# Patient Record
Sex: Female | Born: 1950 | Race: Black or African American | Hispanic: No | Smoking: Never smoker
Health system: Southern US, Community
[De-identification: ages and names within clinical notes are randomized; demographics above are authoritative.]

## PROBLEM LIST (undated history)

## (undated) DIAGNOSIS — I447 Left bundle-branch block, unspecified: Secondary | ICD-10-CM

## (undated) DIAGNOSIS — Z90710 Acquired absence of both cervix and uterus: Secondary | ICD-10-CM

## (undated) DIAGNOSIS — I509 Heart failure, unspecified: Secondary | ICD-10-CM

## (undated) DIAGNOSIS — I10 Essential (primary) hypertension: Secondary | ICD-10-CM

## (undated) HISTORY — DX: Acquired absence of both cervix and uterus: Z90.710

## (undated) HISTORY — DX: Left bundle-branch block, unspecified: I44.7

## (undated) HISTORY — PX: ABDOMINAL HYSTERECTOMY: SHX81

## (undated) HISTORY — PX: TUBAL LIGATION: SHX77

---

## 2001-04-05 ENCOUNTER — Ambulatory Visit (HOSPITAL_COMMUNITY): Admission: RE | Admit: 2001-04-05 | Discharge: 2001-04-05 | Payer: Self-pay | Admitting: *Deleted

## 2001-04-05 ENCOUNTER — Encounter: Payer: Self-pay | Admitting: *Deleted

## 2001-04-08 ENCOUNTER — Encounter: Payer: Self-pay | Admitting: *Deleted

## 2001-04-08 ENCOUNTER — Ambulatory Visit (HOSPITAL_COMMUNITY): Admission: RE | Admit: 2001-04-08 | Discharge: 2001-04-08 | Payer: Self-pay | Admitting: *Deleted

## 2002-01-18 ENCOUNTER — Emergency Department (HOSPITAL_COMMUNITY): Admission: EM | Admit: 2002-01-18 | Discharge: 2002-01-18 | Payer: Self-pay | Admitting: Emergency Medicine

## 2002-08-04 ENCOUNTER — Encounter: Payer: Self-pay | Admitting: *Deleted

## 2002-08-04 ENCOUNTER — Ambulatory Visit (HOSPITAL_COMMUNITY): Admission: RE | Admit: 2002-08-04 | Discharge: 2002-08-04 | Payer: Self-pay | Admitting: *Deleted

## 2003-01-01 ENCOUNTER — Emergency Department (HOSPITAL_COMMUNITY): Admission: EM | Admit: 2003-01-01 | Discharge: 2003-01-01 | Payer: Self-pay | Admitting: Emergency Medicine

## 2003-05-28 ENCOUNTER — Emergency Department (HOSPITAL_COMMUNITY): Admission: EM | Admit: 2003-05-28 | Discharge: 2003-05-28 | Payer: Self-pay

## 2003-10-21 HISTORY — PX: LAPAROSCOPIC CHOLECYSTECTOMY: SUR755

## 2004-02-14 ENCOUNTER — Emergency Department (HOSPITAL_COMMUNITY): Admission: EM | Admit: 2004-02-14 | Discharge: 2004-02-15 | Payer: Self-pay | Admitting: *Deleted

## 2004-06-12 ENCOUNTER — Emergency Department (HOSPITAL_COMMUNITY): Admission: EM | Admit: 2004-06-12 | Discharge: 2004-06-12 | Payer: Self-pay | Admitting: Emergency Medicine

## 2004-06-28 ENCOUNTER — Ambulatory Visit (HOSPITAL_COMMUNITY): Admission: RE | Admit: 2004-06-28 | Discharge: 2004-06-28 | Payer: Self-pay | Admitting: General Surgery

## 2005-01-24 ENCOUNTER — Ambulatory Visit (HOSPITAL_COMMUNITY): Admission: RE | Admit: 2005-01-24 | Discharge: 2005-01-24 | Payer: Self-pay | Admitting: General Surgery

## 2005-04-01 ENCOUNTER — Ambulatory Visit (HOSPITAL_COMMUNITY): Admission: RE | Admit: 2005-04-01 | Discharge: 2005-04-01 | Payer: Self-pay | Admitting: General Surgery

## 2006-06-19 ENCOUNTER — Emergency Department (HOSPITAL_COMMUNITY): Admission: EM | Admit: 2006-06-19 | Discharge: 2006-06-19 | Payer: Self-pay | Admitting: Emergency Medicine

## 2006-12-29 ENCOUNTER — Emergency Department (HOSPITAL_COMMUNITY): Admission: EM | Admit: 2006-12-29 | Discharge: 2006-12-30 | Payer: Self-pay | Admitting: Emergency Medicine

## 2007-10-21 HISTORY — PX: ROTATOR CUFF REPAIR: SHX139

## 2008-02-01 ENCOUNTER — Emergency Department (HOSPITAL_COMMUNITY): Admission: EM | Admit: 2008-02-01 | Discharge: 2008-02-01 | Payer: Self-pay | Admitting: Emergency Medicine

## 2008-02-01 ENCOUNTER — Ambulatory Visit (HOSPITAL_COMMUNITY): Admission: RE | Admit: 2008-02-01 | Discharge: 2008-02-01 | Payer: Self-pay | Admitting: Obstetrics & Gynecology

## 2008-02-14 ENCOUNTER — Ambulatory Visit: Payer: Self-pay | Admitting: Orthopedic Surgery

## 2008-02-23 ENCOUNTER — Encounter: Payer: Self-pay | Admitting: Orthopedic Surgery

## 2008-05-15 ENCOUNTER — Encounter: Payer: Self-pay | Admitting: Orthopedic Surgery

## 2008-05-17 ENCOUNTER — Ambulatory Visit: Payer: Self-pay | Admitting: Orthopedic Surgery

## 2008-05-18 ENCOUNTER — Encounter: Payer: Self-pay | Admitting: Orthopedic Surgery

## 2008-05-19 ENCOUNTER — Encounter: Payer: Self-pay | Admitting: Orthopedic Surgery

## 2008-05-22 ENCOUNTER — Encounter: Payer: Self-pay | Admitting: Orthopedic Surgery

## 2008-06-02 ENCOUNTER — Encounter: Payer: Self-pay | Admitting: Orthopedic Surgery

## 2008-06-14 ENCOUNTER — Ambulatory Visit: Payer: Self-pay | Admitting: Orthopedic Surgery

## 2008-06-20 ENCOUNTER — Telehealth: Payer: Self-pay | Admitting: Orthopedic Surgery

## 2008-06-22 ENCOUNTER — Ambulatory Visit (HOSPITAL_COMMUNITY): Admission: RE | Admit: 2008-06-22 | Discharge: 2008-06-22 | Payer: Self-pay | Admitting: Orthopedic Surgery

## 2008-06-22 ENCOUNTER — Telehealth: Payer: Self-pay | Admitting: Orthopedic Surgery

## 2008-07-05 ENCOUNTER — Ambulatory Visit: Payer: Self-pay | Admitting: Orthopedic Surgery

## 2008-07-11 ENCOUNTER — Ambulatory Visit: Payer: Self-pay | Admitting: Orthopedic Surgery

## 2008-07-11 ENCOUNTER — Ambulatory Visit (HOSPITAL_COMMUNITY): Admission: RE | Admit: 2008-07-11 | Discharge: 2008-07-11 | Payer: Self-pay | Admitting: Orthopedic Surgery

## 2008-07-13 ENCOUNTER — Ambulatory Visit: Payer: Self-pay | Admitting: Orthopedic Surgery

## 2008-07-14 ENCOUNTER — Encounter (HOSPITAL_COMMUNITY): Admission: RE | Admit: 2008-07-14 | Discharge: 2008-07-19 | Payer: Self-pay | Admitting: Orthopedic Surgery

## 2008-07-14 ENCOUNTER — Encounter: Payer: Self-pay | Admitting: Orthopedic Surgery

## 2008-07-21 ENCOUNTER — Encounter (HOSPITAL_COMMUNITY): Admission: RE | Admit: 2008-07-21 | Discharge: 2008-08-20 | Payer: Self-pay | Admitting: Orthopedic Surgery

## 2008-07-24 ENCOUNTER — Encounter: Payer: Self-pay | Admitting: Orthopedic Surgery

## 2008-08-07 ENCOUNTER — Ambulatory Visit: Payer: Self-pay | Admitting: Orthopedic Surgery

## 2008-08-09 ENCOUNTER — Encounter: Payer: Self-pay | Admitting: Orthopedic Surgery

## 2008-09-11 ENCOUNTER — Ambulatory Visit: Payer: Self-pay | Admitting: Orthopedic Surgery

## 2008-10-11 ENCOUNTER — Ambulatory Visit: Payer: Self-pay | Admitting: Orthopedic Surgery

## 2008-10-26 ENCOUNTER — Encounter: Payer: Self-pay | Admitting: Orthopedic Surgery

## 2008-11-01 ENCOUNTER — Encounter: Payer: Self-pay | Admitting: Orthopedic Surgery

## 2009-08-03 ENCOUNTER — Ambulatory Visit (HOSPITAL_COMMUNITY): Admission: RE | Admit: 2009-08-03 | Discharge: 2009-08-03 | Payer: Self-pay | Admitting: Family Medicine

## 2010-05-29 ENCOUNTER — Encounter (HOSPITAL_COMMUNITY): Admission: RE | Admit: 2010-05-29 | Discharge: 2010-05-30 | Payer: Self-pay | Admitting: Family Medicine

## 2010-11-14 ENCOUNTER — Emergency Department (HOSPITAL_COMMUNITY)
Admission: EM | Admit: 2010-11-14 | Discharge: 2010-11-14 | Payer: Self-pay | Source: Home / Self Care | Admitting: Emergency Medicine

## 2010-11-14 ENCOUNTER — Encounter (INDEPENDENT_AMBULATORY_CARE_PROVIDER_SITE_OTHER): Payer: Self-pay | Admitting: *Deleted

## 2010-11-14 LAB — URINALYSIS, ROUTINE W REFLEX MICROSCOPIC
Bilirubin Urine: NEGATIVE
Ketones, ur: NEGATIVE mg/dL
Urobilinogen, UA: 0.2 mg/dL (ref 0.0–1.0)

## 2010-11-14 LAB — CONVERTED CEMR LAB
CK-MB: 1.8 ng/mL
Chloride: 103 meq/L
Creatinine, Ser: 0.88 mg/dL
Eosinophils Absolute: 1 10*3/uL
Glomerular Filtration Rate, Af Am: >60 mL/min/{1.73_m2}
Glucose, Bld: 103 mg/dL
Hemoglobin: 13.3 g/dL
Lymphocytes Relative: 26 %
Lymphs Abs: 3.3 10*3/uL
MCV: 82.1 fL
Monocytes Absolute: 0.7 10*3/uL
Neutro Abs: 8.6 10*3/uL
Sodium: 139 meq/L
Troponin I: <0.05 ng/mL

## 2010-11-14 LAB — CBC
Platelets: 255 10*3/uL (ref 150–400)
RDW: 13 % (ref 11.5–15.5)

## 2010-11-14 LAB — POCT CARDIAC MARKERS
Myoglobin, poc: 83.3 ng/mL (ref 12–200)
Troponin i, poc: <0.05 ng/mL (ref 0.00–0.09)
Troponin i, poc: <0.05 ng/mL (ref 0.00–0.09)

## 2010-11-14 LAB — DIFFERENTIAL
Basophils Absolute: 0 10*3/uL (ref 0.0–0.1)
Basophils Relative: 0 % (ref 0–1)
Eosinophils Relative: 1 % (ref 0–5)
Lymphocytes Relative: 26 % (ref 12–46)
Lymphs Abs: 3.3 10*3/uL (ref 0.7–4.0)
Monocytes Absolute: 0.7 10*3/uL (ref 0.1–1.0)
Monocytes Relative: 5 % (ref 3–12)
Neutro Abs: 8.6 10*3/uL — ABNORMAL HIGH (ref 1.7–7.7)

## 2010-11-14 LAB — BASIC METABOLIC PANEL
CO2: 29 mEq/L (ref 19–32)
Calcium: 9.9 mg/dL (ref 8.4–10.5)
Chloride: 103 mEq/L (ref 96–112)
GFR calc Af Amer: >60 mL/min (ref >60)
Sodium: 139 mEq/L (ref 135–145)

## 2010-12-02 ENCOUNTER — Encounter (INDEPENDENT_AMBULATORY_CARE_PROVIDER_SITE_OTHER): Payer: Self-pay | Admitting: *Deleted

## 2010-12-03 ENCOUNTER — Encounter (INDEPENDENT_AMBULATORY_CARE_PROVIDER_SITE_OTHER): Payer: No Typology Code available for payment source | Admitting: Cardiology

## 2010-12-03 ENCOUNTER — Encounter: Payer: Self-pay | Admitting: Cardiology

## 2010-12-03 DIAGNOSIS — R079 Chest pain, unspecified: Secondary | ICD-10-CM

## 2010-12-03 DIAGNOSIS — G43909 Migraine, unspecified, not intractable, without status migrainosus: Secondary | ICD-10-CM | POA: Insufficient documentation

## 2010-12-04 ENCOUNTER — Encounter: Payer: Self-pay | Admitting: Cardiology

## 2010-12-11 NOTE — Assessment & Plan Note (Addendum)
Summary: [facility]   Visit Type:  Initial Consult Referring Provider:  Dr. Ephriam Knuckles Primary Provider:  [facility]   History of Present Illness: [name] is evaluated in the office today following a recent emergency department visit for chest discomfort.  She first was evaluated by her primary care physician for this problem approximately 4 months ago.  Rib films reportedly demonstrated the presence of fractures in 3 ribs, and conservative therapy was advised.  Pain continued to recur prompting a return visit to the office in January at which time she was referred to the emergency department.  Her discomfort was moderate in intensity, located over the left costal margin, unrelated to movement or exertion and associated with mild nausea.  Chest x-ray there showed no abnormalities.  Cardiac markers were negative.  Basic laboratory studies were unremarkable.  EKG showed left bundle branch block with no prior tracings for comparison.  Patient's psychological state was reviewed with her.  She reports some anhedonia.  She has no friends, attends religious services but does not mingle with others at her church, but denies decreased appetite, change in weight or a sleep disturbance.  She lives with an adult son, but does not see much of him.    Current Medications (verified): 1)  Lisinopril 40 Mg Tabs (Lisinopril) .... Take 1 Tab Daily 2)  Complete Womens  Tabs (Multiple Vitamins-Minerals) .... Take As Directed 3)  Fish Oil 300 Mg Caps (Omega-3 Fatty Acids) .... Take As Directed 4)  Folic Acid 1 Mg Tabs (Folic Acid) .... Take As Directed  Allergies (verified): No Known Drug Allergies  Comments:  Nurse/Medical Assistant: patient brought her lisinopril 40mg  daily all others are vit supplements that she doesnt take everyday [facility]  Past History:  Family History: Last updated: 12-17-2010 Father died at age 80 due to myocardial infarction Mother  died at age 81 due to CVA Siblings: Of 3 brothers, 2 are alive and one died as the result of an MI at age 53; 59 sisters, one died in childhood due to leukemia  Social History: Last updated: 17-Dec-2010 Patient is divorced. 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  Alcohol Use - no Regular Exercise - no Drug Use - no  Past Medical History: Chest pain: Left bundle branch block Hypertension Migraine headache  Past Surgical History: Laparoscopic cholecystectomy-2005 Hysterectomy Arthroscopic rotator cuff repair-2009  CT Scan  Procedure date:  01/2004  Findings:       CT OF THE ABDOMEN AND PELVIS - WITH CONTRAST - 02/15/04   IMPRESSION   1.   Cholelithiasis.  No CT evidence for acute cholecystitis.   2.   Scarring in the upper pole of the right kidney.  Small bilateral   renal cysts.   3.   Normal appearing appendix.   CT PELVIS:   The uterus is surgically absent.  The colon and the small bowel are   unremarkable.  The distal colon is decompressed.  There is no   significant lymphadenopathy.  There is no free fluid.   IMPRESSION   Normal CT of the pelvis, status post hysterectomy.   EKG  Procedure date:  12-17-10  Findings:      Normal sinus rhythm Left bundle branch block No change when compared to a previous tracing on 11/14/10   Family History: Father died at age 10 due to myocardial infarction Mother died at age 73 due to CVA Siblings: Of 3 brothers, 2 are alive and  one died as the result of an MI at age 58; 7 sisters, one died in childhood due to leukemia  Social History: Patient is divorced. 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  Alcohol Use - no Regular Exercise - no Drug Use - no  Review of Systems       migraine headache; corrective lenses required; occasional palpitations; arthritic discomfort in the hands and shoulders; all other systems reviewed and are negative.  Vital Signs:  Patient  profile:   60 year old female Height:      64 inches Weight:      147 pounds BMI:     25.32 O2 Sat:      97 % on Room air Pulse rate:   77 / minute BP sitting:   132 / 71  (left arm)  Vitals Entered By: Dreama Saa, CNA (December 03, 2010 1:58 PM)  O2 Flow:  Room air  Physical Exam  General:  Proportionate weight and height; well-developed; no acute distress: HEENT-[place]/AT; PERRL; EOM intact; conjunctiva and lids nl:  Neck-No JVD; no carotid bruits; no masses; no adenopathy Endocrine-No thyromegaly: Lungs-No tachypnea, clear without rales, rhonchi or wheezes: CV-normal PMI; normal S1 and S2; modest systolic ejection murmur Abdomen-BS normal; soft and non-tender without masses or organomegaly: MS-No deformities, cyanosis or clubbing: Neurologic-Nl cranial nerves; symmetric strength and tone: Skin- Warm, no sig. lesions: Extremities-Nl distal pulses; no edema    Impression & Recommendations:  Problem # 1:  CHEST PAIN (ICD-786.50) Chest pain is noncardiac, and the likelihood of coronary disease appears low.  The initial diagnosis of spontaneous fractures of multiple ribs also appears to be unlikely.  Films and results of prior testing performed at [facility] have been requested.  I will reassess this nice woman in one month in light of that information to determine if further testing is warranted.  Problem # 2:  Possible Depression History is suggestive, but not diagnostic.  Ms. Rubel appears to have inadequate social contact, and additional attention to her psychologic status may be productive.  Patient Instructions: 1)  Your physician recommends that you schedule a follow-up appointment in: 1 month  Appended Document: [facility] Records    Clinical Lists Changes  Problems: Added new problem of LEFT BUNDLE BRANCH BLOCK (ICD-426.3) Observations: Added new observation of PAST MED HX: Chest pain: Left bundle branch block-diagnosed  10/2010 Hypertension Migraine headache (12/16/2010 18:25) Added new observation of SOCIAL HX: Patient is divorced. 3 children Employment-clerk for the post [employer] Tobacco Use - Never  Alcohol Use - no Regular Exercise - no Drug Use - no  (12/16/2010 18:25) Added new observation of FAMILY HX: Father died at age 75 due to myocardial infarction Mother died at age 81 due to CVA; h/o hypertension Siblings: Of 3 brothers, 2 are alive and one died as the result of an MI at age 23; 53 sisters, one died in childhood due to leukemia  (12/16/2010 18:25) Added new observation of PAST SURG HX: Laparoscopic cholecystectomy-2005 Bilateral tubal ligation Hysterectomy Arthroscopic rotator cuff repair-2009 (12/16/2010 18:25) Added new observation of REFERRING MD: Dr. Ephriam Knuckles (12/16/2010 18:25) Added new observation of PRIMARY MD: [facility] (12/16/2010 18:25) Added new observation of SGPT (ALT): 29 units/L (05/21/2010 18:31) Added new observation of BG FASTING: 119 mg/dL (30/86/5784 69:62) Added new observation of TSH: 0.35 microintl units/mL (05/21/2010 18:31) Added new observation of HGBA1C: 6.2 % (05/21/2010 18:31) Added new observation of TRIGLYCERIDE: 96 mg/dL (95/28/4132  18:31) Added new observation of HDL: 50 mg/dL (04/54/0981 19:14) Added new observation of LDL (CALCUL): 172 mg/dL (78/29/5621 30:86) Added new observation of CHOLESTEROL: 241 mg/dL (57/84/6962 95:28) Added new observation of GFR: 72 mL/min (05/21/2010 18:27) Added new observation of ALBUMIN: 4.6 g/dL (41/32/4401 02:72) Added new observation of PROTEIN, TOT: 7.5 g/dL (53/66/4403 47:42) Added new observation of CALCIUM: 10.1 mg/dL (59/56/3875 64:33) Added new observation of ALK PHOS: 86 units/L (05/21/2010 18:27) Added new observation of BILI TOTAL: 0.5 mg/dL (29/51/8841 66:06) Added new observation of SGOT (AST): 22 units/L (05/21/2010 18:27) Added new observation of CO2 PLSM/SER: 24 meq/L (05/21/2010  18:27) Added new observation of CL SERUM: 106 meq/L (05/21/2010 18:27) Added new observation of K SERUM: 4.6 meq/L (05/21/2010 18:27) Added new observation of NA: 144 meq/L (05/21/2010 18:27) Added new observation of CREATININE: 0.88 mg/dL (30/16/0109 32:35) Added new observation of BUN:  13 mg/dL (57/32/2025 42:70)        -  Date:  05/21/2010    SGOT (AST): 22    T. Bilirubin: 0.5    Alk Phos: 86    Calcium: 10.1    Total Protein: 7.5    Albumin: 4.6    Cholesterol: 241    LDL-calculated: 172    HDL: 50    Triglycerides: 96    HgbA1c: 6.2    TSH: 0.35    FBS 119    SGPT (ALT): 29   Past History:  Past Medical History: Chest pain: Left bundle branch block-diagnosed 10/2010 Hypertension Migraine headache  Past Surgical History: Laparoscopic cholecystectomy-2005 Bilateral tubal ligation Hysterectomy Arthroscopic rotator cuff repair-2009    Family History: Father died at age 23 due to myocardial infarction Mother died at age 50 due to CVA; h/o hypertension Siblings: Of 3 brothers, 2 are alive and one died as the result of an MI at age 67; 7 sisters, one died in childhood due to leukemia  Social History: Patient is divorced. 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

## 2010-12-11 NOTE — Miscellaneous (Signed)
Summary: labs cbcd,bmp,11/14/2010  Clinical Lists Changes  Observations: Added new observation of TROPONIN I: <0.05 (11/14/2010 9:47) Added new observation of CK-MB ISOENZ: 1.8 ng/mL (11/14/2010 9:47) Added new observation of GFR AA: >60 mL/min/1.84m2 (11/14/2010 9:47) Added new observation of GFR: >60 mL/min (11/14/2010 9:47) Added new observation of CREATININE: 0.88 mg/dL (16/07/9603 5:40) Added new observation of BUN: 10 mg/dL (98/08/9146 8:29) Added new observation of BG RANDOM: 103 mg/dL (56/21/3086 5:78) Added new observation of CO2 PLSM/SER: 29 meq/L (11/14/2010 9:47) Added new observation of CL SERUM: 103 meq/L (11/14/2010 9:47) Added new observation of K SERUM: 3.9 meq/L (11/14/2010 9:47) Added new observation of NA: 139 meq/L (11/14/2010 9:47) Added new observation of ABSOLUTE BAS: 0.0 K/uL (11/14/2010 9:47) Added new observation of BASOPHIL %: 0 % (11/14/2010 9:47) Added new observation of EOS ABSLT: 1 K/uL (11/14/2010 9:47) Added new observation of ABSOLUTE MON: 0.7 K/uL (11/14/2010 9:47) Added new observation of MONOCYTE %: 5 % (11/14/2010 9:47) Added new observation of ABS LYMPHOCY: 3.3 K/uL (11/14/2010 9:47) Added new observation of LYMPHS %: 26 % (11/14/2010 9:47) Added new observation of ABS NEUTROPH: 8.6 K/uL (11/14/2010 9:47) Added new observation of PLATELETK/UL: 256 K/uL (11/14/2010 9:47) Added new observation of RDW: 13.0 % (11/14/2010 9:47) Added new observation of MCHC RBC: 34.9 g/dL (46/96/2952 8:41) Added new observation of MCV: 82.1 fL (11/14/2010 9:47) Added new observation of HCT: 38.1 % (11/14/2010 9:47) Added new observation of HGB: 13.3 g/dL (32/44/0102 7:25) Added new observation of RBC M/UL: 4.64 M/uL (11/14/2010 9:47) Added new observation of WBC COUNT: 12.7 10*3/microliter (11/14/2010 9:47)

## 2010-12-16 DIAGNOSIS — I447 Left bundle-branch block, unspecified: Secondary | ICD-10-CM | POA: Insufficient documentation

## 2010-12-18 ENCOUNTER — Encounter: Payer: Self-pay | Admitting: Cardiology

## 2011-01-02 ENCOUNTER — Encounter: Payer: Self-pay | Admitting: *Deleted

## 2011-01-02 ENCOUNTER — Other Ambulatory Visit: Payer: Self-pay | Admitting: Cardiology

## 2011-01-02 ENCOUNTER — Ambulatory Visit (INDEPENDENT_AMBULATORY_CARE_PROVIDER_SITE_OTHER): Payer: No Typology Code available for payment source | Admitting: Cardiology

## 2011-01-02 ENCOUNTER — Encounter: Payer: Self-pay | Admitting: Cardiology

## 2011-01-02 DIAGNOSIS — R079 Chest pain, unspecified: Secondary | ICD-10-CM

## 2011-01-02 DIAGNOSIS — R0602 Shortness of breath: Secondary | ICD-10-CM

## 2011-01-06 ENCOUNTER — Ambulatory Visit (HOSPITAL_COMMUNITY)
Admission: RE | Admit: 2011-01-06 | Discharge: 2011-01-06 | Disposition: A | Discharge status: Home / Self Care | Payer: No Typology Code available for payment source | Source: Ambulatory Visit | Attending: Cardiology | Admitting: Cardiology

## 2011-01-06 ENCOUNTER — Encounter (HOSPITAL_COMMUNITY): Payer: Self-pay

## 2011-01-06 ENCOUNTER — Encounter (HOSPITAL_COMMUNITY): Payer: No Typology Code available for payment source

## 2011-01-06 ENCOUNTER — Ambulatory Visit (HOSPITAL_COMMUNITY): Payer: No Typology Code available for payment source

## 2011-01-06 DIAGNOSIS — R0602 Shortness of breath: Secondary | ICD-10-CM | POA: Insufficient documentation

## 2011-01-06 DIAGNOSIS — E079 Disorder of thyroid, unspecified: Secondary | ICD-10-CM | POA: Insufficient documentation

## 2011-01-06 HISTORY — DX: Essential (primary) hypertension: I10

## 2011-01-06 MED ORDER — IOHEXOL 350 MG/ML SOLN
100.0000 mL | Freq: Once | INTRAVENOUS | Status: AC | PRN
  Administered 2011-01-06: 100 mL via INTRAVENOUS

## 2011-01-07 NOTE — Letter (Signed)
Summary: Work Writer at [employer]  [address] [place] [address]   Phone: [phone]  Fax: [phone]     January 02, 2011    Summer Christensen   The above named patient had a medical visit today at: 1:30     am   Please take this into consideration when reviewing the time away from work. Pt is scheduled for extensive testing on Monday 01/06/2011, she needs must have 12/19/2010 off to have testing done on 01/06/2011.    Sincerely yours,  Architectural technologist

## 2011-01-07 NOTE — Letter (Signed)
Summary: Stress Echocardiogram Information Sheet  [facility] at [facility]  [address] [place] [address]   Phone: [phone]  Fax: [phone]      January 02, 2011 MRN: [id number] light prior to the test.   Deidre Loiselle  Doctor: Appointment Date: Appointment Time: Appointment Location: [facility]  Stress Echocardiogram Information Sheet    Instructions:   1. DO NOT  take your AM medicine MORNING days before the test.  2. NOTHING TO EAT OR DRINK AFTER MIDNIGHT  3. Dress prepared to exercise.  4. DO NOT use ANY caffine or tobacco products 3 hours before appointment.  5. Report to the Short Stay Center on the1st floor.  6. Please bring all current prescription medications.  7. If you have any questions, please call [phone]

## 2011-01-08 ENCOUNTER — Ambulatory Visit (HOSPITAL_COMMUNITY)
Admission: RE | Admit: 2011-01-08 | Discharge: 2011-01-08 | Disposition: A | Discharge status: Home / Self Care | Payer: No Typology Code available for payment source | Source: Ambulatory Visit | Attending: Cardiology | Admitting: Cardiology

## 2011-01-08 ENCOUNTER — Ambulatory Visit (HOSPITAL_COMMUNITY): Payer: No Typology Code available for payment source

## 2011-01-08 ENCOUNTER — Other Ambulatory Visit: Payer: Self-pay | Admitting: Cardiology

## 2011-01-08 DIAGNOSIS — R079 Chest pain, unspecified: Secondary | ICD-10-CM | POA: Insufficient documentation

## 2011-01-08 DIAGNOSIS — R072 Precordial pain: Secondary | ICD-10-CM

## 2011-01-08 DIAGNOSIS — I10 Essential (primary) hypertension: Secondary | ICD-10-CM | POA: Insufficient documentation

## 2011-01-16 NOTE — Assessment & Plan Note (Signed)
Summary: 1 mth f/u per checkout on 12/03/10/tg   Visit Type:  Follow-up Referring Provider:  Dr. Ephriam Knuckles Primary Provider:  [facility]   History of Present Illness: [name] returns to the office as scheduled for continued assessment and treatment of chest discomfort.  Repeat rib films show no evidence of fracture; however, she has continued to have discomfort.  This is of mild to moderate severity with occasional sharp components affecting both costal margins and occurring on most days.  There is no relationship to exercise.  The discomfort is not pleuritic.  [name] also reports the recent development of exertional dyspnea.  She is unable to do her housework without having to rest due to air hunger.  She has not been a cigarette smoker and has no history of lung disease.  Current Medications (verified): 1)  Lisinopril 40 Mg Tabs (Lisinopril) .... Take 1 Tab Daily 2)  Complete Womens  Tabs (Multiple Vitamins-Minerals) .... Take As Directed 3)  Fish Oil 300 Mg Caps (Omega-3 Fatty Acids) .... Take As Directed 4)  Folic Acid 1 Mg Tabs (Folic Acid) .... Take As Directed 5)  Naprosyn 375 Mg Tabs (Naproxen) .... Take 1 Tablet By Mouth Two Times A Day  Allergies (verified): No Known Drug Allergies  Comments:  Nurse/Medical Assistant: no meds no list we reviewed from prevous ov pharmacy [facility]   Past History:  PMH, FH, and Social History reviewed and updated.  Review of Systems       See history of present illness.  Vital Signs:  Patient profile:   60 year old female Weight:      147 pounds BMI:     25.32 O2 Sat:      97 % on Room air Pulse rate:   79 / minute BP sitting:   119 / 74  (left arm)  Vitals Entered By: Dreama Saa, CNA (January 02, 2011 10:57 AM)  O2 Flow:  Room air  Physical Exam  General:  Proportionate weight and height; well-developed; no acute distress: HEENT-[place]/AT; PERRL; EOM intact; conjunctiva and lids nl:  Neck-No  JVD; no carotid bruits; no masses; no adenopathy Endocrine-No thyromegaly: Lungs-clear to auscultation and resonant to percussion Chest: No tenderness to palpation over the costal margins. CV-normal PMI; normal S1 and S2; modest systolic ejection murmur Abdomen-BS normal; soft and non-tender without masses or organomegaly: MS-No deformities, cyanosis or clubbing: Neurologic-Nl cranial nerves; symmetric strength and tone: Skin- Warm, no sig. lesions: Extremities-Nl distal pulses; no edema    Impression & Recommendations:  Problem # 1:  CHEST PAIN (ICD-786.50) Patient's discomfort continues and continues to be noncardiac in quality, location and pattern.  A musculoskeletal etiology is most likely.  Accordingly, a trial of naproxen 375 mg b.i.d. will be instituted.  Problem # 2:  DYSPNEA (ICD-786.05) Dyspnea is more prominent by the patient's history at this visit.  Neither her examination or chest x-ray suggests the presence of lung disease.  Cardiac disease remains unlikely, but has not been excluded.  A rate related left bundle-branch block sometimes causes exertional symptoms, but I doubt that that is the issue in [name] case.  Further assessment will be with a stress echocardiogram and CT of the chest with contrast.  TSH, BNP level and d-dimer will also be obtained.  I will reassess this nice woman in 4 weeks.  Other Orders: T-Basic Metabolic Panel [phone]) T-TSH [phone]) T-BNP  (B Natriuretic Peptide) [phone]) T-D-Dimer Fibrin Derivatives Quantitive [phone]) Stress Echo (Stress Echo)  CT with Contrast (CT w/ contrast)  Patient Instructions: 1)  Your physician recommends that you schedule a follow-up appointment in: 1 MONTH 2)  Your physician recommends that you return for lab work in: TODAY 3)  Your physician has recommended you make the following change in your medication:  NAPROXAN 375MG  TWICE DAILY 4)  Non-Cardiac CT scanning, (CAT scanning), is a  noninvasive, special x-ray that produces cross-sectional images of the body using x-rays and a computer. CT scans help physicians diagnose and treat medical conditions. For some CT exams, a contrast material is used to enhance visibility in the area of the body being studied. CT scans provide greater clarity and reveal more details than regular x-ray exams. 5)  Your physician has requested that you have a stress echocardiogram. For further information please visit [email or web address]  Please follow instruction sheet as given. Prescriptions: NAPROSYN 375 MG TABS (NAPROXEN) Take 1 tablet by mouth two times a day  #60 x 3   Entered by:   Teressa Lower RN   Authorized by:   Kathlen Brunswick, MD, [facility]   Signed by:   Teressa Lower RN on 01/02/2011   Method used:   Electronically to        [facility] (retail)       [address]       [place], [place]  [address]       Ph: [phone]       Fax: [phone]   RxID:   [id number]

## 2011-02-06 ENCOUNTER — Encounter: Payer: Self-pay | Admitting: Cardiology

## 2011-02-06 ENCOUNTER — Ambulatory Visit (INDEPENDENT_AMBULATORY_CARE_PROVIDER_SITE_OTHER): Payer: No Typology Code available for payment source | Admitting: Cardiology

## 2011-02-06 VITALS — BP 126/78 | HR 81 | Ht 66.0 in | Wt 145.0 lb

## 2011-02-06 DIAGNOSIS — R079 Chest pain, unspecified: Secondary | ICD-10-CM

## 2011-02-06 DIAGNOSIS — I447 Left bundle-branch block, unspecified: Secondary | ICD-10-CM

## 2011-02-06 DIAGNOSIS — I10 Essential (primary) hypertension: Secondary | ICD-10-CM | POA: Insufficient documentation

## 2011-02-06 NOTE — Assessment & Plan Note (Addendum)
No apparent serious etiology for her chest discomfort.  It may be related to chronic depression.  Since patient will not accept a referral to [facility], I suggested that she return to her primary care practitioner for consideration of initiation of antidepressant medications.   Basic laboratory studies were not helpful with  normal fasting glucose, normal renal function, normal electrolytes.  BNP level was requested, but not reported.  I will be happy to reassess his lites will be checked she develop any additional problems with which I can assist.

## 2011-02-06 NOTE — Assessment & Plan Note (Signed)
I believe that this is an incidental finding and is likely unrelated to her current symptoms.

## 2011-02-06 NOTE — Assessment & Plan Note (Signed)
Blood pressure control is excellent; current medications will be continued.  O. Patient believes that she is overweight, there appears to be no significant role for weight loss and control of her hypertension.

## 2011-02-06 NOTE — Patient Instructions (Signed)
Your physician recommends that you schedule a follow-up appointment in: AS NEEDED  Your physician has recommended you make the following change in your medication: CAN CONTINUE TO USE NAPROSYN AND TYLENOL FOR PAIN REFERRAL TO DR. Tedd Sias [phone]

## 2011-02-06 NOTE — Progress Notes (Signed)
HPI : Summer Christensen returns to the office as scheduled for continued assessment and treatment of chest discomfort, exertional dyspnea and lightheadedness.  All of these symptoms have persisted, although the dyspnea may be somewhat less prominent.  A stress echocardiogram showed normal cardiac anatomy and function with a normal response to exercise.  Exercise tolerance was poor, and stress EKG was negative.  She is continued to have intermittent sharp chest discomfort at the left costal margin that is not debilitating, has no associated symptoms and occurs unpredictably.  Naprosyn has not been of benefit.  She denies orthopnea, PND or syncope.  She has no history of chronic lung problems.  CT of the chest was normal except for cystic changes of the thyroid and thyromegaly.  Summer Christensen has previously undergone evaluation by an endocrinologist at [facility], but missed an appointment for a recommend thyroid biopsy.  Her emotional status was discussed.  She feels depressed and has considered suicide in the past.  She is concerned about taking antidepressants because of possible adverse effects.  She gave some consideration to seeing a behavioral health practitioner, but ultimately deferred a decision as to whether she wishes to proceed with that.  Current Outpatient Prescriptions on File Prior to Visit  Medication Sig Dispense Refill  . folic acid (FOLVITE) 1 MG tablet Take 1 mg by mouth daily.        Marland Kitchen lisinopril (PRINIVIL,ZESTRIL) 40 MG tablet Take 40 mg by mouth daily.        . Multiple Vitamins-Minerals (WOMENS MULTI VITAMIN & MINERAL) TABS Take 1 tablet by mouth daily.        . naproxen (NAPROSYN) 375 MG tablet Take 375 mg by mouth 2 (two) times daily with a meal.        . Omega-3 Fatty Acids (FISH OIL) 300 MG CAPS Take 1 capsule by mouth daily.           No Known Allergies    Past medical history, social history, and family history reviewed and updated.  ROS: See history of present  illness.  BP 126/78  Pulse 81  Ht 5\' 6"  (1.676 m)  Wt 145 lb (65.772 kg)  BMI 23.40 kg/m2  SpO2 95% ; no significant orthostatic change in blood pressure.   PHYSICAL EXAM: General-Well developed; no acute distress; depressed affect Body habitus-proportionate weight and height Neck-No JVD; no carotid bruits Lungs-clear lung fields; resonant to percussion Cardiovascular-normal PMI; normal S1 and S2; regular rhythm; modest systolic ejection murmur. Abdomen-normal bowel sounds; soft and non-tender without masses or organomegaly Musculoskeletal-No deformities, no cyanosis or clubbing Neurologic-Normal cranial nerves; symmetric strength and tone Skin-Warm, no significant lesions Extremities-distal pulses intact; no edema  ASSESSMENT AND PLAN:

## 2011-03-04 NOTE — Op Note (Signed)
NAME:  Summer Christensen, Summer Christensen NO.:  [id number]   MEDICAL RECORD NO.:  [id number]          PATIENT TYPE:  AMB   LOCATION:  DAY                           FACILITY:  [facility]   PHYSICIAN:  Vickki Hearing, M.D.DATE OF BIRTH:  [birth year]   DATE OF PROCEDURE:  07/11/2008  DATE OF DISCHARGE:                               OPERATIVE REPORT   HISTORY:  Summer Christensen is a 60 year old right-hand-dominant female post OR  who presented in April 2009 with history of pain in her left shoulder  for 4 weeks.  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  She was treated with physical therapy, subacromial injection,  ibuprofen, Naprosyn, Relafen, and work site modifications did not  improve, and she was eventually sent for an MRI.  Her MRI showed  subdeltoid bursitis, tendinopathy in the distal portions of the  supraspinatus and infraspinatus.  There was a rim-rent tear of the  infraspinatus with no extension to the bursal or articular surfaces.   Other treatment included pain medication using hydrocodone 5 mg, her  pain did not improve.  She was having difficulty at work and having pain  at night as well.  It was suggested at that point that she have surgical  treatment after 5 months of nonoperative treatment and no improvement in  symptoms.   PREOPERATIVE DIAGNOSIS:  Torn rotator cuff, left shoulder.   POSTOPERATIVE DIAGNOSIS:  Bursitis, left shoulder.   PROCEDURE:  Arthroscopy, left shoulder with bursectomy, left shoulder.   SURGEON:  Vickki Hearing, MD assisted by [facility].   ANESTHESIA:  General.   FINDINGS:  There was mild bursitis in the subacromial space.  There was  abrasive changes on the infraspinatus.  There was no evidence of cuff  tear.  Intra-articular structures were normal including the labrum,  biceps, anchor biceps, tendon, humeral head, and undersurface of the  cuff.   PROCEDURE:  Summer Christensen was identified in the preop holding area.   Proper  site marking was performed by the patient and surgeon.  The patient was  given Ancef 1 g IV, taken to Surgery for general anesthesia, placed in  the beach-chair position with a Schlein positioner.  Left shoulder was  prepped and draped using sterile technique in DuraPrep.   Time-out procedure was completed and brief exam under anesthesia  produced no evidence of instability.   Posterior portal was established.  Diagnostic arthroscopy was performed.  Specific attention was paid to the biceps, anchor, the labrum, and the  undersurface of the rotator cuff.  There was complete continuity of the  cuff on the articular side.   We then placed the scope in the subacromial space.  There was abrasive  changes in the infraspinatus tendon.  The acromion was flat.  There was  mild bursitis, which was resected.   The shoulder was taken through a range of motion.  There was no evidence  of tear.  The shoulder was brought into abduction and there was no  evidence of impingement of the acromion against the infraspinatus.   The shoulder was then  irrigated and closed with 3-0 Prolene suture,  subacromial space injected with 30 mL of Marcaine.   Cryo-Cuff was applied after the dressings.  The patient was taken to  recovery room after extubation in stable condition.   The patient will undergo physical therapy again.  She will be discharged  with medication using ibuprofen 800 mg, Lorcet Plus 7.5 mg, and Flexeril  5 mg.  Follow up will be in 2 days and therapy can start on Friday.      Vickki Hearing, M.D.  Electronically Signed     SEH/MEDQ  D:  07/11/2008  T:  07/12/2008  Job:  [id number]

## 2011-03-07 NOTE — H&P (Signed)
NAME:  Summer Christensen, Summer Christensen                 ACCOUNT NO.:  [id number]   MEDICAL RECORD NO.:  [id number]         PATIENT TYPE:  AMB   LOCATION:                                FACILITY:  [facility]   PHYSICIAN:  Dalia Heading, M.D.  DATE OF BIRTH:  1951/10/11   DATE OF ADMISSION:  DATE OF DISCHARGE:  LH                                HISTORY & PHYSICAL   CHIEF COMPLAINT:  Abdominal pain of unknown etiology.   HISTORY OF PRESENT ILLNESS:  The patient is a 60 year old black female who  presents with both left upper quadrant and right lower quadrant abdominal  pain.  They are crampy in nature.  They have been occurring for many months.  She denies any hematochezia, melena, diarrhea, or constipation.  She does  complain of excess flatus.  She has never had a colonoscopy.  There is no  family history of colon carcinoma.   PAST MEDICAL HISTORY:  Migraine headaches.   PAST SURGICAL HISTORY:  1.  Hysterectomy.  2.  Laparoscopic cholecystectomy.   CURRENT MEDICATIONS:  Vicodin for migraine headaches.   ALLERGIES:  No known drug allergies.   REVIEW OF SYSTEMS:  Noncontributory.   PHYSICAL EXAMINATION:  GENERAL:  The patient is a well-developed and well-  nourished black female, in no acute distress.  VITAL SIGNS:  The patient is afebrile and vital signs are stable.  LUNGS:  Clear to auscultation with equal breath sounds bilaterally.  HEART:  A regular rate and rhythm without S3, S4, or murmurs.  ABDOMEN:  Soft, nontender, nondistended.  No hepatosplenomegaly or masses  are noted.  RECTAL:  Deferred until the procedure.   IMPRESSION:  Abdomina pain of unknown etiology.   PLAN:  The patient is scheduled for a colonoscopy on January 24, 2005.  The  risks and benefits of the procedure, including bleeding and perforation were  fully explained to the patient, who gave an informed consent.      MAJ/MEDQ  D:  01/21/2005  T:  01/21/2005  Job:  [id number]

## 2011-03-07 NOTE — Procedures (Signed)
NAME:  Summer Christensen, Summer Christensen NO.:  [id number]   MEDICAL RECORD NO.:  [id number]          PATIENT TYPE:  EMS   LOCATION:  ED                            FACILITY:  [facility]   PHYSICIAN:  Edward L. Juanetta Gosling, M.D.DATE OF BIRTH:  1950/11/17   DATE OF PROCEDURE:  06/19/2006  DATE OF DISCHARGE:  06/19/2006                                EKG INTERPRETATION   DESCRIPTION OF PROCEDURE:  June 19, 2006, 07:43.   INTERPRETATION:  The rhythm is sinus rhythm with a rate in the 70's.  There  is fairly marked diffuse ST-T wave abnormality which could indicate ischemia  or could be related to something like digitalis effect or electrolyte  imbalance, etc.   IMPRESSION:  Abnormal electrocardiogram.      Edward L. Juanetta Gosling, M.D.  Electronically Signed     ELH/MEDQ  D:  06/21/2006  T:  06/22/2006  Job:  [id number]

## 2011-03-07 NOTE — H&P (Signed)
NAME:  Summer Christensen, Summer Christensen                           ACCOUNT NO.:  [id number]   MEDICAL RECORD NO.:  [id number]                   PATIENT TYPE:   LOCATION:                                       FACILITY:   PHYSICIAN:  Dalia Heading, M.D.               DATE OF BIRTH:  [birth year]   DATE OF ADMISSION:  DATE OF DISCHARGE:                                HISTORY & PHYSICAL   CHIEF COMPLAINT:  Biliary colic, cholelithiasis.   HISTORY OF PRESENT ILLNESS:  The patient is a 60 year old black female who  was referred for evaluation and treatment of biliary colic secondary to  cholelithiasis.  She has been having intermittent episodes of right upper  quadrant abdominal pain with radiation to the right flank, nausea, bloating  for many months.  She does have fatty food intolerance.  No fever, chills,  or jaundice have been noted.   PAST MEDICAL HISTORY:  Migraine headaches.   PAST SURGICAL HISTORY:  Hysterectomy.   CURRENT MEDICATIONS:  Vicodin p.r.n. pain.   ALLERGIES:  No known drug allergies.   REVIEW OF SYSTEMS:  Noncontributory.   PHYSICAL EXAMINATION:  GENERAL:  The patient is a well-developed, well-  nourished, black female, in no acute distress.  VITAL SIGNS:  She is afebrile and vital signs are stable.  HEENT:  Reveals no scleral icterus.  LUNGS:  Clear to auscultation with equal breath sounds bilaterally.  HEART:  Reveals a regular rate and rhythm without S3, S4, or murmurs.  ABDOMEN:  Soft with slight tenderness noted in the right upper quadrant to  palpation.  No hepatosplenomegaly, masses, or hernias identified.   CT scan of the abdomen reveals cholelithiasis.   IMPRESSION:  1.  Cholecystitis.  2.  Cholelithiasis.   PLAN:  The patient is scheduled for laparoscopic cholecystectomy on  June 27, 2004.  The risks and benefits of the procedure including  bleeding, infection, hepatobiliary injury, the possibility of an open  procedure were fully explained to the patient,  gave informed consent.     ___________________________________________                                         Dalia Heading, M.D.   MAJ/MEDQ  D:  06/18/2004  T:  06/18/2004  Job:  [id number]   cc:   chart

## 2011-03-07 NOTE — Op Note (Signed)
NAME:  Summer Christensen, Summer Christensen                           ACCOUNT NO.:  [id number]   MEDICAL RECORD NO.:  [id number]                   PATIENT TYPE:  AMB   LOCATION:  DAY                                  FACILITY:  [facility]   PHYSICIAN:  Dalia Heading, M.D.               DATE OF BIRTH:  01/02/1951   DATE OF PROCEDURE:  06/28/2004  DATE OF DISCHARGE:                                 OPERATIVE REPORT   PREOPERATIVE DIAGNOSIS:  Cholecystitis, cholelithiasis.   POSTOPERATIVE DIAGNOSIS:  Cholecystitis, cholelithiasis.   PROCEDURE:  Laparoscopic cholecystectomy.   SURGEON:  Dr. Franky Macho.   ANESTHESIA:  General endotracheal.   INDICATIONS:  The patient is a 60 year old black female who was referred for  evaluation and treatment of  cholecystitis secondary to cholelithiasis. The  risks and benefits of the procedure including bleeding, infection,  hepatobiliary injury, and the possibility of an open procedure were fully  explained to the patient who gave informed consent.   PROCEDURE NOTE:  The patient was placed in the supine position. After  induction of general endotracheal anesthesia, the abdomen was prepped and  draped using the usual sterile technique with Betadine. Surgical site  confirmation was performed.   Supraumbilical incision was made down to the fascia. Verres needle was  introduced into the abdominal cavity, and confirmation of placement was done  using the saline drop test. The abdomen was then insufflated to 16 mmHg  pressure. An 11-mm trocar was introduced into the abdominal cavity under  direct visualization without difficulty. The patient was placed in reversed  Trendelenburg position. An additional 11-mm trocar was placed in the  epigastric region. A 5-mm trocar was placed in the right upper quadrant,  right flank regions. Liver was inspected and noted to be within normal  limits. The gallbladder was retracted superiorly and laterally. The  dissection was begun around the  infundibulum of the gallbladder. The cystic  duct was first identified. Its juncture to the infundibulum identified.  Endoclips were placed proximally and distally on the cystic duct, and the  cystic duct was divided. The cystic artery was likewise ligated and divided.  The gallbladder was freed away from the gallbladder fossa using Bovie  electrocautery. The gallbladder was delivered through the epigastric trocar  site using EndoCatch bag. The gallbladder fossa was inspected, and no  abnormal bleeding or bile leakage was noted. Surgicel was placed in the  gallbladder fossa. All fluid and air was then evacuated from the abdominal  cavity prior to removal of the trocars.   All wound were irrigated with normal saline. All wounds were injected with  0.5% Sensorcaine. The supraumbilical fascia was reapproximated using an 0  Vicryl interrupted suture. All skin incisions were closed using staples.  Betadine ointment and dry sterile dressings were applied.   All tape and needle counts correct at the end of the procedure. The patient  was extubated in the  operating room and went back to recovery room awake in  stable condition.   COMPLICATIONS:  None.   SPECIMENS:  Gallbladder with stones.   BLOOD LOSS:  Minimal.      ___________________________________________                                            Dalia Heading, M.D.   MAJ/MEDQ  D:  06/28/2004  T:  06/28/2004  Job:  [id number]

## 2011-07-21 LAB — CBC
Hemoglobin: 12.9
MCHC: 34.1
RDW: 13.2
WBC: 10.2

## 2011-07-21 LAB — BASIC METABOLIC PANEL
CO2: 27
Creatinine, Ser: 0.89
GFR calc non Af Amer: >60
Glucose, Bld: 82

## 2011-12-21 ENCOUNTER — Encounter (HOSPITAL_COMMUNITY): Payer: Self-pay | Admitting: *Deleted

## 2011-12-21 ENCOUNTER — Emergency Department (HOSPITAL_COMMUNITY)
Admission: EM | Admit: 2011-12-21 | Discharge: 2011-12-21 | Disposition: A | Discharge status: Home / Self Care | Payer: No Typology Code available for payment source | Attending: Emergency Medicine | Admitting: Emergency Medicine

## 2011-12-21 ENCOUNTER — Other Ambulatory Visit: Payer: Self-pay

## 2011-12-21 ENCOUNTER — Emergency Department (HOSPITAL_COMMUNITY): Payer: No Typology Code available for payment source

## 2011-12-21 DIAGNOSIS — J3489 Other specified disorders of nose and nasal sinuses: Secondary | ICD-10-CM | POA: Insufficient documentation

## 2011-12-21 DIAGNOSIS — R059 Cough, unspecified: Secondary | ICD-10-CM | POA: Insufficient documentation

## 2011-12-21 DIAGNOSIS — R05 Cough: Secondary | ICD-10-CM | POA: Insufficient documentation

## 2011-12-21 DIAGNOSIS — Z79899 Other long term (current) drug therapy: Secondary | ICD-10-CM | POA: Insufficient documentation

## 2011-12-21 DIAGNOSIS — R079 Chest pain, unspecified: Secondary | ICD-10-CM | POA: Insufficient documentation

## 2011-12-21 DIAGNOSIS — J189 Pneumonia, unspecified organism: Secondary | ICD-10-CM

## 2011-12-21 DIAGNOSIS — I10 Essential (primary) hypertension: Secondary | ICD-10-CM | POA: Insufficient documentation

## 2011-12-21 LAB — BASIC METABOLIC PANEL
CO2: 27 mEq/L (ref 19–32)
Calcium: 9.7 mg/dL (ref 8.4–10.5)
Creatinine, Ser: 0.85 mg/dL (ref 0.50–1.10)
GFR calc Af Amer: 85 mL/min — ABNORMAL LOW (ref >90)

## 2011-12-21 LAB — CBC
MCV: 83.7 fL (ref 78.0–100.0)
Platelets: 199 10*3/uL (ref 150–400)
RDW: 13.2 % (ref 11.5–15.5)
WBC: 11.4 10*3/uL — ABNORMAL HIGH (ref 4.0–10.5)

## 2011-12-21 MED ORDER — DEXTROSE 5 % IV SOLN
1.0000 g | Freq: Once | INTRAVENOUS | Status: AC
  Administered 2011-12-21: 1 g via INTRAVENOUS
  Filled 2011-12-21: qty 10

## 2011-12-21 MED ORDER — AZITHROMYCIN 250 MG PO TABS: 250.0000 mg | ORAL_TABLET | Freq: Every day | ORAL | Status: AC

## 2011-12-21 NOTE — ED Notes (Signed)
Pt back from x-ray.

## 2011-12-21 NOTE — ED Notes (Signed)
Pt sleeping. 

## 2011-12-21 NOTE — ED Notes (Signed)
Patient transported to X-ray 

## 2011-12-21 NOTE — ED Notes (Signed)
Pt reports tonight she has been coughing, took cough meds and tried to go to sleep, pt reports now she is having pain underneath her breasts

## 2011-12-21 NOTE — ED Provider Notes (Signed)
History     CSN: [id number]  Arrival date & time 12/21/11  0205   First MD Initiated Contact with Patient 12/21/11 [phone]      Chief Complaint  Patient presents with  . Chest Pain    (Consider location/radiation/quality/duration/timing/severity/associated sxs/prior treatment) Patient is a 61 y.o. female presenting with chest pain. The history is provided by the patient.  Chest Pain The chest pain began 1 - 2 hours ago. Duration of episode(s) is 30 minutes. Chest pain occurs intermittently. The chest pain is resolved. The pain is associated with coughing. At its most intense, the pain is at 8/10. The pain is currently at 0/10. The severity of the pain is moderate. The quality of the pain is described as sharp. Primary symptoms include cough. Pertinent negatives for primary symptoms include no fever, no shortness of breath, no palpitations, no abdominal pain, no nausea and no vomiting. She tried nothing for the symptoms.  Her past medical history is significant for hypertension.  Pertinent negatives for past medical history include no MI.   she will upper respiratory infection for the past few days with congestion and cough tonight shortly before arrival about 1:30 in the morning was coughing up bilateral chest pain down below no epigastric, pain no nausea no vomiting. The first time it lasted for 20 minutes and went away then came back again for about 30 minutes now has resolved. No past history of chest pain.  Patient has a history of hypertension and a past history of left bundle branch block, she is followed by cardiology for this.  Past Medical History  Diagnosis Date  . Hypertension   . Chest pain   . Migraine   . Hx of hysterectomy   . 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    Past Surgical History  Procedure Date  . Laparoscopic cholecystectomy 2005  . Tubal ligation     bilateral  . Rotator cuff repair 2009    arthroscopic  . Abdominal hysterectomy     No family history  on file.  History  Substance Use Topics  . Smoking status: Never Smoker   . Smokeless tobacco: Never Used  . Alcohol Use: No    OB History    Grav Para Term Preterm Abortions TAB SAB Ect Mult Living                  Review of Systems  Constitutional: Negative for fever and chills.  HENT: Positive for congestion. Negative for neck pain and neck stiffness.   Eyes: Negative for redness.  Respiratory: Positive for cough. Negative for shortness of breath.   Cardiovascular: Positive for chest pain. Negative for palpitations.  Gastrointestinal: Negative for nausea, vomiting and abdominal pain.  Genitourinary: Negative for dysuria.  Musculoskeletal: Negative for back pain.  Skin: Negative for rash.  Neurological: Negative for headaches.  Hematological: Does not bruise/bleed easily.    Allergies  Review of patient's allergies indicates no known allergies.  Home Medications   Current Outpatient Rx  Name Route Sig Dispense Refill  . AZITHROMYCIN 250 MG PO TABS Oral Take 1 tablet (250 mg total) by mouth daily. Take first 2 tablets together, then 1 every day until finished. 6 tablet 0  . FOLIC ACID 1 MG PO TABS Oral Take 1 mg by mouth daily.      Marland Kitchen LISINOPRIL 40 MG PO TABS Oral Take 40 mg by mouth daily.      . WOMENS MULTI VITAMIN & MINERAL PO TABS Oral Take  1 tablet by mouth daily.      Marland Kitchen NAPROXEN 375 MG PO TABS Oral Take 375 mg by mouth 2 (two) times daily with a meal.      . FISH OIL 300 MG PO CAPS Oral Take 1 capsule by mouth daily.        BP 125/61  Pulse 84  Resp 17  SpO2 97%  Physical Exam  Nursing note and vitals reviewed. Constitutional: She is oriented to person, place, and time. She appears well-developed and well-nourished. No distress.  HENT:  Head: Normocephalic and atraumatic.  Mouth/Throat: Oropharynx is clear and moist.  Eyes: Conjunctivae and EOM are normal. Pupils are equal, round, and reactive to light.  Neck: Normal range of motion. Neck supple.    Cardiovascular: Normal rate, regular rhythm, normal heart sounds and intact distal pulses.   No murmur heard. Pulmonary/Chest: Effort normal. No respiratory distress. She has no wheezes. She has no rales. She exhibits no tenderness.  Abdominal: Soft. Bowel sounds are normal. There is no tenderness.  Musculoskeletal: Normal range of motion. She exhibits no edema and no tenderness.  Neurological: She is alert and oriented to person, place, and time. No cranial nerve deficit. She exhibits normal muscle tone. Coordination normal.  Skin: Skin is warm. No rash noted.    ED Course  Procedures (including critical care time)  Labs Reviewed  BASIC METABOLIC PANEL - Abnormal; Notable for the following:    Potassium 3.4 (*)    Glucose, Bld 148 (*)    GFR calc non Af Amer 73 (*)    GFR calc Af Amer 85 (*)    All other components within normal limits  CBC - Abnormal; Notable for the following:    WBC 11.4 (*)    All other components within normal limits  TROPONIN I   Dg Chest 2 View  12/21/2011  *RADIOLOGY REPORT*  Clinical Data: Chest pain and cough.  CHEST - 2 VIEW  Comparison: Chest radiograph performed 11/14/2010, and CTA of the chest performed 01/06/2011  Findings: The lungs are well-aerated.  Mild right basilar airspace opacity raises question for mild pneumonia.  There is no evidence of pleural effusion or pneumothorax.  The heart is normal in size; the mediastinal contour is within normal limits.  No acute osseous abnormalities are seen.  Clips are noted within the right upper quadrant, reflecting prior cholecystectomy.  IMPRESSION: Mild right basilar airspace opacity raises concern for mild pneumonia.  Original Report Authenticated By: Tonia Ghent, M.D.   Results for orders placed during the hospital encounter of 12/21/11  BASIC METABOLIC PANEL      Component Value Range   Sodium 138  135 - 145 (mEq/L)   Potassium 3.4 (*) 3.5 - 5.1 (mEq/L)   Chloride 103  96 - 112 (mEq/L)   CO2 27  19  - 32 (mEq/L)   Glucose, Bld 148 (*) 70 - 99 (mg/dL)   BUN 10  6 - 23 (mg/dL)   Creatinine, Ser 6.21  0.50 - 1.10 (mg/dL)   Calcium 9.7  8.4 - 30.8 (mg/dL)   GFR calc non Af Amer 73 (*) >90 (mL/min)   GFR calc Af Amer 85 (*) >90 (mL/min)  CBC      Component Value Range   WBC 11.4 (*) 4.0 - 10.5 (K/uL)   RBC 4.48  3.87 - 5.11 (MIL/uL)   Hemoglobin 12.7  12.0 - 15.0 (g/dL)   HCT 65.7  84.6 - 96.2 (%)   MCV 83.7  78.0 -  100.0 (fL)   MCH 28.3  26.0 - 34.0 (pg)   MCHC 33.9  30.0 - 36.0 (g/dL)   RDW 56.2  13.0 - 86.5 (%)   Platelets 199  150 - 400 (K/uL)  TROPONIN I      Component Value Range   Troponin I <0.30  <0.30 (ng/mL)    Date: 12/21/2011  Rate: 83  Rhythm: normal sinus rhythm  QRS Axis: normal  Intervals: normal  ST/T Wave abnormalities: nonspecific ST/T changes  Conduction Disutrbances:left bundle branch block  Narrative Interpretation:   Old EKG Reviewed: none available    1. CAP (community acquired pneumonia)       MDM  Workup with finding of right air space opacity consistent with pneumonia this would be a community-acquired pneumonia. Patient nontoxic in the emergency department with good room air sats. Since IV already established received 1 g or Rocephin as early treatment and we'll send home with Zithromax. Chest pain complaint was bilateral worse with cough consistent with chest wall complaint however EKG without acute findings and troponin negative.   As stated EKG shows left bundle branch block patient has a history of that troponin was negative. Symptoms believed to be chest wall pain in nature and related to the pneumonia and upper respiratory infection.       Shelda Jakes, MD 12/21/11 [phone]

## 2015-01-10 ENCOUNTER — Emergency Department (HOSPITAL_COMMUNITY)
Admission: EM | Admit: 2015-01-10 | Discharge: 2015-01-10 | Disposition: A | Discharge status: Home / Self Care | Payer: No Typology Code available for payment source | Attending: Emergency Medicine | Admitting: Emergency Medicine

## 2015-01-10 ENCOUNTER — Encounter (HOSPITAL_COMMUNITY): Payer: Self-pay | Admitting: Cardiology

## 2015-01-10 ENCOUNTER — Emergency Department (HOSPITAL_COMMUNITY): Payer: No Typology Code available for payment source

## 2015-01-10 DIAGNOSIS — I10 Essential (primary) hypertension: Secondary | ICD-10-CM | POA: Diagnosis not present

## 2015-01-10 DIAGNOSIS — M25512 Pain in left shoulder: Secondary | ICD-10-CM | POA: Diagnosis present

## 2015-01-10 DIAGNOSIS — M19012 Primary osteoarthritis, left shoulder: Secondary | ICD-10-CM

## 2015-01-10 DIAGNOSIS — Z791 Long term (current) use of non-steroidal anti-inflammatories (NSAID): Secondary | ICD-10-CM | POA: Diagnosis not present

## 2015-01-10 DIAGNOSIS — Z8679 Personal history of other diseases of the circulatory system: Secondary | ICD-10-CM | POA: Diagnosis not present

## 2015-01-10 DIAGNOSIS — Z8669 Personal history of other diseases of the nervous system and sense organs: Secondary | ICD-10-CM | POA: Insufficient documentation

## 2015-01-10 DIAGNOSIS — Z7952 Long term (current) use of systemic steroids: Secondary | ICD-10-CM | POA: Insufficient documentation

## 2015-01-10 DIAGNOSIS — M1990 Unspecified osteoarthritis, unspecified site: Secondary | ICD-10-CM | POA: Diagnosis not present

## 2015-01-10 MED ORDER — PREDNISONE 10 MG PO TABS: ORAL_TABLET | ORAL | Status: DC

## 2015-01-10 MED ORDER — PREDNISONE 50 MG PO TABS
60.0000 mg | ORAL_TABLET | Freq: Once | ORAL | Status: AC
  Administered 2015-01-10: 60 mg via ORAL
  Filled 2015-01-10 (×2): qty 1

## 2015-01-10 NOTE — ED Provider Notes (Signed)
CSN: [id number]     Arrival date & time 01/10/15  2774 History   First MD Initiated Contact with Patient 01/10/15 [phone]     Chief Complaint  Patient presents with  . Shoulder Pain     (Consider location/radiation/quality/duration/timing/severity/associated sxs/prior Treatment) Patient is a 64 y.o. female presenting with shoulder pain. The history is provided by the patient.  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)   Shoulder location:  L shoulder Pain details:    Quality:  Sharp and shooting (shoots through to posterior shoulder with movement)   Severity:  Moderate   Onset quality:  Gradual   Duration:  2 weeks   Timing:  Constant   Progression:  Worsening Chronicity:  Recurrent (symptoms similar to pain she experienced prior to ner rotator cuff surgery in 2009) Handedness:  Right-handed Prior injury to area:  Yes Relieved by:  Rest Worsened by:  Movement and stretching area Ineffective treatments:  NSAIDs Associated symptoms: no fever, no neck pain and no swelling   Associated symptoms comment:  Denies chest pain and sob.   Past Medical History  Diagnosis Date  . Hypertension   . Chest pain   . Migraine   . Hx of hysterectomy   . 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 Laparoscopic cholecystectomy  2005  . Tubal ligation      bilateral  . Rotator cuff repair  2009    arthroscopic  . Abdominal hysterectomy     History reviewed. No pertinent family history. History  Substance Use Topics  . Smoking status: Never Smoker   . Smokeless tobacco: Never Used  . Alcohol Use: No   OB History    No data available     Review of Systems  Constitutional: Negative for fever.  Respiratory: Negative for shortness of breath.   Cardiovascular: Negative for chest  pain.  Gastrointestinal: Negative for nausea and vomiting.  Musculoskeletal: Positive for arthralgias. Negative for myalgias, joint swelling and neck pain.  Neurological: Negative for weakness and numbness.      Allergies  Review of patient's allergies indicates no known allergies.  Home Medications   Prior to Admission medications   Medication Sig Start Date End Date Taking? Authorizing Provider  folic acid (FOLVITE) 1 MG tablet Take 1 mg by mouth daily.      Historical Provider, MD  lisinopril (PRINIVIL,ZESTRIL) 40 MG tablet Take 40 mg by mouth daily.      Historical Provider, MD  Multiple Vitamins-Minerals (WOMENS MULTI VITAMIN & MINERAL) TABS Take 1 tablet by mouth daily.      Historical Provider, MD  naproxen (NAPROSYN) 375 MG tablet Take 375 mg by mouth 2 (two) times daily with a meal.      Historical Provider, MD  Omega-3 Fatty Acids (FISH OIL) 300 MG CAPS Take 1 capsule by mouth daily.      Historical Provider, MD  predniSONE (DELTASONE) 10 MG tablet 6, 5, 4, 3, 2 then 1 tablet by mouth daily for 6 days total. 01/10/15   Burgess Amor, PA-C   BP 190/81 mmHg  Pulse 70  Temp(Src) 97.9 F (36.6 C) (Oral)  Resp 20  Ht 5\' 4"  (1.626 m)  Wt 145 lb (65.772 kg)  BMI 24.88 kg/m2  SpO2 100%  LMP  Physical Exam  Constitutional: She appears well-developed and well-nourished.  HENT:  Head: Atraumatic.  Neck: Normal range of motion.  Cardiovascular:  Pulses equal bilaterally  Musculoskeletal: She exhibits tenderness. She exhibits no edema.       Left shoulder: She exhibits bony tenderness. She exhibits no swelling, no effusion, no crepitus, normal pulse and normal strength.  Pain triggered with forward flexion and abduction, both passive and active.  Neurological: She is alert. She has normal strength. She displays normal reflexes. No sensory deficit.  Skin: Skin is warm and dry.  Psychiatric: She has a normal mood and affect.    ED Course  Procedures (including critical care  time) Labs Review Labs Reviewed - No data to display  Imaging Review Dg Shoulder Left  01/10/2015   CLINICAL DATA:  Pain since last month.  No known injury.  EXAM: LEFT SHOULDER - 2+ VIEW  COMPARISON:  MRI 06/22/2008  FINDINGS: Mild degenerative changes in the left AC joint. Glenohumeral joint is intact and maintained. No acute bony abnormality. Specifically, no fracture, subluxation, or dislocation. Soft tissues are intact.  IMPRESSION: No acute bony abnormality. Mild degenerative changes in the left AC joint.   Electronically Signed   By: Charlett Nose M.D.   On: 01/10/2015 10:01     EKG Interpretation None      MDM   Final diagnoses:  Osteoarthritis of left shoulder, unspecified osteoarthritis type    Recurrent pain in left shoulder, reproducible with palpation and movement, prior rotator injury/repair with osteoarthritis on films today. Referral back to ortho for further management prn if not improved with rest, heat, steroids.  No cp, no sob, exam and h/o c/w musculoskeletal source. Work note given for light duty.  Discussed bp     Burgess Amor, PA-C 01/10/15 1010  Burgess Amor, PA-C 01/10/15 1721  Margarita Grizzle, MD 01/11/15 [phone]

## 2015-01-10 NOTE — Discharge Instructions (Signed)
Arthritis, Nonspecific °Arthritis is inflammation of a joint. This usually means pain, redness, warmth or swelling are present. One or more joints may be involved. There are a number of types of arthritis. Your caregiver may not be able to tell what type of arthritis you have right away. °CAUSES  °The most common cause of arthritis is the wear and tear on the joint (osteoarthritis). This causes damage to the cartilage, which can break down over time. The knees, hips, back and neck are most often affected by this type of arthritis. °Other types of arthritis and common causes of joint pain include: °· Sprains and other injuries near the joint. Sometimes minor sprains and injuries cause pain and swelling that develop hours later. °· Rheumatoid arthritis. This affects hands, feet and knees. It usually affects both sides of your body at the same time. It is often associated with chronic ailments, fever, weight loss and general weakness. °· Crystal arthritis. Gout and pseudo gout can cause occasional acute severe pain, redness and swelling in the foot, ankle, or knee. °· Infectious arthritis. Bacteria can get into a joint through a break in overlying skin. This can cause infection of the joint. Bacteria and viruses can also spread through the blood and affect your joints. °· Drug, infectious and allergy reactions. Sometimes joints can become mildly painful and slightly swollen with these types of illnesses. °SYMPTOMS  °· Pain is the main symptom. °· Your joint or joints can also be red, swollen and warm or hot to the touch. °· You may have a fever with certain types of arthritis, or even feel overall ill. °· The joint with arthritis will hurt with movement. Stiffness is present with some types of arthritis. °DIAGNOSIS  °Your caregiver will suspect arthritis based on your description of your symptoms and on your exam. Testing may be needed to find the type of arthritis: °· Blood and sometimes urine tests. °· X-ray tests  and sometimes CT or MRI scans. °· Removal of fluid from the joint (arthrocentesis) is done to check for bacteria, crystals or other causes. Your caregiver (or a specialist) will numb the area over the joint with a local anesthetic, and use a needle to remove joint fluid for examination. This procedure is only minimally uncomfortable. °· Even with these tests, your caregiver may not be able to tell what kind of arthritis you have. Consultation with a specialist (rheumatologist) may be helpful. °TREATMENT  °Your caregiver will discuss with you treatment specific to your type of arthritis. If the specific type cannot be determined, then the following general recommendations may apply. °Treatment of severe joint pain includes: °· Rest. °· Elevation. °· Anti-inflammatory medication (for example, ibuprofen) may be prescribed. Avoiding activities that cause increased pain. °· Only take over-the-counter or prescription medicines for pain and discomfort as recommended by your caregiver. °· Cold packs over an inflamed joint may be used for 10 to 15 minutes every hour. Hot packs sometimes feel better, but do not use overnight. Do not use hot packs if you are diabetic without your caregiver's permission. °· A cortisone shot into arthritic joints may help reduce pain and swelling. °· Any acute arthritis that gets worse over the next 1 to 2 days needs to be looked at to be sure there is no joint infection. °Long-term arthritis treatment involves modifying activities and lifestyle to reduce joint stress jarring. This can include weight loss. Also, exercise is needed to nourish the joint cartilage and remove waste. This helps keep the muscles   around the joint strong. HOME CARE INSTRUCTIONS   Do not take aspirin to relieve pain if gout is suspected. This elevates uric acid levels.  Only take over-the-counter or prescription medicines for pain, discomfort or fever as directed by your caregiver.  Rest the joint as much as  possible.  If your joint is swollen, keep it elevated.  Use crutches if the painful joint is in your leg.  Drinking plenty of fluids may help for certain types of arthritis.  Follow your caregiver's dietary instructions.  Try low-impact exercise such as:  Swimming.  Water aerobics.  Biking.  Walking.  Morning stiffness is often relieved by a warm shower.  Put your joints through regular range-of-motion. SEEK MEDICAL CARE IF:   You do not feel better in 24 hours or are getting worse.  You have side effects to medications, or are not getting better with treatment. SEEK IMMEDIATE MEDICAL CARE IF:   You have a fever.  You develop severe joint pain, swelling or redness.  Many joints are involved and become painful and swollen.  There is severe back pain and/or leg weakness.  You have loss of bowel or bladder control. Document Released: 11/13/2004 Document Revised: 12/29/2011 Document Reviewed: 11/29/2008 [facility] Patient Information 2015 [place], [place]. This information is not intended to replace advice given to you by your health care provider. Make sure you discuss any questions you have with your health care provider.   Use the medicine prescribed for the next 6 days, taking your first dose tomorrow as you have received todays dose here.  Apply a heating pad to your shoulder 20 minutes 3 times daily.

## 2015-01-10 NOTE — ED Notes (Signed)
Left shoulder pain times 2 weeks. Denies any injury.

## 2015-01-24 ENCOUNTER — Ambulatory Visit (INDEPENDENT_AMBULATORY_CARE_PROVIDER_SITE_OTHER): Payer: No Typology Code available for payment source | Admitting: Orthopedic Surgery

## 2015-01-24 VITALS — BP 143/78 | Ht 64.0 in | Wt 145.0 lb

## 2015-01-24 DIAGNOSIS — M75102 Unspecified rotator cuff tear or rupture of left shoulder, not specified as traumatic: Secondary | ICD-10-CM | POA: Diagnosis not present

## 2015-01-24 MED ORDER — ACETAMINOPHEN-CODEINE #3 300-30 MG PO TABS: 1.0000 | ORAL_TABLET | Freq: Four times a day (QID) | ORAL | Status: DC | PRN

## 2015-01-24 MED ORDER — NABUMETONE 500 MG PO TABS: 500.0000 mg | ORAL_TABLET | Freq: Two times a day (BID) | ORAL | Status: DC

## 2015-01-24 NOTE — Patient Instructions (Signed)
You have received an injection of steroids into the joint. 15% of patients will have increased pain within the 24 hours postinjection.   This is transient and will go away.   We recommend that you use ice packs on the injection site for 20 minutes every 2 hours and extra strength Tylenol 2 tablets every 8 as needed until the pain resolves.  If you continue to have pain after taking the Tylenol and using the ice please call the office for further instructions.  Home exercises  2 Medications for 1-for inflammation and 1 for pain

## 2015-01-25 ENCOUNTER — Encounter: Payer: Self-pay | Admitting: Orthopedic Surgery

## 2015-01-25 NOTE — Progress Notes (Signed)
Patient ID: Summer Christensen, female   DOB: 1950-12-31, 64 y.o.   MRN: [id number]  Chief Complaint  Patient presents with  . Follow-up    er follow up left shoulder pain     Summer Christensen is a 64 y.o. female.   HPI The patient possessed with acute onset onset of left shoulder pain and has had pain for the last 2 months. She denies locking complains of stiffness and catching. She complains of painful for elevation of the arm. She took Advil but is not helping. Review of Systems She denies fever or warmth or redness around her shoulder joint  Past Medical History  Diagnosis Date  . Hypertension   . Chest pain   . Migraine   . Hx of hysterectomy   . 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 Laparoscopic cholecystectomy  2005  . Tubal ligation      bilateral  . Rotator cuff repair  2009    arthroscopic  . Abdominal hysterectomy      History reviewed. No pertinent family history.  Social History History  Substance Use Topics  . Smoking status: Never Smoker   . Smokeless tobacco: Never Used  . Alcohol Use: No    No Known Allergies  Current Outpatient Prescriptions  Medication Sig Dispense Refill  . losartan (COZAAR) 50 MG tablet Take 50 mg by mouth daily.    . Multiple Vitamins-Minerals (WOMENS MULTI VITAMIN & MINERAL) TABS Take 1 tablet by mouth daily.      Marland Kitchen acetaminophen-codeine (TYLENOL #3) 300-30 MG per tablet Take 1-2 tablets by mouth every 6 (six) hours as needed for moderate pain. 60 tablet 2  . nabumetone (RELAFEN) 500 MG tablet Take 1 tablet (500 mg total) by mouth 2 (two) times daily. 60 tablet 0   No current facility-administered medications for this visit.       Physical Exam Blood pressure 143/78, height 5\' 4"  (1.626 m), weight 145 lb (65.772 kg). Physical Exam The patient is well developed well nourished and well groomed. Orientation to person place and time is normal  Mood is pleasant. Ambulatory  status normal ambulatory status.  Inspection of the right shoulder reveals no abnormalities. She has full range of motion normal strength no instability   Scars left shoulder goes she has. Acromial tenderness pain and the rotator interval pain over the coracoid and biceps tendon. Pain in the posterior subacromial space. Painful for elevation of 250. She is stable in abduction external rotation negative sulcus sign. Motor exam is normal including supraspinatus empty can sign. Rotator cuff impingement sign positive. Before meals joint stress test negative.  Skin normal. Neurovascular exam normal in both limbs with no neck tenderness and normal reflexes at the elbow    Data Reviewed Imaging shows no acute abnormality and humeral joint looks normal  Assessment Encounter Diagnosis  Name Primary?  . Rotator cuff syndrome of left shoulder Yes    Plan Recommend Tylenol 3 and Relafen 500 mg twice a day follow-up as needed she should complete a home exercise program for strengthening which was given to her.

## 2015-03-05 ENCOUNTER — Telehealth: Payer: Self-pay | Admitting: Orthopedic Surgery

## 2015-03-05 NOTE — Telephone Encounter (Signed)
Patient is complaining of left shoulder still hurting and the pain radiates down to her elbow, medications is not helping, she was last seen in office April 6th 2016, please advise?

## 2015-03-05 NOTE — Telephone Encounter (Signed)
Patient is scheduled of  01/24/15 and she is aware

## 2015-03-05 NOTE — Telephone Encounter (Signed)
shedule appt

## 2015-03-08 ENCOUNTER — Ambulatory Visit (INDEPENDENT_AMBULATORY_CARE_PROVIDER_SITE_OTHER): Payer: No Typology Code available for payment source | Admitting: Orthopedic Surgery

## 2015-03-08 ENCOUNTER — Encounter: Payer: Self-pay | Admitting: Orthopedic Surgery

## 2015-03-08 VITALS — BP 173/80 | Ht 64.0 in | Wt 145.0 lb

## 2015-03-08 DIAGNOSIS — M75102 Unspecified rotator cuff tear or rupture of left shoulder, not specified as traumatic: Secondary | ICD-10-CM

## 2015-03-08 MED ORDER — HYDROCODONE-ACETAMINOPHEN 5-325 MG PO TABS: 1.0000 | ORAL_TABLET | Freq: Four times a day (QID) | ORAL | Status: DC | PRN

## 2015-03-08 NOTE — Progress Notes (Signed)
Patient ID: OLUWADAMILOLA DEALMEIDA, female   DOB: 04/29/1951, 64 y.o.   MRN: [id number] Chief Complaint  Patient presents with  . Follow-up    Left shoulder pain     64 year old female follows up for persistent pain left shoulder after giving history of two-month history of acute onset of pain. We took x-rays they were normal she had Advil Tylenol 3 no improvement in her symptoms.  Home exercises didn't help  Injection didn't help  Medication not helping   Pain radiates up into the neck but it's from the shoulder to the neck and not in the reverse direction  She denies numbness and tingling pain is confined between the shoulder and elbow and her deltoid and upper arm are sore  Exam shows tenderness in the rotator interval. She has painful range of motion throughout the arc of motion in flexion and abduction external rotation seems normal internal rotation is decreased but no pain she does feel some pulling in the front of the shoulder elbow normal.  The shoulder is stable she does have a positive impingement sign she has a 4 out of 5 strength in the rotator cuff in the cam position versus 5 out of 5 on the opposite side  I think she has rotator cuff tear like an MRI she could have some Vicodin for pain  I'll call her with the results and make a further treatment plan after that.

## 2015-03-08 NOTE — Patient Instructions (Signed)
MRI .... Dr to call results to patient

## 2015-03-26 ENCOUNTER — Ambulatory Visit (HOSPITAL_COMMUNITY)
Admission: RE | Admit: 2015-03-26 | Discharge: 2015-03-26 | Disposition: A | Discharge status: Home / Self Care | Payer: No Typology Code available for payment source | Source: Ambulatory Visit | Attending: Orthopedic Surgery | Admitting: Orthopedic Surgery

## 2015-03-26 DIAGNOSIS — M25512 Pain in left shoulder: Secondary | ICD-10-CM | POA: Diagnosis not present

## 2015-03-26 DIAGNOSIS — M7582 Other shoulder lesions, left shoulder: Secondary | ICD-10-CM | POA: Diagnosis not present

## 2015-03-26 DIAGNOSIS — M75102 Unspecified rotator cuff tear or rupture of left shoulder, not specified as traumatic: Secondary | ICD-10-CM

## 2015-03-27 ENCOUNTER — Telehealth: Payer: Self-pay | Admitting: Orthopedic Surgery

## 2015-03-27 NOTE — Telephone Encounter (Signed)
Patient is calling requesting results of MRI of shoulder done 03/26/15, Patient understands that those results are given [facility]

## 2015-03-28 ENCOUNTER — Telehealth: Payer: Self-pay | Admitting: Orthopedic Surgery

## 2015-03-28 NOTE — Telephone Encounter (Signed)
MRI March 26 2015 HAD TO LEAVE MESSAGE   I WANT TO DISCUSS SURGICAL AND NON SURGICAL OPTIONS  WOULD NEED AC JOINT ARTHROSCOPICALLY THEN DEBRIDE TENDON AND SEE IF REPAIR NEEDED   FINDINGS: Rotator cuff: Mild tendinosis of the supraspinatus and infraspinatus tendon with a small insertional interstitial tear at the junction of the supraspinatus -infraspinatus tendon. Teres minor tendon is intact. Subscapularis tendon is intact.   Muscles: No atrophy or fatty replacement of nor abnormal signal within, the muscles of the rotator cuff.   Biceps long head:  Intact.   Acromioclavicular Joint: Mild degenerative changes of the acromioclavicular joint. Type I acromion. Multiloculated ganglion cyst emanating from the [facility] joint measuring 14 mm.   Glenohumeral Joint: No joint effusion. No chondral defect. No dislocation.   Labrum: Grossly intact, but evaluation is limited by lack of intraarticular fluid.   Bones:  No marrow signal abnormality.  No fracture or dislocation.

## 2015-03-29 ENCOUNTER — Telehealth: Payer: Self-pay | Admitting: Orthopedic Surgery

## 2015-03-29 NOTE — Telephone Encounter (Signed)
Summer Christensen is calling back for MRI Results, please advise?

## 2015-03-29 NOTE — Telephone Encounter (Signed)
Routing to Dr Harrison 

## 2015-03-29 NOTE — Telephone Encounter (Signed)
MRI March 26 2015 HAD TO LEAVE MESSAGE   I WANT TO DISCUSS SURGICAL AND NON SURGICAL OPTIONS  WOULD NEED AC JOINT ARTHROSCOPICALLY THEN DEBRIDE TENDON AND SEE IF REPAIR NEEDED

## 2015-04-03 NOTE — Telephone Encounter (Signed)
Dr Romeo Apple to call patient

## 2015-04-03 NOTE — Telephone Encounter (Signed)
Discussion with patient surgery vs non op treatment   She wants to see what her supervisor says

## 2015-04-10 ENCOUNTER — Telehealth: Payer: Self-pay | Admitting: Orthopedic Surgery

## 2015-04-10 NOTE — Telephone Encounter (Signed)
Patient stopped in to office with question about work status - asking if any restrictions, based on recent MRI and results discussed with Dr Romeo Apple.  States she is trying to hold on surgery, and proceed with non-operative treatment including physical therapy, as discussed; please advise regarding work note and/or follow up appointment, which is not yet scheduled.  Patient [phone]

## 2015-04-18 ENCOUNTER — Encounter: Payer: Self-pay | Admitting: Orthopedic Surgery

## 2015-04-18 NOTE — Telephone Encounter (Signed)
As discussed with Dr Romeo Apple, 04/11/15, okay to provide patient with a note of what she needs for work.  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  Note entered accordingly; patient picked up.

## 2015-05-09 ENCOUNTER — Telehealth: Payer: Self-pay | Admitting: Orthopedic Surgery

## 2015-05-09 NOTE — Telephone Encounter (Signed)
Patient had come to office 05/07/15 to relay that the work note issued 04/18/15 as per her request regarding her job duties was not sufficient; therefore, a job description was requested per patient with supervisor Myer Haff, ph# [phone], fax#[phone] at [employer]; it has been received via fax.    Patient requests note to include a 10-lb weight limit for lifting; also to avoid or limit pushing and pulling; due to her ongoing left shoulder problem -- she states that the shoulder is due to work-related injury in 2009. Job description information is in Dr [employer] box for review and advice regarding the new note.   Patient's ph# is [phone]

## 2015-05-18 ENCOUNTER — Encounter: Payer: Self-pay | Admitting: Orthopedic Surgery

## 2015-05-18 NOTE — Telephone Encounter (Signed)
Patient called back in response to voice message regarding the work note.  She verified the information, and requested to be faxed back. Note has been done, and faxed to employer as requested (no authorization required per work comp) Fax# [phone]/alternate (secure) fax [phone] per ph#[phone].

## 2015-06-21 ENCOUNTER — Encounter: Payer: Self-pay | Admitting: Orthopedic Surgery

## 2015-06-21 ENCOUNTER — Ambulatory Visit: Payer: No Typology Code available for payment source | Admitting: Orthopedic Surgery

## 2015-06-27 ENCOUNTER — Ambulatory Visit: Payer: No Typology Code available for payment source | Admitting: Orthopedic Surgery

## 2015-07-02 ENCOUNTER — Telehealth: Payer: Self-pay | Admitting: Orthopedic Surgery

## 2015-07-02 ENCOUNTER — Ambulatory Visit: Payer: No Typology Code available for payment source | Admitting: Orthopedic Surgery

## 2015-07-02 NOTE — Telephone Encounter (Signed)
The patient is stating that she has documentation that this shoulder problem is from work and will give [place] a workers Electronics engineer number to be filed as a workers Electronics engineer case

## 2015-07-10 ENCOUNTER — Telehealth: Payer: Self-pay | Admitting: Orthopedic Surgery

## 2015-07-10 NOTE — Telephone Encounter (Signed)
Called patient in response to paperwork she had left at our office to review -- it was a request addressed to her by Office of Workers comp.  Left voice message to return call.  Need to advise when patient calls back, per our contracted medical records provider, [facility], that patient needs to directly respond to the records request, as it includes other providers as well, and that we are unable to directly respond as it is not addressed to our clinic.

## 2015-07-13 NOTE — Telephone Encounter (Signed)
07/13/15 Called back to patient due to no response to attempts to reach; again left a voice message to return call.

## 2015-07-18 NOTE — Telephone Encounter (Signed)
07/17/15 - patient returned call; upon relaying that we are unable to directly respond and fax records with copy of request she had presented, as it is not directed to Dr Romeo Apple or [facility]. Request is to patient directly, and also includes a request for records from [facility] and other [facility] providers; therefore, we can respond only if a new request is sent directly to [place].  Offered to have patient to come to sign a release to provide a copy to her; patient hung up.

## 2015-08-03 ENCOUNTER — Encounter: Payer: Self-pay | Admitting: Orthopedic Surgery

## 2015-08-08 ENCOUNTER — Telehealth: Payer: Self-pay | Admitting: Orthopedic Surgery

## 2015-08-08 NOTE — Telephone Encounter (Signed)
Patient called today, 08/08/15 to request that her work restrictions be removed.  Most recent work letter indicates restriction of 10-lb weight limit, and no pushing or pulling, which was to be in effect until patient's scheduled appointment 06/21/15.  Patient missed this appointment date and had re-scheduled twice, most recently to date 07/01/15.  She relayed that Dr Romeo Apple called her on a Sunday evening (06/30/15?) and said she did not have to come to the appointment 07/01/15, and discussed this matter.  Please advise regarding work note.  Her ph# is [phone]

## 2015-08-09 ENCOUNTER — Encounter: Payer: Self-pay | Admitting: Orthopedic Surgery

## 2015-08-09 NOTE — Telephone Encounter (Signed)
Ok

## 2015-08-09 NOTE — Telephone Encounter (Signed)
Called back to patient to relay per Dr Mort Sawyers response; left voice message. Note done, as per request.

## 2015-08-16 NOTE — Telephone Encounter (Signed)
Followed up due to no response; reached patient; mailed as per patient request.

## 2015-08-22 ENCOUNTER — Emergency Department (HOSPITAL_COMMUNITY)
Admission: EM | Admit: 2015-08-22 | Discharge: 2015-08-22 | Disposition: A | Discharge status: Home / Self Care | Payer: No Typology Code available for payment source | Attending: Emergency Medicine | Admitting: Emergency Medicine

## 2015-08-22 ENCOUNTER — Encounter (HOSPITAL_COMMUNITY): Payer: Self-pay | Admitting: *Deleted

## 2015-08-22 DIAGNOSIS — Z79899 Other long term (current) drug therapy: Secondary | ICD-10-CM | POA: Diagnosis not present

## 2015-08-22 DIAGNOSIS — R51 Headache: Secondary | ICD-10-CM | POA: Diagnosis present

## 2015-08-22 DIAGNOSIS — I10 Essential (primary) hypertension: Secondary | ICD-10-CM | POA: Diagnosis not present

## 2015-08-22 LAB — BASIC METABOLIC PANEL
Anion gap: 8 (ref 5–15)
BUN: 13 mg/dL (ref 6–20)
CALCIUM: 9.3 mg/dL (ref 8.9–10.3)
CHLORIDE: 110 mmol/L (ref 101–111)
CO2: 24 mmol/L (ref 22–32)
CREATININE: 0.88 mg/dL (ref 0.44–1.00)
GFR calc non Af Amer: >60 mL/min (ref >60)
Glucose, Bld: 119 mg/dL — ABNORMAL HIGH (ref 65–99)
Potassium: 3.2 mmol/L — ABNORMAL LOW (ref 3.5–5.1)
SODIUM: 142 mmol/L (ref 135–145)

## 2015-08-22 MED ORDER — LOSARTAN POTASSIUM 50 MG PO TABS: 50.0000 mg | ORAL_TABLET | Freq: Every day | ORAL | Status: DC

## 2015-08-22 MED ORDER — LOSARTAN POTASSIUM 50 MG PO TABS
100.0000 mg | ORAL_TABLET | Freq: Once | ORAL | Status: AC
  Administered 2015-08-22: 100 mg via ORAL
  Filled 2015-08-22: qty 2

## 2015-08-22 MED ORDER — ACETAMINOPHEN 500 MG PO TABS
1000.0000 mg | ORAL_TABLET | Freq: Once | ORAL | Status: AC
  Administered 2015-08-22: 1000 mg via ORAL
  Filled 2015-08-22: qty 2

## 2015-08-22 NOTE — Discharge Instructions (Signed)
Hypertension Hypertension, commonly called high blood pressure, is when the force of blood pumping through your arteries is too strong. Your arteries are the blood vessels that carry blood from your heart throughout your body. A blood pressure reading consists of a higher number over a lower number, such as 110/72. The higher number (systolic) is the pressure inside your arteries when your heart pumps. The lower number (diastolic) is the pressure inside your arteries when your heart relaxes. Ideally you want your blood pressure below 120/80. Hypertension forces your heart to work harder to pump blood. Your arteries may become narrow or stiff. Having untreated or uncontrolled hypertension can cause heart attack, stroke, kidney disease, and other problems. RISK FACTORS Some risk factors for high blood pressure are controllable. Others are not.  Risk factors you cannot control include:   Race. You may be at higher risk if you are African American.  Age. Risk increases with age.  Gender. Men are at higher risk than women before age 45 years. After age 65, women are at higher risk than men. Risk factors you can control include:  Not getting enough exercise or physical activity.  Being overweight.  Getting too much fat, sugar, calories, or salt in your diet.  Drinking too much alcohol. SIGNS AND SYMPTOMS Hypertension does not usually cause signs or symptoms. Extremely high blood pressure (hypertensive crisis) may cause headache, anxiety, shortness of breath, and nosebleed. DIAGNOSIS To check if you have hypertension, your health care provider will measure your blood pressure while you are seated, with your arm held at the level of your heart. It should be measured at least twice using the same arm. Certain conditions can cause a difference in blood pressure between your right and left arms. A blood pressure reading that is higher than normal on one occasion does not mean that you need treatment. If  it is not clear whether you have high blood pressure, you may be asked to return on a different day to have your blood pressure checked again. Or, you may be asked to monitor your blood pressure at home for 1 or more weeks. TREATMENT Treating high blood pressure includes making lifestyle changes and possibly taking medicine. Living a healthy lifestyle can help lower high blood pressure. You may need to change some of your habits. Lifestyle changes may include:  Following the DASH diet. This diet is high in fruits, vegetables, and whole grains. It is low in salt, red meat, and added sugars.  Keep your sodium intake below 2,300 mg per day.  Getting at least 30-45 minutes of aerobic exercise at least 4 times per week.  Losing weight if necessary.  Not smoking.  Limiting alcoholic beverages.  Learning ways to reduce stress. Your health care provider may prescribe medicine if lifestyle changes are not enough to get your blood pressure under control, and if one of the following is true:  You are 18-59 years of age and your systolic blood pressure is above 140.  You are 60 years of age or older, and your systolic blood pressure is above 150.  Your diastolic blood pressure is above 90.  You have diabetes, and your systolic blood pressure is over 140 or your diastolic blood pressure is over 90.  You have kidney disease and your blood pressure is above 140/90.  You have heart disease and your blood pressure is above 140/90. Your personal target blood pressure may vary depending on your medical conditions, your age, and other factors. HOME CARE INSTRUCTIONS    Have your blood pressure rechecked as directed by your health care provider.   Take medicines only as directed by your health care provider. Follow the directions carefully. Blood pressure medicines must be taken as prescribed. The medicine does not work as well when you skip doses. Skipping doses also puts you at risk for  problems.  Do not smoke.   Monitor your blood pressure at home as directed by your health care provider. SEEK MEDICAL CARE IF:   You think you are having a reaction to medicines taken.  You have recurrent headaches or feel dizzy.  You have swelling in your ankles.  You have trouble with your vision. SEEK IMMEDIATE MEDICAL CARE IF:  You develop a severe headache or confusion.  You have unusual weakness, numbness, or feel faint.  You have severe chest or abdominal pain.  You vomit repeatedly.  You have trouble breathing. MAKE SURE YOU:   Understand these instructions.  Will watch your condition.  Will get help right away if you are not doing well or get worse.   This information is not intended to replace advice given to you by your health care provider. Make sure you discuss any questions you have with your health care provider.   Document Released: 10/06/2005 Document Revised: 02/20/2015 Document Reviewed: 07/29/2013 Elsevier Interactive Patient Education 2016 Elsevier Inc.  

## 2015-08-22 NOTE — ED Provider Notes (Signed)
CSN: [id number]     Arrival date & time 08/22/15  1540 History   First MD Initiated Contact with Patient 08/22/15 1551     Chief Complaint  Patient presents with  . Hypertension      HPI  Patient was reevaluation high blood pressure. Has been off her medication since August. Intermittent headaches. Mild bifrontal. No sudden onset or thunderclap events. No neurological symptoms. No chest pain shortness of breath or edema. With her primary care physician office today to get her medications refilled. Her blood pressure was 203 systolic. Was referred to the emergency room. She is a symptomatically mild headache currently. Previously on Cozaar. She states that this controlled her pressure well when taking it  Past Medical History  Diagnosis Date  . Hypertension   . Chest pain   . Migraine   . Hx of hysterectomy   . 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 Laparoscopic cholecystectomy  2005  . Tubal ligation      bilateral  . Rotator cuff repair  2009    arthroscopic  . Abdominal hysterectomy     History reviewed. No pertinent family history. Social History  Substance Use Topics  . Smoking status: Never Smoker   . Smokeless tobacco: Never Used  . Alcohol Use: No   OB History    No data available     Review of Systems  Constitutional: Negative for fever, chills, diaphoresis, appetite change and fatigue.  HENT: Negative for mouth sores, sore throat and trouble swallowing.   Eyes: Negative for visual disturbance.  Respiratory: Negative for cough, chest tightness, shortness of breath and wheezing.   Cardiovascular: Negative for chest pain.  Gastrointestinal: Negative for nausea, vomiting, abdominal pain, diarrhea and abdominal distention.  Endocrine: Negative for polydipsia, polyphagia and polyuria.  Genitourinary: Negative for dysuria, frequency and hematuria.  Musculoskeletal: Negative for gait problem.  Skin: Negative for  color change, pallor and rash.  Neurological: Positive for headaches. Negative for dizziness, syncope and light-headedness.  Hematological: Does not bruise/bleed easily.  Psychiatric/Behavioral: Negative for behavioral problems and confusion.      Allergies  Review of patient's allergies indicates no known allergies.  Home Medications   Prior to Admission medications   Medication Sig Start Date End Date Taking? Authorizing Provider  Multiple Vitamins-Minerals (WOMENS MULTI VITAMIN & MINERAL) TABS Take 1 tablet by mouth daily.     Yes Historical Provider, MD  acetaminophen-codeine (TYLENOL #3) 300-30 MG per tablet Take 1-2 tablets by mouth every 6 (six) hours as needed for moderate pain. Patient not taking: Reported on 08/22/2015 01/24/15   Vickki Hearing, MD  HYDROcodone-acetaminophen (NORCO/VICODIN) 5-325 MG per tablet Take 1 tablet by mouth every 6 (six) hours as needed for moderate pain. Patient not taking: Reported on 08/22/2015 03/08/15   Vickki Hearing, MD  losartan (COZAAR) 50 MG tablet Take 1 tablet (50 mg total) by mouth daily. 08/22/15   Rolland Porter, MD  nabumetone (RELAFEN) 500 MG tablet Take 1 tablet (500 mg total) by mouth 2 (two) times daily. Patient not taking: Reported on 08/22/2015 01/24/15   Vickki Hearing, MD   BP 167/80 mmHg  Pulse 66  Temp(Src) 98.3 F (36.8 C) (Oral)  Resp 16  Ht 5\' 4"  (1.626 m)  Wt 147 lb (66.679 kg)  BMI 25.22 kg/m2  SpO2 97% Physical Exam  Constitutional: She is oriented to person, place, and time. She appears well-developed and well-nourished. No distress.  HENT:  Head: Normocephalic.  Eyes: Conjunctivae are normal. Pupils are equal, round, and reactive to light. No scleral icterus.  Neck: Normal range of motion. Neck supple. No thyromegaly present.  Cardiovascular: Normal rate and regular rhythm.  Exam reveals no gallop and no friction rub.   No murmur heard. Pulmonary/Chest: Effort normal and breath sounds normal. No respiratory  distress. She has no wheezes. She has no rales.  Abdominal: Soft. Bowel sounds are normal. She exhibits no distension. There is no tenderness. There is no rebound.  Musculoskeletal: Normal range of motion.  Neurological: She is alert and oriented to person, place, and time.  Skin: Skin is warm and dry. No rash noted.  Psychiatric: She has a normal mood and affect. Her behavior is normal.    ED Course  Procedures (including critical care time) Labs Review Labs Reviewed  BASIC METABOLIC PANEL - Abnormal; Notable for the following:    Potassium 3.2 (*)    Glucose, Bld 119 (*)    All other components within normal limits    Imaging Review No results found. I have personally reviewed and evaluated these images and lab results as part of my medical decision-making.   EKG Interpretation   Date/Time:  Wednesday August 22 2015 16:03:10 EDT Ventricular Rate:  76 PR Interval:  167 QRS Duration: 86 QT Interval:  397 QTC Calculation: 446 R Axis:   22 Text Interpretation:  Sinus rhythm Anterior infarct, old Confirmed by  Fayrene Fearing  MD, Debbera Wolken ([id number]) on 08/22/2015 4:14:39 PM      MDM   Final diagnoses:  Essential hypertension        Rolland Porter, MD 09/13/15 [phone]

## 2015-08-22 NOTE — ED Notes (Addendum)
Patient states she has not had her BP meds in " a couple of months", unsure of name of med. Patient went to [facility] for appt but was told to come her due to high BP. Patient reports headache today and has had nose bleeds.

## 2016-03-13 ENCOUNTER — Encounter (INDEPENDENT_AMBULATORY_CARE_PROVIDER_SITE_OTHER): Payer: Self-pay | Admitting: *Deleted

## 2016-04-14 ENCOUNTER — Encounter (INDEPENDENT_AMBULATORY_CARE_PROVIDER_SITE_OTHER): Payer: Self-pay | Admitting: Internal Medicine

## 2016-04-14 ENCOUNTER — Other Ambulatory Visit (INDEPENDENT_AMBULATORY_CARE_PROVIDER_SITE_OTHER): Payer: Self-pay | Admitting: Internal Medicine

## 2016-04-14 ENCOUNTER — Other Ambulatory Visit (INDEPENDENT_AMBULATORY_CARE_PROVIDER_SITE_OTHER): Payer: Self-pay | Admitting: *Deleted

## 2016-04-14 ENCOUNTER — Ambulatory Visit (INDEPENDENT_AMBULATORY_CARE_PROVIDER_SITE_OTHER): Payer: No Typology Code available for payment source | Admitting: Internal Medicine

## 2016-04-14 ENCOUNTER — Encounter (INDEPENDENT_AMBULATORY_CARE_PROVIDER_SITE_OTHER): Payer: Self-pay | Admitting: *Deleted

## 2016-04-14 VITALS — BP 142/80 | HR 72 | Temp 98.0°F | Ht 64.0 in | Wt 149.8 lb

## 2016-04-14 DIAGNOSIS — K589 Irritable bowel syndrome without diarrhea: Secondary | ICD-10-CM | POA: Diagnosis not present

## 2016-04-14 DIAGNOSIS — Z1211 Encounter for screening for malignant neoplasm of colon: Secondary | ICD-10-CM

## 2016-04-14 MED ORDER — DICYCLOMINE HCL 10 MG PO CAPS: 10.0000 mg | ORAL_CAPSULE | Freq: Two times a day (BID) | ORAL | Status: DC

## 2016-04-14 NOTE — Patient Instructions (Signed)
The risks and benefits such as perforation, bleeding, and infection were reviewed with the patient and is agreeable. Rx for Dicyclomine 10mg  twice a day.

## 2016-04-14 NOTE — Progress Notes (Signed)
   Subjective:    Patient ID: Summer Christensen, female    DOB: [birth year], 65 y.o.   MRN: [id number]  HPI Referred by [facility] for abdominal pain. Pain especially after she eats. No nausea, vomiting or fever. As soon as she as she eats she has to have a BM . These symptoms are not new. Her BMs are formed. No change in her BMs.  She denies having any heart burn.  She occasionally has bloating Her appetite is good for the most part. She has not lost any weight. She avoid spicy foods and sodas since having her GB removed. She points to her mid abdomen when she has pain after eating.  The pain occurs right after she eats. She has to rush to the bathroom to have a BM.  She usually has a BM x 3 a week.  No melena or BRRB.  Her last colonoscopy was age 15.  03/05/2016 total bili 0.4, AlP 79 AST 20, ALT 27.    Review of Systems Past Medical History  Diagnosis Date  . Hypertension   . Chest pain   . Migraine   . Hx of hysterectomy   . 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 Laparoscopic cholecystectomy  2005  . Tubal ligation      bilateral  . Rotator cuff repair  2009    arthroscopic  . Abdominal hysterectomy      No Known Allergies  Current Outpatient Prescriptions on File Prior to Visit  Medication Sig Dispense Refill  . losartan (COZAAR) 50 MG tablet Take 1 tablet (50 mg total) by mouth daily. 30 tablet 0  . Multiple Vitamins-Minerals (WOMENS MULTI VITAMIN & MINERAL) TABS Take 1 tablet by mouth as needed.     Marland Kitchen acetaminophen-codeine (TYLENOL #3) 300-30 MG per tablet Take 1-2 tablets by mouth every 6 (six) hours as needed for moderate pain. (Patient not taking: Reported on 08/22/2015) 60 tablet 2  . HYDROcodone-acetaminophen (NORCO/VICODIN) 5-325 MG per tablet Take 1 tablet by mouth every 6 (six) hours as needed for moderate pain. (Patient not taking: Reported on 04/14/2016) 56 tablet 0  . nabumetone (RELAFEN) 500  MG tablet Take 1 tablet (500 mg total) by mouth 2 (two) times daily. (Patient not taking: Reported on 08/22/2015) 60 tablet 0   No current facility-administered medications on file prior to visit.        Objective:   Physical Exam Blood pressure 142/80, pulse 72, temperature 98 F (36.7 C), height 5\' 4"  (1.626 m), weight 149 lb 12.8 oz (67.949 kg).  Alert and oriented. Skin warm and dry. Oral mucosa is moist.   . Sclera anicteric, conjunctivae is pink. Thyroid not enlarged. No cervical lymphadenopathy. Lungs clear. Heart regular rate and rhythm.  Abdomen is soft. Bowel sounds are positive. No hepatomegaly. No abdominal masses felt. No tenderness.  No edema to lower extremities.         Assessment & Plan:  Abdominal pain. ? IBS. Her last colonoscopy was at age 71. She is due for a colonoscopy.  IBS: Am going to call Dicyclomine 10mg  BID and she how she does. The risks and benefits such as perforation, bleeding, and infection were reviewed with the patient and is agreeable.

## 2016-04-14 NOTE — Telephone Encounter (Signed)
Patient needs trilyte 

## 2016-04-16 ENCOUNTER — Encounter (INDEPENDENT_AMBULATORY_CARE_PROVIDER_SITE_OTHER): Payer: Self-pay

## 2016-04-16 MED ORDER — PEG 3350-KCL-NA BICARB-NACL 420 G PO SOLR: 4000.0000 mL | Freq: Once | ORAL | Status: DC

## 2016-06-11 ENCOUNTER — Encounter (HOSPITAL_COMMUNITY)
Admission: RE | Disposition: A | Discharge status: Home / Self Care | Payer: Self-pay | Source: Ambulatory Visit | Attending: Internal Medicine

## 2016-06-11 ENCOUNTER — Ambulatory Visit (HOSPITAL_COMMUNITY)
Admission: RE | Admit: 2016-06-11 | Discharge: 2016-06-11 | Disposition: A | Discharge status: Home / Self Care | Payer: No Typology Code available for payment source | Source: Ambulatory Visit | Attending: Internal Medicine | Admitting: Internal Medicine

## 2016-06-11 ENCOUNTER — Encounter (HOSPITAL_COMMUNITY): Payer: Self-pay | Admitting: *Deleted

## 2016-06-11 DIAGNOSIS — K589 Irritable bowel syndrome without diarrhea: Secondary | ICD-10-CM | POA: Insufficient documentation

## 2016-06-11 DIAGNOSIS — Z1211 Encounter for screening for malignant neoplasm of colon: Secondary | ICD-10-CM | POA: Insufficient documentation

## 2016-06-11 DIAGNOSIS — Z79899 Other long term (current) drug therapy: Secondary | ICD-10-CM | POA: Insufficient documentation

## 2016-06-11 DIAGNOSIS — I10 Essential (primary) hypertension: Secondary | ICD-10-CM | POA: Diagnosis not present

## 2016-06-11 DIAGNOSIS — Z791 Long term (current) use of non-steroidal anti-inflammatories (NSAID): Secondary | ICD-10-CM | POA: Diagnosis not present

## 2016-06-11 HISTORY — PX: COLONOSCOPY: SHX5424

## 2016-06-11 SURGERY — COLONOSCOPY
Anesthesia: Moderate Sedation

## 2016-06-11 MED ORDER — SODIUM CHLORIDE 0.9 % IV SOLN
INTRAVENOUS | Status: DC
  Administered 2016-06-11: 12:00:00 via INTRAVENOUS

## 2016-06-11 MED ORDER — MIDAZOLAM HCL 5 MG/5ML IJ SOLN
INTRAMUSCULAR | Status: AC
  Filled 2016-06-11: qty 10

## 2016-06-11 MED ORDER — MEPERIDINE HCL 50 MG/ML IJ SOLN
INTRAMUSCULAR | Status: DC | PRN
  Administered 2016-06-11 (×2): 25 mg

## 2016-06-11 MED ORDER — MEPERIDINE HCL 50 MG/ML IJ SOLN
INTRAMUSCULAR | Status: AC
  Filled 2016-06-11: qty 1

## 2016-06-11 MED ORDER — MIDAZOLAM HCL 5 MG/5ML IJ SOLN
INTRAMUSCULAR | Status: DC | PRN
  Administered 2016-06-11: 2 mg via INTRAVENOUS
  Administered 2016-06-11: 1 mg via INTRAVENOUS

## 2016-06-11 NOTE — Discharge Instructions (Signed)
Resume usual medications and diet. °No driving for 24 hours. °Next screening exam in 10 years. ° ° ° ° ° °Colonoscopy, Care After °These instructions give you information on caring for yourself after your procedure. Your doctor may also give you more specific instructions. Call your doctor if you have any problems or questions after your procedure. °HOME CARE °· Do not drive for 24 hours. °· Do not sign important papers or use machinery for 24 hours. °· You may shower. °· You may go back to your usual activities, but go slower for the first 24 hours. °· Take rest breaks often during the first 24 hours. °· Walk around or use warm packs on your belly (abdomen) if you have belly cramping or gas. °· Drink enough fluids to keep your pee (urine) clear or pale yellow. °· Resume your normal diet. Avoid heavy or fried foods. °· Avoid drinking alcohol for 24 hours or as told by your doctor. °· Only take medicines as told by your doctor. °If a tissue sample (biopsy) was taken during the procedure:  °· Do not take aspirin or blood thinners for 7 days, or as told by your doctor. °· Do not drink alcohol for 7 days, or as told by your doctor. °· Eat soft foods for the first 24 hours. °GET HELP IF: °You still have a small amount of blood in your poop (stool) 2-3 days after the procedure. °GET HELP RIGHT AWAY IF: °· You have more than a small amount of blood in your poop. °· You see clumps of tissue (blood clots) in your poop. °· Your belly is puffy (swollen). °· You feel sick to your stomach (nauseous) or throw up (vomit). °· You have a fever. °· You have belly pain that gets worse and medicine does not help. °MAKE SURE YOU: °· Understand these instructions. °· Will watch your condition. °· Will get help right away if you are not doing well or get worse. °  °This information is not intended to replace advice given to you by your health care provider. Make sure you discuss any questions you have with your health care provider. °    °Document Released: 11/08/2010 Document Revised: 10/11/2013 Document Reviewed: 06/13/2013 °Elsevier Interactive Patient Education ©2016 Elsevier Inc. ° °

## 2016-06-11 NOTE — Op Note (Signed)
[facility] Patient Name: Summer Christensen Procedure Date: 06/11/2016 12:34 PM MRN: [id number] Date of Birth: 07-04-1951 Attending MD: Lionel December , MD CSN: [id number] Age: 65 Admit Type: Outpatient Procedure:                Colonoscopy Indications:              Screening for colorectal malignant neoplasm Providers:                Lionel December, MD, Jannett Celestine, RN, Birder Robson,                            Technician Referring MD:             Smith Robert, MD Medicines:                Meperidine 50 mg IV, Midazolam 3 mg IV Complications:            No immediate complications. Estimated Blood Loss:     Estimated blood loss: none. Procedure:                Pre-Anesthesia Assessment:                           - Prior to the procedure, a History and Physical                            was performed, and patient medications and                            allergies were reviewed. The patient's tolerance of                            previous anesthesia was also reviewed. The risks                            and benefits of the procedure and the sedation                            options and risks were discussed with the patient.                            All questions were answered, and informed consent                            was obtained. Prior Anticoagulants: The patient                            last took ibuprofen 7 days prior to the procedure.                            ASA Grade Assessment: II - A patient with mild                            systemic disease. After reviewing the risks and  benefits, the patient was deemed in satisfactory                            condition to undergo the procedure.                           After obtaining informed consent, the colonoscope                            was passed under direct vision. Throughout the                            procedure, the patient's blood pressure, pulse, and       oxygen saturations were monitored continuously. The                            [id number] ([id number]) was introduced through the                            anus and advanced to the the cecum, identified by                            appendiceal orifice and ileocecal valve. The                            colonoscopy was somewhat difficult due to                            restricted mobility of the colon. Successful                            completion of the procedure was aided by changing                            the patient to a supine position. The quality of                            the bowel preparation was excellent. The ileocecal                            valve, appendiceal orifice, and rectum were                            photographed. Scope In: 12:51:55 PM Scope Out: 1:12:05 PM Scope Withdrawal Time: 0 hours 8 minutes 41 seconds  Total Procedure Duration: 0 hours 20 minutes 10 seconds  Findings:      The colon (entire examined portion) appeared normal.      The retroflexed view of the distal rectum and anal verge was normal and       showed no anal or rectal abnormalities. Impression:               - The entire examined colon is normal.                           -  No specimens collected. Moderate Sedation:      Moderate (conscious) sedation was administered by the endoscopy nurse       and supervised by the endoscopist. The following parameters were       monitored: oxygen saturation, heart rate, blood pressure, CO2       capnography and response to care. Total physician intraservice time was       25 minutes. Recommendation:           - Patient has a contact number available for                            emergencies. The signs and symptoms of potential                            delayed complications were discussed with the                            patient. Return to normal activities tomorrow.                            Written discharge instructions were  provided to the                            patient.                           - Resume previous diet today.                           - Continue present medications.                           - Repeat colonoscopy in 10 years for screening                            purposes. Procedure Code(s):        --- Professional ---                           [phone], Colonoscopy, flexible; diagnostic, including                            collection of specimen(s) by brushing or washing,                            when performed (separate procedure)                           99152, Moderate sedation services provided by the                            same physician or other qualified health care                            professional performing the diagnostic or  therapeutic service that the sedation supports,                            requiring the presence of an independent trained                            observer to assist in the monitoring of the                            patient's level of consciousness and physiological                            status; initial 15 minutes of intraservice time,                            patient age 15 years or older                           [phone], Moderate sedation services; each additional                            15 minutes intraservice time Diagnosis Code(s):        --- Professional ---                           Z12.11, Encounter for screening for malignant                            neoplasm of colon CPT copyright 2016 [employer]. All rights reserved. The codes documented in this report are preliminary and upon coder review may  be revised to meet current compliance requirements. Lionel December, MD Lionel December, MD 06/11/2016 1:18:27 PM This report has been signed electronically. Number of Addenda: 0

## 2016-06-11 NOTE — H&P (Signed)
Summer Christensen is an 65 y.o. female.   Chief Complaint: Patient is here for colonoscopy. HPI: Patient is 65 year old African-American female who is  undergoing screening colonoscopy. She denies abdominal pain or diarrhea. She has history of IBS and doing well with dicyclomine. Last colonoscopy was 14 years ago. Family history is negative for CRC.  Past Medical History:  Diagnosis Date  . 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 Chest pain   . Hx of hysterectomy   . Hypertension   . Migraine     Past Surgical History:  Procedure Laterality Date  . ABDOMINAL HYSTERECTOMY    . LAPAROSCOPIC CHOLECYSTECTOMY  2005   pain.  Marland Kitchen ROTATOR CUFF REPAIR  2009   arthroscopic  . TUBAL LIGATION     bilateral    History reviewed. No pertinent family history. Social History:  reports that she has never smoked. She has never used smokeless tobacco. She reports that she does not drink alcohol or use drugs.  Allergies: No Known Allergies  Medications Prior to Admission  Medication Sig Dispense Refill  . acetaminophen-codeine (TYLENOL #3) 300-30 MG per tablet Take 1-2 tablets by mouth every 6 (six) hours as needed for moderate pain. 60 tablet 2  . dicyclomine (BENTYL) 10 MG capsule Take 1 capsule (10 mg total) by mouth 2 (two) times daily. 60 capsule 3  . HYDROcodone-acetaminophen (NORCO/VICODIN) 5-325 MG per tablet Take 1 tablet by mouth every 6 (six) hours as needed for moderate pain. 56 tablet 0  . ibuprofen (ADVIL,MOTRIN) 200 MG tablet Take 200 mg by mouth every 6 (six) hours as needed.    Marland Kitchen losartan (COZAAR) 50 MG tablet Take 1 tablet (50 mg total) by mouth daily. 30 tablet 0  . Multiple Vitamins-Minerals (WOMENS MULTI VITAMIN & MINERAL) TABS Take 1 tablet by mouth as needed.     . nabumetone (RELAFEN) 500 MG tablet Take 1 tablet (500 mg total) by mouth 2 (two) times daily. 60 tablet 0  . polyethylene glycol-electrolytes (TRILYTE) 420 g solution Take 4,000 mLs by mouth once. 4000 mL 0    No  results found for this or any previous visit (from the past 48 hour(s)). No results found.  ROS  Blood pressure (!) 169/84, pulse 73, temperature 98.5 F (36.9 C), temperature source Oral, resp. rate 14, height 5\' 4"  (1.626 m), weight 149 lb (67.6 kg), SpO2 100 %. Physical Exam  Constitutional: She appears well-developed and well-nourished.  HENT:  Mouth/Throat: Oropharynx is clear and moist.  Eyes: Conjunctivae are normal. No scleral icterus.  Neck: No thyromegaly present.  Cardiovascular: Normal rate, regular rhythm and normal heart sounds.   No murmur heard. Respiratory: Effort normal and breath sounds normal.  GI: Soft. She exhibits no distension and no mass. There is no tenderness.  Musculoskeletal: She exhibits no edema.  Lymphadenopathy:    She has no cervical adenopathy.  Neurological: She is alert.  Skin: Skin is warm and dry.     Assessment/Plan Average risk screening colonoscopy.  Lionel December, MD 06/11/2016, 12:44 PM

## 2016-06-13 ENCOUNTER — Encounter (HOSPITAL_COMMUNITY): Payer: Self-pay | Admitting: Internal Medicine

## 2017-03-14 ENCOUNTER — Emergency Department (HOSPITAL_COMMUNITY)
Admission: EM | Admit: 2017-03-14 | Discharge: 2017-03-14 | Disposition: A | Discharge status: Home / Self Care | Payer: No Typology Code available for payment source | Attending: Emergency Medicine | Admitting: Emergency Medicine

## 2017-03-14 ENCOUNTER — Encounter (HOSPITAL_COMMUNITY): Payer: Self-pay | Admitting: Emergency Medicine

## 2017-03-14 DIAGNOSIS — M791 Myalgia: Secondary | ICD-10-CM | POA: Insufficient documentation

## 2017-03-14 DIAGNOSIS — I10 Essential (primary) hypertension: Secondary | ICD-10-CM | POA: Insufficient documentation

## 2017-03-14 DIAGNOSIS — M5412 Radiculopathy, cervical region: Secondary | ICD-10-CM | POA: Insufficient documentation

## 2017-03-14 DIAGNOSIS — M7918 Myalgia, other site: Secondary | ICD-10-CM

## 2017-03-14 DIAGNOSIS — Z79899 Other long term (current) drug therapy: Secondary | ICD-10-CM | POA: Insufficient documentation

## 2017-03-14 MED ORDER — KETOROLAC TROMETHAMINE 30 MG/ML IJ SOLN
30.0000 mg | Freq: Once | INTRAMUSCULAR | Status: AC
  Administered 2017-03-14: 30 mg via INTRAMUSCULAR
  Filled 2017-03-14: qty 1

## 2017-03-14 MED ORDER — HYDROCODONE-ACETAMINOPHEN 5-325 MG PO TABS: 1.0000 | ORAL_TABLET | ORAL | 0 refills | Status: DC | PRN

## 2017-03-14 MED ORDER — PREDNISONE 20 MG PO TABS: 40.0000 mg | ORAL_TABLET | Freq: Every day | ORAL | 0 refills | Status: DC

## 2017-03-14 NOTE — ED Triage Notes (Signed)
Pt states that the R side of her neck and R shoulder are sore. States she thinks it is her rotator cuff.

## 2017-03-14 NOTE — ED Provider Notes (Signed)
[facility] Provider Note   CSN: [id number] Arrival date & time: 03/14/17  4098     History   Chief Complaint Chief Complaint  Patient presents with  . Neck Pain    HPI Summer Christensen is a 66 y.o. female.  Patient presents with complaints of right-sided neck pain, right shoulder and arm pain. Symptoms present for couple of days. She had a leave work this morning because she does a lot of lifting with her right arm and the pain was worsening. She denies any direct injury. No numbness, tingling or weakness of the extremity.      Past Medical History:  Diagnosis Date  . 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 Chest pain   . Hx of hysterectomy   . Hypertension   . Migraine     Patient Active Problem List   Diagnosis Date Noted  . Hypertension   . LEFT BUNDLE BRANCH BLOCK 12/16/2010  . MIGRAINE HEADACHE 12/03/2010  . CHEST PAIN 12/03/2010    Past Surgical History:  Procedure Laterality Date  . ABDOMINAL HYSTERECTOMY    . COLONOSCOPY N/A 06/11/2016   Procedure: COLONOSCOPY;  Surgeon: Malissa Hippo, MD;  Location: [facility];  Service: Endoscopy;  Laterality: N/A;  2:00 - moved to 1:00 - Ann notified pt  . LAPAROSCOPIC CHOLECYSTECTOMY  2005   pain.  Marland Kitchen ROTATOR CUFF REPAIR  2009   arthroscopic  . TUBAL LIGATION     bilateral    OB History    No data available       Home Medications    Prior to Admission medications   Medication Sig Start Date End Date Taking? Authorizing Provider  losartan (COZAAR) 50 MG tablet Take 1 tablet (50 mg total) by mouth daily. 08/22/15  Yes Rolland Porter, MD  acetaminophen-codeine (TYLENOL #3) 300-30 MG per tablet Take 1-2 tablets by mouth every 6 (six) hours as needed for moderate pain. 01/24/15   Vickki Hearing, MD  dicyclomine (BENTYL) 10 MG capsule Take 1 capsule (10 mg total) by mouth 2 (two) times daily. 04/14/16   Setzer, Brand Males, NP  HYDROcodone-acetaminophen (NORCO/VICODIN) 5-325 MG tablet Take 1-2 tablets by  mouth every 4 (four) hours as needed. 03/14/17   Gilda Crease, MD  ibuprofen (ADVIL,MOTRIN) 200 MG tablet Take 200 mg by mouth every 6 (six) hours as needed.    [provider]  Multiple Vitamins-Minerals (WOMENS MULTI VITAMIN & MINERAL) TABS Take 1 tablet by mouth as needed.     [provider]  predniSONE (DELTASONE) 20 MG tablet Take 2 tablets (40 mg total) by mouth daily with breakfast. 03/14/17   Kayman Snuffer, Canary Brim, MD    Family History No family history on file.  Social History Social History  Substance Use Topics  . Smoking status: Never Smoker  . Smokeless tobacco: Never Used  . Alcohol use No     Allergies   Patient has no known allergies.   Review of Systems Review of Systems  Musculoskeletal: Positive for arthralgias and neck pain.  All other systems reviewed and are negative.    Physical Exam Updated Vital Signs BP (!) 179/80 (BP Location: Left Arm)   Pulse 72   Resp 20   Ht 5\' 4"  (1.626 m)   Wt 66.7 kg (147 lb)   SpO2 95%   BMI 25.23 kg/m   Physical Exam  Constitutional: She is oriented to person, place, and time. She appears well-developed and well-nourished. No distress.  HENT:  Head: Normocephalic and atraumatic.  Right Ear: Hearing normal.  Left Ear: Hearing normal.  Nose: Nose normal.  Mouth/Throat: Oropharynx is clear and moist and mucous membranes are normal.  Eyes: Conjunctivae and EOM are normal. Pupils are equal, round, and reactive to light.  Neck: Normal range of motion. Neck supple. Tracheal tenderness present.    Cardiovascular: Regular rhythm, S1 normal and S2 normal.  Exam reveals no gallop and no friction rub.   No murmur heard. Pulmonary/Chest: Effort normal and breath sounds normal. No respiratory distress. She exhibits no tenderness.  Abdominal: Soft. Normal appearance and bowel sounds are normal. There is no hepatosplenomegaly. There is no tenderness. There is no rebound, no guarding, no tenderness  at McBurney's point and negative Murphy's sign. No hernia.  Musculoskeletal: Normal range of motion.       Right shoulder: She exhibits tenderness. She exhibits normal range of motion.       Arms: Neurological: She is alert and oriented to person, place, and time. She has normal strength. No cranial nerve deficit or sensory deficit. Coordination normal. GCS eye subscore is 4. GCS verbal subscore is 5. GCS motor subscore is 6.  Skin: Skin is warm, dry and intact. No rash noted. No cyanosis.  Psychiatric: She has a normal mood and affect. Her speech is normal and behavior is normal. Thought content normal.  Nursing note and vitals reviewed.    ED Treatments / Results  Labs (all labs ordered are listed, but only abnormal results are displayed) Labs Reviewed - No data to display  EKG  EKG Interpretation None       Radiology No results found.  Procedures Procedures (including critical care time)  Medications Ordered in ED Medications  ketorolac (TORADOL) 30 MG/ML injection 30 mg (not administered)     Initial Impression / Assessment and Plan / ED Course  I have reviewed the triage vital signs and the nursing notes.  Pertinent labs & imaging results that were available during my care of the patient were reviewed by me and considered in my medical decision making (see chart for details).     Presents with complaints of pain on the right side of her neck down her right shoulder and arm. Patient has normal grip strength, normal distal sensation and motor function. There is tenderness in the right paraspinal muscles of the neck with diffuse tenderness of the right shoulder. This is consistent with soft tissue and musculoskeletal etiology, although cervical radiculopathy cannot be ruled out. Patient does not have any neurologic dysfunction, appropriate for her. Treatment and follow-up with Dr. Romeo Apple who has performed a previous rotator cuff repair on the other side.  Final  Clinical Impressions(s) / ED Diagnoses   Final diagnoses:  Musculoskeletal pain  Cervical radiculopathy    New Prescriptions New Prescriptions   HYDROCODONE-ACETAMINOPHEN (NORCO/VICODIN) 5-325 MG TABLET    Take 1-2 tablets by mouth every 4 (four) hours as needed.   PREDNISONE (DELTASONE) 20 MG TABLET    Take 2 tablets (40 mg total) by mouth daily with breakfast.     Gilda Crease, MD 03/14/17 [phone]

## 2017-03-20 MED FILL — Hydrocodone-Acetaminophen Tab 5-325 MG: ORAL | Qty: 6 | Status: AC

## 2017-04-07 ENCOUNTER — Encounter: Payer: Self-pay | Admitting: Orthopedic Surgery

## 2017-04-07 ENCOUNTER — Ambulatory Visit (INDEPENDENT_AMBULATORY_CARE_PROVIDER_SITE_OTHER): Payer: Managed Care, Other (non HMO)

## 2017-04-07 ENCOUNTER — Ambulatory Visit: Payer: Managed Care, Other (non HMO)

## 2017-04-07 ENCOUNTER — Ambulatory Visit (INDEPENDENT_AMBULATORY_CARE_PROVIDER_SITE_OTHER): Payer: Managed Care, Other (non HMO) | Admitting: Orthopedic Surgery

## 2017-04-07 VITALS — BP 164/96 | HR 79 | Wt 149.0 lb

## 2017-04-07 DIAGNOSIS — M542 Cervicalgia: Secondary | ICD-10-CM | POA: Diagnosis not present

## 2017-04-07 DIAGNOSIS — M47812 Spondylosis without myelopathy or radiculopathy, cervical region: Secondary | ICD-10-CM

## 2017-04-07 MED ORDER — PREDNISONE 10 MG PO TABS: 10.0000 mg | ORAL_TABLET | Freq: Every day | ORAL | 1 refills | Status: DC

## 2017-04-07 MED ORDER — TIZANIDINE HCL 4 MG PO TABS: 4.0000 mg | ORAL_TABLET | Freq: Every day | ORAL | 0 refills | Status: DC

## 2017-04-07 NOTE — Patient Instructions (Addendum)
Neck Exercises Neck exercises can be important for many reasons:  They can help you to improve and maintain flexibility in your neck. This can be especially important as you age.  They can help to make your neck stronger. This can make movement easier.  They can reduce or prevent neck pain.  They may help your upper back.  Ask your health care provider which neck exercises would be best for you. Exercises Neck Press Repeat this exercise 10 times. Do it first thing in the morning and right before bed or as told by your health care provider. 1. Lie on your back on a firm bed or on the floor with a pillow under your head. 2. Use your neck muscles to push your head down on the pillow and straighten your spine. 3. Hold the position as well as you can. Keep your head facing up and your chin tucked. 4. Slowly count to 5 while holding this position. 5. Relax for a few seconds. Then repeat.  Isometric Strengthening Do a full set of these exercises 2 times a day or as told by your health care provider. 1. Sit in a supportive chair and place your hand on your forehead. 2. Push forward with your head and neck while pushing back with your hand. Hold for 10 seconds. 3. Relax. Then repeat the exercise 3 times. 4. Next, do thesequence again, this time putting your hand against the back of your head. Use your head and neck to push backward against the hand pressure. 5. Finally, do the same exercise on either side of your head, pushing sideways against the pressure of your hand.  Prone Head Lifts Repeat this exercise 5 times. Do this 2 times a day or as told by your health care provider. 1. Lie face-down, resting on your elbows so that your chest and upper back are raised. 2. Start with your head facing downward, near your chest. Position your chin either on or near your chest. 3. Slowly lift your head upward. Lift until you are looking straight ahead. Then continue lifting your head as far back as  you can stretch. 4. Hold your head up for 5 seconds. Then slowly lower it to your starting position.  Supine Head Lifts Repeat this exercise 8-10 times. Do this 2 times a day or as told by your health care provider. 1. Lie on your back, bending your knees to point to the ceiling and keeping your feet flat on the floor. 2. Lift your head slowly off the floor, raising your chin toward your chest. 3. Hold for 5 seconds. 4. Relax and repeat.  Scapular Retraction Repeat this exercise 5 times. Do this 2 times a day or as told by your health care provider. 1. Stand with your arms at your sides. Look straight ahead. 2. Slowly pull both shoulders backward and downward until you feel a stretch between your shoulder blades in your upper back. 3. Hold for 10-30 seconds. 4. Relax and repeat.  Contact a health care provider if:  Your neck pain or discomfort gets much worse when you do an exercise.  Your neck pain or discomfort does not improve within 2 hours after you exercise. If you have any of these problems, stop exercising right away. Do not do the exercises again unless your health care provider says that you can. Get help right away if:  You develop sudden, severe neck pain. If this happens, stop exercising right away. Do not do the exercises again unless your   health care provider says that you can. Exercises Neck Stretch  Repeat this exercise 3-5 times. 1. Do this exercise while standing or while sitting in a chair. 2. Place your feet flat on the floor, shoulder-width apart. 3. Slowly turn your head to the right. Turn it all the way to the right so you can look over your right shoulder. Do not tilt or tip your head. 4. Hold this position for 10-30 seconds. 5. Slowly turn your head to the left, to look over your left shoulder. 6. Hold this position for 10-30 seconds.  Neck Retraction Repeat this exercise 8-10 times. Do this 3-4 times a day or as told by your health care  provider. 1. Do this exercise while standing or while sitting in a sturdy chair. 2. Look straight ahead. Do not bend your neck. 3. Use your fingers to push your chin backward. Do not bend your neck for this movement. Continue to face straight ahead. If you are doing the exercise properly, you will feel a slight sensation in your throat and a stretch at the back of your neck. 4. Hold the stretch for 1-2 seconds. Relax and repeat.  This information is not intended to replace advice given to you by your health care provider. Make sure you discuss any questions you have with your health care provider. Document Released: 09/17/2015 Document Revised: 03/13/2016 Document Reviewed: 04/16/2015 Elsevier Interactive Patient Education  2017 [employer]  

## 2017-04-07 NOTE — Progress Notes (Signed)
NEW PATIENT OFFICE VISIT    Chief Complaint  Patient presents with  . Neck Pain    with right side radicular pain    66 years old presents with a 1-2 week history of pain and stiffness on the right side of her neck radiating down to her elbow.  1-2 weeks, new onset, dull ache constant worse with rotation of the neck    Review of Systems  Skin: Negative for itching and rash.  Neurological: Negative for tingling, sensory change and focal weakness.     Current Outpatient Prescriptions:  .  acetaminophen-codeine (TYLENOL #3) 300-30 MG per tablet, Take 1-2 tablets by mouth every 6 (six) hours as needed for moderate pain., Disp: 60 tablet, Rfl: 2 .  losartan (COZAAR) 50 MG tablet, Take 1 tablet (50 mg total) by mouth daily., Disp: 30 tablet, Rfl: 0 .  Multiple Vitamins-Minerals (WOMENS MULTI VITAMIN & MINERAL) TABS, Take 1 tablet by mouth as needed. , Disp: , Rfl:   Past Medical History:  Diagnosis Date  . 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 Chest pain   . Hx of hysterectomy   . Hypertension   . Migraine     Past Surgical History:  Procedure Laterality Date  . ABDOMINAL HYSTERECTOMY    . COLONOSCOPY N/A 06/11/2016   Procedure: COLONOSCOPY;  Surgeon: Malissa Hippo, MD;  Location: [facility];  Service: Endoscopy;  Laterality: N/A;  2:00 - moved to 1:00 - Ann notified pt  . LAPAROSCOPIC CHOLECYSTECTOMY  2005   pain.  Marland Kitchen ROTATOR CUFF REPAIR  2009   arthroscopic  . TUBAL LIGATION     bilateral    No family history on file. Social History  Substance Use Topics  . Smoking status: Never Smoker  . Smokeless tobacco: Never Used  . Alcohol use No    BP (!) 164/96   Pulse 79   Wt 149 lb (67.6 kg)   BMI 25.58 kg/m   Physical Exam  Constitutional: She is oriented to person, place, and time. She appears well-developed and well-nourished.  Neck: Spinous process tenderness and muscular tenderness present. No neck rigidity. Decreased range of motion present. No edema  and no erythema present.  Neurological: She is alert and oriented to person, place, and time.  Normal strength in the right and left arm starting in the C5, 6, 7, 8 T1. Normal sensation in all nerves of both upper extremities. Pulses are 2+ in each radial artery. Tenderness in the upper extremities impingement sign right and left arm normal    Psychiatric: She has a normal mood and affect.  Vitals reviewed.   Back Exam   Tenderness  The patient is experiencing tenderness in the cervical (Cervical spine).  Reflexes  Biceps: normal  Other  Gait: normal       Meds ordered this encounter  Medications  . predniSONE (DELTASONE) 10 MG tablet    Sig: Take 1 tablet (10 mg total) by mouth daily.    Dispense:  30 tablet    Refill:  1  . tiZANidine (ZANAFLEX) 4 MG tablet    Sig: Take 1 tablet (4 mg total) by mouth at bedtime.    Dispense:  30 tablet    Refill:  0  X-rays show cervical spondylosis mild to moderate primarily between C5 and C7  Encounter Diagnoses  Name Primary?  . Neck pain   . Cervical spondylosis without myelopathy Yes     PLAN:   Muscle relaxer nsaid Neck exercises  6 week fu

## 2017-05-15 ENCOUNTER — Ambulatory Visit (INDEPENDENT_AMBULATORY_CARE_PROVIDER_SITE_OTHER): Payer: 59 | Admitting: Orthopedic Surgery

## 2017-05-15 DIAGNOSIS — M5020 Other cervical disc displacement, unspecified cervical region: Secondary | ICD-10-CM | POA: Diagnosis not present

## 2017-05-15 DIAGNOSIS — M47812 Spondylosis without myelopathy or radiculopathy, cervical region: Secondary | ICD-10-CM | POA: Diagnosis not present

## 2017-05-15 NOTE — Progress Notes (Signed)
Routine follow-up visit  Patient with chief complaint of neck and arm pain  History she is 66 years old she recently on had onset of neck and arm pain with numbness and tingling of the right upper extremity weakness in a dead feeling  Review of systems the pain is actually worse in the neck   Current Outpatient Prescriptions:  .  acetaminophen-codeine (TYLENOL #3) 300-30 MG per tablet, Take 1-2 tablets by mouth every 6 (six) hours as needed for moderate pain., Disp: 60 tablet, Rfl: 2 .  losartan (COZAAR) 50 MG tablet, Take 1 tablet (50 mg total) by mouth daily., Disp: 30 tablet, Rfl: 0 .  Multiple Vitamins-Minerals (WOMENS MULTI VITAMIN & MINERAL) TABS, Take 1 tablet by mouth as needed. , Disp: , Rfl:  .  tiZANidine (ZANAFLEX) 4 MG tablet, Take 1 tablet (4 mg total) by mouth at bedtime., Disp: 30 tablet, Rfl: 0 .  predniSONE (DELTASONE) 10 MG tablet, Take 1 tablet (10 mg total) by mouth daily. (Patient not taking: Reported on 05/15/2017), Disp: 30 tablet, Rfl: 1   She has done the standard 6 weeks of exercises in sense tizanidine and Tylenol with Codeine with no improvement  Her x-ray show cervical spondylosis We see normal soft tissues of anterior to the cervical spine. The C6 and C7 disc space show some narrowing there is no real subluxation dislocation. I do see a slight anterior position of C5 on C6. Clinical correlation is needed to further assess this there may be some degenerative arthritis of the facet joint at that level   On the anterior view we see coronal plane malalignment with mid level cervical spine uncovertebral joint spondylosis    impression overall mild to moderate spondylosis cervical spine   MRI shoulder IMPRESSION: 1. Mild tendinosis of the supraspinatus and infraspinatus tendon with a small insertional interstitial tear at the junction of the supraspinatus -infraspinatus tendon.     Electronically Signed   By: Elige Ko   On: 03/27/2015 08:30  Encounter  Diagnoses  Name Primary?  . Herniated disc, cervical Yes  . Cervical spondylosis without myelopathy     Continues to be checked for a herniated disc in the cervical spine or neural impingement/spinal stenosis  Recommend MRI cervical spine and then follow-up for review and further treatment recommendation

## 2017-05-15 NOTE — Addendum Note (Signed)
Addended by: Adella Hare B on: 05/15/2017 09:17 AM   Modules accepted: Orders

## 2017-05-26 ENCOUNTER — Ambulatory Visit (HOSPITAL_COMMUNITY)
Admission: RE | Admit: 2017-05-26 | Discharge: 2017-05-26 | Disposition: A | Discharge status: Home / Self Care | Payer: 59 | Source: Ambulatory Visit | Attending: Orthopedic Surgery | Admitting: Orthopedic Surgery

## 2017-05-26 DIAGNOSIS — M4802 Spinal stenosis, cervical region: Secondary | ICD-10-CM | POA: Diagnosis not present

## 2017-05-26 DIAGNOSIS — M50323 Other cervical disc degeneration at C6-C7 level: Secondary | ICD-10-CM | POA: Diagnosis not present

## 2017-05-26 DIAGNOSIS — M47812 Spondylosis without myelopathy or radiculopathy, cervical region: Secondary | ICD-10-CM

## 2017-05-26 DIAGNOSIS — M5020 Other cervical disc displacement, unspecified cervical region: Secondary | ICD-10-CM

## 2017-06-01 ENCOUNTER — Encounter: Payer: Self-pay | Admitting: Orthopedic Surgery

## 2017-06-01 ENCOUNTER — Ambulatory Visit (INDEPENDENT_AMBULATORY_CARE_PROVIDER_SITE_OTHER): Payer: 59 | Admitting: Orthopedic Surgery

## 2017-06-01 DIAGNOSIS — M4802 Spinal stenosis, cervical region: Secondary | ICD-10-CM | POA: Diagnosis not present

## 2017-06-01 NOTE — Patient Instructions (Signed)
The patient is being referred to neurosurgery for neck fusion if possible provide patient with light duty with decreased amount of lifting limiting her lifting to 25 pounds or less

## 2017-06-01 NOTE — Progress Notes (Signed)
Routine follow-up status post MRI cervical spine for ongoing cervical spine neck and arm pain   History she is 66 years old she recently on had onset of neck and arm pain with numbness and tingling of the right upper extremity weakness in a dead feeling  ROS  GAIT NORMAL   MRI report C4-C5: Left facet uncovertebral hypertrophy results in mild left neuroforaminal stenosis. No significant central spinal canal or right neuroforaminal stenosis.   C5-C6: Small posterior disc osteophyte complex and to bilateral facet uncovertebral hypertrophy resulting in moderate bilateral neuroforaminal stenosis. No significant central spinal canal stenosis.   C6-C7: Posterior disc osteophyte complex and bilateral uncovertebral hypertrophy resulting in mild central spinal canal stenosis and moderate bilateral neuroforaminal stenosis.   C7-T1: Bilateral facet uncovertebral hypertrophy resulting in mild bilateral neuroforaminal stenosis. No significant central spinal canal stenosis.   IMPRESSION: 1. Mild multilevel degenerative changes throughout the cervical spine as described above, worst at C5-C6 and C6-C7 where there is moderate bilateral neuroforaminal stenosis. 2. Mild central spinal canal stenosis at C6-C7.   Summary of MRI findings multilevel cervical spinal stenosis  Plain film We see normal soft tissues of anterior to the cervical spine. The C6 and C7 disc space show some narrowing there is no real subluxation dislocation. I do see a slight anterior position of C5 on C6. Clinical correlation is needed to further assess this there may be some degenerative arthritis of the facet joint at that level   On the anterior view we see coronal plane malalignment with mid level cervical spine uncovertebral joint spondylosis    impression overall mild to moderate spondylosis cervical spine   Encounter Diagnosis  Name Primary?  . Spinal stenosis of cervical region Yes    Referral to  neurosurgery

## 2017-06-02 ENCOUNTER — Ambulatory Visit: Payer: 59 | Admitting: Orthopedic Surgery

## 2017-07-23 ENCOUNTER — Other Ambulatory Visit: Payer: Self-pay | Admitting: Neurosurgery

## 2017-07-23 DIAGNOSIS — M50122 Cervical disc disorder at C5-C6 level with radiculopathy: Secondary | ICD-10-CM

## 2017-08-11 ENCOUNTER — Ambulatory Visit (HOSPITAL_COMMUNITY): Payer: 59 | Attending: Neurosurgery | Admitting: Physical Therapy

## 2017-08-11 ENCOUNTER — Encounter (HOSPITAL_COMMUNITY): Payer: Self-pay | Admitting: Physical Therapy

## 2017-08-11 DIAGNOSIS — R293 Abnormal posture: Secondary | ICD-10-CM | POA: Diagnosis present

## 2017-08-11 DIAGNOSIS — M25611 Stiffness of right shoulder, not elsewhere classified: Secondary | ICD-10-CM | POA: Insufficient documentation

## 2017-08-11 DIAGNOSIS — M6281 Muscle weakness (generalized): Secondary | ICD-10-CM | POA: Insufficient documentation

## 2017-08-11 DIAGNOSIS — M542 Cervicalgia: Secondary | ICD-10-CM

## 2017-08-11 NOTE — Therapy (Signed)
[facility] [address] [place], [place], [address] Phone: [phone]   Fax:  [phone]  Physical Therapy Evaluation  Patient Details  Name: Summer Christensen MRN: [id number] Date of Birth: [birth year] Referring Provider: Lisbeth Renshaw   Encounter Date: 08/11/2017      PT End of Session - 08/11/17 1146    Visit Number 1   Number of Visits 13   Date for PT Re-Evaluation 09/01/17   Authorization Type Aeta NAP (26 visits, 0 used at eval)   Authorization Time Period 08/11/17 to 09/22/17   Authorization - Visit Number 1   Authorization - Number of Visits 26   PT Start Time 0818   PT Stop Time 0858   PT Time Calculation (min) 40 min   Activity Tolerance Patient tolerated treatment well   Behavior During Therapy [facility] for tasks assessed/performed      Past Medical History:  Diagnosis Date  . 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 Chest pain   . Hx of hysterectomy   . Hypertension   . Migraine     Past Surgical History:  Procedure Laterality Date  . ABDOMINAL HYSTERECTOMY    . COLONOSCOPY N/A 06/11/2016   Procedure: COLONOSCOPY;  Surgeon: Malissa Hippo, MD;  [facility];  Service: Endoscopy;  Laterality: N/A;  2:00 - moved to 1:00 - Ann notified pt  . LAPAROSCOPIC CHOLECYSTECTOMY  2005   pain.  Marland Kitchen ROTATOR CUFF REPAIR  2009   arthroscopic  . TUBAL LIGATION     bilateral    There were no vitals filed for this visit.       Subjective Assessment - 08/11/17 6962    Subjective Patient arrives only stating "I have four discs", it appears her MD has not really told her much about what is going on besides she has four discs and she might need surgery, they are going to try to infuse discs first. Her neck hurts all the time, no matter what she does, and especially when she turns to the right. No numbness or tingling anywhere but she has had some difficulty in opening jars/problems with her grip.    Pertinent History  history of L rotator cuff repair 2009   How long can you sit comfortably? not sure but it does limit her   How long can you stand comfortably? unlimited    How long can you walk comfortably? unlimited unless she has to turn her head when she is walking    Patient Stated Goals avoid surgery    Currently in Pain? No/denies  depends on her position/ self-chosen position is 0/10, other positions can be very high on the pain scale             [facility] PT Assessment - 08/11/17 0001      Assessment   Medical Diagnosis cervical disc disorder    Referring Provider Lisbeth Renshaw    Onset Date/Surgical Date --  May 2018   Next MD Visit unsure    Prior Therapy none      Precautions   Precautions None     Restrictions   Weight Bearing Restrictions No     Balance Screen   Has the patient fallen in the past 6 months No   Has the patient had a decrease in activity level because of a fear of falling?  No   Is the patient reluctant to leave their home because of a fear of falling?  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, pulling containers      Observation/Other Assessments   Observations cervical distraction/traction both negative; empty can test mild positive R      AROM   Overall AROM Comments PROM equal to AROM for R shoulder flexion; PROM R shoulder ABD full ROM    Right Shoulder Flexion 130 Degrees   Right Shoulder ABduction 90 Degrees   Right Shoulder Internal Rotation --  T11   Right Shoulder External Rotation --  T2   Left Shoulder Flexion --  WFL    Left Shoulder ABduction --  [facility]    Left Shoulder Internal Rotation --  T12   Left Shoulder External Rotation --  T3   Cervical Flexion 75   Cervical Extension 15  painful thoracic musculature    Cervical - Right Side Bend 25   Cervical -  Left Side Bend 25  painful right cervical musculature    Cervical - Right Rotation 40  painful    Cervical - Left Rotation 40     Strength   Overall Strength Comments functional grip strength weaker R per manual testing    Right Shoulder Flexion 5/5   Right Shoulder ABduction 3/5  painful    Right Shoulder Internal Rotation 4+/5   Right Shoulder External Rotation 4+/5   Left Shoulder Flexion 5/5   Left Shoulder ABduction 5/5   Left Shoulder Internal Rotation 4/5   Left Shoulder External Rotation 4/5   Right Elbow Flexion 4+/5   Right Elbow Extension 3+/5   Left Elbow Flexion 4+/5   Left Elbow Extension 3+/5     Palpation   Palpation comment significant nknotting/trigger points noted R cervical musculature and upper traps, subscapularis             Objective measurements completed on examination: See above findings.          [facility] Adult PT Treatment/Exercise - 08/11/17 0001      Exercises   Exercises Neck     Neck Exercises: Seated   Neck Retraction 10 reps     Neck Exercises: Stretches   Upper Trapezius Stretch 30 seconds;2 reps   Levator Stretch 2 reps;30 seconds   Other Neck Stretches SCM stretch 1x30 seconds                 PT Education - 08/11/17 1142    Education provided Yes   Education Details prognosis, exam findings, POC, HEP    Person(s) Educated Patient   Methods Explanation;Demonstration;Handout   Comprehension Verbalized understanding;Returned demonstration;Need further instruction          PT Short Term Goals - 08/11/17 1150      PT SHORT TERM GOAL #1   Title Patient to be able to maintain correct posture at least 70% of the time in order to assist in improving mechanics and reducing pain    Time 3   Period Weeks   Status New   Target Date 09/01/17     PT SHORT TERM GOAL #2   Title Patient to show that cervical ROM has improved by 15 degrees on all planes in order to assist in reducing pain    Time 3   Period Weeks    Status New     PT SHORT TERM GOAL #3   Title Patient to  show an improvement of at least 15 degrees in R shoulder flexion and abduction, as well as a 2 vertebral improvement in R shoulder ER/IR in order to assist in reducing pain    Time 3   Period Weeks   Status New     PT SHORT TERM GOAL #4   Title Patient to be compliant with correct performance of HEP, to be updated as appropriate    Time 1   Period Weeks   Status New   Target Date 08/18/17           PT Long Term Goals - 08/11/17 1153      PT LONG TERM GOAL #1   Title Patient to improve all tested groups by 1 MMT grade in order to assist in reducing pain with functional task performance   Time 6   Period Weeks   Status New   Target Date 09/22/17     PT LONG TERM GOAL #2   Title Patient to demonstrate that cervical and R shoulder ROM are within 15 degrees of full in order to assist in reducing pain and improving mechanics    Time 6   Period Weeks   Status New     PT LONG TERM GOAL #3   Title Patient to experience pain as being no more than 2/10 in cervical spine in order to improve work tolerance and QOL    Time 6   Period Weeks   Status New     PT LONG TERM GOAL #4   Title Patient to demonstrate at least a 50% reduction in muscle knotting and trigger points in order to reduce pain and compensation patterns    Time 6   Period Weeks   Status New                Plan - 08/11/17 1147    Clinical Impression Statement Patient arrives with cervical pain which has been present for about the past 8 months; she is very concerned about avoiding surgery as her doctors have told her that she may ultimately need this. Examination reveals significant postural limitations, severe muscle knotting/trigger points/guarding pattern, impaired cervical and shoulder ROM, functional thoracic stiffness, scapular hypomobility, functional muscle weakness, and reduced tolerance to functional task performance. Patient will benefit from  skilled PT services to address functional deficits, reduce pain, and achieve optimal level of function moving forward.    History and Personal Factors relevant to plan of care: history L rotator cuff repair    Clinical Presentation Stable   Clinical Presentation due to: biomechanical factors, guarding and compensation pattern    Clinical Decision Making Low   Rehab Potential Good   Clinical Impairments Affecting Rehab Potential (+) motivated; (-) chronicity of pain, significant guarding pattern    PT Frequency 2x / week   PT Duration 6 weeks   PT Treatment/Interventions ADLs/Self Care Home Management;Cryotherapy;Moist Heat;Electrical Stimulation;Functional mobility training;Therapeutic activities;Therapeutic exercise;Balance training;Neuromuscular re-education;Patient/family education;Manual techniques;Passive range of motion;Dry needling;Taping   PT Next Visit Plan review initial eval and goals, HEP; postural training, thoracic and cervical excursions, heavy manual focus. Consider dry needling.    PT Home Exercise Plan Eval: upper trap stretch, levator stretch, SCM stretch, chin tucks    Consulted and Agree with Plan of Care Patient      Patient will benefit from skilled therapeutic intervention in order to improve the following deficits and impairments:  Increased fascial restricitons, Pain, Improper body mechanics, Decreased coordination, Decreased mobility, Increased muscle spasms, Postural dysfunction,  Decreased range of motion, Decreased strength, Hypomobility, Impaired flexibility  Visit Diagnosis: Cervicalgia - Plan: PT plan of care cert/re-cert  Abnormal posture - Plan: PT plan of care cert/re-cert  Stiffness of right shoulder, not elsewhere classified - Plan: PT plan of care cert/re-cert  Muscle weakness (generalized) - Plan: PT plan of care cert/re-cert     Problem List Patient Active Problem List   Diagnosis Date Noted  . Hypertension   . LEFT BUNDLE BRANCH BLOCK  12/16/2010  . MIGRAINE HEADACHE 12/03/2010  . CHEST PAIN 12/03/2010    Nedra Hai PT, DPT [phone]  [facility] [address] [place], [place], [address] Phone: [phone]   Fax:  [phone]  Name: Summer Christensen MRN: [id number] Date of Birth: Mar 12, 1951

## 2017-08-11 NOTE — Patient Instructions (Signed)
    UPPER TRAP STRETCH - HOLDING CHAIR  While sitting in a chair, hold the seat with one hand and bend your head towards the opposite side for a gentle stretch to the side of the neck.  Make sure you are just using your neck, not leaning with your whole body.  Repeat 3 times each side, twice a day.     Levator Scap Stretch   Sitting upright, tilt your head downward and towards your armpit. Hold once you feel a gentle stretch. Perform on both sides.   Hold for 30 seconds.   Repeat 3 times both sides, twice a day.    SCM Stretch  1. With head in neutral position, place both hands around the front of your throat  2. Drag down slack skin towards your collar bones  3. Slowly tilt head back and look up  4. You should feel a strong stretch at the front of your neck  5. Hold position for 30 seconds,then return head to neutral position  Repeat 3 times, twice a day.    RETRACTION / CHIN TUCK  Slowly draw your head back so that your ears line up with your shoulders.  Repeat 10 times, twice a day.

## 2017-08-19 ENCOUNTER — Ambulatory Visit (HOSPITAL_COMMUNITY): Payer: 59

## 2017-08-19 ENCOUNTER — Encounter (HOSPITAL_COMMUNITY): Payer: Self-pay

## 2017-08-19 DIAGNOSIS — M6281 Muscle weakness (generalized): Secondary | ICD-10-CM

## 2017-08-19 DIAGNOSIS — M542 Cervicalgia: Secondary | ICD-10-CM

## 2017-08-19 DIAGNOSIS — M25611 Stiffness of right shoulder, not elsewhere classified: Secondary | ICD-10-CM

## 2017-08-19 DIAGNOSIS — R293 Abnormal posture: Secondary | ICD-10-CM

## 2017-08-19 NOTE — Therapy (Signed)
[facility] [address] [place], [place], [address] Phone: [phone]   Fax:  [phone]  Physical Therapy Treatment  Patient Details  Name: Summer Christensen MRN: [id number] Date of Birth: 11-15-1950 Referring Provider: Lisbeth Renshaw   Encounter Date: 08/19/2017      PT End of Session - 08/19/17 1001    Visit Number 2   Number of Visits 13   Date for PT Re-Evaluation 09/01/17   Authorization Type Aeta NAP (26 visits, 0 used at eval)   Authorization Time Period 08/11/17 to 09/22/17   Authorization - Visit Number 2   Authorization - Number of Visits 26   PT Start Time 0950   PT Stop Time 1030   PT Time Calculation (min) 40 min   Activity Tolerance Patient tolerated treatment well   Behavior During Therapy [facility] for tasks assessed/performed      Past Medical History:  Diagnosis Date  . 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 Chest pain   . Hx of hysterectomy   . Hypertension   . Migraine     Past Surgical History:  Procedure Laterality Date  . ABDOMINAL HYSTERECTOMY    . COLONOSCOPY N/A 06/11/2016   Procedure: COLONOSCOPY;  Surgeon: Malissa Hippo, MD;  [facility];  Service: Endoscopy;  Laterality: N/A;  2:00 - moved to 1:00 - Ann notified pt  . LAPAROSCOPIC CHOLECYSTECTOMY  2005   pain.  Marland Kitchen ROTATOR CUFF REPAIR  2009   arthroscopic  . TUBAL LIGATION     bilateral    There were no vitals filed for this visit.      Subjective Assessment - 08/19/17 0954    Subjective Pt stated she has soreness on Rt side of neck, has began some of the exercises but also trying different form with work to assist.  Pain scale 5/10 Rt side neck   Pertinent History history of L rotator cuff repair 2009   Patient Stated Goals avoid surgery    Currently in Pain? Yes   Pain Score 5    Pain Location Neck   Pain Orientation Right   Pain Descriptors / Indicators Sore   Pain Onset More than a month ago   Pain Frequency Constant    Aggravating Factors  Lifting and movements   Pain Relieving Factors pain medication                         [facility] Adult PT Treatment/Exercise - 08/19/17 0001      Neck Exercises: Seated   Neck Retraction 10 reps   Neck Retraction Limitations 3" holds   Postural Training Educated on importance of seated posture; instructed lumbar roll assistance     Manual Therapy   Manual Therapy Soft tissue mobilization   Manual therapy comments Manual complete separate than rest of tx   Soft tissue mobilization Prone STM to cervical and periscapular mm     Neck Exercises: Stretches   Upper Trapezius Stretch 2 reps;30 seconds  cueing for form   Other Neck Stretches SCM stretch 3x 30 seconds  (reviewed form with HEP)                PT Education - 08/19/17 1001    Education provided Yes   Education Details Reviewed goals, assured complaince and educated correct form/technqiue with HEP, copy of eval given to pt.     Person(s) Educated Patient   Methods Explanation;Demonstration;Verbal cues;Handout  Comprehension Verbalized understanding;Returned demonstration;Tactile cues required;Need further instruction          PT Short Term Goals - 08/11/17 1150      PT SHORT TERM GOAL #1   Title Patient to be able to maintain correct posture at least 70% of the time in order to assist in improving mechanics and reducing pain    Time 3   Period Weeks   Status New   Target Date 09/01/17     PT SHORT TERM GOAL #2   Title Patient to show that cervical ROM has improved by 15 degrees on all planes in order to assist in reducing pain    Time 3   Period Weeks   Status New     PT SHORT TERM GOAL #3   Title Patient to show an improvement of at least 15 degrees in R shoulder flexion and abduction, as well as a 2 vertebral improvement in R shoulder ER/IR in order to assist in reducing pain    Time 3   Period Weeks   Status New     PT SHORT TERM GOAL #4   Title Patient to be  compliant with correct performance of HEP, to be updated as appropriate    Time 1   Period Weeks   Status New   Target Date 08/18/17           PT Long Term Goals - 08/11/17 1153      PT LONG TERM GOAL #1   Title Patient to improve all tested groups by 1 MMT grade in order to assist in reducing pain with functional task performance   Time 6   Period Weeks   Status New   Target Date 09/22/17     PT LONG TERM GOAL #2   Title Patient to demonstrate that cervical and R shoulder ROM are within 15 degrees of full in order to assist in reducing pain and improving mechanics    Time 6   Period Weeks   Status New     PT LONG TERM GOAL #3   Title Patient to experience pain as being no more than 2/10 in cervical spine in order to improve work tolerance and QOL    Time 6   Period Weeks   Status New     PT LONG TERM GOAL #4   Title Patient to demonstrate at least a 50% reduction in muscle knotting and trigger points in order to reduce pain and compensation patterns    Time 6   Period Weeks   Status New               Plan - 08/19/17 1253    Clinical Impression Statement Reviewed goals, assured compliance and instructed proper form with HEP retraction and stretches, copy of eval given to pt.  Session focus on improving form with HEP for maximal benefits, postural education/strengthening and manual technqiues to address pain.  Moderate verbal and tactile cueing required with cervical retraction to improve form.  EOS with manual soft tissue mobilization to address moderate/sever knotting with cervical and upper thoracic region.  Pt reports pain reduced to 2-3/10 with improve cervial mobility folllowing manual.     Rehab Potential Good   Clinical Impairments Affecting Rehab Potential (+) motivated; (-) chronicity of pain, significant guarding pattern    PT Frequency 2x / week   PT Duration 6 weeks   PT Treatment/Interventions ADLs/Self Care Home Management;Cryotherapy;Moist  Heat;Electrical Stimulation;Functional mobility training;Therapeutic activities;Therapeutic exercise;Balance training;Neuromuscular re-education;Patient/family education;Manual  techniques;Passive range of motion;Dry needling;Taping   PT Next Visit Plan Next session heavy manual focus and postural training, thoracic and cervical excursions. Consider dry needling. Add 3D cervical/thoracic excursion next session.     PT Home Exercise Plan Eval: upper trap stretch, levator stretch, SCM stretch, chin tucks       Patient will benefit from skilled therapeutic intervention in order to improve the following deficits and impairments:  Increased fascial restricitons, Pain, Improper body mechanics, Decreased coordination, Decreased mobility, Increased muscle spasms, Postural dysfunction, Decreased range of motion, Decreased strength, Hypomobility, Impaired flexibility  Visit Diagnosis: Cervicalgia  Abnormal posture  Stiffness of right shoulder, not elsewhere classified  Muscle weakness (generalized)     Problem List Patient Active Problem List   Diagnosis Date Noted  . Hypertension   . LEFT BUNDLE BRANCH BLOCK 12/16/2010  . MIGRAINE HEADACHE 12/03/2010  . CHEST PAIN 12/03/2010   Becky Saxasey Shemeka Wardle, LPTA; CBIS [phone]  Juel BurrowCockerham, Quierra Silverio Jo 08/19/2017, 1:55 PM  [facility] [address] [place], [place], [id number] Phone: [phone]   Fax:  [phone]  Name: Summer Christensen MRN: [id number] Date of Birth: 03/27/1951

## 2017-08-21 ENCOUNTER — Telehealth (HOSPITAL_COMMUNITY): Payer: Self-pay

## 2017-08-21 ENCOUNTER — Ambulatory Visit (HOSPITAL_COMMUNITY): Payer: 59 | Attending: Neurosurgery

## 2017-08-21 DIAGNOSIS — R293 Abnormal posture: Secondary | ICD-10-CM | POA: Insufficient documentation

## 2017-08-21 DIAGNOSIS — M6281 Muscle weakness (generalized): Secondary | ICD-10-CM | POA: Insufficient documentation

## 2017-08-21 DIAGNOSIS — M25611 Stiffness of right shoulder, not elsewhere classified: Secondary | ICD-10-CM | POA: Insufficient documentation

## 2017-08-21 DIAGNOSIS — M542 Cervicalgia: Secondary | ICD-10-CM | POA: Insufficient documentation

## 2017-08-21 NOTE — Telephone Encounter (Signed)
No Show #1: left pt message regarding missed appointment. Reminded pt of next appointment and told her to call if she can't make it.   Summer Christensen PT, DPT

## 2017-08-25 ENCOUNTER — Ambulatory Visit (HOSPITAL_COMMUNITY): Payer: 59 | Admitting: Physical Therapy

## 2017-08-25 ENCOUNTER — Encounter (HOSPITAL_COMMUNITY): Payer: Self-pay | Admitting: Physical Therapy

## 2017-08-25 DIAGNOSIS — M6281 Muscle weakness (generalized): Secondary | ICD-10-CM

## 2017-08-25 DIAGNOSIS — M25611 Stiffness of right shoulder, not elsewhere classified: Secondary | ICD-10-CM

## 2017-08-25 DIAGNOSIS — R293 Abnormal posture: Secondary | ICD-10-CM

## 2017-08-25 DIAGNOSIS — M542 Cervicalgia: Secondary | ICD-10-CM

## 2017-08-25 NOTE — Therapy (Signed)
[facility] [address] [place], [place], [address] Phone: [phone]   Fax:  [phone]  Physical Therapy Treatment  Patient Details  Name: Summer Christensen MRN: [id number] Date of Birth: December 29, 1950 Referring Provider: Lisbeth Renshaw    Encounter Date: 08/25/2017  PT End of Session - 08/25/17 1028    Visit Number  3    Number of Visits  13    Date for PT Re-Evaluation  09/01/17    Authorization Type  [employer] (26 visits, 0 used at eval)    Authorization Time Period  08/11/17 to 09/22/17    Authorization - Visit Number  3    Authorization - Number of Visits  26    PT Start Time  0950    PT Stop Time  1030    PT Time Calculation (min)  40 min    Activity Tolerance  Patient tolerated treatment well    Behavior During Therapy  [facility] for tasks assessed/performed       Past Medical History:  Diagnosis Date  . 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 Chest pain   . Hx of hysterectomy   . Hypertension   . Migraine     Past Surgical History:  Procedure Laterality Date  . ABDOMINAL HYSTERECTOMY    . LAPAROSCOPIC CHOLECYSTECTOMY  2005   pain.  Marland Kitchen ROTATOR CUFF REPAIR  2009   arthroscopic  . TUBAL LIGATION     bilateral    There were no vitals filed for this visit.  Subjective Assessment - 08/25/17 0952    Subjective  States she worked this weekend and did not have trouble there. Felt good after last therapy session.     Currently in Pain?  Yes    Pain Score  2     Pain Location  Shoulder right shoulder/neck, goes down to elbow   right shoulder/neck, goes down to elbow   Pain Orientation  Right    Pain Descriptors / Indicators  Radiating;Sore    Pain Type  Chronic pain    Pain Radiating Towards  towards elbow                      [facility] Adult PT Treatment/Exercise - 08/25/17 0001      Neck Exercises: Seated   Neck Retraction  10 reps hold for 2 seconds, finger on chin and chest for tactile cue   hold for 2  seconds, finger on chin and chest for tactile cue   Cervical Rotation  10 reps;Both holding cervical chin tuck   holding cervical chin tuck   Shoulder Rolls  10 reps tactile cues between scapulas to facilitate scap retraction   tactile cues between scapulas to facilitate scap retraction   Other Seated Exercise  cervical/thoracic excursions all planes 10 reps   10 reps     Neck Exercises: Prone   W Back  10 reps tactile cues for scapular retraction   tactile cues for scapular retraction   Upper Extremity Flexion with Stabilization  Flexion;10 reps "I's"   "I's"   Other Prone Exercise  prone "I's" with cervical chin tuck ; 10 reps      Manual Therapy   Manual Therapy  Soft tissue mobilization    Manual therapy comments  Manual complete separate than rest of tx    Soft tissue mobilization  seated STM to upper trapezius trigger points in upper trapezius and T1-3 paraspinals   trigger points in  upper trapezius and T1-3 paraspinals            PT Education - 08/25/17 1027    Education provided  No       PT Short Term Goals - 08/11/17 1150      PT SHORT TERM GOAL #1   Title  Patient to be able to maintain correct posture at least 70% of the time in order to assist in improving mechanics and reducing pain     Time  3    Period  Weeks    Status  New    Target Date  09/01/17      PT SHORT TERM GOAL #2   Title  Patient to show that cervical ROM has improved by 15 degrees on all planes in order to assist in reducing pain     Time  3    Period  Weeks    Status  New      PT SHORT TERM GOAL #3   Title  Patient to show an improvement of at least 15 degrees in R shoulder flexion and abduction, as well as a 2 vertebral improvement in R shoulder ER/IR in order to assist in reducing pain     Time  3    Period  Weeks    Status  New      PT SHORT TERM GOAL #4   Title  Patient to be compliant with correct performance of HEP, to be updated as appropriate     Time  1    Period  Weeks     Status  New    Target Date  08/18/17        PT Long Term Goals - 08/11/17 1153      PT LONG TERM GOAL #1   Title  Patient to improve all tested groups by 1 MMT grade in order to assist in reducing pain with functional task performance    Time  6    Period  Weeks    Status  New    Target Date  09/22/17      PT LONG TERM GOAL #2   Title  Patient to demonstrate that cervical and R shoulder ROM are within 15 degrees of full in order to assist in reducing pain and improving mechanics     Time  6    Period  Weeks    Status  New      PT LONG TERM GOAL #3   Title  Patient to experience pain as being no more than 2/10 in cervical spine in order to improve work tolerance and QOL     Time  6    Period  Weeks    Status  New      PT LONG TERM GOAL #4   Title  Patient to demonstrate at least a 50% reduction in muscle knotting and trigger points in order to reduce pain and compensation patterns     Time  6    Period  Weeks    Status  New            Plan - 08/25/17 1025    Clinical Impression Statement  Patient is progressing gradually in therapy. She continues to require verbal and tactile cues to maintain appropriate exercise posture for when reviewing HEP. Therapy focused on improving ROM through exercises to improve activation of deep cervical flexors and limit hyperactivation of upper trapezius. Patient has multiple trigger points along her thoracic paraspinals and upper trapezius and  will benefit from continued skilled PT to address ROM and strength imbalances. She may benefit from dry needling to improve muscle mobility; will discuss next visist.    Rehab Potential  Good    PT Frequency  2x / week    PT Duration  6 weeks    PT Treatment/Interventions  ADLs/Self Care Home Management;Cryotherapy;Moist Heat;Electrical Stimulation;Functional mobility training;Therapeutic activities;Therapeutic exercise;Balance training;Neuromuscular re-education;Patient/family education;Manual  techniques;Passive range of motion;Dry needling;Taping    PT Next Visit Plan  Next session heavy manual focus and postural training, thoracic and cervical excursions. Consider dry needling. Add 3D cervical/thoracic excursion next session.      PT Home Exercise Plan  Eval: upper trap stretch, levator stretch, SCM stretch, chin tucks     Consulted and Agree with Plan of Care  Patient       Patient will benefit from skilled therapeutic intervention in order to improve the following deficits and impairments:  Increased fascial restricitons, Pain, Improper body mechanics, Decreased coordination, Decreased mobility, Increased muscle spasms, Postural dysfunction, Decreased range of motion, Decreased strength, Hypomobility, Impaired flexibility  Visit Diagnosis: Cervicalgia  Abnormal posture  Stiffness of right shoulder, not elsewhere classified  Muscle weakness (generalized)     Problem List Patient Active Problem List   Diagnosis Date Noted  . Hypertension   . LEFT BUNDLE BRANCH BLOCK 12/16/2010  . MIGRAINE HEADACHE 12/03/2010  . CHEST PAIN 12/03/2010    Nedra Hai PT, DPT [phone]  [facility] [address] [place], [place], [address] Phone: [phone]   Fax:  [phone]  Name: AZELL GASCHLER MRN: [id number] Date of Birth: [birth year]

## 2017-08-27 ENCOUNTER — Encounter (HOSPITAL_COMMUNITY): Payer: Self-pay

## 2017-08-27 ENCOUNTER — Ambulatory Visit (HOSPITAL_COMMUNITY): Payer: 59

## 2017-08-27 ENCOUNTER — Other Ambulatory Visit: Payer: Self-pay

## 2017-08-27 DIAGNOSIS — M542 Cervicalgia: Secondary | ICD-10-CM | POA: Diagnosis not present

## 2017-08-27 DIAGNOSIS — R293 Abnormal posture: Secondary | ICD-10-CM

## 2017-08-27 DIAGNOSIS — M6281 Muscle weakness (generalized): Secondary | ICD-10-CM

## 2017-08-27 DIAGNOSIS — M25611 Stiffness of right shoulder, not elsewhere classified: Secondary | ICD-10-CM

## 2017-08-27 NOTE — Therapy (Addendum)
[facility] [address] [place], [place], [address] Phone: [phone]   Fax:  [phone]  Physical Therapy Treatment  Patient Details  Name: Summer Christensen MRN: [id number] Date of Birth: November 02, 1950 Referring Provider: Lisbeth Renshaw    Encounter Date: 08/27/2017  PT End of Session - 08/27/17 0951    Visit Number  4    Number of Visits  13    Date for PT Re-Evaluation  09/01/17    Authorization Type  [employer] (26 visits, 0 used at eval)    Authorization Time Period  08/11/17 to 09/22/17    Authorization - Visit Number  4    Authorization - Number of Visits  26    PT Start Time  [phone] patient arrived late   patient arrived late   PT Stop Time  0945    PT Time Calculation (min)  29 min    Activity Tolerance  Patient tolerated treatment well    Behavior During Therapy  [facility] for tasks assessed/performed       Past Medical History:  Diagnosis Date  . 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 Chest pain   . Hx of hysterectomy   . Hypertension   . Migraine     Past Surgical History:  Procedure Laterality Date  . ABDOMINAL HYSTERECTOMY    . LAPAROSCOPIC CHOLECYSTECTOMY  2005   pain.  Marland Kitchen ROTATOR CUFF REPAIR  2009   arthroscopic  . TUBAL LIGATION     bilateral    There were no vitals filed for this visit.  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 Patient stating she needs another FMLA paper filled out for work. She is feeling ok overall but has pain.    Currently in Pain?  Yes    Pain Score  8     Pain Location  Shoulder    Pain Orientation  Right    Pain Descriptors / Indicators  Aching;Sore    Pain Type  Chronic pain    Pain Onset  More than a month ago    Pain Frequency  Constant    Aggravating Factors   lifitng at work    Pain Relieving Factors  nothing helping                       [facility] Adult PT Treatment/Exercise - 08/27/17 0001      Neck Exercises: Machines for Strengthening   UBE (Upper Arm Bike)  4 mins on level 1, retrograde      Neck Exercises: Seated   Other Seated Exercise  shoulder external rotation with red theraband, 2x 10 reps      Neck Exercises: Supine   Neck Retraction  10 reps;5 secs 2 sets, with towel under chin for tactile cue   2 sets, with towel under chin for tactile cue     Neck Exercises: Prone   Other Prone Exercise  prone I's and T's, 10 reps      Neck Exercises: Stretches   Upper Trapezius Stretch  3 reps;30 seconds    Other Neck Stretches  SCM stretch 1x 30 seconds bilaterally             PT Education - 08/27/17 0950    Education provided  Yes    Education Details  HEP  review, improtance of compliance, technique for exrecises    Person(s) Educated  Patient    Methods  Explanation;Demonstration    Comprehension  Verbalized understanding;Need further instruction;Returned demonstration       PT Short Term Goals - 08/11/17 1150      PT SHORT TERM GOAL #1   Title  Patient to be able to maintain correct posture at least 70% of the time in order to assist in improving mechanics and reducing pain     Time  3    Period  Weeks    Status  New    Target Date  09/01/17      PT SHORT TERM GOAL #2   Title  Patient to show that cervical ROM has improved by 15 degrees on all planes in order to assist in reducing pain     Time  3    Period  Weeks    Status  New      PT SHORT TERM GOAL #3   Title  Patient to show an improvement of at least 15 degrees in R shoulder flexion and abduction, as well as a 2 vertebral improvement in R shoulder ER/IR in order to assist in reducing pain     Time  3    Period  Weeks    Status  New      PT SHORT TERM GOAL #4   Title  Patient to be compliant with correct performance of HEP, to be updated as appropriate     Time  1    Period  Weeks    Status  New     Target Date  08/18/17        PT Long Term Goals - 08/11/17 1153      PT LONG TERM GOAL #1   Title  Patient to improve all tested groups by 1 MMT grade in order to assist in reducing pain with functional task performance    Time  6    Period  Weeks    Status  New    Target Date  09/22/17      PT LONG TERM GOAL #2   Title  Patient to demonstrate that cervical and R shoulder ROM are within 15 degrees of full in order to assist in reducing pain and improving mechanics     Time  6    Period  Weeks    Status  New      PT LONG TERM GOAL #3   Title  Patient to experience pain as being no more than 2/10 in cervical spine in order to improve work tolerance and QOL     Time  6    Period  Weeks    Status  New      PT LONG TERM GOAL #4   Title  Patient to demonstrate at least a 50% reduction in muscle knotting and trigger points in order to reduce pain and compensation patterns     Time  6    Period  Weeks    Status  New            Plan - 08/27/17 [id number]    Clinical Impression Statement  Patient is progressing gradually in therapy. She continues to require verbal and tactile cues to maintain appropriate exercise posture for when reviewing HEP. Therapy focused on improving posture and body mechanics with exercises today. Focus is placed on facilitating activation of deep cervical flexors and decreasing hyperactivation of SCm and upper trapezius. Patient will  benefit from continued skilled PT to address ROM and strength imbalances. Patient will benefit from functional strengthening with work simulation for safe lifting mechanics. She may also benefit from dry needling to improve muscle mobility; will discuss next visit.    Rehab Potential  Good    PT Frequency  2x / week    PT Duration  6 weeks    PT Treatment/Interventions  ADLs/Self Care Home Management;Cryotherapy;Moist Heat;Electrical Stimulation;Functional mobility training;Therapeutic activities;Therapeutic exercise;Balance  training;Neuromuscular re-education;Patient/family education;Manual techniques;Passive range of motion;Dry needling;Taping    PT Next Visit Plan  Next session heavy manual focus and postural training, thoracic and cervical excursions. Consider dry needling. Add 3D cervical/thoracic excursion next session.  Work on Architectural technologist for work, and STW for [employer] points in paraspinals, upper trap, and SCM.    PT Home Exercise Plan  Eval: upper trap stretch, levator stretch, SCM stretch, chin tucks     Consulted and Agree with Plan of Care  Patient       Patient will benefit from skilled therapeutic intervention in order to improve the following deficits and impairments:  Increased fascial restricitons, Pain, Improper body mechanics, Decreased coordination, Decreased mobility, Increased muscle spasms, Postural dysfunction, Decreased range of motion, Decreased strength, Hypomobility, Impaired flexibility  Visit Diagnosis: Cervicalgia  Abnormal posture  Stiffness of right shoulder, not elsewhere classified  Muscle weakness (generalized)     Problem List Patient Active Problem List   Diagnosis Date Noted  . Hypertension   . LEFT BUNDLE BRANCH BLOCK 12/16/2010  . MIGRAINE HEADACHE 12/03/2010  . CHEST PAIN 12/03/2010    Glyn Ade, PT, DPT Physical Therapist with [facility]  08/27/2017 10:54 AM    [facility] [address] [place], [place], [address] Phone: [phone]   Fax:  [phone]  Name: Summer Christensen MRN: [id number] Date of Birth: 07-26-1951

## 2017-08-27 NOTE — Patient Instructions (Addendum)
   Elastic Band Scapular Stability with Shoulder External Rotation: 2 sets of 10 repetitions  While holding an elastic band in each hand with both elbows at your side and with your elbows bent to 90 deg, slowly move your hands out to the side in a pain-free range against the resistance of the band.  The entire time keep your shoulder blades stable and tilted back against your back (do not let them tilt). They should not move.      UPPER TRAP STRETCH - HAND BEHIND BACK AND TOP OF HEAD:  2-3 times, hold for 30 seconds  Begin by retracting your head back into a chin tuck position. Next, place one hand behind your back and gently pull your head towards the opposite side with the help of your other arm.      Sternocleidomastoid (SCM) Stretch: 2-3 time for 30 seconds  In sitting, place one hand over the front of your collarbone, then extend your head backwards and to each side.

## 2017-09-01 ENCOUNTER — Ambulatory Visit (HOSPITAL_COMMUNITY): Payer: 59

## 2017-09-01 ENCOUNTER — Encounter (HOSPITAL_COMMUNITY): Payer: Self-pay

## 2017-09-01 DIAGNOSIS — M542 Cervicalgia: Secondary | ICD-10-CM | POA: Diagnosis not present

## 2017-09-01 DIAGNOSIS — M6281 Muscle weakness (generalized): Secondary | ICD-10-CM

## 2017-09-01 DIAGNOSIS — R293 Abnormal posture: Secondary | ICD-10-CM

## 2017-09-01 DIAGNOSIS — M25611 Stiffness of right shoulder, not elsewhere classified: Secondary | ICD-10-CM

## 2017-09-01 NOTE — Therapy (Signed)
[facility] [address] , [place], [address] Phone: [phone]   Fax:  [phone]  Physical Therapy Treatment  Patient Details  Name: Summer Christensen MRN: [id number] Date of Birth: 07/01/1951 Referring Provider: Lisbeth RenshawNeelesh Nundkumar    Encounter Date: 09/01/2017  PT End of Session - 09/01/17 0912    Visit Number  5    Number of Visits  13    Date for PT Re-Evaluation  09/01/17    Authorization Type  [employer] (26 visits, 0 used at eval)    Authorization Time Period  08/11/17 to 09/22/17    Authorization - Visit Number  5    Authorization - Number of Visits  26    PT Start Time  0904 first 4 min on UBE, no charge    PT Stop Time  0947    PT Time Calculation (min)  43 min    Activity Tolerance  Patient tolerated treatment well    Behavior During Therapy  [facility] for tasks assessed/performed       Past Medical History:  Diagnosis Date  . 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 Chest pain   . Hx of hysterectomy   . Hypertension   . Migraine     Past Surgical History:  Procedure Laterality Date  . ABDOMINAL HYSTERECTOMY    . LAPAROSCOPIC CHOLECYSTECTOMY  2005   pain.  Marland Kitchen. ROTATOR CUFF REPAIR  2009   arthroscopic  . TUBAL LIGATION     bilateral    There were no vitals filed for this visit.  Subjective Assessment - 09/01/17 0910    Subjective  Pt stated she completed her HEP while working last night.  STated she has some productive soreness, no real pain today.      Pertinent History  history of L rotator cuff repair 2009    Patient Stated Goals  avoid surgery     Currently in Pain?  No/denies                      [facility] - 09/01/17 0001      Neck Exercises: Machines for Strengthening   UBE (Upper Arm Bike)  4 mins on level 1, retrograde      Neck Exercises: Seated   Neck Retraction  10 reps verbal and tactile cueing    Neck Retraction Limitations  3" holds    Shoulder Rolls   10 reps up, back and down    Other Seated Exercise  3D cervical and thoracic excursion 5x each      Manual Therapy   Manual Therapy  Soft tissue mobilization    Manual therapy comments  Manual complete separate than rest of tx    Soft tissue mobilization  supine position STM to upper traps, position release technique for Rt upper trap      Neck Exercises: Stretches   Upper Trapezius Stretch  3 reps;30 seconds               PT Short Term Goals - 08/11/17 1150      PT SHORT TERM GOAL #1   Title  Patient to be able to maintain correct posture at least 70% of the time in order to assist in improving mechanics and reducing pain     Time  3    Period  Weeks    Status  New    Target Date  09/01/17      PT  SHORT TERM GOAL #2   Title  Patient to show that cervical ROM has improved by 15 degrees on all planes in order to assist in reducing pain     Time  3    Period  Weeks    Status  New      PT SHORT TERM GOAL #3   Title  Patient to show an improvement of at least 15 degrees in R shoulder flexion and abduction, as well as a 2 vertebral improvement in R shoulder ER/IR in order to assist in reducing pain     Time  3    Period  Weeks    Status  New      PT SHORT TERM GOAL #4   Title  Patient to be compliant with correct performance of HEP, to be updated as appropriate     Time  1    Period  Weeks    Status  New    Target Date  08/18/17        PT Long Term Goals - 08/11/17 1153      PT LONG TERM GOAL #1   Title  Patient to improve all tested groups by 1 MMT grade in order to assist in reducing pain with functional task performance    Time  6    Period  Weeks    Status  New    Target Date  09/22/17      PT LONG TERM GOAL #2   Title  Patient to demonstrate that cervical and R shoulder ROM are within 15 degrees of full in order to assist in reducing pain and improving mechanics     Time  6    Period  Weeks    Status  New      PT LONG TERM GOAL #3   Title  Patient  to experience pain as being no more than 2/10 in cervical spine in order to improve work tolerance and QOL     Time  6    Period  Weeks    Status  New      PT LONG TERM GOAL #4   Title  Patient to demonstrate at least a 50% reduction in muscle knotting and trigger points in order to reduce pain and compensation patterns     Time  6    Period  Weeks    Status  New            Plan - 09/01/17 1610    Clinical Impression Statement  Added 3D cervical and thoracic excursions to improve spinal mobility to address soreness.  Continued session focus on postural strengthening and stretches to improve cervical mobiltiy.  Pt continues to require moderate verbal and tactile cueing for proper form with therex.  EOS with manual to address cervical soft tissue restrictions.  Pt discussed this session with difficulties lifting objects, review proper lifting mechanics next session.      Rehab Potential  Good    Clinical Impairments Affecting Rehab Potential  (+) motivated; (-) chronicity of pain, significant guarding pattern     PT Frequency  2x / week    PT Duration  6 weeks    PT Treatment/Interventions  ADLs/Self Care Home Management;Cryotherapy;Moist Heat;Electrical Stimulation;Functional mobility training;Therapeutic activities;Therapeutic exercise;Balance training;Neuromuscular re-education;Patient/family education;Manual techniques;Passive range of motion;Dry needling;Taping    PT Next Visit Plan  Next session heavy manual focus and postural training, thoracic and cervical excursions. Consider dry needling. Work on Architectural technologist for work, and STW for  tigger points in paraspinals, upper trap, and SCM.    PT Home Exercise Plan  Eval: upper trap stretch, levator stretch, SCM stretch, chin tucks        Patient will benefit from skilled therapeutic intervention in order to improve the following deficits and impairments:  Increased fascial restricitons, Pain, Improper body mechanics, Decreased  coordination, Decreased mobility, Increased muscle spasms, Postural dysfunction, Decreased range of motion, Decreased strength, Hypomobility, Impaired flexibility  Visit Diagnosis: Cervicalgia  Abnormal posture  Stiffness of right shoulder, not elsewhere classified  Muscle weakness (generalized)     Problem List Patient Active Problem List   Diagnosis Date Noted  . Hypertension   . LEFT BUNDLE BRANCH BLOCK 12/16/2010  . MIGRAINE HEADACHE 12/03/2010  . CHEST PAIN 12/03/2010   Becky Sax, LPTA; CBIS [phone]  Juel Burrow 09/01/2017, 1:58 PM   [facility] [address] [place], [place], [address] Phone: [phone]   Fax:  [phone]  Name: Summer Christensen MRN: [id number] Date of Birth: [birth year]

## 2017-09-03 ENCOUNTER — Encounter (HOSPITAL_COMMUNITY): Payer: Self-pay | Admitting: Physical Therapy

## 2017-09-03 ENCOUNTER — Ambulatory Visit (HOSPITAL_COMMUNITY): Payer: 59 | Admitting: Physical Therapy

## 2017-09-03 ENCOUNTER — Other Ambulatory Visit: Payer: Self-pay

## 2017-09-03 DIAGNOSIS — R293 Abnormal posture: Secondary | ICD-10-CM

## 2017-09-03 DIAGNOSIS — M542 Cervicalgia: Secondary | ICD-10-CM

## 2017-09-03 DIAGNOSIS — M25611 Stiffness of right shoulder, not elsewhere classified: Secondary | ICD-10-CM

## 2017-09-03 DIAGNOSIS — M6281 Muscle weakness (generalized): Secondary | ICD-10-CM

## 2017-09-03 NOTE — Therapy (Signed)
[facility] [address] [place], [place], [address] Phone: [phone]   Fax:  [phone]  Physical Therapy Treatment  Patient Details  Name: Summer KindredLottie C Christensen MRN: [id number] Date of Birth: 07/17/1951 Referring Provider: Lisbeth RenshawNeelesh Nundkumar    Encounter Date: 09/03/2017  PT End of Session - 09/03/17 0946    Visit Number  6    Number of Visits  13    Date for PT Re-Evaluation  09/01/17    Authorization Type  [employer] (26 visits, 0 used at eval)    Authorization Time Period  08/11/17 to 09/22/17    Authorization - Visit Number  6    Authorization - Number of Visits  26    PT Start Time  0903    PT Stop Time  0945    PT Time Calculation (min)  42 min    Activity Tolerance  Patient tolerated treatment well    Behavior During Therapy  [facility] for tasks assessed/performed       Past Medical History:  Diagnosis Date  . 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 Chest pain   . Hx of hysterectomy   . Hypertension   . Migraine     Past Surgical History:  Procedure Laterality Date  . ABDOMINAL HYSTERECTOMY    . COLONOSCOPY N/A 06/11/2016   Procedure: COLONOSCOPY;  Surgeon: Malissa HippoNajeeb U Rehman, MD;  [facility];  Service: Endoscopy;  Laterality: N/A;  2:00 - moved to 1:00 - Ann notified pt  . LAPAROSCOPIC CHOLECYSTECTOMY  2005   pain.  Marland Kitchen. ROTATOR CUFF REPAIR  2009   arthroscopic  . TUBAL LIGATION     bilateral    There were no vitals filed for this visit.  Subjective Assessment - 09/03/17 0907    Subjective  Patient is having some pain today across her shoulder blades. 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 Stil reports doign HEP at work when time allows or while driving.    Pertinent History  history of L rotator cuff repair 2009    Currently in Pain?  Yes    Pain Score  8     Pain Location  Back    Pain Orientation  Posterior;Right;Left;Upper    Pain Descriptors / Indicators   Aching;Sharp;Sore    Pain Type  Chronic pain    Pain Onset  More than a month ago    Pain Frequency  Constant        [facility] Adult PT Treatment/Exercise - 09/03/17 0001      Therapeutic Activites    Therapeutic Activities  --      Neck Exercises: Machines for Strengthening   UBE (Upper Arm Bike)  4 mins on level 2, retrograde      Neck Exercises: Standing   Other Standing Exercises  Work simulation: repeated lifting for form, 3 repetitions 10 lbs; low to high surface       Neck Exercises: Seated   Neck Retraction  15 reps;Limitations    Neck Retraction Limitations  2 sets; shoulder ER with red theraband while sustaining cervical retraction    Shoulder Rolls  Backwards;15 reps      Neck Exercises: Prone   Other Prone Exercise  prone I's, Y's, T's; 1 x 10 reps each      Manual Therapy   Manual Therapy  Soft tissue mobilization;Passive ROM    Manual therapy comments  Manual complete separate than rest of  tx    Soft tissue mobilization  supine position STM to upper traps, position release technique for Rt upper trap    Passive ROM  upper trap stretch 2x 30 seconds bilaterally         PT Education - 09/03/17 0945    Education provided  Yes    Education Details  HEP review and improtance of setting aside specific time for it, educated on lifting technique for work    [employer]) Educated  Patient    Methods  Explanation;Demonstration    Comprehension  Verbalized understanding;Need further instruction;Returned demonstration       PT Short Term Goals - 08/11/17 1150      PT SHORT TERM GOAL #1   Title  Patient to be able to maintain correct posture at least 70% of the time in order to assist in improving mechanics and reducing pain     Time  3    Period  Weeks    Status  New    Target Date  09/01/17      PT SHORT TERM GOAL #2   Title  Patient to show that cervical ROM has improved by 15 degrees on all planes in order to assist in reducing pain     Time  3    Period  Weeks     Status  New      PT SHORT TERM GOAL #3   Title  Patient to show an improvement of at least 15 degrees in R shoulder flexion and abduction, as well as a 2 vertebral improvement in R shoulder ER/IR in order to assist in reducing pain     Time  3    Period  Weeks    Status  New      PT SHORT TERM GOAL #4   Title  Patient to be compliant with correct performance of HEP, to be updated as appropriate     Time  1    Period  Weeks    Status  New    Target Date  08/18/17        PT Long Term Goals - 08/11/17 1153      PT LONG TERM GOAL #1   Title  Patient to improve all tested groups by 1 MMT grade in order to assist in reducing pain with functional task performance    Time  6    Period  Weeks    Status  New    Target Date  09/22/17      PT LONG TERM GOAL #2   Title  Patient to demonstrate that cervical and R shoulder ROM are within 15 degrees of full in order to assist in reducing pain and improving mechanics     Time  6    Period  Weeks    Status  New      PT LONG TERM GOAL #3   Title  Patient to experience pain as being no more than 2/10 in cervical spine in order to improve work tolerance and QOL     Time  6    Period  Weeks    Status  New      PT LONG TERM GOAL #4   Title  Patient to demonstrate at least a 50% reduction in muscle knotting and trigger points in order to reduce pain and compensation patterns     Time  6    Period  Weeks    Status  New  Plan - 09/03/17 0947    Clinical Impression Statement  Patient is improving in therapy and requires fewer cues to acheive proper form for cervical retractions. She continues to be limited by work requirements for lifting and poor mechanics. Patient responds well to manual therapy to alleviate myofacsial restrictions and posturall exercises for sacpular control.  Patient had reduced pain at end of session from 8/10 to 4-5/10. She will continue to benefit fro skilled PT services to address current impairments,  reduce pain , improve strength and improve QOL.    Rehab Potential  Good    Clinical Impairments Affecting Rehab Potential  (+) motivated; (-) chronicity of pain, significant guarding pattern     PT Frequency  2x / week    PT Duration  6 weeks    PT Treatment/Interventions  ADLs/Self Care Home Management;Cryotherapy;Moist Heat;Electrical Stimulation;Functional mobility training;Therapeutic activities;Therapeutic exercise;Balance training;Neuromuscular re-education;Patient/family education;Manual techniques;Passive range of motion;Dry needling;Taping    PT Next Visit Plan  Next session heavy manual focus and postural training, thoracic and cervical excursions. Incorporate work simulation for lifting out of low bins and carrying box to higher surfaces. Consider dry needling. Work on Architectural technologist for work, and STW for [employer] points in paraspinals, upper trap, and SCM.    PT Home Exercise Plan  Eval: upper trap stretch, levator stretch, SCM stretch, chin tucks; reinforced importance of setting aside time for HEP specifically.    Consulted and Agree with Plan of Care  Patient       Patient will benefit from skilled therapeutic intervention in order to improve the following deficits and impairments:  Increased fascial restricitons, Pain, Improper body mechanics, Decreased coordination, Decreased mobility, Increased muscle spasms, Postural dysfunction, Decreased range of motion, Decreased strength, Hypomobility, Impaired flexibility  Visit Diagnosis: Cervicalgia  Abnormal posture  Stiffness of right shoulder, not elsewhere classified  Muscle weakness (generalized)     Problem List Patient Active Problem List   Diagnosis Date Noted  . Hypertension   . LEFT BUNDLE BRANCH BLOCK 12/16/2010  . MIGRAINE HEADACHE 12/03/2010  . CHEST PAIN 12/03/2010    Glyn Ade, PT, DPT Physical Therapist with [facility]  09/03/2017 9:58 AM    [facility] [address] [place], [place], [address] Phone: [phone]   Fax:  [phone]  Name: CIARAH ASTIN MRN: [id number] Date of Birth: Nov 12, 1950

## 2017-09-08 ENCOUNTER — Telehealth (HOSPITAL_COMMUNITY): Payer: Self-pay | Admitting: Physical Therapy

## 2017-09-08 ENCOUNTER — Ambulatory Visit (HOSPITAL_COMMUNITY): Payer: 59 | Admitting: Physical Therapy

## 2017-09-08 NOTE — Telephone Encounter (Signed)
No-show; attempted to call patient twice on home number however unable to reach patient. Tried work number however this appeared to be incorrect number for patient as it was not her name on voicemail.  Nedra Hai PT, DPT, CBIS  Supplemental Physical Therapist [facility]

## 2017-09-15 ENCOUNTER — Telehealth (HOSPITAL_COMMUNITY): Payer: Self-pay | Admitting: Physical Therapy

## 2017-09-15 ENCOUNTER — Ambulatory Visit (HOSPITAL_COMMUNITY): Payer: 59 | Admitting: Physical Therapy

## 2017-09-15 NOTE — Telephone Encounter (Signed)
No-show #2; called and left message detailing no-show as well as clinic policy of now dropping her to one appointment at a time. Detailed time/date of next scheduled appointment.   Nedra Hai PT, DPT, CBIS  Supplemental Physical Therapist [facility]

## 2017-09-17 ENCOUNTER — Other Ambulatory Visit: Payer: Self-pay

## 2017-09-17 ENCOUNTER — Ambulatory Visit (HOSPITAL_COMMUNITY): Payer: 59

## 2017-09-17 ENCOUNTER — Encounter (HOSPITAL_COMMUNITY): Payer: Self-pay

## 2017-09-17 DIAGNOSIS — M542 Cervicalgia: Secondary | ICD-10-CM | POA: Diagnosis not present

## 2017-09-17 DIAGNOSIS — M6281 Muscle weakness (generalized): Secondary | ICD-10-CM

## 2017-09-17 DIAGNOSIS — R293 Abnormal posture: Secondary | ICD-10-CM

## 2017-09-17 DIAGNOSIS — M25611 Stiffness of right shoulder, not elsewhere classified: Secondary | ICD-10-CM

## 2017-09-17 NOTE — Therapy (Signed)
[place], [place], [address] Phone: [phone]   Fax:  [phone]  Physical Therapy Treatment/Re-Assessment  Patient Details  Name: Summer Christensen MRN: [id number] Date of Birth: 11-23-1950 Referring Provider: Consuella Lose   Encounter Date: 09/17/2017  PT End of Session - 09/17/17 0945    Visit Number  7    Number of Visits  13    Date for PT Re-Evaluation  09/01/17    Authorization Type  [employer] NAP (26 visits, 0 used at eval)    Authorization Time Period  08/11/17 to 09/22/17    Authorization - Visit Number  7    Authorization - Number of Visits  26    PT Start Time  4196 patient arrived late    PT Stop Time  0945    PT Time Calculation (min)  25 min    Activity Tolerance  Patient tolerated treatment well    Behavior During Therapy  [facility] for tasks assessed/performed       Past Medical History:  Diagnosis Date  . 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 Chest pain   . Hx of hysterectomy   . Hypertension   . Migraine     Past Surgical History:  Procedure Laterality Date  . ABDOMINAL HYSTERECTOMY    . COLONOSCOPY N/A 06/11/2016   Procedure: COLONOSCOPY;  Surgeon: Rogene Houston, MD;  [facility];  Service: Endoscopy;  Laterality: N/A;  2:00 - moved to 1:00 - Ann notified pt  . LAPAROSCOPIC CHOLECYSTECTOMY  2005   pain.  Marland Kitchen ROTATOR CUFF REPAIR  2009   arthroscopic  . TUBAL LIGATION     bilateral    There were no vitals filed for this visit.  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 She does feel that she has improved in her strength and ability to move and is less irritable overall. She states when she has her pain it is sharp. She is hoping to request for time off after the holidays from work to see if that helps eliminate the last of her pain; she  is waiting on her FMLA paper from her MD to be able to do this. She states the paperwork is ready and she needs to pick it up today or tomorrow at her doctor's office. She also reports she is getting an injection after therapy however, she has not scheduled this appointment yet. She states she has not been keeping up with her exercises as well as she should.    Pertinent History  history of L rotator cuff repair 2009    Limitations  Lifting    How long can you sit comfortably?  no limit    How long can you stand comfortably?  no limit    How long can you walk comfortably?  no limit    Diagnostic tests  --    Patient Stated Goals  avoid surgery, decrease pain    Currently in Pain?  Yes    Pain Score  4  if moves neck, otherwise a 0    Pain Location  Neck    Pain Orientation  Right;Posterior;Lower    Pain Descriptors / Indicators  Sharp;Dull    Pain Radiating Towards  towrads the elbow, stops there    Pain Onset  More than  a month ago    Pain Frequency  Intermittent    Aggravating Factors   lifting at work    Pain Relieving Factors  doing nothing, just sitting    Effect of Pain on Daily Activities  moderate        [facility] PT Assessment - 09/17/17 0001      Assessment   Medical Diagnosis  cervical disc disorder     Referring Provider  Consuella Lose    Onset Date/Surgical Date  -- May 2018    Next MD Visit  unsure     Prior Therapy  none       Precautions   Precautions  None      Restrictions   Weight Bearing Restrictions  No      Balance Screen   Has the patient fallen in the past 6 months  No    Has the patient had a decrease in activity level because of a fear of falling?   No    Is the patient reluctant to leave their home because of a fear of falling?   No      Posture/Postural Control   Posture Comments  patient continues to have slouched posture when sitting and forward neck posture      AROM   Overall AROM Comments  AROM WFL's    Right Shoulder Flexion  180  Degrees    Right Shoulder ABduction  180 Degrees    Cervical Flexion  70    Cervical Extension  60    Cervical - Right Side Bend  45    Cervical - Left Side Bend  40    Cervical - Right Rotation  80    Cervical - Left Rotation  70      Strength   Overall Strength Comments  WFL grip     Right Shoulder Flexion  5/5    Right Shoulder ABduction  4+/5    Right Shoulder Internal Rotation  5/5    Right Shoulder External Rotation  4+/5    Left Shoulder Flexion  5/5    Left Shoulder ABduction  5/5    Left Shoulder Internal Rotation  5/5    Left Shoulder External Rotation  5/5    Right Elbow Flexion  5/5    Right Elbow Extension  4+/5    Left Elbow Flexion  5/5    Left Elbow Extension  5/5      Palpation   Palpation comment  patient has increased tissue tightness along R/L paraspinals however aptient denies pain or tenderness with palpation along paraspinals, no pain to palpation along upper trapezius, rhomboids or levetor scap        PT Education - 09/17/17 1151    Education provided  Yes    Education Details  Reviewed improtance of compliance with HEP, discussed significant progress patient has made with therapy with improvements of strength and ROM as well as decreased pain. Educated on possible discharge next session after updating and reviewing HEP.    Person(s) Educated  Patient    Methods  Explanation    Comprehension  Verbalized understanding       PT Short Term Goals - 09/17/17 0930      PT SHORT TERM GOAL #1   Title  Patient to be able to maintain correct posture at least 70% of the time in order to assist in improving mechanics and reducing pain     Baseline  11/29 - patient states she tries more than  she used to, she only uses bolster when driving for posture    Time  3    Period  Weeks    Status  On-going      PT SHORT TERM GOAL #2   Title  Patient to show that cervical ROM has improved by 15 degrees on all planes in order to assist in reducing pain     Baseline   11/29 - improved all ROM by at least 20*    Time  3    Period  Weeks    Status  Achieved      PT SHORT TERM GOAL #3   Title  Patient to show an improvement of at least 15 degrees in R shoulder flexion and abduction, as well as a 2 vertebral improvement in R shoulder ER/IR in order to assist in reducing pain     Baseline  11/29 - BUE shoudler ROM is equal and WFL    Time  3    Period  Weeks    Status  Achieved      PT SHORT TERM GOAL #4   Title  Patient to be compliant with correct performance of HEP, to be updated as appropriate     Baseline  11/29 - patient continues to participate in HEP at work or in the car, however has not set aside time to perform    Time  1    Period  Weeks    Status  On-going        PT Long Term Goals - 09/17/17 [place] #1   Title  Patient to improve all tested groups by 1 MMT grade in order to assist in reducing pain with functional task performance    Baseline  11/29 - all groups tested improved by at least 1 grade, all 4+/5 or better    Time  6    Period  Weeks    Status  Achieved      PT LONG TERM GOAL #2   Title  Patient to demonstrate that cervical and R shoulder ROM are within 15 degrees of full in order to assist in reducing pain and improving mechanics     Baseline  11/29 - all improved significantly    Time  6    Period  Weeks    Status  Achieved      PT LONG TERM GOAL #3   Title  Patient to experience pain as being no more than 2/10 in cervical spine in order to improve work tolerance and QOL     Baseline  11/29 - no pain when just sitting, standing, or walking, pain up to 4/10 when lifting or turning head to end range on right side    Time  6    Period  Weeks    Status  Partially Met      PT LONG TERM GOAL #4   Title  Patient to demonstrate at least a 50% reduction in muscle knotting and trigger points in order to reduce pain and compensation patterns     Baseline  11/29 - patient has no pain to palpation, muscle  remain tight on R/L paraspinals, no trigger points found in upper trap, rhomoids, or levetor scap    Time  6    Period  Weeks    Status  Achieved         Plan - 09/17/17 1153    Clinical Impression Statement  Patient has  made significant improvements with therapy and has met 2/4 STG's and 3/4 LTG's. Today's session was short due to late arrival by patient and she will benefit from one more sesion to review/update HEP before discharge. She is comfortable with this plan and feels if given further instruction on/review on exercises she will be able to manage independently to continue progressing.    Rehab Potential  Good    Clinical Impairments Affecting Rehab Potential  (+) motivated; (-) chronicity of pain, significant guarding pattern     PT Frequency  2x / week    PT Duration  6 weeks    PT Treatment/Interventions  ADLs/Self Care Home Management;Cryotherapy;Moist Heat;Electrical Stimulation;Functional mobility training;Therapeutic activities;Therapeutic exercise;Balance training;Neuromuscular re-education;Patient/family education;Manual techniques;Passive range of motion;Dry needling;Taping    PT Next Visit Plan  Next session update and reveiw HEP with plan to discharge patient. Review importance of compliance with HEP and setting aside specific time to perform ratehr than in the car or at work. Review safe lifting mechanics and provide handout for patient at work.    PT Home Exercise Plan  Eval: upper trap stretch, levator stretch, SCM stretch, chin tucks; reinforced importance of setting aside time for HEP specifically.    Consulted and Agree with Plan of Care  Patient       Patient will benefit from skilled therapeutic intervention in order to improve the following deficits and impairments:  Increased fascial restricitons, Pain, Improper body mechanics, Decreased coordination, Decreased mobility, Increased muscle spasms, Postural dysfunction, Decreased range of motion, Decreased strength,  Hypomobility, Impaired flexibility  Visit Diagnosis: Cervicalgia  Abnormal posture  Stiffness of right shoulder, not elsewhere classified  Muscle weakness (generalized)     Problem List Patient Active Problem List   Diagnosis Date Noted  . Hypertension   . LEFT BUNDLE BRANCH BLOCK 12/16/2010  . MIGRAINE HEADACHE 12/03/2010  . CHEST PAIN 12/03/2010    Debara Pickett, PT, DPT Physical Therapist with [facility]  09/17/2017 12:07 PM    [place], [place], [address] Phone: [phone]   Fax:  [phone]  Name: Summer Christensen MRN: [id number] Date of Birth: [birth year]

## 2017-09-22 ENCOUNTER — Encounter (HOSPITAL_COMMUNITY): Payer: 59 | Admitting: Physical Therapy

## 2017-09-22 ENCOUNTER — Ambulatory Visit (HOSPITAL_COMMUNITY): Payer: 59 | Attending: Neurosurgery

## 2017-09-22 ENCOUNTER — Encounter (HOSPITAL_COMMUNITY): Payer: Self-pay

## 2017-09-22 DIAGNOSIS — R293 Abnormal posture: Secondary | ICD-10-CM | POA: Insufficient documentation

## 2017-09-22 DIAGNOSIS — M6281 Muscle weakness (generalized): Secondary | ICD-10-CM | POA: Insufficient documentation

## 2017-09-22 DIAGNOSIS — M25611 Stiffness of right shoulder, not elsewhere classified: Secondary | ICD-10-CM | POA: Diagnosis present

## 2017-09-22 DIAGNOSIS — M542 Cervicalgia: Secondary | ICD-10-CM | POA: Diagnosis not present

## 2017-09-22 NOTE — Therapy (Signed)
[place], [place], [address] Phone: [phone]   Fax:  [phone]  September 22, 2017   @CCLISTADDRESS @  Physical Therapy Discharge Summary  Patient: Summer Christensen  MRN: [id number]  Date of Birth: 04-13-1951   Diagnosis: Cervicalgia  Abnormal posture  Stiffness of right shoulder, not elsewhere classified  Muscle weakness (generalized) Referring Provider: Consuella Lose   The above patient had been seen in Physical Therapy 8 times of 13 treatments scheduled with 3 no shows and 1 cancellations.  The treatment consisted of ADLs/Self Care Home Management;Cryotherapy;Moist Heat;Electrical Stimulation;Functional mobility training;Therapeutic activities;Therapeutic exercise;Balance training;Neuromuscular re-education;Patient/family education;Manual techniques;Passive range of motion;Dry needling;Taping  The patient is: Improved  Subjective: Patient has made good progress with STGs and LTGs per last re-assessment completed. She is to be discharged to HEP.   Discharge Findings: Good progress thus far, however, continued challenges with proper lifting techniques  Functional Status at Discharge: Continued impairment of lifting techniques   All Goals Met  Plan - 09/22/17 0934    Clinical Impression Statement  Patient was discharged at this visit upon recommendation after last session re-evaluation of STG and LTGs. Patient completed additional HEP exercises with good form and did not require further instructions. She continues to demonstrate poor lifting/pushing techniques that were corrected verbally and PT provided demonstration of lunging to pull carts to tractor and SL reaching inside container simulation. Overall, patient described verbally the difficulties in completing proper lifting techniques at work to minimize back ad neck stress, hwoever, agreed and verbalized understanding of benefits of proper lifting  techniques. 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     Rehab Potential  Good    Clinical Impairments Affecting Rehab Potential  (+) motivated; (-) chronicity of pain, significant guarding pattern     PT Frequency  2x / week    PT Duration  6 weeks    PT Treatment/Interventions  ADLs/Self Care Home Management;Cryotherapy;Moist Heat;Electrical Stimulation;Functional mobility training;Therapeutic activities;Therapeutic exercise;Balance training;Neuromuscular re-education;Patient/family education;Manual techniques;Passive range of motion;Dry needling;Taping    PT Next Visit Plan  Discharged     PT Home Exercise Plan  Eval: upper trap stretch, levator stretch, SCM stretch, chin tucks; reinforced importance of setting aside time for HEP specifically. 12/4: scaption, abduction, tband rows/retraction/extension     Consulted and Agree with Plan of Care  Patient       Sincerely,  Starr Lake PT, DPT 9:42 AM, 09/22/17 [phone]   CC @CCLISTRESTNAME @  [place], [place], [address] Phone: [phone]   Fax:  [phone]  Patient: Summer Christensen  MRN: [id number]  Date of Birth: 1951-03-26

## 2017-09-22 NOTE — Patient Instructions (Signed)
ELASTIC BAND ROWS Holding elastic band with both hands, draw back the band as you bend your elbows. Keep your elbows near the side of your body.  Repeat 10 Times Hold 5 Seconds Complete 1 Set Perform 1 Time(s) a Day  ELASTIC BAND ROWS - 90 ABD Holding an elastic band with both hands, draw back the band as you bend your elbows. Keep your about 90 degrees away from the side of your body.  Repeat 10 Times Hold 5 Seconds Complete 1 Set Perform 1 Time(s) a Day  ELASTIC BAND EXTENSION BILATERAL SHOULDER While holding an elastic band with both arms in front of you with your elbows straight, pull the band downwards and back towards your Side.  Repeat 10 Times Hold 5 Seconds Complete 1 Set Perform 1 Time(s) a Day  FREE WEIGHT - BILATERAL SCAPTION Hold a free weight (start with 1 pound) in both hands and then raise them both up away from your side in a forward/lateral direction. Your elbows should be straight and the movement should occur in the plane of the scapula or 45 degrees to the side as shown. Do not let your shoulder shrug upwards unless instructed to go over shoulder level Height.  Repeat 10 Times Hold 1 Second Complete 1 Set Perform 1 Time(s) a Day  FREE WEIGHT - BILATERAL ABDUCTION IN NEUTRAL - LATERAL RAISE While holding a dumbbell (start with 1 pound) in both hands and with your elbows straight, raise your arms up from your side with the palms facing downward. Lower and repeat. Do not let your shoulder shrug upwards and do not go over shoulder level height. Powered  Repeat 10 Times Hold 1 Second Complete 1 Set Perform 1 Time(s) a Day

## 2017-10-23 ENCOUNTER — Other Ambulatory Visit: Payer: Self-pay | Admitting: Neurosurgery

## 2017-10-23 DIAGNOSIS — M50122 Cervical disc disorder at C5-C6 level with radiculopathy: Secondary | ICD-10-CM

## 2017-11-09 ENCOUNTER — Ambulatory Visit
Admission: RE | Admit: 2017-11-09 | Discharge: 2017-11-09 | Disposition: A | Discharge status: Home / Self Care | Payer: 59 | Source: Ambulatory Visit | Attending: Neurosurgery | Admitting: Neurosurgery

## 2017-11-09 DIAGNOSIS — M50122 Cervical disc disorder at C5-C6 level with radiculopathy: Secondary | ICD-10-CM

## 2017-11-09 MED ORDER — IOPAMIDOL (ISOVUE-M 300) INJECTION 61%
1.0000 mL | Freq: Once | INTRAMUSCULAR | Status: AC | PRN
  Administered 2017-11-09: 1 mL via EPIDURAL

## 2017-11-09 MED ORDER — TRIAMCINOLONE ACETONIDE 40 MG/ML IJ SUSP (RADIOLOGY)
60.0000 mg | Freq: Once | INTRAMUSCULAR | Status: AC
  Administered 2017-11-09: 60 mg via EPIDURAL

## 2017-11-09 NOTE — Discharge Instructions (Signed)

## 2017-11-20 ENCOUNTER — Ambulatory Visit (INDEPENDENT_AMBULATORY_CARE_PROVIDER_SITE_OTHER): Payer: 59 | Admitting: Adult Health

## 2017-11-20 ENCOUNTER — Encounter: Payer: Self-pay | Admitting: Adult Health

## 2017-11-20 VITALS — BP 150/88 | HR 76 | Ht 63.5 in | Wt 144.0 lb

## 2017-11-20 DIAGNOSIS — Z1212 Encounter for screening for malignant neoplasm of rectum: Secondary | ICD-10-CM | POA: Diagnosis not present

## 2017-11-20 DIAGNOSIS — Z01411 Encounter for gynecological examination (general) (routine) with abnormal findings: Secondary | ICD-10-CM

## 2017-11-20 DIAGNOSIS — Z01419 Encounter for gynecological examination (general) (routine) without abnormal findings: Secondary | ICD-10-CM

## 2017-11-20 DIAGNOSIS — R8290 Unspecified abnormal findings in urine: Secondary | ICD-10-CM | POA: Diagnosis not present

## 2017-11-20 DIAGNOSIS — M542 Cervicalgia: Secondary | ICD-10-CM

## 2017-11-20 DIAGNOSIS — Z1211 Encounter for screening for malignant neoplasm of colon: Secondary | ICD-10-CM

## 2017-11-20 LAB — HEMOCCULT GUIAC POC 1CARD (OFFICE): FECAL OCCULT BLD: NEGATIVE

## 2017-11-20 LAB — POCT URINALYSIS DIPSTICK
Glucose, UA: NEGATIVE
Ketones, UA: NEGATIVE
LEUKOCYTES UA: NEGATIVE
Nitrite, UA: NEGATIVE
RBC UA: NEGATIVE

## 2017-11-20 NOTE — Progress Notes (Signed)
Patient ID: Summer Christensen, female   DOB: 30-Mar-1951, 67 y.o.   MRN: [id number] History of Present Illness: Summer Christensen is a 67 year old black female, divorced, in for well woman gyn exam, sp hysterectomy.She works at [employer]. PCP is D Kikel.    Current Medications, Allergies, Past Medical History, Past Surgical History, Family History and Social History were reviewed in [employer] record.     Review of Systems: Patient denies any headaches, hearing loss, fatigue, blurred vision, shortness of breath, chest pain, abdominal pain, problems with bowel movements, urination(urine smells), or intercourse(not active). No joint pain or mood swings. Pain in neck.   Physical Exam:BP (!) 150/88 (BP Location: Right Arm, Patient Position: Sitting, Cuff Size: Normal)   Pulse 76   Ht 5' 3.5" (1.613 m)   Wt 144 lb (65.3 kg)   BMI 25.11 kg/m urine trace protein General:  Well developed, well nourished, no acute distress Skin:  Warm and dry Neck:  Midline trachea, normal thyroid, good ROM, no lymphadenopathy,no carotid bruits heard. Lungs; Clear to auscultation bilaterally Breast:  No dominant palpable mass, retraction, or nipple discharge Cardiovascular: Regular rate and rhythm Abdomen:  Soft, non tender, no hepatosplenomegaly Pelvic:  External genitalia is normal in appearance, no lesions.  The vagina is normal in appearance. Urethra has no lesions or masses. The cervix and uterua are absent.  No adnexal masses or tenderness noted.Bladder is non tender, no masses felt. Rectal: Good sphincter tone, no polyps, or hemorrhoids felt.  Hemoccult negative. Extremities/musculoskeletal:  No swelling or varicosities noted, no clubbing or cyanosis Psych:  No mood changes, alert and cooperative,seems happy PHQ 9 score 10, denies being suicidal.She thinks it is more stress thinking about surgery and son in jail.She declines meds.  Impression: 1. Encounter for well woman exam with routine  gynecological exam   2. Abnormal urine odor   3. Screening for colorectal cancer       Plan: UA C&S Physical in 1 year Get mammogram now and yearly Labs at PCP Colonoscopy per GI

## 2017-11-21 LAB — MICROSCOPIC EXAMINATION
BACTERIA UA: NONE SEEN
EPITHELIAL CELLS (NON RENAL): NONE SEEN /HPF (ref 0–10)

## 2017-11-21 LAB — URINALYSIS, ROUTINE W REFLEX MICROSCOPIC
Bilirubin, UA: NEGATIVE
Glucose, UA: NEGATIVE
Ketones, UA: NEGATIVE
LEUKOCYTES UA: NEGATIVE
Nitrite, UA: NEGATIVE
PH UA: 5.5 (ref 5.0–7.5)
RBC, UA: NEGATIVE
Specific Gravity, UA: 1.012 (ref 1.005–1.030)
Urobilinogen, Ur: 0.2 mg/dL (ref 0.2–1.0)

## 2017-11-22 LAB — URINE CULTURE: Organism ID, Bacteria: NO GROWTH

## 2018-03-05 ENCOUNTER — Other Ambulatory Visit: Payer: Self-pay | Admitting: Neurosurgery

## 2018-03-05 DIAGNOSIS — M50122 Cervical disc disorder at C5-C6 level with radiculopathy: Secondary | ICD-10-CM

## 2018-03-11 ENCOUNTER — Inpatient Hospital Stay
Admission: RE | Admit: 2018-03-11 | Discharge: 2018-03-11 | Disposition: A | Discharge status: Home / Self Care | Payer: 59 | Source: Ambulatory Visit | Attending: Neurosurgery | Admitting: Neurosurgery

## 2018-03-24 ENCOUNTER — Inpatient Hospital Stay: Admission: RE | Admit: 2018-03-24 | Payer: 59 | Source: Ambulatory Visit

## 2018-04-07 ENCOUNTER — Ambulatory Visit
Admission: RE | Admit: 2018-04-07 | Discharge: 2018-04-07 | Disposition: A | Discharge status: Home / Self Care | Payer: 59 | Source: Ambulatory Visit | Attending: Neurosurgery | Admitting: Neurosurgery

## 2018-04-07 DIAGNOSIS — M50122 Cervical disc disorder at C5-C6 level with radiculopathy: Secondary | ICD-10-CM

## 2018-04-07 MED ORDER — TRIAMCINOLONE ACETONIDE 40 MG/ML IJ SUSP (RADIOLOGY)
60.0000 mg | Freq: Once | INTRAMUSCULAR | Status: AC
  Administered 2018-04-07: 60 mg via EPIDURAL

## 2018-04-07 MED ORDER — IOPAMIDOL (ISOVUE-M 300) INJECTION 61%
1.0000 mL | Freq: Once | INTRAMUSCULAR | Status: AC | PRN
  Administered 2018-04-07: 1 mL via EPIDURAL

## 2018-05-17 ENCOUNTER — Encounter (HOSPITAL_COMMUNITY): Payer: Self-pay

## 2018-05-17 ENCOUNTER — Emergency Department (HOSPITAL_COMMUNITY)
Admission: EM | Admit: 2018-05-17 | Discharge: 2018-05-18 | Disposition: A | Discharge status: Home / Self Care | Payer: 59 | Attending: Emergency Medicine | Admitting: Emergency Medicine

## 2018-05-17 DIAGNOSIS — I10 Essential (primary) hypertension: Secondary | ICD-10-CM | POA: Diagnosis not present

## 2018-05-17 DIAGNOSIS — Z79899 Other long term (current) drug therapy: Secondary | ICD-10-CM | POA: Diagnosis not present

## 2018-05-17 DIAGNOSIS — R42 Dizziness and giddiness: Secondary | ICD-10-CM | POA: Diagnosis present

## 2018-05-17 LAB — BASIC METABOLIC PANEL
Anion gap: 8 (ref 5–15)
BUN: 11 mg/dL (ref 8–23)
CHLORIDE: 107 mmol/L (ref 98–111)
CO2: 25 mmol/L (ref 22–32)
CREATININE: 0.75 mg/dL (ref 0.44–1.00)
Calcium: 9.2 mg/dL (ref 8.9–10.3)
Glucose, Bld: 112 mg/dL — ABNORMAL HIGH (ref 70–99)
POTASSIUM: 3.4 mmol/L — AB (ref 3.5–5.1)
SODIUM: 140 mmol/L (ref 135–145)

## 2018-05-17 LAB — CBC
HEMATOCRIT: 39.2 % (ref 36.0–46.0)
Hemoglobin: 13.4 g/dL (ref 12.0–15.0)
MCH: 29.3 pg (ref 26.0–34.0)
MCHC: 34.2 g/dL (ref 30.0–36.0)
MCV: 85.6 fL (ref 78.0–100.0)
PLATELETS: 245 10*3/uL (ref 150–400)
RBC: 4.58 MIL/uL (ref 3.87–5.11)
RDW: 13.4 % (ref 11.5–15.5)
WBC: 10.8 10*3/uL — AB (ref 4.0–10.5)

## 2018-05-17 MED ORDER — LISINOPRIL 10 MG PO TABS
20.0000 mg | ORAL_TABLET | Freq: Once | ORAL | Status: AC
  Administered 2018-05-17: 20 mg via ORAL
  Filled 2018-05-17: qty 2

## 2018-05-17 NOTE — ED Provider Notes (Signed)
[facility] EMERGENCY DEPARTMENT Provider Note   CSN: [id number] Arrival date & time: 05/17/18  2334     History   Chief Complaint Chief Complaint  Patient presents with  . Hypertension  . Dizziness    HPI Summer Christensen is a 67 y.o. female.  HPI Patient presents to the emergency room for evaluation of lightheadedness.  Patient states today she felt lightheaded and woozy.  She suspected her blood pressure was elevated.  Patient did not take her blood pressure medications today and she also ate barbecue which she typically does not eat.  Patient tried taking some vinegar but did not take her blood pressure medication.  Patient denies any trouble with her speech.  She felt like she was a little bit off balance but she was not falling to one side or the other.  She denies any vision changes.  No numbness or weakness.  No syncope.  No chest pain or shortness of breath.  No difficulty with her speech.  Patient is feeling a little bit better now than when she first came in.  She attributes this to the vinegar she took. Past Medical History:  Diagnosis Date  . 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 Chest pain   . Hx of hysterectomy   . Hypertension   . Migraine     Patient Active Problem List   Diagnosis Date Noted  . Hypertension   . LEFT BUNDLE BRANCH BLOCK 12/16/2010  . MIGRAINE HEADACHE 12/03/2010  . CHEST PAIN 12/03/2010    Past Surgical History:  Procedure Laterality Date  . ABDOMINAL HYSTERECTOMY    . COLONOSCOPY N/A 06/11/2016   Procedure: COLONOSCOPY;  Surgeon: Malissa Hippo, MD;  Location: [facility];  Service: Endoscopy;  Laterality: N/A;  2:00 - moved to 1:00 - Ann notified pt  . LAPAROSCOPIC CHOLECYSTECTOMY  2005   pain.  Marland Kitchen ROTATOR CUFF REPAIR  2009   arthroscopic  . TUBAL LIGATION     bilateral     OB History    Gravida  3   Para  3   Term  3   Preterm      AB      Living  3     SAB      TAB      Ectopic      Multiple      Live  Births  3            Home Medications    Prior to Admission medications   Medication Sig Start Date End Date Taking? Authorizing Provider  lisinopril (PRINIVIL,ZESTRIL) 20 MG tablet Take 20 mg by mouth daily.  08/12/17  Yes [provider]  Multiple Vitamins-Minerals (WOMENS MULTI VITAMIN & MINERAL) TABS Take 1 tablet by mouth once a week.    Yes [provider]    Family History Family History  Problem Relation Age of Onset  . Stroke Mother   . Heart attack Father     Social History Social History   Tobacco Use  . Smoking status: Never Smoker  . Smokeless tobacco: Never Used  Substance Use Topics  . Alcohol use: No  . Drug use: No     Allergies   Patient has no known allergies.   Review of Systems Review of Systems  All other systems reviewed and are negative.    Physical Exam Updated Vital Signs BP (!) 157/73   Pulse 66   Temp 98.1 F (36.7 C) (Oral)  Resp 13   Wt 67.6 kg (149 lb)   SpO2 97%   BMI 25.98 kg/m   Physical Exam  Constitutional: She is oriented to person, place, and time. She appears well-developed and well-nourished. No distress.  HENT:  Head: Normocephalic and atraumatic.  Right Ear: External ear normal.  Left Ear: External ear normal.  Mouth/Throat: Oropharynx is clear and moist.  Eyes: Conjunctivae are normal. Right eye exhibits no discharge. Left eye exhibits no discharge. No scleral icterus.  Neck: Neck supple. No tracheal deviation present.  Cardiovascular: Normal rate, regular rhythm and intact distal pulses.  Pulmonary/Chest: Effort normal and breath sounds normal. No stridor. No respiratory distress. She has no wheezes. She has no rales.  Abdominal: Soft. Bowel sounds are normal. She exhibits no distension. There is no tenderness. There is no rebound and no guarding.  Musculoskeletal: She exhibits no edema or tenderness.  Neurological: She is alert and oriented to person, place, and time. She has normal  strength. No cranial nerve deficit (No facial droop, extraocular movements intact, tongue midline ) or sensory deficit. She exhibits normal muscle tone. She displays no seizure activity. Coordination normal.  No pronator drift bilateral upper extrem, able to hold both legs off bed for 5 seconds, sensation intact in all extremities, no visual field cuts, no left or right sided neglect, normal finger-nose exam bilaterally, no nystagmus noted   Skin: Skin is warm and dry. No rash noted.  Psychiatric: She has a normal mood and affect.  Nursing note and vitals reviewed.    ED Treatments / Results  Labs (all labs ordered are listed, but only abnormal results are displayed) Labs Reviewed  CBC - Abnormal; Notable for the following components:      Result Value   WBC 10.8 (*)    All other components within normal limits  BASIC METABOLIC PANEL - Abnormal; Notable for the following components:   Potassium 3.4 (*)    Glucose, Bld 112 (*)    All other components within normal limits    EKG EKG Interpretation  Date/Time:  Monday May 17 2018 21:35:54 EDT Ventricular Rate:  67 PR Interval:    QRS Duration: 150 QT Interval:  465 QTC Calculation: 491 R Axis:   55 Text Interpretation:  Sinus rhythm Left bundle branch block Artifact LBBB noted on prior ECGs Confirmed by Linwood Dibbles [phone]) on 05/17/2018 9:40:55 PM   Radiology No results found.  Procedures Procedures (including critical care time)  Medications Ordered in ED Medications  lisinopril (PRINIVIL,ZESTRIL) tablet 20 mg (20 mg Oral Given 05/17/18 2137)     Initial Impression / Assessment and Plan / ED Course  I have reviewed the triage vital signs and the nursing notes.  Pertinent labs & imaging results that were available during my care of the patient were reviewed by me and considered in my medical decision making (see chart for details).  Clinical Course as of May 18 2331  Mon May 17, 2018  2331 Labs reviewed.  No  clinically significant abnormalities   [JK]    Clinical Course User Index [JK] Linwood Dibbles, MD   She presented to the emergency room with complaints of high blood pressure and dizziness.  She has no focal neurologic deficits.  She is not complaining of ataxia.  I doubt stroke.  Patient did have mild high blood pressure.  She had not taken her medications today.  Patient was given a dose of medications and I recommended outpatient follow-up with her primary care doctor.  Warning signs and precautions discussed.   Final Clinical Impressions(s) / ED Diagnoses   Final diagnoses:  Hypertension, unspecified type    ED Discharge Orders    None       Linwood Dibbles, MD 05/17/18 2332

## 2018-05-17 NOTE — ED Triage Notes (Signed)
Pt reports that she feels dizzy since she woke up at 3. Came in to have her BP checked because of how she felt. Mild nausea

## 2018-05-17 NOTE — Discharge Instructions (Addendum)
Follow up with your primary care doctor, continue your current medications, return to the emergency room for any worsening symptoms

## 2018-05-26 ENCOUNTER — Other Ambulatory Visit (HOSPITAL_COMMUNITY): Payer: Self-pay | Admitting: Emergency Medicine

## 2018-05-26 DIAGNOSIS — Z1231 Encounter for screening mammogram for malignant neoplasm of breast: Secondary | ICD-10-CM

## 2018-06-02 ENCOUNTER — Ambulatory Visit (HOSPITAL_COMMUNITY)
Admission: RE | Admit: 2018-06-02 | Discharge: 2018-06-02 | Disposition: A | Discharge status: Home / Self Care | Payer: 59 | Source: Ambulatory Visit | Attending: Emergency Medicine | Admitting: Emergency Medicine

## 2018-06-02 DIAGNOSIS — Z1231 Encounter for screening mammogram for malignant neoplasm of breast: Secondary | ICD-10-CM | POA: Insufficient documentation

## 2018-06-10 ENCOUNTER — Other Ambulatory Visit (HOSPITAL_COMMUNITY): Payer: Self-pay | Admitting: Emergency Medicine

## 2018-06-10 DIAGNOSIS — N6320 Unspecified lump in the left breast, unspecified quadrant: Secondary | ICD-10-CM

## 2018-06-22 ENCOUNTER — Ambulatory Visit (HOSPITAL_COMMUNITY)
Admission: RE | Admit: 2018-06-22 | Discharge: 2018-06-22 | Disposition: A | Discharge status: Home / Self Care | Payer: 59 | Source: Ambulatory Visit | Attending: Emergency Medicine | Admitting: Emergency Medicine

## 2018-06-22 ENCOUNTER — Encounter (HOSPITAL_COMMUNITY): Payer: Self-pay

## 2018-06-22 DIAGNOSIS — N6320 Unspecified lump in the left breast, unspecified quadrant: Secondary | ICD-10-CM | POA: Diagnosis present

## 2019-05-07 ENCOUNTER — Other Ambulatory Visit
Admission: RE | Admit: 2019-05-07 | Discharge: 2019-05-07 | Disposition: A | Discharge status: Home / Self Care | Payer: 59 | Source: Ambulatory Visit | Attending: Gastroenterology | Admitting: Gastroenterology

## 2019-05-07 DIAGNOSIS — R197 Diarrhea, unspecified: Secondary | ICD-10-CM | POA: Diagnosis not present

## 2019-05-07 DIAGNOSIS — R152 Fecal urgency: Secondary | ICD-10-CM | POA: Diagnosis not present

## 2019-05-07 LAB — GASTROINTESTINAL PANEL BY PCR, STOOL (REPLACES STOOL CULTURE)

## 2019-05-07 LAB — C DIFFICILE QUICK SCREEN W PCR REFLEX??
C Diff interpretation: NOT DETECTED
C Diff toxin: NEGATIVE

## 2019-05-07 LAB — C DIFFICILE QUICK SCREEN W PCR REFLEX: C Diff antigen: NEGATIVE

## 2019-05-10 LAB — CALPROTECTIN, FECAL: Calprotectin, Fecal: 53 ug/g (ref 0–120)

## 2019-05-13 LAB — PANCREATIC ELASTASE, FECAL: Pancreatic Elastase-1, Stool: >500 ug Elast./g (ref >200)

## 2019-06-05 ENCOUNTER — Encounter (HOSPITAL_COMMUNITY): Payer: Self-pay | Admitting: *Deleted

## 2019-06-05 ENCOUNTER — Other Ambulatory Visit: Payer: Self-pay

## 2019-06-05 ENCOUNTER — Emergency Department (HOSPITAL_COMMUNITY): Payer: 59

## 2019-06-05 ENCOUNTER — Emergency Department (HOSPITAL_COMMUNITY)
Admission: EM | Admit: 2019-06-05 | Discharge: 2019-06-05 | Disposition: A | Discharge status: Home / Self Care | Payer: 59 | Attending: Emergency Medicine | Admitting: Emergency Medicine

## 2019-06-05 DIAGNOSIS — I10 Essential (primary) hypertension: Secondary | ICD-10-CM | POA: Insufficient documentation

## 2019-06-05 DIAGNOSIS — R1010 Upper abdominal pain, unspecified: Secondary | ICD-10-CM | POA: Insufficient documentation

## 2019-06-05 DIAGNOSIS — X500XXA Overexertion from strenuous movement or load, initial encounter: Secondary | ICD-10-CM | POA: Diagnosis not present

## 2019-06-05 DIAGNOSIS — M545 Low back pain: Secondary | ICD-10-CM | POA: Diagnosis present

## 2019-06-05 DIAGNOSIS — M549 Dorsalgia, unspecified: Secondary | ICD-10-CM

## 2019-06-05 LAB — URINALYSIS, ROUTINE W REFLEX MICROSCOPIC
Bacteria, UA: NONE SEEN
Bilirubin Urine: NEGATIVE
Glucose, UA: NEGATIVE mg/dL
Hgb urine dipstick: NEGATIVE
Ketones, ur: NEGATIVE mg/dL
Nitrite: NEGATIVE
Protein, ur: NEGATIVE mg/dL
Specific Gravity, Urine: 1.012 (ref 1.005–1.030)
pH: 5 (ref 5.0–8.0)

## 2019-06-05 MED ORDER — NAPROXEN 500 MG PO TABS: 500.0000 mg | ORAL_TABLET | Freq: Two times a day (BID) | ORAL | 0 refills | Status: DC

## 2019-06-05 MED ORDER — KETOROLAC TROMETHAMINE 30 MG/ML IJ SOLN
30.0000 mg | Freq: Once | INTRAMUSCULAR | Status: AC
  Administered 2019-06-05: 03:00:00 30 mg via INTRAMUSCULAR
  Filled 2019-06-05: qty 1

## 2019-06-05 MED ORDER — METHOCARBAMOL 500 MG PO TABS: 500.0000 mg | ORAL_TABLET | Freq: Three times a day (TID) | ORAL | 0 refills | Status: DC | PRN

## 2019-06-05 NOTE — ED Provider Notes (Signed)
[facility] EMERGENCY DEPARTMENT Provider Note   CSN: [id number] Arrival date & time: 06/05/19  0222     History   Chief Complaint Chief Complaint  Patient presents with  . Back Pain    HPI Summer Christensen is a 68 y.o. female.     Patient presents with a one-week history of mid and low back pain of her back with some intermittent radiation of the left leg.  States this pain started after she was [place] several boxes [place] the post [employer].  She came in tonight because the pain is worse.  She is been taking anti-inflammatories at home that were prescribed by her neurosurgeon for her neck pain.  She was told she had multiple bulging disc in her neck and needed to have surgery on them.  She reports no neck pain at this time.  She denies any weakness in her arms or legs.  No numbness or tingling.  No bowel or bladder incontinence.  No fever, chills, nausea or vomiting.  No chest pain or shortness of breath.  No pain with urination or blood in the urine.  She describes pain in the center of her back that is worse on the left side.  There is no chest pain or upper back pain.  The history is provided by the patient.  Back Pain Associated symptoms: no abdominal pain, no chest pain, no dysuria, no fever, no headaches and no weakness     Past Medical History:  Diagnosis Date  . 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 Chest pain   . Hx of hysterectomy   . Hypertension   . Migraine     Patient Active Problem List   Diagnosis Date Noted  . Hypertension   . LEFT BUNDLE BRANCH BLOCK 12/16/2010  . MIGRAINE HEADACHE 12/03/2010  . CHEST PAIN 12/03/2010    Past Surgical History:  Procedure Laterality Date  . ABDOMINAL HYSTERECTOMY    . COLONOSCOPY N/A 06/11/2016   Procedure: COLONOSCOPY;  Surgeon: Malissa HippoNajeeb U Rehman, MD;  Location: [facility];  Service: Endoscopy;  Laterality: N/A;  2:00 - moved to 1:00 - Ann notified pt  . LAPAROSCOPIC CHOLECYSTECTOMY  2005   pain.  Marland Kitchen. ROTATOR CUFF REPAIR   2009   arthroscopic  . TUBAL LIGATION     bilateral     OB History    Gravida  3   Para  3   Term  3   Preterm      AB      Living  3     SAB      TAB      Ectopic      Multiple      Live Births  3            Home Medications    Prior to Admission medications   Medication Sig Start Date End Date Taking? Authorizing Provider  lisinopril (PRINIVIL,ZESTRIL) 20 MG tablet Take 20 mg by mouth daily.  08/12/17   [provider]  Multiple Vitamins-Minerals (WOMENS MULTI VITAMIN & MINERAL) TABS Take 1 tablet by mouth once a week.     [provider]    Family History Family History  Problem Relation Age of Onset  . Stroke Mother   . Heart attack Father     Social History Social History   Tobacco Use  . Smoking status: Never Smoker  . Smokeless tobacco: Never Used  Substance Use Topics  . Alcohol use: No  .  Drug use: No     Allergies   Patient has no known allergies.   Review of Systems Review of Systems  Constitutional: Negative for appetite change and fever.  HENT: Negative for congestion and rhinorrhea.   Eyes: Negative for visual disturbance.  Respiratory: Negative for cough, chest tightness and shortness of breath.   Cardiovascular: Negative for chest pain.  Gastrointestinal: Negative for abdominal pain, nausea and vomiting.  Genitourinary: Negative for dysuria, hematuria, vaginal bleeding and vaginal discharge.  Musculoskeletal: Positive for arthralgias, back pain, myalgias and neck pain.  Skin: Negative for rash.  Neurological: Negative for dizziness, syncope, speech difficulty, weakness and headaches.    all other systems are negative except as noted in the HPI and PMH.  Physical Exam Updated Vital Signs BP (!) 165/89   Pulse 84   Temp 97.8 F (36.6 C) (Oral)   Resp 16   Ht 5\' 4"  (1.626 m)   Wt 64.4 kg   SpO2 97%   BMI 24.37 kg/m   Physical Exam Vitals signs and nursing note reviewed.  Constitutional:       General: She is not in acute distress.    Appearance: She is well-developed.  HENT:     Head: Normocephalic and atraumatic.     Mouth/Throat:     Pharynx: No oropharyngeal exudate.  Eyes:     Conjunctiva/sclera: Conjunctivae normal.     Pupils: Pupils are equal, round, and reactive to light.  Neck:     Musculoskeletal: Normal range of motion and neck supple.     Comments: No meningismus. Cardiovascular:     Rate and Rhythm: Normal rate and regular rhythm.     Heart sounds: Normal heart sounds. No murmur.  Pulmonary:     Effort: Pulmonary effort is normal. No respiratory distress.     Breath sounds: Normal breath sounds.  Chest:     Chest wall: No tenderness.  Abdominal:     Palpations: Abdomen is soft.     Tenderness: There is no abdominal tenderness. There is no guarding or rebound.  Musculoskeletal: Normal range of motion.        General: Tenderness present.     Comments: Indicates mid thoracic and lumbar area is region of pain.  There is no step-off.  There is no palpable tenderness to palpation.  5/5 strength in bilateral lower extremities. Ankle plantar and dorsiflexion intact. Great toe extension intact bilaterally. +2 DP and PT pulses. +2 patellar reflexes bilaterally. Normal gait.   Skin:    General: Skin is warm.     Capillary Refill: Capillary refill takes less than 2 seconds.  Neurological:     General: No focal deficit present.     Mental Status: She is alert and oriented to person, place, and time. Mental status is at baseline.     Cranial Nerves: No cranial nerve deficit.     Motor: No abnormal muscle tone.     Coordination: Coordination normal.     Comments: No ataxia on finger to nose bilaterally. No pronator drift. 5/5 strength throughout. CN 2-12 intact.Equal grip strength. Sensation intact.   Psychiatric:        Behavior: Behavior normal.      ED Treatments / Results  Labs (all labs ordered are listed, but only abnormal results are displayed) Labs  Reviewed  URINALYSIS, ROUTINE W REFLEX MICROSCOPIC - Abnormal; Notable for the following components:      Result Value   Leukocytes,Ua TRACE (*)    All other components  within normal limits    EKG None  Radiology Ct L-spine No Charge  Result Date: 06/05/2019 CLINICAL DATA:  Mid back pain EXAM: CT LUMBAR SPINE WITHOUT CONTRAST TECHNIQUE: Multidetector CT imaging of the lumbar spine was performed without intravenous contrast administration. Multiplanar CT image reconstructions were also generated. COMPARISON:  None. FINDINGS: Segmentation: Transitional lumbosacral anatomy with partial lumbarization of S1 with a right-sided assimilation joint. Alignment: Normal Vertebrae: No acute fracture or focal pathologic process. Paraspinal and other soft tissues: Please see dedicated report for CT of the abdomen and pelvis. Disc levels: No visible large disc herniation. No bony spinal canal stenosis or bony foraminal encroachment. IMPRESSION: No acute abnormality of the lumbar spine. Electronically Signed   By: Deatra RobinsonKevin  Herman M.D.   On: 06/05/2019 05:24   Ct Renal Stone Study  Result Date: 06/05/2019 CLINICAL DATA:  Flank pain. Stone disease suspected. EXAM: CT ABDOMEN AND PELVIS WITHOUT CONTRAST TECHNIQUE: Multidetector CT imaging of the abdomen and pelvis was performed following the standard protocol without IV contrast. COMPARISON:  None. FINDINGS: Lower chest: No acute findings. Hepatobiliary: A 2.4 cm low-attenuation mass is seen in segment 7 of the right lobe. A tiny sub-cm low-attenuation lesion is seen in segment 4A on image 16/2. These cannot be characterized without IV contrast. Prior cholecystectomy. No evidence of biliary obstruction. Pancreas: No mass or inflammatory process visualized on this unenhanced exam. Spleen:  Within normal limits in size. Adrenals/Urinary tract: Moderate right renal parenchymal scarring. 2 mm calculus noted in lower pole of left kidney. No evidence of ureteral calculi or  hydronephrosis. Stomach/Bowel: No evidence of obstruction, inflammatory process, or abnormal fluid collections. Normal appendix visualized. Vascular/Lymphatic: No pathologically enlarged lymph nodes identified. No evidence of abdominal aortic aneurysm. Aortic atherosclerosis. Reproductive: Prior hysterectomy noted. Adnexal regions are unremarkable in appearance. Other:  None. Musculoskeletal:  No suspicious bone lesions identified. IMPRESSION: 1. Tiny left renal calculus. No evidence of ureteral calculi, hydronephrosis, or other acute findings. 2. Moderate right renal parenchymal scarring. 3. 2.4 cm low-attenuation mass in right lobe of liver, and tiny sub-cm lesion in segment 4A of the liver, which cannot be characterized without IV contrast. Nonemergent abdomen MRI without and with contrast recommended for further characterization. Aortic Atherosclerosis (ICD10-I70.0). Electronically Signed   By: Danae OrleansJohn A Stahl M.D.   On: 06/05/2019 04:57    Procedures Procedures (including critical care time)  Medications Ordered in ED Medications  ketorolac (TORADOL) 30 MG/ML injection 30 mg (has no administration in time range)     Initial Impression / Assessment and Plan / ED Course  I have reviewed the triage vital signs and the nursing notes.  Pertinent labs & imaging results that were available during my care of the patient were reviewed by me and considered in my medical decision making (see chart for details).       Mid thoracic and lumbar pain for the past week after lifting injury.  Neurovascularly intact with intact pulses, strength, sensation and reflexes.  Equal upper extremity blood pressures bilaterally.  Equal radial pulses and femoral and DP pulses. Low suspicion for aortic dissection.   Low suspicion for cord compression or cauda equina.  Intact strength and sensation.  No incontinence.  No fever or vomiting. No chest pain or upper back pain or abdominal pain.  UA negative.  Imaging  shows no AAA.  Lumbar spine appears normal on CT scan. Patient made aware of incidental liver findings and need for MRI follow-up.  Recheck she is feeling much improved and anxious  to go home.  She is able to ambulate and is tolerating p.o.  Results discussed with her and need for further evaluation regarding her liver abnormality.  She will be treated with anti-inflammatories and muscle relaxers.  She will given a note to limit limit lifting at work. Follow-up with her primary doctor.  Return to the ED with new or worsening symptoms. Final Clinical Impressions(s) / ED Diagnoses   Final diagnoses:  Back pain    ED Discharge Orders    None       Helmuth Recupero, Annie Main, MD 06/05/19 [phone]

## 2019-06-05 NOTE — Discharge Instructions (Signed)
Take the anti-inflammatories and muscle relaxers as prescribed.  Do not drive or operate heavy machinery while taking the muscle relaxers.  You should not lift more than 10 pounds until your back is feeling better.  Your CT scan showed an abnormality of your liver which needs further follow-up with an MRI.  Return to the ED with worsening pain, weakness, numbness, bowel or bladder incontinence, fever, vomiting or other concerns.

## 2019-06-05 NOTE — ED Triage Notes (Signed)
Pt c/o back pain that starts at the middle of the back and radiates down, pain is worse with movement, denies any injury,

## 2019-09-13 ENCOUNTER — Encounter (HOSPITAL_BASED_OUTPATIENT_CLINIC_OR_DEPARTMENT_OTHER): Payer: Self-pay | Admitting: *Deleted

## 2019-09-13 ENCOUNTER — Emergency Department (HOSPITAL_BASED_OUTPATIENT_CLINIC_OR_DEPARTMENT_OTHER)
Admission: EM | Admit: 2019-09-13 | Discharge: 2019-09-13 | Disposition: A | Discharge status: Home / Self Care | Attending: Emergency Medicine | Admitting: Emergency Medicine

## 2019-09-13 ENCOUNTER — Other Ambulatory Visit: Payer: Self-pay

## 2019-09-13 DIAGNOSIS — M62830 Muscle spasm of back: Secondary | ICD-10-CM | POA: Diagnosis not present

## 2019-09-13 DIAGNOSIS — I10 Essential (primary) hypertension: Secondary | ICD-10-CM | POA: Insufficient documentation

## 2019-09-13 DIAGNOSIS — Z79899 Other long term (current) drug therapy: Secondary | ICD-10-CM | POA: Diagnosis not present

## 2019-09-13 DIAGNOSIS — M542 Cervicalgia: Secondary | ICD-10-CM | POA: Diagnosis present

## 2019-09-13 DIAGNOSIS — M62838 Other muscle spasm: Secondary | ICD-10-CM | POA: Insufficient documentation

## 2019-09-13 MED ORDER — CYCLOBENZAPRINE HCL 10 MG PO TABS: 10.0000 mg | ORAL_TABLET | Freq: Two times a day (BID) | ORAL | 0 refills | Status: DC | PRN

## 2019-09-13 MED ORDER — LIDOCAINE 5 % EX PTCH: 1.0000 | MEDICATED_PATCH | CUTANEOUS | 0 refills | Status: DC

## 2019-09-13 NOTE — ED Provider Notes (Signed)
[facility] EMERGENCY DEPARTMENT Provider Note   CSN: [id number] Arrival date & time: 09/13/19  2037     History   Chief Complaint Chief Complaint  Patient presents with   Neck Injury    HPI HIBAQ NAKAO is a 68 y.o. female.     The history is provided by the patient and medical records. No language interpreter was used.  Back Pain Location:  Thoracic spine Quality:  Cramping Radiates to:  Does not radiate Pain severity:  Moderate Pain is:  Same all the time Onset quality:  Sudden Duration:  2 days Timing:  Constant Progression:  Unchanged Chronicity:  Recurrent Context: occupational injury (lifting something at work with neck an dback began to hurt on left) and twisting   Relieved by:  Nothing Worsened by:  Twisting and bending Ineffective treatments:  None tried Associated symptoms: no abdominal pain, no bladder incontinence, no bowel incontinence, no chest pain, no dysuria, no fever, no headaches, no leg pain, no numbness, no paresthesias, no perianal numbness, no tingling, no weakness and no weight loss   Neck Injury This is a new problem. The current episode started 2 days ago. The problem occurs constantly. The problem has not changed since onset.Pertinent negatives include no chest pain, no abdominal pain, no headaches and no shortness of breath. The symptoms are aggravated by bending. Nothing relieves the symptoms. She has tried nothing for the symptoms.    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   ROTATOR CUFF REPAIR  2009   arthroscopic   TUBAL LIGATION     bilateral     OB History    Gravida  3   Para  3   Term  3   Preterm      AB      Living  3     SAB      TAB      Ectopic      Multiple      Live Births  3            Home Medications    Prior to Admission medications   Medication Sig Start Date End Date Taking? Authorizing Provider  lisinopril (PRINIVIL,ZESTRIL) 20 MG tablet Take 20 mg by mouth daily.  08/12/17   [provider]  methocarbamol (ROBAXIN) 500 MG tablet Take 1 tablet (500 mg total) by mouth every 8 (eight) hours as needed for muscle spasms. 06/05/19   Rancour, Jeannett Senior, MD  Multiple Vitamins-Minerals (WOMENS MULTI VITAMIN & MINERAL) TABS Take 1 tablet by mouth once a week.     [provider]  naproxen (NAPROSYN) 500 MG tablet Take 1 tablet (500 mg total) by mouth 2 (two) times daily with a meal. 06/05/19   Glynn Octave, MD    Family History  Family History  Problem Relation Age of Onset   Stroke Mother    Heart attack Father     Social History Social History   Tobacco Use   Smoking status: Never Smoker   Smokeless tobacco: Never Used  Substance Use Topics   Alcohol use: No   Drug use: No     Allergies   Patient has no known allergies.   Review of Systems Review of Systems  Constitutional: Negative for chills, diaphoresis, fatigue, fever and weight loss.  HENT: Negative for congestion.   Eyes: Negative for visual disturbance.  Respiratory: Negative for cough, chest tightness, shortness of breath and wheezing.   Cardiovascular: Negative for chest pain, palpitations and leg swelling.  Gastrointestinal: Negative for abdominal pain, bowel incontinence, constipation, diarrhea, nausea and vomiting.  Genitourinary: Negative for bladder  incontinence and dysuria.  Musculoskeletal: Positive for back pain and neck pain. Negative for neck stiffness.  Neurological: Negative for dizziness, tingling, seizures, facial asymmetry, speech difficulty, weakness, light-headedness, numbness, headaches and paresthesias.  Psychiatric/Behavioral: Negative for agitation.  All other systems reviewed and are negative.    Physical Exam Updated Vital Signs BP (!) 150/108    Pulse 84    Temp 98.5 F (36.9 C)    Resp 16    Ht 5\' 4"  (1.626 m)    Wt 62.1 kg    SpO2 98%    BMI 23.52 kg/m   Physical Exam Vitals signs and nursing note reviewed.  Constitutional:      General: She is not in acute distress.    Appearance: She is well-developed. She is not ill-appearing, toxic-appearing or diaphoretic.  HENT:     Head: Normocephalic and atraumatic.     Right Ear: External ear normal.     Left Ear: External ear normal.     Nose: Nose normal. No congestion or rhinorrhea.     Mouth/Throat:     Mouth: Mucous membranes are moist.  Eyes:     Extraocular Movements: Extraocular movements intact.     Conjunctiva/sclera: Conjunctivae normal.     Pupils: Pupils are equal, round, and reactive to light.  Neck:     Musculoskeletal: Normal range of motion and neck supple. Muscular tenderness present. No neck rigidity.   Cardiovascular:     Rate and Rhythm: Normal rate.     Heart sounds: No murmur.  Pulmonary:     Effort: No respiratory distress.     Breath sounds: No stridor.  Abdominal:     General: There is no distension.     Tenderness: There is no abdominal tenderness. There is no right CVA tenderness, left CVA tenderness or rebound.  Musculoskeletal:        General: Tenderness present.     Cervical back: She exhibits tenderness, pain and spasm. She exhibits no bony tenderness.       Back:     Right lower leg: No edema.     Left lower leg: No edema.     Comments: Patient has left-sided paraspinal tenderness in the neck and upper/mid back.   Muscle spasm palpated.  No midline tenderness or red flags.  No neurologic deficits on exam.  No laceration seen.  Skin:    General: Skin is warm.     Findings: No erythema or rash.  Neurological:     General: No focal deficit present.     Mental Status: She is alert and oriented to person, place, and time.     Cranial Nerves: No cranial nerve deficit.  Sensory: No sensory deficit.     Motor: No weakness or abnormal muscle tone.     Coordination: Coordination normal.     Gait: Gait normal.     Deep Tendon Reflexes: Reflexes are normal and symmetric.     Comments: No focal neurologic deficits.  Normal grip strength, sensation, coordination, gait, pulses in all extremities.  Psychiatric:        Mood and Affect: Mood normal.      ED Treatments / Results  Labs (all labs ordered are listed, but only abnormal results are displayed) Labs Reviewed - No data to display  EKG None  Radiology No results found.  Procedures Procedures (including critical care time)  Medications Ordered in ED Medications - No data to display   Initial Impression / Assessment and Plan / ED Course  I have reviewed the triage vital signs and the nursing notes.  Pertinent labs & imaging results that were available during my care of the patient were reviewed by me and considered in my medical decision making (see chart for details).        Summer Christensen is a 68 y.o. female with a past medical history significant for hypertension, migraines, prior hysterectomy, and documentation showing prior muscle spasms who presents with left-sided neck and back pain after lifting something heavy at work.  Patient reports that 2 days ago, she was lifting something and started having pain in her left neck and left upper back.  She reports she has had muscle spasms in the past but this feels worse.  She reports that the pain has been worsened when she is try to work or twist or move.  She denies any midline pain.  She  denies any red flags such as numbness, tingling, weakness of extremities.  She denies any loss of bowel or bladder control.  She denies difficulty with ambulation.  She reports the pain is moderate to severe and did not help with over-the-counter medications.  She reports the pain is in the left lateral neck and left back.  She denies any chest pain or palpitations.  No other injuries.  No history of spinal surgeries or fractures.  On exam, patient has muscular tenderness, and palpated spasms in the left neck and left upper back.  Patient had no midline tenderness.  Normal neck range of motion.  No evidence of neurologic deficits with normal grip strength, sensation, and coordination in extremities.  Normal gait.  Abdomen nontender.  Chest nontender.  Lungs clear and no murmur.  Patient resting comfortably.  Had a shared decision-making conversation with patient and we agreed to hold on any imaging at this time.  Given her lack of trauma, lower suspicion for fracture dislocation.  She has no midline tenderness and her pain is primarily in the muscle spasming in the paraspinal muscles.  We agreed to try muscle relaxant and Lidoderm patch to see if this will help as well as over-the-counter medications.  If symptoms do not improve, would like to be appropriate to get some imaging such as x-ray or CT to further evaluate on a repeat visit.  Patient is encouraged to follow-up with sports medicine team which she agrees with.  Low suspicion for a cord injury or injury requiring transfer or advanced imaging tonight.  Patient agreed with plan of care and understands return precautions.  Patient discharged in good condition.    Final Clinical Impressions(s) / ED Diagnoses   Final diagnoses:  Muscle spasm of back  Muscle spasms  of neck  Neck pain on left side    ED Discharge Orders    None     Clinical Impression: 1. Muscle spasm of back   2. Muscle spasms of neck   3. Neck pain on left side      Disposition: Discharge  Condition: Good  I have discussed the results, Dx and Tx plan with the pt(& family if present). He/she/they expressed understanding and agree(s) with the plan. Discharge instructions discussed at great length. Strict return precautions discussed and pt &/or family have verbalized understanding of the instructions. No further questions at time of discharge.    Discharge Medication List as of 09/13/2019 11:07 PM    START taking these medications   Details  cyclobenzaprine (FLEXERIL) 10 MG tablet Take 1 tablet (10 mg total) by mouth 2 (two) times daily as needed for muscle spasms., Starting Tue 09/13/2019, Print    lidocaine (LIDODERM) 5 % Place 1 patch onto the skin daily. Remove & Discard patch within 12 hours or as directed by MD, Starting Tue 09/13/2019, Print        Follow Up: Smith RobertKikel, Stephen, MD [address] [place] [address] [phone]     Myra RudeSchmitz, Jeremy E, MD [address] [place] [id number] [phone]        Jaidin Ugarte, Canary Brimhristopher J, MD 09/14/19 [phone]

## 2019-09-13 NOTE — ED Triage Notes (Signed)
Pt c/o left neck injury at work x 3 days ago after lifting heavy objects

## 2019-09-13 NOTE — Discharge Instructions (Signed)
Your history and exam today are consistent with muscle cramps and spasms of the neck and back. Please use the muscle relaxant and numbing patch to help with the pain. Please rest and stay hydrated. Please follow up with sports medicine. If any symptoms change or worsen, please return to the nearest emergency department.

## 2020-02-05 ENCOUNTER — Encounter (HOSPITAL_COMMUNITY): Payer: Self-pay | Admitting: Emergency Medicine

## 2020-02-05 ENCOUNTER — Other Ambulatory Visit: Payer: Self-pay

## 2020-02-05 ENCOUNTER — Emergency Department (HOSPITAL_COMMUNITY)
Admission: EM | Admit: 2020-02-05 | Discharge: 2020-02-05 | Disposition: A | Discharge status: Home / Self Care | Payer: 59 | Attending: Emergency Medicine | Admitting: Emergency Medicine

## 2020-02-05 DIAGNOSIS — Z79899 Other long term (current) drug therapy: Secondary | ICD-10-CM | POA: Diagnosis not present

## 2020-02-05 DIAGNOSIS — I10 Essential (primary) hypertension: Secondary | ICD-10-CM | POA: Diagnosis not present

## 2020-02-05 DIAGNOSIS — M62830 Muscle spasm of back: Secondary | ICD-10-CM | POA: Insufficient documentation

## 2020-02-05 DIAGNOSIS — M545 Low back pain: Secondary | ICD-10-CM | POA: Diagnosis present

## 2020-02-05 MED ORDER — METHOCARBAMOL 750 MG PO TABS: 750.0000 mg | ORAL_TABLET | Freq: Every evening | ORAL | 0 refills | Status: AC | PRN

## 2020-02-05 MED ORDER — LIDOCAINE 5 % EX PTCH: 1.0000 | MEDICATED_PATCH | CUTANEOUS | 0 refills | Status: DC

## 2020-02-05 MED ORDER — KETOROLAC TROMETHAMINE 30 MG/ML IJ SOLN
30.0000 mg | Freq: Once | INTRAMUSCULAR | Status: AC
  Administered 2020-02-05: 30 mg via INTRAMUSCULAR
  Filled 2020-02-05: qty 1

## 2020-02-05 MED ORDER — NAPROXEN 375 MG PO TABS: 375.0000 mg | ORAL_TABLET | Freq: Two times a day (BID) | ORAL | 0 refills | Status: DC

## 2020-02-05 NOTE — ED Triage Notes (Signed)
Works at [employer]  Hasn't worked in 2 weeks due to back pain  Injured earlier in the year   Doctor in [place] 3 weeks ago recommended a specialist  Now here for eval of low back pain   And work excuse  Pt ambulates heel to toe with erect posture

## 2020-02-05 NOTE — ED Provider Notes (Signed)
[facility] EMERGENCY DEPARTMENT Provider Note   CSN: [id number] Arrival date & time: 02/05/20  1451     History Chief Complaint  Patient presents with  . Back Pain    OTJ injury     Summer Christensen is a 69 y.o. female.  HPI   Pt is a 69 y/o female with a h/o HTN, migraines who presents to the ED today c/o back pain that started in January 2021. 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 Pain located to the lower back that is constant in nature. Rates pain 10/10. Pain worse recently and for the last 2 weeks she states she has not been able to work. She was placed on a muscle relaxer. She states she took bayer last night and was able to sleep. She was told she needed to f/u with a specialist.   Pt denies any numbness/tingling/weakness to the BLE. Denies saddle anesthesia. Denies loss of control of bowels or bladder. No urinary retention. No fevers. Denies a h/o IVDU. Denies a h/o CA.   Past Medical History:  Diagnosis Date  . 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 Chest pain   . Hx of hysterectomy   . Hypertension   . Migraine     Patient Active Problem List   Diagnosis Date Noted  . Hypertension   . LEFT BUNDLE BRANCH BLOCK 12/16/2010  . MIGRAINE HEADACHE 12/03/2010  . CHEST PAIN 12/03/2010    Past Surgical History:  Procedure Laterality Date  . ABDOMINAL HYSTERECTOMY    . COLONOSCOPY N/A 06/11/2016   Procedure: COLONOSCOPY;  Surgeon: Malissa Hippo, MD;  Location: [facility];  Service: Endoscopy;  Laterality: N/A;  2:00 - moved to 1:00 - Ann notified pt  . LAPAROSCOPIC CHOLECYSTECTOMY  2005   pain.  Marland Kitchen ROTATOR CUFF REPAIR  2009   arthroscopic  . TUBAL LIGATION     bilateral     OB History    Gravida  3   Para  3   Term  3   Preterm      AB      Living  3     SAB      TAB      Ectopic      Multiple      Live Births  3           Family History  Problem Relation Age of Onset  . Stroke Mother   . Heart attack Father     Social  History   Tobacco Use  . Smoking status: Never Smoker  . Smokeless tobacco: Never Used  Substance Use Topics  . Alcohol use: No  . Drug use: No    Home Medications Prior to Admission medications   Medication Sig Start Date End Date Taking? Authorizing Provider  cyclobenzaprine (FLEXERIL) 10 MG tablet Take 1 tablet (10 mg total) by mouth 2 (two) times daily as needed for muscle spasms. 09/13/19   Tegeler, Canary Brim, MD  lidocaine (LIDODERM) 5 % Place 1 patch onto the skin daily. Remove & Discard patch within 12 hours or as directed by MD 02/05/20   Sai Zinn S, PA-C  lisinopril (PRINIVIL,ZESTRIL) 20 MG tablet Take 20 mg by mouth daily.  08/12/17   [provider]  methocarbamol (ROBAXIN) 750 MG tablet Take 1 tablet (750 mg total) by mouth at bedtime as needed for up to 5 days for muscle spasms. 02/05/20 02/10/20  Lawerance Matsuo S, PA-C  Multiple  Vitamins-Minerals (WOMENS MULTI VITAMIN & MINERAL) TABS Take 1 tablet by mouth once a week.     [provider]  naproxen (NAPROSYN) 375 MG tablet Take 1 tablet (375 mg total) by mouth 2 (two) times daily. 02/05/20   Esmirna Ravan S, PA-C    Allergies    Patient has no known allergies.  Review of Systems   Review of Systems  Constitutional: Negative for fever.  Respiratory: Negative for shortness of breath.   Cardiovascular: Negative for chest pain.  Gastrointestinal: Negative for abdominal pain, blood in stool, constipation, diarrhea, nausea and vomiting.  Genitourinary: Negative for dysuria, flank pain, frequency, hematuria and urgency.  Musculoskeletal: Positive for back pain. Negative for gait problem.  Skin: Negative for wound.  Neurological: Negative for weakness and numbness.    Physical Exam Updated Vital Signs BP (!) 170/60 (BP Location: Right Arm)   Pulse 84   Temp 98.9 F (37.2 C) (Temporal)   Resp 18   Ht 5\' 4"  (1.626 m)   Wt 66.7 kg   SpO2 99%   BMI 25.23 kg/m   Physical Exam Vitals and  nursing note reviewed.  Constitutional:      General: She is not in acute distress.    Appearance: She is well-developed.  HENT:     Head: Normocephalic and atraumatic.  Eyes:     Conjunctiva/sclera: Conjunctivae normal.  Cardiovascular:     Rate and Rhythm: Normal rate and regular rhythm.     Heart sounds: No murmur.  Pulmonary:     Effort: Pulmonary effort is normal. No respiratory distress.     Breath sounds: Normal breath sounds.  Abdominal:     Palpations: Abdomen is soft.     Tenderness: There is no abdominal tenderness.  Musculoskeletal:     Cervical back: Neck supple.     Comments: No focal ttp to the lumbar spine. No ttp to the paraspinous muscles. Mild ttp just above the left sciatic notch. 5/5 strength to the BLE. Normal sensation throughout. Ambulatory with steady gait. No distress with ambulation and no limp observed  Skin:    General: Skin is warm and dry.  Neurological:     Mental Status: She is alert.     ED Results / Procedures / Treatments   Labs (all labs ordered are listed, but only abnormal results are displayed) Labs Reviewed - No data to display  EKG None  Radiology No results found.  Procedures Procedures (including critical care time)  Medications Ordered in ED Medications  ketorolac (TORADOL) 30 MG/ML injection 30 mg (30 mg Intramuscular Given 02/05/20 1601)    ED Course  I have reviewed the triage vital signs and the nursing notes.  Pertinent labs & imaging results that were available during my care of the patient were reviewed by me and considered in my medical decision making (see chart for details).    MDM Rules/Calculators/A&P                      Pt is a 69 y/o female with a h/o HTN, migraines who presents to the ED today c/o back pain that started in January 2021. 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  Patient with back pain.  No neurological deficits and normal neuro exam.  Patient can walk but states is painful.  No  loss of bowel or bladder control.  No concern for cauda equina.  No fever, night sweats, weight loss, h/o cancer, IVDU.  RICE  protocol and pain medicine indicated and discussed with patient.   Reviewed patient's chart.  She has been seen for back pain multiple times and most recently was seen in 05/2019.  She had CT renal stone and CT of the lumbar at that time which did not show any acute findings.  I do not feel that repeat imaging today is necessary.  Will give anti-inflammatory and muscle relaxer.  Will give information for neurosurgery follow-up as patient states that she wants to follow-up with a specialist.  Have advised on return precautions.  She voices understanding plan and reasons to return.  All questions answered.  Patient stable for discharge.  Final Clinical Impression(s) / ED Diagnoses Final diagnoses:  Muscle spasm of back    Rx / DC Orders ED Discharge Orders         Ordered    lidocaine (LIDODERM) 5 %  Every 24 hours     02/05/20 1551    naproxen (NAPROSYN) 375 MG tablet  2 times daily     02/05/20 1551    methocarbamol (ROBAXIN) 750 MG tablet  At bedtime PRN     02/05/20 1551           Joyce Leckey S, PA-C 02/05/20 1605    Veryl Speak, MD 02/08/20 [phone]

## 2020-02-05 NOTE — Discharge Instructions (Addendum)
You may alternate taking Tylenol and Ibuprofen as needed for pain control. You may take 400-600 mg of ibuprofen every 6 hours and [phone] mg of Tylenol every 6 hours. Do not exceed 4000 mg of Tylenol daily as this can lead to liver damage. Also, make sure to take Ibuprofen with meals as it can cause an upset stomach. Do not take other NSAIDs while taking Ibuprofen such as (Aleve, Naprosyn, Aspirin, Celebrex, etc) and do not take more than the prescribed dose as this can lead to ulcers and bleeding in your GI tract. You may use warm and cold compresses to help with your symptoms.   You were given a prescription for Robaxin which is a muscle relaxer.  You should not drive, work, or operate machinery while taking this medication as it can make you very drowsy.    Please follow up with your primary doctor within the next 7-10 days for re-evaluation and further treatment of your symptoms.   Return to the emergency department immediately if you experience any back pain associated with fevers, loss of control of your bowels/bladder, weakness/numbness to your legs, numbness to your groin area, inability to walk, or inability to urinate.

## 2020-03-16 ENCOUNTER — Other Ambulatory Visit: Payer: Self-pay | Admitting: Physician Assistant

## 2020-03-16 DIAGNOSIS — M545 Low back pain, unspecified: Secondary | ICD-10-CM

## 2020-04-06 ENCOUNTER — Other Ambulatory Visit: Payer: Self-pay

## 2020-04-06 ENCOUNTER — Ambulatory Visit (HOSPITAL_COMMUNITY)
Admission: RE | Admit: 2020-04-06 | Discharge: 2020-04-06 | Disposition: A | Discharge status: Home / Self Care | Payer: 59 | Source: Ambulatory Visit | Attending: Physician Assistant | Admitting: Physician Assistant

## 2020-04-06 DIAGNOSIS — M545 Low back pain, unspecified: Secondary | ICD-10-CM

## 2020-05-11 ENCOUNTER — Other Ambulatory Visit: Payer: Self-pay | Admitting: Physician Assistant

## 2020-05-11 DIAGNOSIS — M545 Low back pain, unspecified: Secondary | ICD-10-CM

## 2020-05-22 ENCOUNTER — Ambulatory Visit
Admission: RE | Admit: 2020-05-22 | Discharge: 2020-05-22 | Disposition: A | Discharge status: Home / Self Care | Payer: 59 | Source: Ambulatory Visit | Attending: Physician Assistant | Admitting: Physician Assistant

## 2020-05-22 ENCOUNTER — Other Ambulatory Visit: Payer: Self-pay | Admitting: Physician Assistant

## 2020-05-22 ENCOUNTER — Other Ambulatory Visit: Payer: Self-pay

## 2020-05-22 DIAGNOSIS — M545 Low back pain, unspecified: Secondary | ICD-10-CM

## 2020-05-22 MED ORDER — METHYLPREDNISOLONE ACETATE 40 MG/ML INJ SUSP (RADIOLOG
120.0000 mg | Freq: Once | INTRAMUSCULAR | Status: AC
  Administered 2020-05-22: 120 mg via INTRA_ARTICULAR

## 2020-05-22 MED ORDER — IOPAMIDOL (ISOVUE-M 200) INJECTION 41%
1.0000 mL | Freq: Once | INTRAMUSCULAR | Status: AC
  Administered 2020-05-22: 1 mL via INTRA_ARTICULAR

## 2020-05-22 NOTE — Discharge Instructions (Signed)

## 2020-06-22 ENCOUNTER — Other Ambulatory Visit: Payer: Self-pay

## 2020-06-22 ENCOUNTER — Encounter (HOSPITAL_COMMUNITY): Payer: Self-pay

## 2020-06-22 DIAGNOSIS — I10 Essential (primary) hypertension: Secondary | ICD-10-CM | POA: Insufficient documentation

## 2020-06-22 DIAGNOSIS — R519 Headache, unspecified: Secondary | ICD-10-CM | POA: Diagnosis present

## 2020-06-22 DIAGNOSIS — Z79899 Other long term (current) drug therapy: Secondary | ICD-10-CM | POA: Insufficient documentation

## 2020-06-22 DIAGNOSIS — Z20822 Contact with and (suspected) exposure to covid-19: Secondary | ICD-10-CM | POA: Diagnosis not present

## 2020-06-22 LAB — SARS CORONAVIRUS 2 BY RT PCR (HOSPITAL ORDER, PERFORMED IN ~~LOC~~ HOSPITAL LAB): SARS Coronavirus 2: NEGATIVE

## 2020-06-22 NOTE — ED Triage Notes (Signed)
Pt to the er, pt c/o headache, states that she had a similar episode about two weeks ago where she felt like she was going to pass out.  States that there are some people at work who have covid.  States that she is here for her headache and to get tested for covid.

## 2020-06-23 ENCOUNTER — Emergency Department (HOSPITAL_COMMUNITY)
Admission: EM | Admit: 2020-06-23 | Discharge: 2020-06-23 | Disposition: A | Discharge status: Home / Self Care | Payer: 59 | Attending: Emergency Medicine | Admitting: Emergency Medicine

## 2020-06-23 DIAGNOSIS — R519 Headache, unspecified: Secondary | ICD-10-CM

## 2020-06-23 MED ORDER — METOCLOPRAMIDE HCL 5 MG/ML IJ SOLN
10.0000 mg | Freq: Once | INTRAMUSCULAR | Status: AC
  Administered 2020-06-23: 10 mg via INTRAVENOUS
  Filled 2020-06-23: qty 2

## 2020-06-23 MED ORDER — KETOROLAC TROMETHAMINE 30 MG/ML IJ SOLN
30.0000 mg | Freq: Once | INTRAMUSCULAR | Status: AC
  Administered 2020-06-23: 30 mg via INTRAVENOUS
  Filled 2020-06-23: qty 1

## 2020-06-23 MED ORDER — DIPHENHYDRAMINE HCL 50 MG/ML IJ SOLN
25.0000 mg | Freq: Once | INTRAMUSCULAR | Status: AC
  Administered 2020-06-23: 25 mg via INTRAVENOUS
  Filled 2020-06-23: qty 1

## 2020-06-23 NOTE — Discharge Instructions (Addendum)
Follow up as needed

## 2020-06-23 NOTE — ED Provider Notes (Signed)
[facility] EMERGENCY DEPARTMENT Provider Note   CSN: [id number] Arrival date & time: 06/22/20  2047     History Chief Complaint  Patient presents with  . Headache    Summer Christensen is a 69 y.o. female.  Patient complains of a headache.  Patient has history of headaches.  The history is provided by the patient and medical records. No language interpreter was used.  Headache Pain location:  Frontal Quality:  Dull Radiates to:  Does not radiate Severity currently:  7/10 Severity at highest:  8/10 Onset quality:  Sudden Timing:  Constant Progression:  Waxing and waning Chronicity:  New Similar to prior headaches: no   Context: not activity   Relieved by:  Nothing Worsened by:  Nothing Associated symptoms: no abdominal pain, no back pain, no congestion, no cough, no diarrhea, no fatigue, no seizures and no sinus pressure        Past Medical History:  Diagnosis Date  . 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 Chest pain   . Hx of hysterectomy   . Hypertension   . Migraine     Patient Active Problem List   Diagnosis Date Noted  . Hypertension   . LEFT BUNDLE BRANCH BLOCK 12/16/2010  . MIGRAINE HEADACHE 12/03/2010  . CHEST PAIN 12/03/2010    Past Surgical History:  Procedure Laterality Date  . ABDOMINAL HYSTERECTOMY    . COLONOSCOPY N/A 06/11/2016   Procedure: COLONOSCOPY;  Surgeon: Malissa Hippo, MD;  Location: [facility];  Service: Endoscopy;  Laterality: N/A;  2:00 - moved to 1:00 - Ann notified pt  . LAPAROSCOPIC CHOLECYSTECTOMY  2005   pain.  Marland Kitchen ROTATOR CUFF REPAIR  2009   arthroscopic  . TUBAL LIGATION     bilateral     OB History    Gravida  3   Para  3   Term  3   Preterm      AB      Living  3     SAB      TAB      Ectopic      Multiple      Live Births  3           Family History  Problem Relation Age of Onset  . Stroke Mother   . Heart attack Father     Social History   Tobacco Use  . Smoking status: Never  Smoker  . Smokeless tobacco: Never Used  Substance Use Topics  . Alcohol use: No  . Drug use: No    Home Medications Prior to Admission medications   Medication Sig Start Date End Date Taking? Authorizing Provider  cyclobenzaprine (FLEXERIL) 10 MG tablet Take 1 tablet (10 mg total) by mouth 2 (two) times daily as needed for muscle spasms. 09/13/19   Tegeler, Canary Brim, MD  lidocaine (LIDODERM) 5 % Place 1 patch onto the skin daily. Remove & Discard patch within 12 hours or as directed by MD 02/05/20   Couture, Cortni S, PA-C  lisinopril (PRINIVIL,ZESTRIL) 20 MG tablet Take 20 mg by mouth daily.  08/12/17   [provider]  Multiple Vitamins-Minerals (WOMENS MULTI VITAMIN & MINERAL) TABS Take 1 tablet by mouth once a week.     [provider]  naproxen (NAPROSYN) 375 MG tablet Take 1 tablet (375 mg total) by mouth 2 (two) times daily. 02/05/20   Couture, Cortni S, PA-C    Allergies    Patient has no known  allergies.  Review of Systems   Review of Systems  Constitutional: Negative for appetite change and fatigue.  HENT: Negative for congestion, ear discharge and sinus pressure.   Eyes: Negative for discharge.  Respiratory: Negative for cough.   Cardiovascular: Negative for chest pain.  Gastrointestinal: Negative for abdominal pain and diarrhea.  Genitourinary: Negative for frequency and hematuria.  Musculoskeletal: Negative for back pain.  Skin: Negative for rash.  Neurological: Positive for headaches. Negative for seizures.  Psychiatric/Behavioral: Negative for hallucinations.    Physical Exam Updated Vital Signs BP (!) 167/86 (BP Location: Left Arm)   Pulse 63   Temp 97.9 F (36.6 C) (Oral)   Resp 20   Ht 5\' 4"  (1.626 m)   Wt 63.5 kg   SpO2 100%   BMI 24.03 kg/m   Physical Exam Vitals and nursing note reviewed.  Constitutional:      Appearance: She is well-developed.  HENT:     Head: Normocephalic.     Nose: Nose normal.  Eyes:     General:  No scleral icterus.    Conjunctiva/sclera: Conjunctivae normal.  Neck:     Thyroid: No thyromegaly.  Cardiovascular:     Rate and Rhythm: Normal rate and regular rhythm.     Heart sounds: No murmur heard.  No friction rub. No gallop.   Pulmonary:     Breath sounds: No stridor. No wheezing or rales.  Chest:     Chest wall: No tenderness.  Abdominal:     General: There is no distension.     Tenderness: There is no abdominal tenderness. There is no rebound.  Musculoskeletal:        General: Normal range of motion.     Cervical back: Neck supple.  Lymphadenopathy:     Cervical: No cervical adenopathy.  Skin:    Findings: No erythema or rash.  Neurological:     Mental Status: She is alert and oriented to person, place, and time.     Motor: No abnormal muscle tone.     Coordination: Coordination normal.  Psychiatric:        Behavior: Behavior normal.     ED Results / Procedures / Treatments   Labs (all labs ordered are listed, but only abnormal results are displayed) Labs Reviewed  SARS CORONAVIRUS 2 BY RT PCR (HOSPITAL ORDER, PERFORMED IN [facility])    EKG None  Radiology No results found.  Procedures Procedures (including critical care time)  Medications Ordered in ED Medications  ketorolac (TORADOL) 30 MG/ML injection 30 mg (30 mg Intravenous Given 06/23/20 0801)  diphenhydrAMINE (BENADRYL) injection 25 mg (25 mg Intravenous Given 06/23/20 0802)  metoCLOPramide (REGLAN) injection 10 mg (10 mg Intravenous Given 06/23/20 0801)    ED Course  I have reviewed the triage vital signs and the nursing notes.  Pertinent labs & imaging results that were available during my care of the patient were reviewed by me and considered in my medical decision making (see chart for details).    MDM Rules/Calculators/A&P                          Patient with a migraine headache.  Patient was given a migraine cocktail and feels better.       This patient presents  to the ED for concern of headache, this involves an extensive number of treatment options, and is a complaint that carries with it a high risk of complications and morbidity.  The differential diagnosis includes migraine headache cerebral bleed   Lab Tests:     Medicines ordered:   I ordered medication Toradol Reglan and Benadryl for headache  Imaging Studies ordered:   Additional history obtained:   Additional history obtained from record  Previous records obtained and reviewed.  Consultations Obtained:     Reevaluation:  After the interventions stated above, I reevaluated the patient and found improved  Critical Interventions:  .   Final Clinical Impression(s) / ED Diagnoses Final diagnoses:  Bad headache    Rx / DC Orders ED Discharge Orders    None       Bethann Berkshire, MD 06/23/20 [id number]

## 2020-07-27 IMAGING — CT CT RENAL STONE PROTOCOL
2 of 8 series · 16 of 46 positions shown, 18 images · non-contrast
Comparison: None.

CLINICAL DATA: Flank pain. Stone disease suspected.

EXAM:
CT ABDOMEN AND PELVIS WITHOUT CONTRAST
TECHNIQUE: Multidetector CT imaging of the abdomen and pelvis was performed
following the standard protocol without IV contrast.

[Series 3: thins · axial · 0.71mm/px · z∈[-669,-234]mm · 13 of 491 slices shown, 15 images]
[im 28/491  soft-tissue]
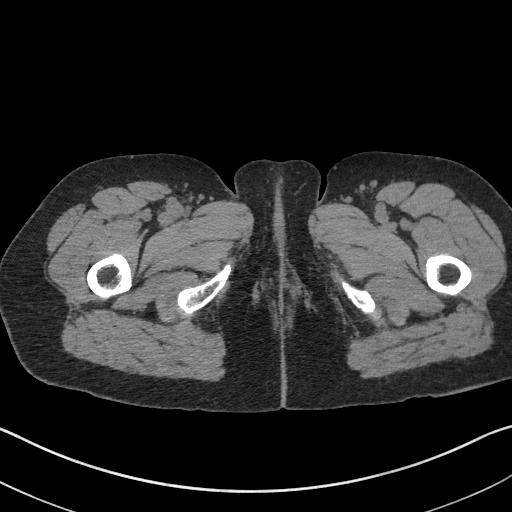
[im 28/491  bone]
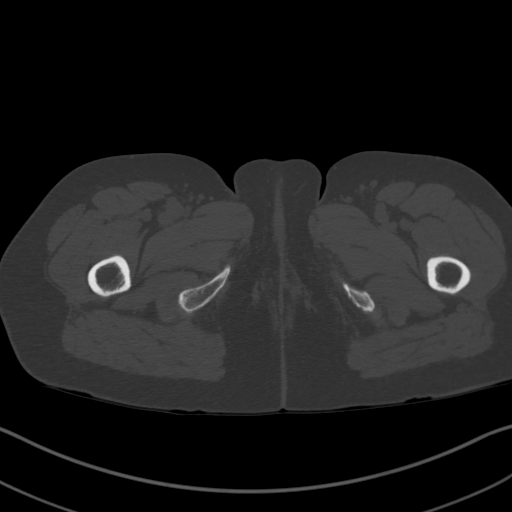
[im 55/491  soft-tissue]
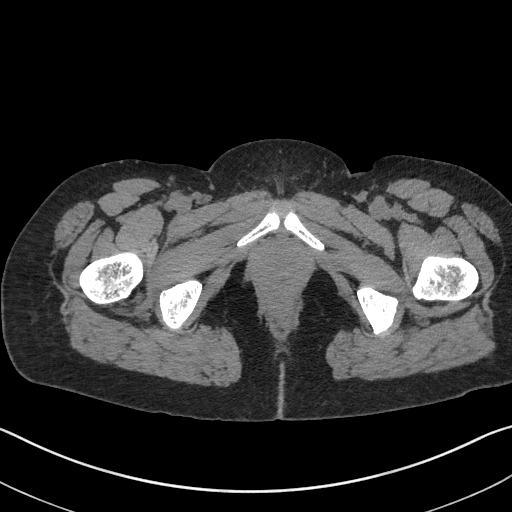
[im 109/491  soft-tissue]
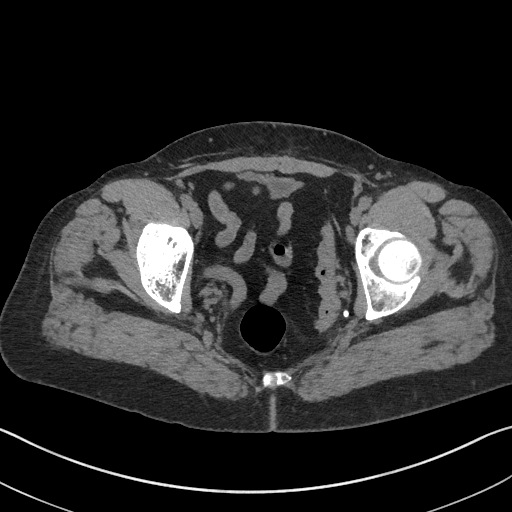
[im 137/491  soft-tissue]
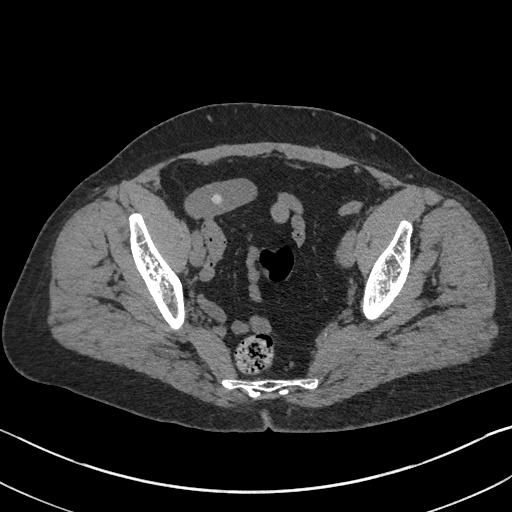
[im 164/491  soft-tissue]
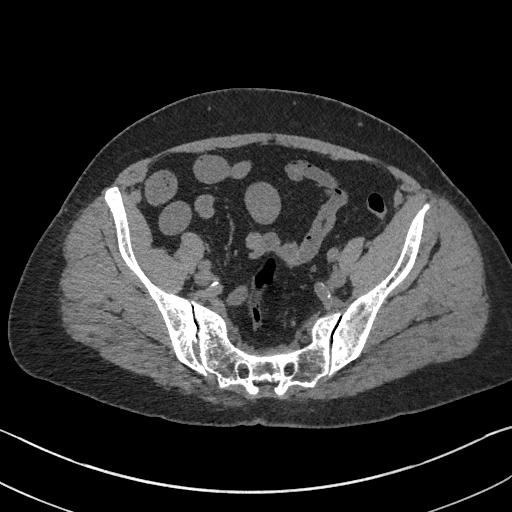
[im 218/491  soft-tissue]
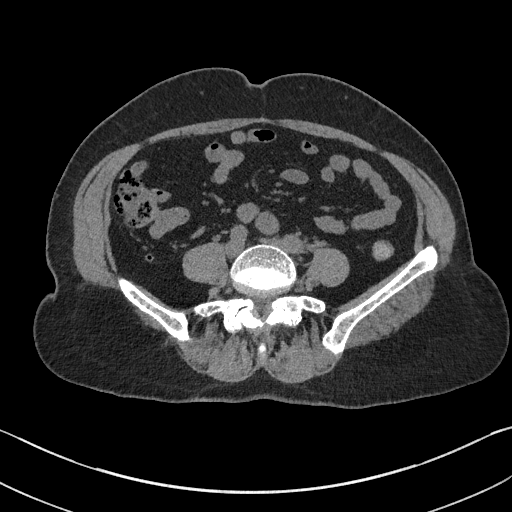
[im 246/491  soft-tissue]
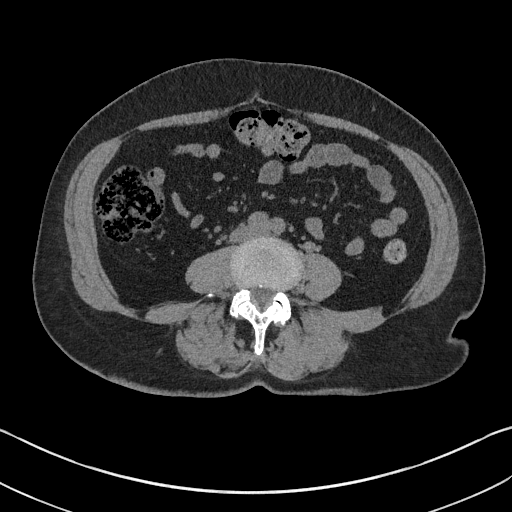
[im 273/491  soft-tissue]
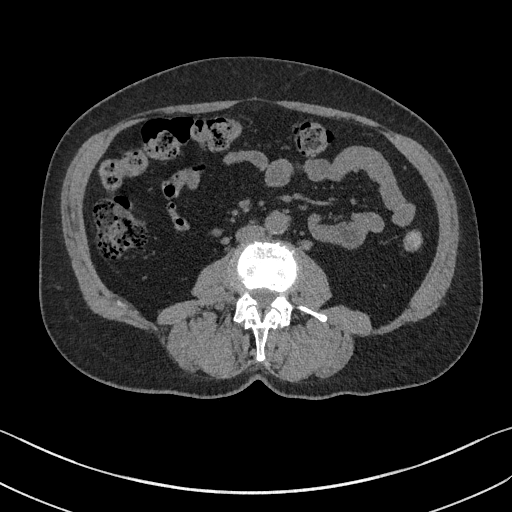
[im 327/491  soft-tissue]
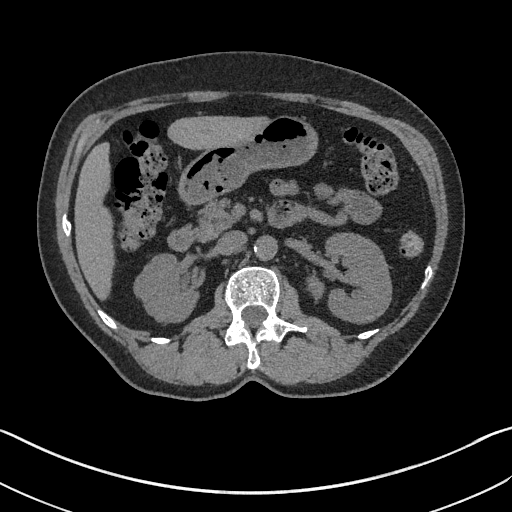
[im 327/491  bone]
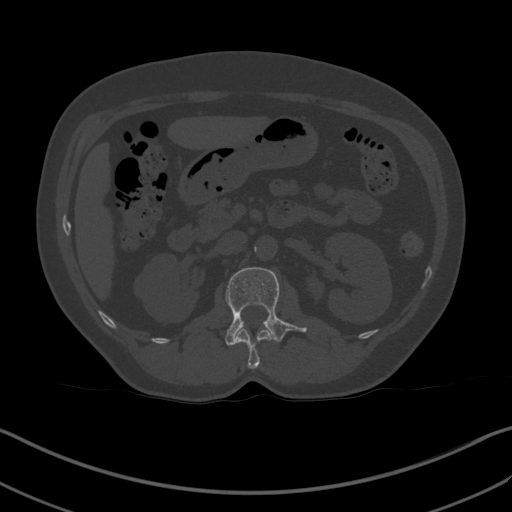
[im 354/491  soft-tissue]
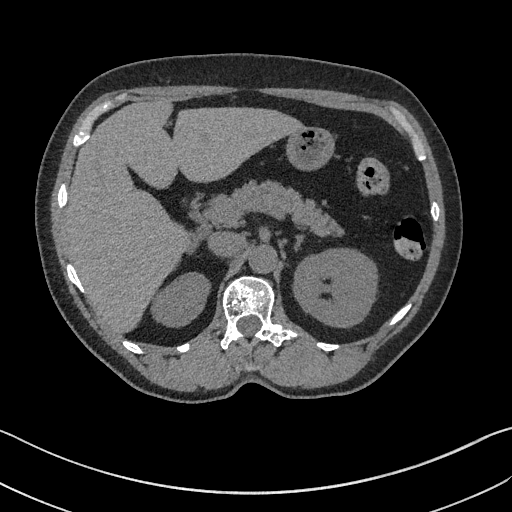
[im 382/491  soft-tissue]
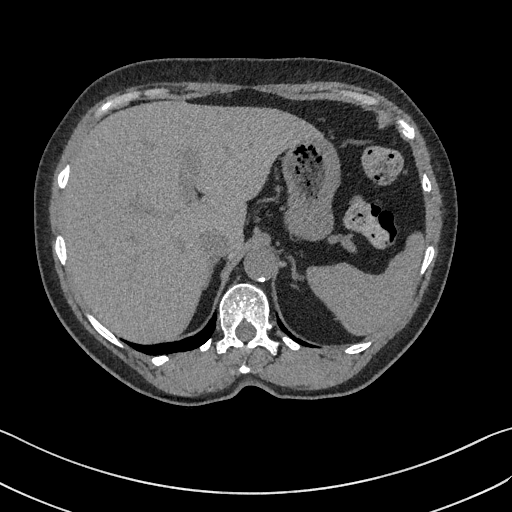
[im 436/491  soft-tissue]
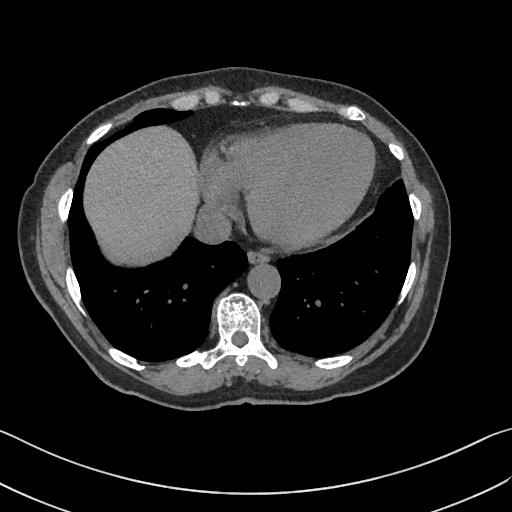
[im 463/491  soft-tissue]
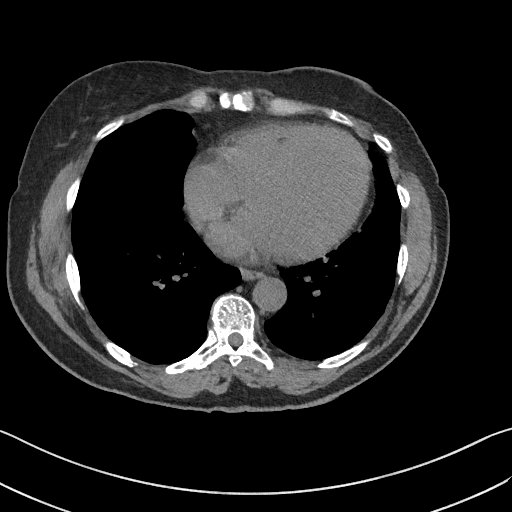

[Series 5: coronal st · coronal · 0.74mm/px · 3 of 101 slices shown]
[im 26/101  soft-tissue]
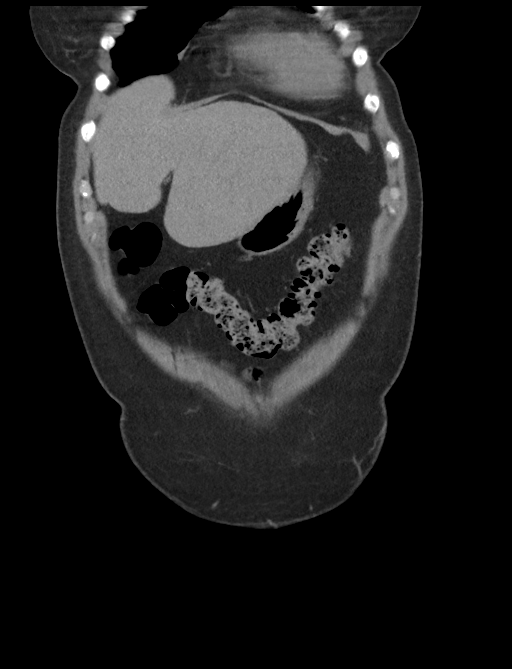
[im 51/101  soft-tissue]
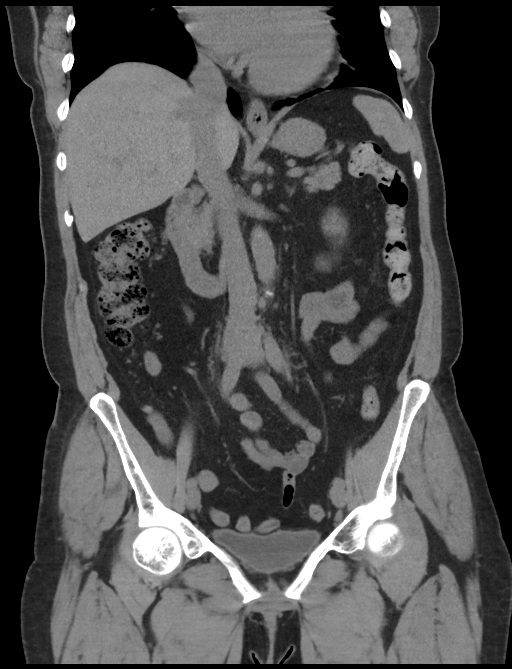
[im 76/101  soft-tissue]
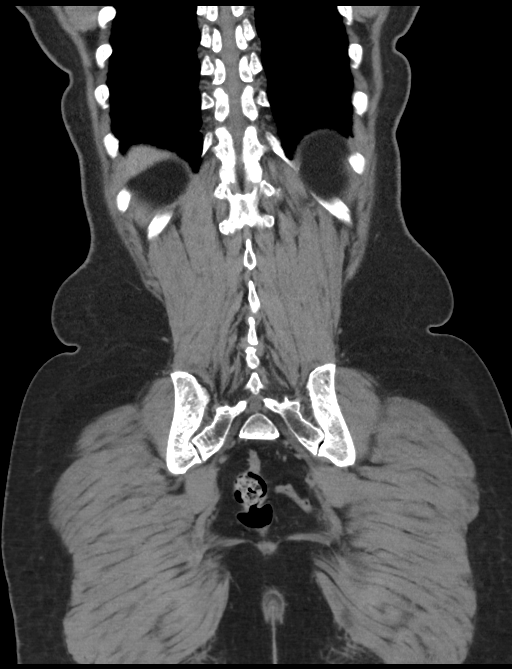

[16 of 46 positions shown; findings below may reference images not displayed]

FINDINGS: Lower chest: No acute findings.

Hepatobiliary: A 2.4 cm low-attenuation mass is seen in segment 7 of
the right lobe. A tiny sub-cm low-attenuation lesion is seen in
segment 4A on image [DATE]. These cannot be characterized without IV
contrast. Prior cholecystectomy. No evidence of biliary obstruction.

Pancreas: No mass or inflammatory process visualized on this
unenhanced exam.

Spleen:  Within normal limits in size.

Adrenals/Urinary tract: Moderate right renal parenchymal scarring. 2
mm calculus noted in lower pole of left kidney. No evidence of
ureteral calculi or hydronephrosis.

Stomach/Bowel: No evidence of obstruction, inflammatory process, or
abnormal fluid collections. Normal appendix visualized.

Vascular/Lymphatic: No pathologically enlarged lymph nodes
identified. No evidence of abdominal aortic aneurysm. Aortic
atherosclerosis.

Reproductive: Prior hysterectomy noted. Adnexal regions are
unremarkable in appearance.

Other:  None.

Musculoskeletal:  No suspicious bone lesions identified.
IMPRESSION: 1. Tiny left renal calculus. No evidence of ureteral calculi,
hydronephrosis, or other acute findings.
2. Moderate right renal parenchymal scarring.
3. 2.4 cm low-attenuation mass in right lobe of liver, and tiny
sub-cm lesion in segment 4A of the liver, which cannot be
characterized without IV contrast. Nonemergent abdomen MRI without
and with contrast recommended for further characterization.

Aortic Atherosclerosis ([id number].O).

## 2020-10-25 ENCOUNTER — Other Ambulatory Visit (HOSPITAL_COMMUNITY): Payer: Self-pay | Admitting: Emergency Medicine

## 2020-10-25 DIAGNOSIS — Z1231 Encounter for screening mammogram for malignant neoplasm of breast: Secondary | ICD-10-CM

## 2020-11-07 ENCOUNTER — Ambulatory Visit (HOSPITAL_COMMUNITY)
Admission: RE | Admit: 2020-11-07 | Discharge: 2020-11-07 | Disposition: A | Discharge status: Home / Self Care | Payer: 59 | Source: Ambulatory Visit | Attending: Emergency Medicine | Admitting: Emergency Medicine

## 2020-11-07 ENCOUNTER — Other Ambulatory Visit: Payer: Self-pay

## 2020-11-07 DIAGNOSIS — Z1231 Encounter for screening mammogram for malignant neoplasm of breast: Secondary | ICD-10-CM | POA: Diagnosis present

## 2021-01-14 ENCOUNTER — Ambulatory Visit: Payer: 59

## 2021-01-14 ENCOUNTER — Encounter: Payer: Self-pay | Admitting: Orthopedic Surgery

## 2021-01-14 ENCOUNTER — Ambulatory Visit: Payer: 59 | Admitting: Orthopedic Surgery

## 2021-01-14 ENCOUNTER — Other Ambulatory Visit: Payer: Self-pay

## 2021-01-14 VITALS — BP 173/76 | HR 70 | Ht 64.0 in | Wt 140.0 lb

## 2021-01-14 DIAGNOSIS — M25511 Pain in right shoulder: Secondary | ICD-10-CM

## 2021-01-14 DIAGNOSIS — G8929 Other chronic pain: Secondary | ICD-10-CM

## 2021-01-14 DIAGNOSIS — F419 Anxiety disorder, unspecified: Secondary | ICD-10-CM | POA: Insufficient documentation

## 2021-01-14 DIAGNOSIS — M7541 Impingement syndrome of right shoulder: Secondary | ICD-10-CM | POA: Diagnosis not present

## 2021-01-14 DIAGNOSIS — M25811 Other specified joint disorders, right shoulder: Secondary | ICD-10-CM

## 2021-01-14 DIAGNOSIS — R1900 Intra-abdominal and pelvic swelling, mass and lump, unspecified site: Secondary | ICD-10-CM | POA: Insufficient documentation

## 2021-01-14 DIAGNOSIS — M5430 Sciatica, unspecified side: Secondary | ICD-10-CM | POA: Insufficient documentation

## 2021-01-14 DIAGNOSIS — G894 Chronic pain syndrome: Secondary | ICD-10-CM | POA: Insufficient documentation

## 2021-01-14 DIAGNOSIS — F329 Major depressive disorder, single episode, unspecified: Secondary | ICD-10-CM | POA: Insufficient documentation

## 2021-01-14 DIAGNOSIS — K219 Gastro-esophageal reflux disease without esophagitis: Secondary | ICD-10-CM | POA: Insufficient documentation

## 2021-01-14 NOTE — Progress Notes (Addendum)
Chief Complaint  Patient presents with  . Shoulder Pain    Right  More than a month but less than 6  Hard to raise    70 year old female presents with 4 to 27-month history of atraumatic onset of right shoulder pain associated with decreased range of motion especially internal rotation and forward elevation as she has painful elevation starting at 90 degrees up through terminal flexion  She is status post left rotator cuff surgery years ago successfully  Past Medical History:  Diagnosis Date  . 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 Chest pain   . Hx of hysterectomy   . Hypertension   . Migraine     Past Surgical History:  Procedure Laterality Date  . ABDOMINAL HYSTERECTOMY    . COLONOSCOPY N/A 06/11/2016   Procedure: COLONOSCOPY;  Surgeon: Malissa Hippo, MD;  Location: [facility];  Service: Endoscopy;  Laterality: N/A;  2:00 - moved to 1:00 - Ann notified pt  . LAPAROSCOPIC CHOLECYSTECTOMY  2005   pain.  Marland Kitchen ROTATOR CUFF REPAIR  2009   arthroscopic  . TUBAL LIGATION     bilateral    BP (!) 173/76   Pulse 70   Ht 5\' 4"  (1.626 m)   Wt 140 lb (63.5 kg)   BMI 24.03 kg/m   Physical Exam Constitutional:      General: She is not in acute distress.    Appearance: She is well-developed.     Comments: Well developed, well nourished Normal grooming and hygiene     Cardiovascular:     Comments: No peripheral edema Musculoskeletal:     Right shoulder: Tenderness and crepitus present. Decreased range of motion. Decreased strength.     Left shoulder: Normal.     Comments: Apprehension negative  Impingement positive  Skin:    General: Skin is warm and dry.  Neurological:     Mental Status: She is alert and oriented to person, place, and time.     Sensory: No sensory deficit.     Coordination: Coordination normal.     Gait: Gait normal.     Deep Tendon Reflexes: Reflexes are normal and symmetric.  Psychiatric:        Mood and Affect: Mood normal.        Behavior:  Behavior normal.        Thought Content: Thought content normal.        Judgment: Judgment normal.     Comments: Affect normal     X-ray glenohumeral joint normal  Slight elevation of the humeral head relation to the glenoid  Encounter Diagnoses  Name Primary?  . Chronic right shoulder pain   . Shoulder impingement, right Yes    Recommend Codman exercises to regain motion Naproxen over-the-counter Injection 4-week follow-up  Procedure note Inject right shoulder subacromial space Medications Celestone 6 mg 1% lidocaine Technique posterior approach subacromial space 20-gauge needle alcohol ethyl chloride Complications none  Acute uncomplicated injury illness injection x-rays done internally

## 2021-01-14 NOTE — Patient Instructions (Addendum)
Take naproxen twice a day   Shoulder Pain Many things can cause shoulder pain, including:  An injury.  Moving the shoulder in the same way again and again (overuse).  Joint pain (arthritis). Pain can come from:  Swelling and irritation (inflammation) of any part of the shoulder.  An injury to the shoulder joint.  An injury to: ? Tissues that connect muscle to bone (tendons). ? Tissues that connect bones to each other (ligaments). ? Bones. Follow these instructions at home: Watch for changes in your symptoms. Let your doctor know about them. Follow these instructions to help with your pain. If you have a sling:  Wear the sling as told by your doctor. Remove it only as told by your doctor.  Loosen the sling if your fingers: ? Tingle. ? Become numb. ? Turn cold and blue.  Keep the sling clean.  If the sling is not waterproof: ? Do not let it get wet. ? Take the sling off when you shower or bathe. Managing pain, stiffness, and swelling  If told, put ice on the painful area: ? Put ice in a plastic bag. ? Place a towel between your skin and the bag. ? Leave the ice on for 20 minutes, 2-3 times a day. Stop putting ice on if it does not help with the pain.  Squeeze a soft ball or a foam pad as much as possible. This prevents swelling in the shoulder. It also helps to strengthen the arm.   General instructions  Take over-the-counter and prescription medicines only as told by your doctor.  Keep all follow-up visits as told by your doctor. This is important. Contact a doctor if:  Your pain gets worse.  Medicine does not help your pain.  You have new pain in your arm, hand, or fingers. Get help right away if:  Your arm, hand, or fingers: ? Tingle. ? Are numb. ? Are swollen. ? Are painful. ? Turn white or blue. Summary  Shoulder pain can be caused by many things. These include injury, moving the shoulder in the same away again and again, and joint pain.  Watch  for changes in your symptoms. Let your doctor know about them.  This condition may be treated with a sling, ice, and pain medicine.  Contact your doctor if the pain gets worse or you have new pain. Get help right away if your arm, hand, or fingers tingle or get numb, swollen, or painful.  Keep all follow-up visits as told by your doctor. This is important. This information is not intended to replace advice given to you by your health care provider. Make sure you discuss any questions you have with your health care provider. Document Revised: 04/20/2018 Document Reviewed: 04/20/2018 Elsevier Patient Education  2021 [employer].

## 2021-01-15 ENCOUNTER — Ambulatory Visit: Payer: 59 | Admitting: Orthopaedic Surgery

## 2021-02-13 ENCOUNTER — Other Ambulatory Visit: Payer: Self-pay

## 2021-02-13 ENCOUNTER — Ambulatory Visit: Payer: 59 | Admitting: Orthopedic Surgery

## 2021-02-13 ENCOUNTER — Encounter: Payer: Self-pay | Admitting: Orthopedic Surgery

## 2021-02-13 VITALS — BP 168/99 | HR 79 | Ht 64.0 in | Wt 140.0 lb

## 2021-02-13 DIAGNOSIS — M25511 Pain in right shoulder: Secondary | ICD-10-CM | POA: Diagnosis not present

## 2021-02-13 DIAGNOSIS — G8929 Other chronic pain: Secondary | ICD-10-CM

## 2021-02-13 NOTE — Progress Notes (Signed)
Chief Complaint  Patient presents with  . Shoulder Pain    Right    70 year old female with 78-month history of atraumatic right shoulder pain we treated her with Codman exercises, subacromial injection and naproxen over-the-counter and asked her to come back in 4 weeks  ROS BACK PAIN    she still having significant pain with overhead activity.  She has had a trial of nonoperative care as listed above  Recommend MRI right shoulder for rotator cuff tear

## 2021-02-13 NOTE — Patient Instructions (Signed)
While we are working on your approval for MRI please go ahead and call to schedule your appointment with [facility] within at least one (1) week.   Central Scheduling [phone]  

## 2021-02-14 ENCOUNTER — Telehealth: Payer: Self-pay | Admitting: Orthopedic Surgery

## 2021-02-14 NOTE — Telephone Encounter (Addendum)
Sent to [place] in error. I have resent clinical information. Thanks

## 2021-02-14 NOTE — Telephone Encounter (Signed)
Amy RN called requesting clinic for the MRI we requested for this patient.  She gave a fax number [phone]   That is all she said

## 2021-02-14 NOTE — Telephone Encounter (Signed)
(  Okey Regal) received call back 3:28pm, from Amy,RN at [facility] with approval. Had on hold for Amy; she had hung up.Ph# [phone].

## 2021-02-28 ENCOUNTER — Ambulatory Visit (HOSPITAL_COMMUNITY)
Admission: RE | Admit: 2021-02-28 | Discharge: 2021-02-28 | Disposition: A | Discharge status: Home / Self Care | Payer: 59 | Source: Ambulatory Visit | Attending: Orthopedic Surgery | Admitting: Orthopedic Surgery

## 2021-02-28 DIAGNOSIS — M25511 Pain in right shoulder: Secondary | ICD-10-CM | POA: Insufficient documentation

## 2021-02-28 DIAGNOSIS — G8929 Other chronic pain: Secondary | ICD-10-CM | POA: Insufficient documentation

## 2021-03-07 ENCOUNTER — Other Ambulatory Visit: Payer: Self-pay

## 2021-03-07 ENCOUNTER — Ambulatory Visit: Payer: 59 | Admitting: Orthopedic Surgery

## 2021-03-07 ENCOUNTER — Encounter: Payer: Self-pay | Admitting: Orthopedic Surgery

## 2021-03-07 VITALS — BP 176/84 | HR 75 | Ht 64.0 in | Wt 140.0 lb

## 2021-03-07 DIAGNOSIS — M75111 Incomplete rotator cuff tear or rupture of right shoulder, not specified as traumatic: Secondary | ICD-10-CM | POA: Diagnosis not present

## 2021-03-07 NOTE — Progress Notes (Signed)
Follow up mri  Chief Complaint  Patient presents with  . Shoulder Pain    Right/ review MRI Feels better is on prednisone for back pain and it is helping shoulder too    Encounter Diagnosis  Name Primary?  Marland Kitchen Nontraumatic incomplete tear of right rotator cuff Yes    70 year old female works at the [employer] presents for reevaluation with her MRI.  She has a partial rotator cuff tear  She has painful range of motion but her active range of motion is 150 degrees of flexion she has supraspinatus weakness in abduction weakness  MRI MY READING   The MRI shows a few fibers of the supraspinatus tendon still intact but the undersurface shows retraction approximately a centimeter to a centimeter and a half  She is on prednisone currently for lower back pain and that seems to be helping her shoulder pain but not the weakness  We discussed this and I recommended she try to avoid surgery.  However if the pain or weakness becomes symptomatic again and she cannot tolerate it then surgical repair would be in order but she should expect to be out of work for 6 to 9 months and may not be able to return to that particular job  MRI READING  IMPRESSION: 1. Moderate rotator cuff tendinopathy/tendinosis. There is a fairly large rim rent type articular surface tear involving the supraspinatus tendon which is technically an interstitial tear. There is retraction of the footprint attachment fibers approximately 9 mm. Far anteriorly I believe there is a small perforation involving the bursal surface and associated fluid in the subacromial/subdeltoid bursa. 2. Intact long head biceps tendon and glenoid labrum. 3. Moderate AC joint degenerative changes but no other significant findings for bony impingement. 4. Mild synovitis versus adhesive capsulitis.   Electronically Signed   By: Rudie Meyer M.D.   On: 03/01/2021 08:20

## 2021-04-18 ENCOUNTER — Encounter: Payer: Self-pay | Admitting: Orthopedic Surgery

## 2021-04-18 ENCOUNTER — Ambulatory Visit: Payer: 59 | Admitting: Orthopedic Surgery

## 2021-04-18 NOTE — Progress Notes (Deleted)
FOLLOW UP   Encounter Diagnosis  Name Primary?   Nontraumatic incomplete tear of right rotator cuff Yes   PREVIOUS mri IMPRESSION: 1. Moderate rotator cuff tendinopathy/tendinosis. There is a fairly large rim rent type articular surface tear involving the supraspinatus tendon which is technically an interstitial tear. There is retraction of the footprint attachment fibers approximately 9 mm. Far anteriorly I believe there is a small perforation involving the bursal surface and associated fluid in the subacromial/subdeltoid bursa. 2. Intact long head biceps tendon and glenoid labrum. 3. Moderate AC joint degenerative changes but no other significant findings for bony impingement. 4. Mild synovitis versus adhesive capsulitis.     Electronically Signed   By: Rudie Meyer M.D.  No chief complaint on file.    ***

## 2021-12-25 ENCOUNTER — Encounter (HOSPITAL_COMMUNITY): Payer: Self-pay | Admitting: Emergency Medicine

## 2021-12-25 ENCOUNTER — Emergency Department (HOSPITAL_COMMUNITY): Payer: 59

## 2021-12-25 ENCOUNTER — Other Ambulatory Visit: Payer: Self-pay

## 2021-12-25 ENCOUNTER — Emergency Department (HOSPITAL_COMMUNITY)
Admission: EM | Admit: 2021-12-25 | Discharge: 2021-12-25 | Disposition: A | Discharge status: Home / Self Care | Payer: 59 | Attending: Emergency Medicine | Admitting: Emergency Medicine

## 2021-12-25 DIAGNOSIS — N200 Calculus of kidney: Secondary | ICD-10-CM | POA: Insufficient documentation

## 2021-12-25 DIAGNOSIS — R1032 Left lower quadrant pain: Secondary | ICD-10-CM | POA: Diagnosis present

## 2021-12-25 DIAGNOSIS — I10 Essential (primary) hypertension: Secondary | ICD-10-CM | POA: Diagnosis not present

## 2021-12-25 DIAGNOSIS — R739 Hyperglycemia, unspecified: Secondary | ICD-10-CM | POA: Diagnosis not present

## 2021-12-25 DIAGNOSIS — I517 Cardiomegaly: Secondary | ICD-10-CM | POA: Insufficient documentation

## 2021-12-25 DIAGNOSIS — Z79899 Other long term (current) drug therapy: Secondary | ICD-10-CM | POA: Insufficient documentation

## 2021-12-25 LAB — COMPREHENSIVE METABOLIC PANEL
ALT: 53 U/L — ABNORMAL HIGH (ref 0–44)
AST: 44 U/L — ABNORMAL HIGH (ref 15–41)
Albumin: 4.2 g/dL (ref 3.5–5.0)
Alkaline Phosphatase: 77 U/L (ref 38–126)
Anion gap: 8 (ref 5–15)
BUN: 15 mg/dL (ref 8–23)
CO2: 24 mmol/L (ref 22–32)
Calcium: 9.7 mg/dL (ref 8.9–10.3)
Chloride: 106 mmol/L (ref 98–111)
Creatinine, Ser: 0.89 mg/dL (ref 0.44–1.00)
GFR, Estimated: >60 mL/min (ref >60)
Glucose, Bld: 211 mg/dL — ABNORMAL HIGH (ref 70–99)
Potassium: 3.6 mmol/L (ref 3.5–5.1)
Sodium: 138 mmol/L (ref 135–145)
Total Bilirubin: 0.5 mg/dL (ref 0.3–1.2)
Total Protein: 7.2 g/dL (ref 6.5–8.1)

## 2021-12-25 LAB — CBC WITH DIFFERENTIAL/PLATELET
Abs Immature Granulocytes: 0.05 10*3/uL (ref 0.00–0.07)
Basophils Absolute: 0 10*3/uL (ref 0.0–0.1)
Basophils Relative: 0 %
Eosinophils Absolute: 0.1 10*3/uL (ref 0.0–0.5)
Eosinophils Relative: 0 %
HCT: 37.9 % (ref 36.0–46.0)
Hemoglobin: 12.3 g/dL (ref 12.0–15.0)
Immature Granulocytes: 0 %
Lymphocytes Relative: 9 %
Lymphs Abs: 1.2 10*3/uL (ref 0.7–4.0)
MCH: 28 pg (ref 26.0–34.0)
MCHC: 32.5 g/dL (ref 30.0–36.0)
MCV: 86.1 fL (ref 80.0–100.0)
Monocytes Absolute: 0.7 10*3/uL (ref 0.1–1.0)
Monocytes Relative: 5 %
Neutro Abs: 10.9 10*3/uL — ABNORMAL HIGH (ref 1.7–7.7)
Neutrophils Relative %: 86 %
Platelets: 204 10*3/uL (ref 150–400)
RBC: 4.4 MIL/uL (ref 3.87–5.11)
RDW: 12.7 % (ref 11.5–15.5)
WBC: 12.9 10*3/uL — ABNORMAL HIGH (ref 4.0–10.5)
nRBC: 0 % (ref 0.0–0.2)

## 2021-12-25 LAB — URINALYSIS, ROUTINE W REFLEX MICROSCOPIC
Bacteria, UA: NONE SEEN
Bilirubin Urine: NEGATIVE
Glucose, UA: 50 mg/dL — AB
Ketones, ur: NEGATIVE mg/dL
Nitrite: NEGATIVE
Protein, ur: 30 mg/dL — AB
Specific Gravity, Urine: 1.01 (ref 1.005–1.030)
pH: 6 (ref 5.0–8.0)

## 2021-12-25 LAB — LIPASE, BLOOD: Lipase: 37 U/L (ref 11–51)

## 2021-12-25 MED ORDER — TAMSULOSIN HCL 0.4 MG PO CAPS: 0.4000 mg | ORAL_CAPSULE | Freq: Every day | ORAL | 0 refills | Status: DC

## 2021-12-25 MED ORDER — OXYCODONE-ACETAMINOPHEN 5-325 MG PO TABS: 1.0000 | ORAL_TABLET | Freq: Four times a day (QID) | ORAL | 0 refills | Status: DC | PRN

## 2021-12-25 MED ORDER — SODIUM CHLORIDE 0.9 % IV BOLUS
1000.0000 mL | Freq: Once | INTRAVENOUS | Status: AC
  Administered 2021-12-25: 1000 mL via INTRAVENOUS

## 2021-12-25 MED ORDER — ONDANSETRON 4 MG PO TBDP: 4.0000 mg | ORAL_TABLET | Freq: Three times a day (TID) | ORAL | 0 refills | Status: DC | PRN

## 2021-12-25 MED ORDER — KETOROLAC TROMETHAMINE 30 MG/ML IJ SOLN
30.0000 mg | Freq: Once | INTRAMUSCULAR | Status: AC
  Administered 2021-12-25: 30 mg via INTRAVENOUS
  Filled 2021-12-25: qty 1

## 2021-12-25 MED ORDER — MORPHINE SULFATE (PF) 4 MG/ML IV SOLN
4.0000 mg | Freq: Once | INTRAVENOUS | Status: AC
  Administered 2021-12-25: 4 mg via INTRAVENOUS
  Filled 2021-12-25: qty 1

## 2021-12-25 MED ORDER — ONDANSETRON HCL 4 MG/2ML IJ SOLN
4.0000 mg | Freq: Once | INTRAMUSCULAR | Status: AC
  Administered 2021-12-25: 4 mg via INTRAVENOUS
  Filled 2021-12-25: qty 2

## 2021-12-25 NOTE — Discharge Instructions (Addendum)
You will need to get your blood sugar rechecked by your PCP.  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  This is likely due to your history of high blood pressure.  You need to follow up with the urologist for your kidney stone. ? ?Return if worse. ?

## 2021-12-25 NOTE — ED Triage Notes (Signed)
Pt c/o left lower abd pain with vomiting. Pt states she feels like she is constipated, last bm was yesterday.  ?

## 2021-12-25 NOTE — ED Provider Notes (Signed)
[facility] EMERGENCY DEPARTMENT Provider Note   CSN: [id number] Arrival date & time: 12/25/21  7616     History  Chief Complaint  Patient presents with   Abdominal Pain    Summer Christensen is a 71 y.o. female.  Pt is a 71 yo female with a hx of htn and migraines.  She was awake this morning around 0300 and had a severe pain in her left back and llq.  She had some n/v as well.  She has never had anything like this in the past.  She had a small bm this morning and the pain was still there.        Home Medications Prior to Admission medications   Medication Sig Start Date End Date Taking? Authorizing Provider  atorvastatin (LIPITOR) 40 MG tablet Take 40 mg by mouth daily. 12/12/21  Yes [provider]  lisinopril (PRINIVIL,ZESTRIL) 20 MG tablet Take 20 mg by mouth daily.  08/12/17  Yes [provider]  losartan (COZAAR) 25 MG tablet Take 25 mg by mouth daily. 12/12/21  Yes [provider]  ondansetron (ZOFRAN-ODT) 4 MG disintegrating tablet Take 1 tablet (4 mg total) by mouth every 8 (eight) hours as needed for nausea or vomiting. 12/25/21  Yes Jacalyn Lefevre, MD  oxyCODONE-acetaminophen (PERCOCET/ROXICET) 5-325 MG tablet Take 1 tablet by mouth every 6 (six) hours as needed for severe pain. 12/25/21  Yes Jacalyn Lefevre, MD  tamsulosin (FLOMAX) 0.4 MG CAPS capsule Take 1 capsule (0.4 mg total) by mouth daily. 12/25/21  Yes Jacalyn Lefevre, MD  naproxen (NAPROSYN) 375 MG tablet Take 1 tablet (375 mg total) by mouth 2 (two) times daily. Patient not taking: Reported on 12/25/2021 02/05/20   Couture, Cortni S, PA-C      Allergies    Patient has no known allergies.    Review of Systems   Review of Systems  Gastrointestinal:  Positive for abdominal pain, nausea and vomiting.  Genitourinary:  Positive for flank pain.  All other systems reviewed and are negative.  Physical Exam Updated Vital Signs BP (!) 156/82    Pulse (!) 102    Temp 98.2 F (36.8 C) (Oral)    Resp  19    Ht 5\' 4"  (1.626 m)    Wt 65.8 kg    SpO2 99%    BMI 24.89 kg/m  Physical Exam Vitals and nursing note reviewed.  Constitutional:      Appearance: She is well-developed.  HENT:     Head: Normocephalic and atraumatic.     Mouth/Throat:     Mouth: Mucous membranes are moist.     Pharynx: Oropharynx is clear.  Eyes:     Extraocular Movements: Extraocular movements intact.     Pupils: Pupils are equal, round, and reactive to light.  Cardiovascular:     Rate and Rhythm: Normal rate and regular rhythm.     Heart sounds: Normal heart sounds.  Pulmonary:     Effort: Pulmonary effort is normal.     Breath sounds: Normal breath sounds.  Abdominal:     General: Abdomen is flat. Bowel sounds are normal.     Palpations: Abdomen is soft.     Tenderness: There is abdominal tenderness in the left lower quadrant.  Skin:    General: Skin is warm.     Capillary Refill: Capillary refill takes less than 2 seconds.  Neurological:     General: No focal deficit present.     Mental Status: She is alert and  oriented to person, place, and time.  Psychiatric:        Mood and Affect: Mood normal.        Behavior: Behavior normal.    ED Results / Procedures / Treatments   Labs (all labs ordered are listed, but only abnormal results are displayed) Labs Reviewed  CBC WITH DIFFERENTIAL/PLATELET - Abnormal; Notable for the following components:      Result Value   WBC 12.9 (*)    Neutro Abs 10.9 (*)    All other components within normal limits  COMPREHENSIVE METABOLIC PANEL - Abnormal; Notable for the following components:   Glucose, Bld 211 (*)    AST 44 (*)    ALT 53 (*)    All other components within normal limits  URINALYSIS, ROUTINE W REFLEX MICROSCOPIC - Abnormal; Notable for the following components:   Glucose, UA 50 (*)    Hgb urine dipstick MODERATE (*)    Protein, ur 30 (*)    Leukocytes,Ua SMALL (*)    All other components within normal limits  LIPASE, BLOOD     EKG None  Radiology CT RENAL STONE STUDY  Result Date: 12/25/2021 CLINICAL DATA:  71 year old female with history of left-sided flank pain this morning. EXAM: CT ABDOMEN AND PELVIS WITHOUT CONTRAST TECHNIQUE: Multidetector CT imaging of the abdomen and pelvis was performed following the standard protocol without IV contrast. RADIATION DOSE REDUCTION: This exam was performed according to the departmental dose-optimization program which includes automated exposure control, adjustment of the mA and/or kV according to patient size and/or use of iterative reconstruction technique. COMPARISON:  CT the abdomen and pelvis 06/05/2019. FINDINGS: Lower chest: Atherosclerotic calcifications in the descending thoracic aorta. Cardiomegaly with left ventricular dilatation. Hepatobiliary: No definite suspicious cystic or solid hepatic lesions are confidently identified on today's noncontrast CT examination. Small calcified granuloma in the periphery of segment 8 of the liver incidentally noted. Status post cholecystectomy. Pancreas: No definite pancreatic mass or peripancreatic fluid collections or inflammatory changes are noted on today's noncontrast CT examination. Spleen: Unremarkable. Adrenals/Urinary Tract: 1 mm nonobstructive calculi are noted within the lower pole collecting system of left kidney. In addition, at the left ureterovesicular junction (axial image 81 of series 2) there is a 2 mm calculus. This is associated with mild proximal hydroureter and moderate proximal hydronephrosis with perinephric stranding, indicating obstruction. No additional calculi are noted within the right renal collecting system, or along the course of the right ureter. Unenhanced appearance of the right kidney and bilateral adrenal glands is normal. Urinary bladder is nearly completely decompressed, but otherwise unremarkable in appearance. Stomach/Bowel: The unenhanced appearance of the stomach is normal. No pathologic dilatation  of small bowel or colon. Normal appendix. Vascular/Lymphatic: Aortic atherosclerosis. No lymphadenopathy noted in the abdomen or pelvis. Reproductive: Status post hysterectomy. Ovaries are not confidently identified may be surgically absent or atrophic. Other: No significant volume of ascites.  No pneumoperitoneum. Musculoskeletal: There are no aggressive appearing lytic or blastic lesions noted in the visualized portions of the skeleton. IMPRESSION: 1. 2 mm calculus at the left ureterovesicular junction with mild proximal left hydroureter and moderate left hydronephrosis as well as extensive left perinephric soft tissue stranding indicating obstruction. 2. Two tiny 1 mm calculi are noted in the lower pole collecting system of the left kidney. 3. Cardiomegaly with left ventricular dilatation. 4. Aortic atherosclerosis. Electronically Signed   By: Trudie Reed M.D.   On: 12/25/2021 07:30    Procedures Procedures    Medications Ordered in ED  Medications  sodium chloride 0.9 % bolus 1,000 mL (0 mLs Intravenous Stopped 12/25/21 0902)  morphine (PF) 4 MG/ML injection 4 mg (4 mg Intravenous Given 12/25/21 0753)  ondansetron (ZOFRAN) injection 4 mg (4 mg Intravenous Given 12/25/21 0753)  ketorolac (TORADOL) 30 MG/ML injection 30 mg (30 mg Intravenous Given 12/25/21 0908)    ED Course/ Medical Decision Making/ A&P                           Medical Decision Making Amount and/or Complexity of Data Reviewed Labs: ordered. Radiology: ordered.  Risk Prescription drug management.   This patient presents to the ED for concern of abdominal pain, this involves an extensive number of treatment options, and is a complaint that carries with it a high risk of complications and morbidity.  The differential diagnosis includes kidney stone, diverticulitis, infection, uti   Co morbidities that complicate the patient evaluation  htn   Additional history obtained:  Additional history obtained from epic chart  review    Lab Tests:  I Ordered, and personally interpreted labs.  The pertinent results include:  UA with rbcs, wbc slightly elevated at 12.9.  cmp with elevated glucose at 211   Imaging Studies ordered:  I ordered imaging studies including ct renal  I independently visualized and interpreted imaging which showed    IMPRESSION:  1. 2 mm calculus at the left ureterovesicular junction with mild  proximal left hydroureter and moderate left hydronephrosis as well  as extensive left perinephric soft tissue stranding indicating  obstruction.  2. Two tiny 1 mm calculi are noted in the lower pole collecting  system of the left kidney.  3. Cardiomegaly with left ventricular dilatation.  4. Aortic atherosclerosis.   I agree with the radiologist interpretation   Cardiac Monitoring:  The patient was maintained on a cardiac monitor.  I personally viewed and interpreted the cardiac monitored which showed an underlying rhythm of: nsr   Medicines ordered and prescription drug management:  I ordered medication including morphine/toradol  for pain, zofran for nausea, and IVFs  Reevaluation of the patient after these medicines showed that the patient improved I have reviewed the patients home medicines and have made adjustments as needed   Critical Interventions:  Pain control    Problem List / ED Course:  Abd pain:  due to kidney stone.  Pain is gone and pt is much more comfortable.  Pt to f/u with urology. BS elevated at 211.  She is to f/u with pcp to get that rechecked. Cardiomegaly with ventricular dilation:  pt referred to cards.  No cp or sob today.   Reevaluation:  After the interventions noted above, I reevaluated the patient and found that they have :improved   Social Determinants of Health:  Lives at home.   Dispostion:  After consideration of the diagnostic results and the patients response to treatment, I feel that the patent would benefit from discharge  with outpatient f/u.          Final Clinical Impression(s) / ED Diagnoses Final diagnoses:  Cardiomegaly  Kidney stone  Hyperglycemia    Rx / DC Orders ED Discharge Orders          Ordered    Ambulatory referral to Cardiology        12/25/21 0759    oxyCODONE-acetaminophen (PERCOCET/ROXICET) 5-325 MG tablet  Every 6 hours PRN        12/25/21 1005    tamsulosin (  FLOMAX) 0.4 MG CAPS capsule  Daily        12/25/21 1005    ondansetron (ZOFRAN-ODT) 4 MG disintegrating tablet  Every 8 hours PRN        12/25/21 1005              Jacalyn Lefevre, MD 12/25/21 1009

## 2022-07-29 ENCOUNTER — Emergency Department (HOSPITAL_COMMUNITY): Payer: Medicare Other

## 2022-07-29 ENCOUNTER — Inpatient Hospital Stay (HOSPITAL_COMMUNITY)
Admission: EM | Admit: 2022-07-29 | Discharge: 2022-08-04 | DRG: 871 | Disposition: A | Discharge status: Home Health | Payer: Medicare Other | Attending: Internal Medicine | Admitting: Internal Medicine

## 2022-07-29 ENCOUNTER — Inpatient Hospital Stay (HOSPITAL_COMMUNITY): Payer: Medicare Other

## 2022-07-29 DIAGNOSIS — Z90710 Acquired absence of both cervix and uterus: Secondary | ICD-10-CM

## 2022-07-29 DIAGNOSIS — E1165 Type 2 diabetes mellitus with hyperglycemia: Secondary | ICD-10-CM | POA: Diagnosis present

## 2022-07-29 DIAGNOSIS — E876 Hypokalemia: Secondary | ICD-10-CM | POA: Diagnosis present

## 2022-07-29 DIAGNOSIS — I447 Left bundle-branch block, unspecified: Secondary | ICD-10-CM | POA: Diagnosis present

## 2022-07-29 DIAGNOSIS — I5041 Acute combined systolic (congestive) and diastolic (congestive) heart failure: Secondary | ICD-10-CM | POA: Diagnosis present

## 2022-07-29 DIAGNOSIS — J189 Pneumonia, unspecified organism: Secondary | ICD-10-CM | POA: Diagnosis not present

## 2022-07-29 DIAGNOSIS — Z1152 Encounter for screening for COVID-19: Secondary | ICD-10-CM

## 2022-07-29 DIAGNOSIS — J9601 Acute respiratory failure with hypoxia: Secondary | ICD-10-CM

## 2022-07-29 DIAGNOSIS — Z7984 Long term (current) use of oral hypoglycemic drugs: Secondary | ICD-10-CM | POA: Diagnosis not present

## 2022-07-29 DIAGNOSIS — I428 Other cardiomyopathies: Secondary | ICD-10-CM | POA: Diagnosis present

## 2022-07-29 DIAGNOSIS — I700 Atherosclerosis of aorta: Secondary | ICD-10-CM | POA: Diagnosis present

## 2022-07-29 DIAGNOSIS — E1169 Type 2 diabetes mellitus with other specified complication: Secondary | ICD-10-CM | POA: Diagnosis present

## 2022-07-29 DIAGNOSIS — J90 Pleural effusion, not elsewhere classified: Secondary | ICD-10-CM | POA: Diagnosis not present

## 2022-07-29 DIAGNOSIS — Z79899 Other long term (current) drug therapy: Secondary | ICD-10-CM | POA: Diagnosis not present

## 2022-07-29 DIAGNOSIS — I10 Essential (primary) hypertension: Secondary | ICD-10-CM | POA: Diagnosis not present

## 2022-07-29 DIAGNOSIS — J181 Lobar pneumonia, unspecified organism: Secondary | ICD-10-CM | POA: Diagnosis present

## 2022-07-29 DIAGNOSIS — I4719 Other supraventricular tachycardia: Secondary | ICD-10-CM | POA: Diagnosis present

## 2022-07-29 DIAGNOSIS — I340 Nonrheumatic mitral (valve) insufficiency: Secondary | ICD-10-CM | POA: Diagnosis present

## 2022-07-29 DIAGNOSIS — L89816 Pressure-induced deep tissue damage of head: Secondary | ICD-10-CM | POA: Diagnosis not present

## 2022-07-29 DIAGNOSIS — E785 Hyperlipidemia, unspecified: Secondary | ICD-10-CM | POA: Diagnosis present

## 2022-07-29 DIAGNOSIS — J690 Pneumonitis due to inhalation of food and vomit: Secondary | ICD-10-CM | POA: Diagnosis not present

## 2022-07-29 DIAGNOSIS — I5021 Acute systolic (congestive) heart failure: Secondary | ICD-10-CM

## 2022-07-29 DIAGNOSIS — R7989 Other specified abnormal findings of blood chemistry: Secondary | ICD-10-CM | POA: Diagnosis not present

## 2022-07-29 DIAGNOSIS — E872 Acidosis, unspecified: Secondary | ICD-10-CM | POA: Diagnosis present

## 2022-07-29 DIAGNOSIS — A419 Sepsis, unspecified organism: Principal | ICD-10-CM | POA: Insufficient documentation

## 2022-07-29 DIAGNOSIS — Z8249 Family history of ischemic heart disease and other diseases of the circulatory system: Secondary | ICD-10-CM

## 2022-07-29 DIAGNOSIS — R739 Hyperglycemia, unspecified: Secondary | ICD-10-CM

## 2022-07-29 DIAGNOSIS — K529 Noninfective gastroenteritis and colitis, unspecified: Secondary | ICD-10-CM | POA: Diagnosis present

## 2022-07-29 DIAGNOSIS — R6521 Severe sepsis with septic shock: Secondary | ICD-10-CM | POA: Diagnosis present

## 2022-07-29 DIAGNOSIS — I2489 Other forms of acute ischemic heart disease: Secondary | ICD-10-CM | POA: Diagnosis present

## 2022-07-29 DIAGNOSIS — Z823 Family history of stroke: Secondary | ICD-10-CM

## 2022-07-29 DIAGNOSIS — I110 Hypertensive heart disease with heart failure: Secondary | ICD-10-CM | POA: Diagnosis present

## 2022-07-29 DIAGNOSIS — J9602 Acute respiratory failure with hypercapnia: Secondary | ICD-10-CM | POA: Diagnosis present

## 2022-07-29 DIAGNOSIS — I161 Hypertensive emergency: Secondary | ICD-10-CM | POA: Diagnosis present

## 2022-07-29 LAB — COMPREHENSIVE METABOLIC PANEL
ALT: 33 U/L (ref 0–44)
AST: 36 U/L (ref 15–41)
Albumin: 4.1 g/dL (ref 3.5–5.0)
Alkaline Phosphatase: 87 U/L (ref 38–126)
Anion gap: 12 (ref 5–15)
BUN: 16 mg/dL (ref 8–23)
CO2: 20 mmol/L — ABNORMAL LOW (ref 22–32)
Calcium: 9.4 mg/dL (ref 8.9–10.3)
Chloride: 109 mmol/L (ref 98–111)
Creatinine, Ser: 0.94 mg/dL (ref 0.44–1.00)
GFR, Estimated: >60 mL/min (ref >60)
Glucose, Bld: 253 mg/dL — ABNORMAL HIGH (ref 70–99)
Potassium: 3.6 mmol/L (ref 3.5–5.1)
Sodium: 141 mmol/L (ref 135–145)
Total Bilirubin: 0.7 mg/dL (ref 0.3–1.2)
Total Protein: 7.7 g/dL (ref 6.5–8.1)

## 2022-07-29 LAB — CBC WITH DIFFERENTIAL/PLATELET
Abs Immature Granulocytes: 0 10*3/uL (ref 0.00–0.07)
Basophils Absolute: 0.3 10*3/uL — ABNORMAL HIGH (ref 0.0–0.1)
Basophils Relative: 1 %
Eosinophils Absolute: 0.3 10*3/uL (ref 0.0–0.5)
Eosinophils Relative: 1 %
HCT: 43.7 % (ref 36.0–46.0)
Hemoglobin: 14.1 g/dL (ref 12.0–15.0)
Lymphocytes Relative: 32 %
Lymphs Abs: 8 10*3/uL — ABNORMAL HIGH (ref 0.7–4.0)
MCH: 28.7 pg (ref 26.0–34.0)
MCHC: 32.3 g/dL (ref 30.0–36.0)
MCV: 89 fL (ref 80.0–100.0)
Monocytes Absolute: 2.3 10*3/uL — ABNORMAL HIGH (ref 0.1–1.0)
Monocytes Relative: 9 %
Neutro Abs: 14.3 10*3/uL — ABNORMAL HIGH (ref 1.7–7.7)
Neutrophils Relative %: 57 %
Platelets: 254 10*3/uL (ref 150–400)
RBC: 4.91 MIL/uL (ref 3.87–5.11)
RDW: 13.2 % (ref 11.5–15.5)
WBC: 25 10*3/uL — ABNORMAL HIGH (ref 4.0–10.5)
nRBC: 0 % (ref 0.0–0.2)

## 2022-07-29 LAB — STREP PNEUMONIAE URINARY ANTIGEN: Strep Pneumo Urinary Antigen: NEGATIVE

## 2022-07-29 LAB — URINALYSIS, COMPLETE (UACMP) WITH MICROSCOPIC
Bacteria, UA: NONE SEEN
Bilirubin Urine: NEGATIVE
Glucose, UA: >=500 mg/dL — AB
Hgb urine dipstick: NEGATIVE
Ketones, ur: NEGATIVE mg/dL
Leukocytes,Ua: NEGATIVE
Nitrite: NEGATIVE
Protein, ur: 100 mg/dL — AB
Specific Gravity, Urine: 1.027 (ref 1.005–1.030)
pH: 6 (ref 5.0–8.0)

## 2022-07-29 LAB — PROCALCITONIN: Procalcitonin: 0.11 ng/mL

## 2022-07-29 LAB — ECHOCARDIOGRAM COMPLETE
Area-P 1/2: 5.5 cm2
Calc EF: 12 %
Height: 64 in
S' Lateral: 4.5 cm
Single Plane A2C EF: 13 %
Single Plane A4C EF: 10.5 %
Weight: 2331.58 oz

## 2022-07-29 LAB — BLOOD GAS, ARTERIAL
Acid-base deficit: 5.2 mmol/L — ABNORMAL HIGH (ref 0.0–2.0)
Bicarbonate: 22.7 mmol/L (ref 20.0–28.0)
Drawn by: 38235
FIO2: 100 %
O2 Saturation: 98.2 %
Patient temperature: 37
pCO2 arterial: 53 mmHg — ABNORMAL HIGH (ref 32–48)
pH, Arterial: 7.24 — ABNORMAL LOW (ref 7.35–7.45)
pO2, Arterial: 100 mmHg (ref 83–108)

## 2022-07-29 LAB — GLUCOSE, CAPILLARY
Glucose-Capillary: 76 mg/dL (ref 70–99)
Glucose-Capillary: 103 mg/dL — ABNORMAL HIGH (ref 70–99)
Glucose-Capillary: 126 mg/dL — ABNORMAL HIGH (ref 70–99)

## 2022-07-29 LAB — BLOOD GAS, VENOUS
Acid-base deficit: 9 mmol/L — ABNORMAL HIGH (ref 0.0–2.0)
Bicarbonate: 22.1 mmol/L (ref 20.0–28.0)
Drawn by: 61579
O2 Saturation: 33.1 %
Patient temperature: 35.1
pCO2, Ven: 67 mmHg — ABNORMAL HIGH (ref 44–60)
pH, Ven: 7.11 — CL (ref 7.25–7.43)
pO2, Ven: <31 mmHg — CL (ref 32–45)

## 2022-07-29 LAB — I-STAT CHEM 8, ED
BUN: 16 mg/dL (ref 8–23)
Calcium, Ion: 1.3 mmol/L (ref 1.15–1.40)
Chloride: 109 mmol/L (ref 98–111)
Creatinine, Ser: 0.9 mg/dL (ref 0.44–1.00)
Glucose, Bld: 248 mg/dL — ABNORMAL HIGH (ref 70–99)
HCT: 42 % (ref 36.0–46.0)
Hemoglobin: 14.3 g/dL (ref 12.0–15.0)
Potassium: 3.7 mmol/L (ref 3.5–5.1)
Sodium: 143 mmol/L (ref 135–145)
TCO2: 24 mmol/L (ref 22–32)

## 2022-07-29 LAB — TROPONIN I (HIGH SENSITIVITY)
Troponin I (High Sensitivity): 17 ng/L (ref <18)
Troponin I (High Sensitivity): 122 ng/L (ref <18)
Troponin I (High Sensitivity): 128 ng/L (ref <18)
Troponin I (High Sensitivity): 263 ng/L (ref <18)

## 2022-07-29 LAB — MRSA NEXT GEN BY PCR, NASAL: MRSA by PCR Next Gen: NOT DETECTED

## 2022-07-29 LAB — SARS CORONAVIRUS 2 BY RT PCR: SARS Coronavirus 2 by RT PCR: NEGATIVE

## 2022-07-29 LAB — MAGNESIUM: Magnesium: 2 mg/dL (ref 1.7–2.4)

## 2022-07-29 LAB — HEMOGLOBIN A1C
Hgb A1c MFr Bld: 6.3 % — ABNORMAL HIGH (ref 4.8–5.6)
Mean Plasma Glucose: 134.11 mg/dL

## 2022-07-29 LAB — PHOSPHORUS: Phosphorus: 3.7 mg/dL (ref 2.5–4.6)

## 2022-07-29 LAB — LACTIC ACID, PLASMA
Lactic Acid, Venous: 3 mmol/L (ref 0.5–1.9)
Lactic Acid, Venous: 2.3 mmol/L (ref 0.5–1.9)

## 2022-07-29 LAB — BRAIN NATRIURETIC PEPTIDE: B Natriuretic Peptide: 1157 pg/mL — ABNORMAL HIGH (ref 0.0–100.0)

## 2022-07-29 LAB — HIV ANTIBODY (ROUTINE TESTING W REFLEX): HIV Screen 4th Generation wRfx: NONREACTIVE

## 2022-07-29 MED ORDER — DEXTROSE 5 % IV SOLN: INTRAVENOUS | Status: DC

## 2022-07-29 MED ORDER — PROPOFOL 1000 MG/100ML IV EMUL
0.0000 ug/kg/min | INTRAVENOUS | Status: DC
  Administered 2022-07-29 (×2): 60 ug/kg/min via INTRAVENOUS
  Administered 2022-07-29: 65 ug/kg/min via INTRAVENOUS
  Administered 2022-07-30 (×3): 60.01 ug/kg/min via INTRAVENOUS
  Administered 2022-07-31: 20 ug/kg/min via INTRAVENOUS
  Filled 2022-07-29 (×10): qty 100

## 2022-07-29 MED ORDER — IOHEXOL 350 MG/ML SOLN
100.0000 mL | Freq: Once | INTRAVENOUS | Status: AC | PRN
  Administered 2022-07-29: 100 mL via INTRAVENOUS

## 2022-07-29 MED ORDER — PIPERACILLIN-TAZOBACTAM 3.375 G IVPB
3.3750 g | Freq: Once | INTRAVENOUS | Status: AC
  Administered 2022-07-29: 3.375 g via INTRAVENOUS
  Filled 2022-07-29: qty 50

## 2022-07-29 MED ORDER — VANCOMYCIN HCL 1250 MG/250ML IV SOLN
1250.0000 mg | INTRAVENOUS | Status: DC
  Administered 2022-07-30: 1250 mg via INTRAVENOUS
  Filled 2022-07-29: qty 250

## 2022-07-29 MED ORDER — VANCOMYCIN HCL IN DEXTROSE 1-5 GM/200ML-% IV SOLN
1000.0000 mg | Freq: Once | INTRAVENOUS | Status: DC
  Filled 2022-07-29: qty 200

## 2022-07-29 MED ORDER — LACTATED RINGERS IV BOLUS: 1000.0000 mL | Freq: Once | INTRAVENOUS | Status: DC

## 2022-07-29 MED ORDER — FENTANYL CITRATE (PF) 100 MCG/2ML IJ SOLN
INTRAMUSCULAR | Status: AC
  Filled 2022-07-29: qty 2

## 2022-07-29 MED ORDER — ACETAMINOPHEN 325 MG PO TABS
650.0000 mg | ORAL_TABLET | Freq: Four times a day (QID) | ORAL | Status: DC | PRN
  Administered 2022-07-29 – 2022-07-30 (×2): 650 mg via ORAL
  Filled 2022-07-29 (×2): qty 2

## 2022-07-29 MED ORDER — SODIUM CHLORIDE 0.9 % IV SOLN: INTRAVENOUS | Status: DC

## 2022-07-29 MED ORDER — ATORVASTATIN CALCIUM 40 MG PO TABS
40.0000 mg | ORAL_TABLET | Freq: Every day | ORAL | Status: DC
  Administered 2022-07-29 – 2022-08-04 (×7): 40 mg
  Filled 2022-07-29 (×7): qty 1

## 2022-07-29 MED ORDER — ETOMIDATE 2 MG/ML IV SOLN
INTRAVENOUS | Status: AC
  Administered 2022-07-29: 20 mg
  Filled 2022-07-29: qty 20

## 2022-07-29 MED ORDER — SODIUM CHLORIDE 0.9 % IV SOLN
500.0000 mg | INTRAVENOUS | Status: DC
  Administered 2022-07-29 – 2022-08-02 (×5): 500 mg via INTRAVENOUS
  Filled 2022-07-29 (×5): qty 5

## 2022-07-29 MED ORDER — SUCCINYLCHOLINE CHLORIDE 200 MG/10ML IV SOSY
PREFILLED_SYRINGE | INTRAVENOUS | Status: AC
  Administered 2022-07-29: 150 mg
  Filled 2022-07-29: qty 10

## 2022-07-29 MED ORDER — ORAL CARE MOUTH RINSE
15.0000 mL | OROMUCOSAL | Status: DC
  Administered 2022-07-29 – 2022-07-31 (×24): 15 mL via OROMUCOSAL

## 2022-07-29 MED ORDER — METOPROLOL TARTRATE 25 MG PO TABS
25.0000 mg | ORAL_TABLET | Freq: Four times a day (QID) | ORAL | Status: DC
  Administered 2022-07-29 – 2022-07-31 (×8): 25 mg
  Filled 2022-07-29 (×8): qty 1

## 2022-07-29 MED ORDER — PROPOFOL 1000 MG/100ML IV EMUL: 5.0000 ug/kg/min | INTRAVENOUS | Status: DC

## 2022-07-29 MED ORDER — PERFLUTREN LIPID MICROSPHERE
1.0000 mL | INTRAVENOUS | Status: AC | PRN
  Administered 2022-07-29: 4 mL via INTRAVENOUS

## 2022-07-29 MED ORDER — FENTANYL CITRATE PF 50 MCG/ML IJ SOSY
PREFILLED_SYRINGE | INTRAMUSCULAR | Status: AC
  Filled 2022-07-29: qty 2

## 2022-07-29 MED ORDER — PROPOFOL 1000 MG/100ML IV EMUL
INTRAVENOUS | Status: AC
  Filled 2022-07-29: qty 100

## 2022-07-29 MED ORDER — PIPERACILLIN-TAZOBACTAM 3.375 G IVPB
3.3750 g | Freq: Three times a day (TID) | INTRAVENOUS | Status: DC
  Administered 2022-07-29 – 2022-08-02 (×13): 3.375 g via INTRAVENOUS
  Filled 2022-07-29 (×14): qty 50

## 2022-07-29 MED ORDER — ENOXAPARIN SODIUM 40 MG/0.4ML IJ SOSY
40.0000 mg | PREFILLED_SYRINGE | INTRAMUSCULAR | Status: DC
  Administered 2022-07-29 – 2022-08-03 (×6): 40 mg via SUBCUTANEOUS
  Filled 2022-07-29 (×6): qty 0.4

## 2022-07-29 MED ORDER — PANTOPRAZOLE SODIUM 40 MG IV SOLR
40.0000 mg | INTRAVENOUS | Status: DC
  Administered 2022-07-29 – 2022-07-31 (×3): 40 mg via INTRAVENOUS
  Filled 2022-07-29 (×3): qty 10

## 2022-07-29 MED ORDER — CHLORHEXIDINE GLUCONATE CLOTH 2 % EX PADS
6.0000 | MEDICATED_PAD | Freq: Every day | CUTANEOUS | Status: DC
  Administered 2022-07-30 – 2022-08-01 (×3): 6 via TOPICAL

## 2022-07-29 MED ORDER — MIDAZOLAM HCL 2 MG/2ML IJ SOLN
INTRAMUSCULAR | Status: AC
  Filled 2022-07-29: qty 2

## 2022-07-29 MED ORDER — ORAL CARE MOUTH RINSE: 15.0000 mL | OROMUCOSAL | Status: DC | PRN

## 2022-07-29 MED ORDER — ATORVASTATIN CALCIUM 40 MG PO TABS: 40.0000 mg | ORAL_TABLET | Freq: Every day | ORAL | Status: DC

## 2022-07-29 MED ORDER — VANCOMYCIN HCL 1250 MG/250ML IV SOLN
1250.0000 mg | Freq: Once | INTRAVENOUS | Status: AC
  Administered 2022-07-29: 1250 mg via INTRAVENOUS
  Filled 2022-07-29: qty 250

## 2022-07-29 MED ORDER — ROCURONIUM BROMIDE 10 MG/ML (PF) SYRINGE
PREFILLED_SYRINGE | INTRAVENOUS | Status: AC
  Filled 2022-07-29: qty 10

## 2022-07-29 MED ORDER — LACTATED RINGERS IV BOLUS
1000.0000 mL | Freq: Once | INTRAVENOUS | Status: AC
  Administered 2022-07-29: 1000 mL via INTRAVENOUS

## 2022-07-29 NOTE — Assessment & Plan Note (Addendum)
-  Patient has been started on Farxiga -Follow CBGs fluctuation. -Modified carbohydrate diet and heart healthy discussed with patient. -Continue with statin therapy.  -Continue to follow patient's A1c level and further adjust treatment as needed for diabetes.

## 2022-07-29 NOTE — Progress Notes (Signed)
Retracted ET tube from 25cm to 23cm at the lip per MD Delo.

## 2022-07-29 NOTE — Progress Notes (Signed)
PROGRESS NOTE  Summer Christensen FFM:[id number] DOB: Jun 18, 1951 DOA: 07/29/2022 PCP: Vidal Schwalbe, MD  Brief History:  71 year old female with a history of hypertension, hyperlipidemia, left bundle branch block, migraine headache presenting with respiratory distress.  The patient is currently intubated and sedated.  History is obtained from speaking with the patient's son at the bedside and review of the medical record.  Apparently, the patient had been in her usual state of health up until the early morning 07/29/2022.  The patient woke up with shortness of breath.  She was brought to the emergency department by her son.  Upon arrival, the patient was noted to be obtunded in respiratory distress with saturation 48% on room air.  She was placed on nonrebreather with improvement to 64%.  She was subsequently intubated. Son and daughter at the bedside stated that the patient had been in her usual state of health without any complaints up until the time of admission.  She had not had any fevers, chills, headache, chest pain, shortness of breath, coughing, hemoptysis, nausea, vomiting. Notably, the patient has had chronic diarrhea for which she was seeing GI at [facility].  It was felt to be due to functional diarrhea versus IBS versus microscopic colitis with a component of biliary diarrhea.  She was last seen by GI at [facility] on 11/28/2019, and has been lost to follow-up since then.  There have been no reports of hematochezia or melena per the patient's family.  There is been no new medications.  The patient has not had any travels.  She has not had any sick contacts. In the ED, the patient was afebrile and hemodynamically stable.  She was intubated and sedated on the ventilator.  WBC 25.0, hemoglobin 14.1, platelets 254,000.  Sodium 141, potassium 3.6, CO2 20, serum creatinine 0.94.  AST 36, ALT 33, alk phosphatase 87, total bilirubin 0.7.  BNP 1157.  Troponin 17>> 128.  CT a chest was negative for pulmonary  embolus or dissection.  There was bilateral lower lobe consolidation, left greater than right with early upper lobe involvement.  There was superimposed perihilar and centrilobular pulmonary GGO with septal thickening.  CT of the abdomen and pelvis showed right renal cortical scarring.  There was retained stool in the transverse colon.  Otherwise there is no other acute intra-abdominal abnormalities.  The patient was started on vancomycin and Zosyn.    Assessment and Plan: * Sepsis due to undetermined organism ([place]) Secondary to pneumonia Presented with leukocytosis, tachycardia, respiratory distress CT chest as discussed above Check PCT Tracheal aspirate for culture and Gram stain Please note that the patient's blood cultures were obtained after Zosyn was given Continue empiric vancomycin and Zosyn MRSA screen Obtain UA and urine culture Wean to extubation  Acute respiratory failure with hypoxia and hypercarbia ([place]) Secondary to pneumonia with component of fluid overload Intubated in the ED PCCM consult Wean to extubation A.m. chest x-ray  Lobar pneumonia ([place]) CT chest as discussed above Check PCT Continue vancomycin and Zosyn Please note blood cultures were obtained after the patient was given Zosyn Add azithromycin Urine Legionella antigen Check COVID PCR Tracheal aspirate for culture MRSA screen  Elevated troponin Secondary to demand ischemia Personally reviewed EKG--sinus tachycardia, LBBB Echocardiogram Trend troponins  Elevated brain natriuretic peptide (BNP) level BNP 1157 Concerned about a component of fluid overload Echocardiogram Ultimately plan for diuresis after sepsis physiology improved  Controlled type 2 diabetes mellitus with hyperglycemia ([place]) 05/30/2022 hemoglobin  A1c 6.6 Patient is not on any agents in the outpatient setting NovoLog sliding scale  Essential hypertension Holding losartan temporarily due to soft blood pressures after  intubation         Family Communication:   son and daughter at bedside 10/10  Consultants:  PCCM  Code Status:  FULL /  DVT Prophylaxis: [place] Lovenox   Procedures: As Listed in Progress Note Above  Antibiotics: Vanc 1010>> Zosyn 10/10>> Azithro 10/10>>      Subjective: Patient is intubated and sedated.  Review of systems not possible.  Objective: Vitals:   07/29/22 0520 07/29/22 0700 07/29/22 0730 07/29/22 0738  BP:  101/68 111/72   Pulse:  (!) 104 99   Resp:  20 20   Temp:  98.8 F (37.1 C) 98.6 F (37 C)   SpO2:  98% 97% 98%  Weight: 66.1 kg     Height:        Intake/Output Summary (Last 24 hours) at 07/29/2022 0752 Last data filed at 07/29/2022 0641 Gross per 24 hour  Intake 22.19 ml  Output --  Net 22.19 ml   Weight change:  Exam:  General:  Pt is currently intubated and sedated. NAD HEENT: No icterus, No thrush, No neck mass, [place]/AT Cardiovascular: RRR, S1/S2, no rubs, no gallops Respiratory: Bibasilar rales.  No wheezing. Abdomen: Soft/+BS, non tender, non distended, no guarding Extremities: No edema, No lymphangitis, No petechiae, No rashes, no synovitis   Data Reviewed: I have personally reviewed following labs and imaging studies Basic Metabolic Panel: Recent Labs  Lab 07/29/22 0345 07/29/22 0351  NA 141 143  K 3.6 3.7  CL 109 109  CO2 20*  --   GLUCOSE 253* 248*  BUN 16 16  CREATININE 0.94 0.90  CALCIUM 9.4  --    Liver Function Tests: Recent Labs  Lab 07/29/22 0345  AST 36  ALT 33  ALKPHOS 87  BILITOT 0.7  PROT 7.7  ALBUMIN 4.1   No results for input(s): "LIPASE", "AMYLASE" in the last 168 hours. No results for input(s): "AMMONIA" in the last 168 hours. Coagulation Profile: No results for input(s): "INR", "PROTIME" in the last 168 hours. CBC: Recent Labs  Lab 07/29/22 0345 07/29/22 0351  WBC 25.0*  --   NEUTROABS 14.3*  --   HGB 14.1 14.3  HCT 43.7 42.0  MCV 89.0  --   PLT 254  --    Cardiac  Enzymes: No results for input(s): "CKTOTAL", "CKMB", "CKMBINDEX", "TROPONINI" in the last 168 hours. BNP: Invalid input(s): "POCBNP" CBG: No results for input(s): "GLUCAP" in the last 168 hours. HbA1C: No results for input(s): "HGBA1C" in the last 72 hours. Urine analysis:    Component Value Date/Time   COLORURINE YELLOW 12/25/2021 0720   APPEARANCEUR CLEAR 12/25/2021 0720   APPEARANCEUR Clear 11/20/2017 1200   LABSPEC 1.010 12/25/2021 0720   PHURINE 6.0 12/25/2021 0720   GLUCOSEU 50 (A) 12/25/2021 0720   HGBUR MODERATE (A) 12/25/2021 0720   BILIRUBINUR NEGATIVE 12/25/2021 0720   BILIRUBINUR Negative 11/20/2017 1200   KETONESUR NEGATIVE 12/25/2021 0720   PROTEINUR 30 (A) 12/25/2021 0720   UROBILINOGEN 0.2 11/14/2010 1850   NITRITE NEGATIVE 12/25/2021 0720   LEUKOCYTESUR SMALL (A) 12/25/2021 0720   Sepsis Labs: @LABRCNTIP (procalcitonin:4,lacticidven:4) )No results found for this or any previous visit (from the past 240 hour(s)).   Scheduled Meds: Continuous Infusions:  azithromycin     piperacillin-tazobactam (ZOSYN)  IV 3.375 g (07/29/22 0536)   piperacillin-tazobactam (ZOSYN)  IV  propofol (DIPRIVAN) infusion 70 mcg/kg/min (07/29/22 1610)   vancomycin     [START ON 07/30/2022] vancomycin      Procedures/Studies: DG Chest Portable 1 View  Result Date: 07/29/2022 CLINICAL DATA:  NG tube placement. EXAM: PORTABLE CHEST 1 VIEW COMPARISON:  07/29/2022 FINDINGS: 0451 hours. Endotracheal tube tip is 1.3 cm above the base of the carina. The NG tube passes into the stomach although the distal tip position is not included on the film. The cardio pericardial silhouette is enlarged. Vascular congestion noted with underlying chronic interstitial coarsening. Mild bibasilar atelectasis noted without substantial pleural effusion. Telemetry leads overlie the chest. IMPRESSION: Endotracheal tube tip is 1.3 cm above the base of the carina. NG tube passes into the stomach. Cardiomegaly  with vascular congestion and basilar atelectasis. Electronically Signed   By: Misty Stanley M.D.   On: 07/29/2022 05:30   CT Angio Chest/Abd/Pel for Dissection W and/or Wo Contrast  Addendum Date: 07/29/2022   ADDENDUM REPORT: 07/29/2022 05:09 ADDENDUM: ETT position and other salient findings discussed by telephone with Dr. Veryl Speak on 07/29/2022 at 0501 hours. Electronically Signed   By: Genevie Ann M.D.   On: 07/29/2022 05:09   Result Date: 07/29/2022 CLINICAL DATA:  71 year old female unresponsive at home. Altered mental status. Intubated. EXAM: CT ANGIOGRAPHY CHEST, ABDOMEN AND PELVIS TECHNIQUE: Non-contrast CT of the chest was initially obtained. Multidetector CT imaging through the chest, abdomen and pelvis was performed using the standard protocol during bolus administration of intravenous contrast. Multiplanar reconstructed images and MIPs were obtained and reviewed to evaluate the vascular anatomy. RADIATION DOSE REDUCTION: This exam was performed according to the departmental dose-optimization program which includes automated exposure control, adjustment of the mA and/or kV according to patient size and/or use of iterative reconstruction technique. CONTRAST:  163m OMNIPAQUE IOHEXOL 350 MG/ML SOLN COMPARISON:  Portable chest 0347 hours today. CTA chest 01/06/2011. CT Abdomen and Pelvis 12/25/2021. FINDINGS: CTA CHEST FINDINGS Cardiovascular: Calcified aortic atherosclerosis. Calcified coronary artery atherosclerosis. Mild cardiomegaly appears stable since March. No pericardial effusion. Following contrast there is no thoracic aorta dissection. Incidental cardiac pulsation artifact. No thoracic aortic aneurysm. Also, the central pulmonary arteries are enhancing and appear to be patent. Mediastinum/Nodes: Negative. Lungs/Pleura: Intubated, endotracheal tube tip terminates just at the right mainstem bronchus on series 10, image 72. Atelectatic changes to the airways at the carina. Dependent, confluent  pulmonary consolidation in both lower lobes, greater on the left. Early dependent upper lobe involvement. Small pulmonary arterial branches appear to be enhancing in the areas of consolidation. Superimposed perihilar and centrilobular pulmonary ground-glass opacity in both lungs with some septal thickening. Trace layering pleural effusions. Musculoskeletal: Respiratory motion at the sternum and ribs. No acute osseous abnormality identified. Review of the MIP images confirms the above findings. CTA ABDOMEN AND PELVIS FINDINGS VASCULAR Mild Aortoiliac calcified atherosclerosis. Negative for abdominal aortic aneurysm or dissection. Major arterial structures are patent. Portal venous system also appears to be patent. Review of the MIP images confirms the above findings. NON-VASCULAR Hepatobiliary: Chronically absent gallbladder. Liver appears stable and negative when allowing for motion artifact. Pancreas: Stable, negative. Spleen: Within normal limits. Adrenals/Urinary Tract: Stable, negative adrenal glands. Asymmetric right renal cortical scarring and volume loss. Symmetric renal enhancement. Motion artifact. No convincing hydronephrosis or pararenal inflammation. Ureters appear decompressed. No obvious urinary calculus. Unremarkable bladder. Stomach/Bowel: Intermittent motion artifact. Redundant but decompressed large bowel except for retained stool in the transverse colon. Normal retrocecal appendix on series 6, image 152. No dilated small bowel. Small volume  of air in fluid in the stomach. Negative duodenum. No free air, free fluid, or mesenteric inflammation identified. Lymphatic: No lymphadenopathy. Reproductive: Chronically absent uterus, diminutive or absent ovaries. Other: No pelvic free fluid. Musculoskeletal: No acute osseous abnormality identified. Review of the MIP images confirms the above findings. IMPRESSION: 1. Intubated, endotracheal tube tip terminates just inside the right mainstem bronchus.  Recommend retracting 1 cm. 2. Negative for aortic dissection or aneurysm. Aortic Atherosclerosis (ICD10-I70.0). 3. Positive for dependent consolidation in both lungs, especially lower lobes greater on the left. This is most compatible with Pneumonia Versus Aspiration. 4. Trace layering pleural effusions and additional central pulmonary ground-glass opacity with septal thickening raising the possibility of mild or developing interstitial edema. Stable cardiomegaly. No pericardial effusion. 5. No acute or inflammatory process identified in the abdomen or pelvis. Chronic right renal cortical scarring. Electronically Signed: By: Genevie Ann M.D. On: 07/29/2022 04:57   CT Head Wo Contrast  Result Date: 07/29/2022 CLINICAL DATA:  71 year old female unresponsive at home. Altered mental status. Intubated. EXAM: CT HEAD WITHOUT CONTRAST TECHNIQUE: Contiguous axial images were obtained from the base of the skull through the vertex without intravenous contrast. RADIATION DOSE REDUCTION: This exam was performed according to the departmental dose-optimization program which includes automated exposure control, adjustment of the mA and/or kV according to patient size and/or use of iterative reconstruction technique. COMPARISON:  Head CT 08/03/2009. FINDINGS: Brain: Cerebral volume is within normal limits for age. No midline shift, ventriculomegaly, mass effect, evidence of mass lesion, intracranial hemorrhage or evidence of cortically based acute infarction. Gray-white matter differentiation is within normal limits for age. No convincing encephalomalacia. Vascular: Calcified atherosclerosis at the skull base. No suspicious intracranial vascular hyperdensity. Skull: Stable.  No acute osseous abnormality identified. Sinuses/Orbits: Visualized paranasal sinuses and mastoids are stable and well aerated. Other: Visualized orbits and scalp soft tissues are within normal limits. IMPRESSION: Normal for age non contrast CT appearance of  the brain, head. Electronically Signed   By: Genevie Ann M.D.   On: 07/29/2022 04:48   DG Chest Portable 1 View  Result Date: 07/29/2022 CLINICAL DATA:  71 year old female with shortness of breath. EXAM: PORTABLE CHEST 1 VIEW COMPARISON:  Chest radiographs 12/31/2011 and earlier. CT Abdomen and Pelvis 12/25/2021. FINDINGS: Portable AP semi upright view at 0347 hours. Cardiomegaly, new since 2013 but appears stable from the CT Abdomen and Pelvis this year. Other mediastinal contours are within normal limits. Visualized tracheal air column is within normal limits. Lung volumes are within normal limits. No pneumothorax, pleural effusion or consolidation. Mild streaky bilateral perihilar opacity. Increased pulmonary vascularity from prior radiographs, but no overt edema. No acute osseous abnormality identified. Paucity of bowel gas in the upper abdomen. IMPRESSION: 1. Cardiomegaly with vascular congestion but no overt edema. 2. Streaky bilateral perihilar opacity, nonspecific but favor atelectasis. Electronically Signed   By: Genevie Ann M.D.   On: 07/29/2022 04:00    Orson Eva, DO  [facility]  If 7PM-7AM, please contact night-coverage [email or web address] Password [email or web address] 07/29/2022, 7:52 AM   LOS: 0 days

## 2022-07-29 NOTE — Assessment & Plan Note (Addendum)
-  Procalcitonin adequately trending down -MRSA PCR negative; vancomycin discontinued -Continue Zosyn and Zithromax -Continue supportive care and follow culture results. -COVID and influenza PCR negative. -Continue the use of flutter valve and incentive spirometer -Continue to wean oxygen supplementation as tolerated -Patient successfully extubated on 07/31/2022 -Physical therapy evaluation requested.

## 2022-07-29 NOTE — Assessment & Plan Note (Addendum)
Resume low dose losartan and add carvedilol. Continue diuresis with furosemide, spironolactone and empagliflozin.

## 2022-07-29 NOTE — ED Notes (Signed)
Date and time results received: 07/29/22 0702 (use smartphrase ".now" to insert current time)  Test: lactic acid Critical Value: 3.0  Name of Provider Notified: Aquilla Hacker, MD

## 2022-07-29 NOTE — Assessment & Plan Note (Addendum)
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 -EKG without acute ischemic changes -2D echo demonstrating ejection fraction around 20% -Cardiology service consultation appreciated; feel patient experiencing nonischemic cardiomyopathy process in the setting of sepsis. -Continue Lasix and metoprolol; Jardiance has been initiated today as well (08/01/2022).. -Will follow further recommendations/GDMT adjustments by cardiology service.Marland KitchenMarland Kitchen

## 2022-07-29 NOTE — ED Notes (Signed)
0981.Marland KitchenMarland Kitchen20  of etomidate adm LAC 0356 150 of succ adm 0357 color change noted 25 @ the lip

## 2022-07-29 NOTE — Assessment & Plan Note (Addendum)
-  BNP 1157 -Vascular congestion and interstitial edema appreciated on chest images at time of admission. -Repeat chest x-ray demonstrated improvement in patient's vascular congestion.  Good urine output reported. 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 -Continue to follow daily weights/strict intake and output -Continue metoprolol and Lasix. -Patient started on Jardiance as well.

## 2022-07-29 NOTE — Assessment & Plan Note (Deleted)
-  Secondary to pneumonia with component of fluid overload -Successfully extubated on 07/31/2022 -Appreciate assistance and recommendation by pulmonology service -Continue current antibiotics -Continue treatment with Lasix, follow daily weights and low-sodium diet. -Continue as needed bronchodilators -Continue current antibiotics therapy (Zosyn and Zithromax); patient is still experiencing low-grade temperature.  -Vancomycin DC in the setting of negative MRSA PCR.

## 2022-07-29 NOTE — ED Notes (Signed)
Pt getting echocardiogram at this time  

## 2022-07-29 NOTE — TOC Progression Note (Signed)
  Transition of Care (TOC) Screening Note   Patient Details  Name: Summer Christensen Date of Birth: Nov 15, 1950   [facility] CM/SW Contact:    Boneta Lucks, RN Phone Number: 07/29/2022, 9:48 AM  IN ED on Vent  Transition of Care Department [facility]) has reviewed patient and no TOC needs have been identified at this time. We will continue to monitor patient advancement through interdisciplinary progression rounds. If new patient transition needs arise, please place a TOC consult.    Expected Discharge Plan: Home/Self Care Barriers to Discharge: Continued Medical Work up  Expected Discharge Plan and Services Expected Discharge Plan: Home/Self Care

## 2022-07-29 NOTE — Progress Notes (Signed)
Patient transported from ED to ICU 7 without any complications.

## 2022-07-29 NOTE — H&P (Signed)
History and Physical    Patient: Summer Christensen NID:[id number] DOB: 09-08-1951 DOA: 07/29/2022 DOS: the patient was seen and examined on 07/29/2022 PCP: Vidal Schwalbe, MD  Patient coming from: Home  Chief Complaint:  Chief Complaint  Patient presents with   Shortness of Breath   HPI: Summer Christensen is a 71 y.o. female with medical history significant of hypertension, hyperlipidemia who presents to the emergency department by private vehicle due to shortness of breath.  Patient was already intubated and sedated, so she was unable to provide history, history was provided dated by son.  Per son, patient woke up in the course of the night complaining of not being able to breathe and asked him to take her to the ER.  By the time patient arrived at the ER, she was altered, obtunded and was in severe respiratory distress.  O2 sat was noted to be 40% on arrival to the ED.  ED Course:  In the emergency department, she was tachycardic, tachypneic and BP was 200/108, temperature was 98.74F and O2 sat was 95% after being intubated.  Work-up in the ED showed normal CBC except for WBC of 25.  BMP was normal except for bicarb of 20 and blood glucose of 253.  Troponin x1 was 17.  VBG done on arrival showed pH of 7.11 with PCO2 of 67, patient was intubated and sedated and subsequent ABG showed an improvement with a pH of 7.24, PCO2 of 53 and PO2 of 100. CT angiography chest, abdomen and pelvis was suggestive of pneumonia versus aspiration.  Trace layering pleural effusions and additional central pulmonary groundglass opacity with septal thickening raising the possibility of mild or develop interstitial edema.  Stable cardiomegaly.  No pericardial effusion Patient was started on IV Vanco and Zosyn  Review of Systems: Review of systems as noted in the HPI. All other systems reviewed and are negative.   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   ROTATOR CUFF REPAIR  2009   arthroscopic   TUBAL LIGATION     bilateral    Social History:  reports that she has never smoked. She has never used smokeless tobacco. She reports that she does not drink alcohol and does not use drugs.   No Known Allergies  Family History  Problem Relation Age of Onset   Stroke Mother    Heart attack Father      Prior to Admission medications   Medication Sig Start Date End Date Taking? Authorizing Provider  atorvastatin (LIPITOR) 40 MG tablet Take 40 mg by mouth daily. 12/12/21   [provider]  lisinopril (PRINIVIL,ZESTRIL) 20 MG tablet Take 20 mg by mouth daily.  08/12/17   [provider]  losartan (COZAAR) 25 MG tablet Take 25 mg by mouth daily. 12/12/21   [provider]  naproxen (NAPROSYN) 375 MG tablet Take 1 tablet (375 mg total) by mouth 2 (two) times daily. Patient not taking: Reported on 12/25/2021 02/05/20   Couture, [employer], PA-C  ondansetron (ZOFRAN-ODT) 4 MG disintegrating tablet Take 1 tablet (4 mg total) by mouth every 8 (eight) hours as needed for nausea or vomiting. 12/25/21   Isla Pence, MD  oxyCODONE-acetaminophen (PERCOCET/ROXICET) 5-325 MG tablet Take 1 tablet by mouth every 6 (six) hours as needed for severe pain. 12/25/21   Isla Pence, MD  tamsulosin (FLOMAX) 0.4 MG CAPS capsule Take 1 capsule (0.4 mg total) by mouth daily. 12/25/21   Isla Pence, MD    Physical Exam: BP 101/68   Pulse (!) 104   Temp 98.8 F (37.1 C)   Resp 20   Ht 5' 4"  (1.626 m)   Wt 66.1 kg   SpO2 98%   BMI 25.01 kg/m   General: 71 y.o. year-old female  intubated and sedated, but in no acute distress.   HEENT: NCAT, PERRL Neck: Supple, trachea medial Cardiovascular: Tachycardia.  Regular rate and rhythm with no rubs or gallops.  No thyromegaly.  No lower extremity edema. 2/4 pulses in all 4 extremities. Respiratory: Tachypnea.  Clear to auscultation with no wheezes or rales. Good inspiratory effort. Abdomen: Soft, nontender nondistended with normal bowel sounds x4 quadrants. Muskuloskeletal: No cyanosis, clubbing or edema noted bilaterally Neuro: This cannot be obtained at this time due to patient being intubated and sedated Skin: No ulcerative lesions noted or rashes Psychiatry: This cannot be obtained at this time due to patient being intubated and sedated         Labs on Admission:  Basic Metabolic Panel: Recent Labs  Lab 07/29/22 0345 07/29/22 0351  NA 141 143  K 3.6 3.7  CL 109 109  CO2 20*  --   GLUCOSE 253* 248*  BUN 16 16  CREATININE 0.94 0.90  CALCIUM 9.4  --    Liver Function Tests: Recent Labs  Lab 07/29/22 0345  AST 36  ALT 33  ALKPHOS 87  BILITOT 0.7  PROT 7.7  ALBUMIN 4.1   No results for input(s): "LIPASE", "AMYLASE" in the last 168 hours. No results for input(s): "AMMONIA" in the last 168 hours. CBC: Recent Labs  Lab 07/29/22 0345 07/29/22 0351  WBC 25.0*  --   NEUTROABS 14.3*  --   HGB 14.1 14.3  HCT 43.7 42.0  MCV 89.0  --   PLT 254  --    Cardiac Enzymes: No results for input(s): "CKTOTAL", "CKMB", "CKMBINDEX", "TROPONINI" in the last 168 hours.  BNP (last 3 results) Recent Labs    07/29/22 0345  BNP 1,157.0*    ProBNP (last 3 results) No results for input(s): "PROBNP" in the last 8760 hours.  CBG: No results for input(s): "GLUCAP" in the last 168 hours.  Radiological Exams on Admission: DG Chest Portable 1 View  Result Date: 07/29/2022 CLINICAL DATA:  NG tube placement. EXAM: PORTABLE CHEST 1 VIEW COMPARISON:  07/29/2022 FINDINGS: 0451 hours. Endotracheal tube tip is 1.3 cm  above the base of the carina. The NG tube passes into the stomach although the distal tip position is not included on the film. The cardio pericardial silhouette is enlarged. Vascular congestion noted with underlying chronic interstitial coarsening. Mild bibasilar atelectasis noted without substantial pleural effusion. Telemetry leads overlie the chest. IMPRESSION: Endotracheal tube tip is 1.3 cm above the base of the carina. NG tube passes into the stomach. Cardiomegaly with vascular congestion and basilar atelectasis. Electronically Signed   By: Misty Stanley M.D.   On: 07/29/2022 05:30   CT Angio Chest/Abd/Pel for Dissection W and/or Wo Contrast  Addendum Date: 07/29/2022  ADDENDUM REPORT: 07/29/2022 05:09 ADDENDUM: ETT position and other salient findings discussed by telephone with Dr. Veryl Speak on 07/29/2022 at 0501 hours. Electronically Signed   By: Genevie Ann M.D.   On: 07/29/2022 05:09   Result Date: 07/29/2022 CLINICAL DATA:  71 year old female unresponsive at home. Altered mental status. Intubated. EXAM: CT ANGIOGRAPHY CHEST, ABDOMEN AND PELVIS TECHNIQUE: Non-contrast CT of the chest was initially obtained. Multidetector CT imaging through the chest, abdomen and pelvis was performed using the standard protocol during bolus administration of intravenous contrast. Multiplanar reconstructed images and MIPs were obtained and reviewed to evaluate the vascular anatomy. RADIATION DOSE REDUCTION: This exam was performed according to the departmental dose-optimization program which includes automated exposure control, adjustment of the mA and/or kV according to patient size and/or use of iterative reconstruction technique. CONTRAST:  127m OMNIPAQUE IOHEXOL 350 MG/ML SOLN COMPARISON:  Portable chest 0347 hours today. CTA chest 01/06/2011. CT Abdomen and Pelvis 12/25/2021. FINDINGS: CTA CHEST FINDINGS Cardiovascular: Calcified aortic atherosclerosis. Calcified coronary artery atherosclerosis. Mild  cardiomegaly appears stable since March. No pericardial effusion. Following contrast there is no thoracic aorta dissection. Incidental cardiac pulsation artifact. No thoracic aortic aneurysm. Also, the central pulmonary arteries are enhancing and appear to be patent. Mediastinum/Nodes: Negative. Lungs/Pleura: Intubated, endotracheal tube tip terminates just at the right mainstem bronchus on series 10, image 72. Atelectatic changes to the airways at the carina. Dependent, confluent pulmonary consolidation in both lower lobes, greater on the left. Early dependent upper lobe involvement. Small pulmonary arterial branches appear to be enhancing in the areas of consolidation. Superimposed perihilar and centrilobular pulmonary ground-glass opacity in both lungs with some septal thickening. Trace layering pleural effusions. Musculoskeletal: Respiratory motion at the sternum and ribs. No acute osseous abnormality identified. Review of the MIP images confirms the above findings. CTA ABDOMEN AND PELVIS FINDINGS VASCULAR Mild Aortoiliac calcified atherosclerosis. Negative for abdominal aortic aneurysm or dissection. Major arterial structures are patent. Portal venous system also appears to be patent. Review of the MIP images confirms the above findings. NON-VASCULAR Hepatobiliary: Chronically absent gallbladder. Liver appears stable and negative when allowing for motion artifact. Pancreas: Stable, negative. Spleen: Within normal limits. Adrenals/Urinary Tract: Stable, negative adrenal glands. Asymmetric right renal cortical scarring and volume loss. Symmetric renal enhancement. Motion artifact. No convincing hydronephrosis or pararenal inflammation. Ureters appear decompressed. No obvious urinary calculus. Unremarkable bladder. Stomach/Bowel: Intermittent motion artifact. Redundant but decompressed large bowel except for retained stool in the transverse colon. Normal retrocecal appendix on series 6, image 152. No dilated small  bowel. Small volume of air in fluid in the stomach. Negative duodenum. No free air, free fluid, or mesenteric inflammation identified. Lymphatic: No lymphadenopathy. Reproductive: Chronically absent uterus, diminutive or absent ovaries. Other: No pelvic free fluid. Musculoskeletal: No acute osseous abnormality identified. Review of the MIP images confirms the above findings. IMPRESSION: 1. Intubated, endotracheal tube tip terminates just inside the right mainstem bronchus. Recommend retracting 1 cm. 2. Negative for aortic dissection or aneurysm. Aortic Atherosclerosis (ICD10-I70.0). 3. Positive for dependent consolidation in both lungs, especially lower lobes greater on the left. This is most compatible with Pneumonia Versus Aspiration. 4. Trace layering pleural effusions and additional central pulmonary ground-glass opacity with septal thickening raising the possibility of mild or developing interstitial edema. Stable cardiomegaly. No pericardial effusion. 5. No acute or inflammatory process identified in the abdomen or pelvis. Chronic right renal cortical scarring. Electronically Signed: By: HGenevie AnnM.D. On: 07/29/2022 04:57   CT Head Wo Contrast  Result Date: 07/29/2022  CLINICAL DATA:  71 year old female unresponsive at home. Altered mental status. Intubated. EXAM: CT HEAD WITHOUT CONTRAST TECHNIQUE: Contiguous axial images were obtained from the base of the skull through the vertex without intravenous contrast. RADIATION DOSE REDUCTION: This exam was performed according to the departmental dose-optimization program which includes automated exposure control, adjustment of the mA and/or kV according to patient size and/or use of iterative reconstruction technique. COMPARISON:  Head CT 08/03/2009. FINDINGS: Brain: Cerebral volume is within normal limits for age. No midline shift, ventriculomegaly, mass effect, evidence of mass lesion, intracranial hemorrhage or evidence of cortically based acute infarction.  Gray-white matter differentiation is within normal limits for age. No convincing encephalomalacia. Vascular: Calcified atherosclerosis at the skull base. No suspicious intracranial vascular hyperdensity. Skull: Stable.  No acute osseous abnormality identified. Sinuses/Orbits: Visualized paranasal sinuses and mastoids are stable and well aerated. Other: Visualized orbits and scalp soft tissues are within normal limits. IMPRESSION: Normal for age non contrast CT appearance of the brain, head. Electronically Signed   By: Genevie Ann M.D.   On: 07/29/2022 04:48   DG Chest Portable 1 View  Result Date: 07/29/2022 CLINICAL DATA:  71 year old female with shortness of breath. EXAM: PORTABLE CHEST 1 VIEW COMPARISON:  Chest radiographs 12/31/2011 and earlier. CT Abdomen and Pelvis 12/25/2021. FINDINGS: Portable AP semi upright view at 0347 hours. Cardiomegaly, new since 2013 but appears stable from the CT Abdomen and Pelvis this year. Other mediastinal contours are within normal limits. Visualized tracheal air column is within normal limits. Lung volumes are within normal limits. No pneumothorax, pleural effusion or consolidation. Mild streaky bilateral perihilar opacity. Increased pulmonary vascularity from prior radiographs, but no overt edema. No acute osseous abnormality identified. Paucity of bowel gas in the upper abdomen. IMPRESSION: 1. Cardiomegaly with vascular congestion but no overt edema. 2. Streaky bilateral perihilar opacity, nonspecific but favor atelectasis. Electronically Signed   By: Genevie Ann M.D.   On: 07/29/2022 04:00    EKG: I independently viewed the EKG done and my findings are as followed: Sinus tachycardia at a rate of 117 bpm   Assessment/Plan Present on Admission:  Acute respiratory failure with hypoxia ([place])  Principal Problem:   Acute respiratory failure with hypoxia ([place]) Active Problems:   Essential hypertension   Sepsis ([place])   Aspiration pneumonia ([place])   Hypertensive emergency    Hyperglycemia   Pleural effusion   Elevated brain natriuretic peptide (BNP) level  Acute respiratory failure with hypoxia possibly secondary to sepsis due to CAP vs aspiration pneumonia Patient was intubated and sedated Patient met sepsis criteria due to having leukocytosis, tachycardic, tachypneic and along pain source of infection. Patient was started on vancomycin and Zosyn, we shall continue same at this time with plan to de-escalate/discontinue based on blood culture, sputum culture, urine Legionella, strep pneumo and procalcitonin Continue Tylenol as needed Consider starting patient on incentive spirometry, flutter valve after extubation and patient is able to cooperate  Lactic acidosis Lactic acid 3.0, continue management as described above and continue to trend lactic acid  Bilateral pleural effusion  Elevated BNP (1,157) Trace layering pleural effusion noted bilaterally on CT scan of the chest Consider IV Lasix when BP improves Echocardiogram will be done Consider cardiology consult based on echo findings  Hypertensive emergency-resolved Essential hypertension BP meds will be held at this time due to BP being currently soft  Chronic hyperglycemia Patient has no history of T2DM Hemoglobin A1c will be checked  Hyperlipidemia Continue statin  DVT prophylaxis: Lovenox  Code  Status: Full code  Consults: PCCM  Family Communication: Family at bedside (all questions answered to satisfaction)  Severity of Illness: The appropriate patient status for this patient is INPATIENT. Inpatient status is judged to be reasonable and necessary in order to provide the required intensity of service to ensure the patient's safety. The patient's presenting symptoms, physical exam findings, and initial radiographic and laboratory data in the context of their chronic comorbidities is felt to place them at high risk for further clinical deterioration. Furthermore, it is not anticipated that  the patient will be medically stable for discharge from the hospital within 2 midnights of admission.   * I certify that at the point of admission it is my clinical judgment that the patient will require inpatient hospital care spanning beyond 2 midnights from the point of admission due to high intensity of service, high risk for further deterioration and high frequency of surveillance required.*  Author: Bernadette Hoit, DO 07/29/2022 7:24 AM  For on call review [email or web address].

## 2022-07-29 NOTE — Progress Notes (Signed)
Echocardiogram 2D Echocardiogram has been performed.  Oneal Deputy Wiletta Bermingham RDCS 07/29/2022, 9:26 AM

## 2022-07-29 NOTE — Progress Notes (Signed)
Pharmacy Antibiotic Note  Summer Christensen is a 71 y.o. female admitted on 07/29/2022 with SOB/PNA.  Pharmacy has been consulted for Vancomycin and Zosyn  dosing.  Plan: Vancomycin 1250 mg IV q24h Zosyn 3.375 g IV q8h   Height: 5\' 4"  (162.6 cm) Weight: 66.1 kg (145 lb 11.6 oz) IBW/kg (Calculated) : 54.7  Temp (24hrs), Avg:98.6 F (37 C), Min:98.6 F (37 C), Max:98.6 F (37 C)  Recent Labs  Lab 07/29/22 0345 07/29/22 0351  WBC 25.0*  --   CREATININE 0.94 0.90    Estimated Creatinine Clearance: 54.4 mL/min (by C-G formula based on SCr of 0.9 mg/dL).    No Known Allergies    Caryl Pina 07/29/2022 6:48 AM

## 2022-07-29 NOTE — ED Provider Notes (Addendum)
[facility] EMERGENCY DEPARTMENT Provider Note   CSN: [id number] Arrival date & time: 07/29/22  0326     History  Chief Complaint  Patient presents with   Shortness of Breath    Summer Christensen is a 71 y.o. female.  Patient is a 71 year old female with past medical history of hypertension, depression, anxiety.  Patient brought by private auto by her son for evaluation of shortness of breath.  According to the son, she woke up in the night stating that she could not breathe and asked him to transport her to the ER.  Patient arrives here obtunded, altered, and in severe respiratory distress.  Oxygen saturations were in the 40s upon presentation  The history is provided by the patient.       Home Medications Prior to Admission medications   Medication Sig Start Date End Date Taking? Authorizing Provider  atorvastatin (LIPITOR) 40 MG tablet Take 40 mg by mouth daily. 12/12/21   [provider]  lisinopril (PRINIVIL,ZESTRIL) 20 MG tablet Take 20 mg by mouth daily.  08/12/17   [provider]  losartan (COZAAR) 25 MG tablet Take 25 mg by mouth daily. 12/12/21   [provider]  naproxen (NAPROSYN) 375 MG tablet Take 1 tablet (375 mg total) by mouth 2 (two) times daily. Patient not taking: Reported on 12/25/2021 02/05/20   Couture, Cortni S, PA-C  ondansetron (ZOFRAN-ODT) 4 MG disintegrating tablet Take 1 tablet (4 mg total) by mouth every 8 (eight) hours as needed for nausea or vomiting. 12/25/21   Isla Pence, MD  oxyCODONE-acetaminophen (PERCOCET/ROXICET) 5-325 MG tablet Take 1 tablet by mouth every 6 (six) hours as needed for severe pain. 12/25/21   Isla Pence, MD  tamsulosin (FLOMAX) 0.4 MG CAPS capsule Take 1 capsule (0.4 mg total) by mouth daily. 12/25/21   Isla Pence, MD      Allergies    Patient has no known allergies.    Review of Systems   Review of Systems  Unable to perform ROS: Acuity of condition    Physical Exam Updated Vital  Signs BP (!) 200/108   Pulse (!) 118   Resp (!) 23   SpO2 (!) 86%  Physical Exam Vitals and nursing note reviewed.  Constitutional:      General: She is not in acute distress.    Appearance: She is not diaphoretic.     Comments: Patient arrives here with altered mental status.  She appears confused and flailing her arms.  She is not responding appropriately to questions.  She appears acutely ill.  HENT:     Head: Normocephalic and atraumatic.  Cardiovascular:     Rate and Rhythm: Normal rate and regular rhythm.     Heart sounds: No murmur heard.    No friction rub. No gallop.  Pulmonary:     Effort: Pulmonary effort is normal. No respiratory distress.     Breath sounds: Normal breath sounds. No wheezing.  Abdominal:     General: Bowel sounds are normal. There is no distension.     Palpations: Abdomen is soft.     Tenderness: There is no abdominal tenderness.  Musculoskeletal:        General: Normal range of motion.     Cervical back: Normal range of motion and neck supple.     Right lower leg: No tenderness. No edema.     Left lower leg: No edema.  Skin:    General: Skin is warm and dry.  Neurological:  Comments: Patient arrives here altered and confused.  She is staring off to the right and has occasional flailing of the arms.  She is not responding to questions or commands.     ED Results / Procedures / Treatments   Labs (all labs ordered are listed, but only abnormal results are displayed) Labs Reviewed  BLOOD GAS, VENOUS - Abnormal; Notable for the following components:      Result Value   pH, Ven 7.11 (*)    pCO2, Ven 67 (*)    pO2, Ven <31 (*)    Acid-base deficit 9.0 (*)    All other components within normal limits  I-STAT CHEM 8, ED - Abnormal; Notable for the following components:   Glucose, Bld 248 (*)    All other components within normal limits  COMPREHENSIVE METABOLIC PANEL  CBC WITH DIFFERENTIAL/PLATELET  LACTIC ACID, PLASMA  LACTIC ACID, PLASMA   BRAIN NATRIURETIC PEPTIDE  TROPONIN I (HIGH SENSITIVITY)    EKG None  Radiology No results found.  Procedures Procedure Name: Intubation Date/Time: 07/29/2022 6:14 AM  Performed by: Veryl Speak, MDPre-anesthesia Checklist: Patient identified, Patient being monitored, Emergency Drugs available, Timeout performed and Suction available Oxygen Delivery Method: Non-rebreather mask Preoxygenation: Pre-oxygenation with 100% oxygen Induction Type: Rapid sequence Ventilation: Mask ventilation without difficulty Laryngoscope Size: 3 and Mac Grade View: Grade I Tube size: 7.5 mm Number of attempts: 1 Placement Confirmation: ETT inserted through vocal cords under direct vision, CO2 detector and Breath sounds checked- equal and bilateral Tube secured with: ETT holder Dental Injury: Teeth and Oropharynx as per pre-operative assessment         Medications Ordered in ED Medications  fentaNYL (SUBLIMAZE) 100 MCG/2ML injection (has no administration in time range)  rocuronium bromide 100 MG/10ML SOSY (has no administration in time range)  succinylcholine (ANECTINE) 200 MG/10ML syringe (has no administration in time range)  fentaNYL (SUBLIMAZE) 50 MCG/ML injection (has no administration in time range)  etomidate (AMIDATE) 2 MG/ML injection (has no administration in time range)  midazolam (VERSED) 2 MG/2ML injection (has no administration in time range)  propofol (DIPRIVAN) 1000 MG/100ML infusion (  New Bag/Given 07/29/22 0400)    ED Course/ Medical Decision Making/ A&P  Patient is a 71 year old female with past medical history of hypertension brought by her son for evaluation of difficulty breathing.  She woke in the night complaining of severe shortness of breath.  She was transported here by private auto and arrived with oxygen saturations of 40% and encephalopathic.  Patient arrived here with profound hypoxia and was markedly hypertensive.  On exam, there were audible rales in the  bases and patient was in significant respiratory distress.  Patient was placed on a nonrebreather with some improvement in her oxygen saturations, but was not cooperating well with the mask.  She was confused and flailing.  Patient was then attempted on BiPAP, however was unable to tolerate this as well.  At this point, I decided to proceed with intubation.  RSI was performed using etomidate and succinylcholine.  The glide scope was used and a #3 blade easily visualized the cords and a 7.5 endotracheal tube was placed.  Tube placement confirmed with direct visualization, end-tidal CO2, and auscultation over the stomach and lungs.  As patient was markedly hypertensive, I elected to obtain a dissection study to rule out this as a cause of her dyspnea, hypertension, and mental status change.  This study was performed and showed no evidence for this.  It did show that the  endotracheal tube was too deep and this was retracted.  The CT also showed evidence for bibasilar pneumonia possibly related to aspiration.  Patient was given broad-spectrum antibiotics after blood cultures were obtained.  Remainder of the work-up shows white count of 25,000, but laboratory studies are otherwise unremarkable.  Family updated on the patient's condition.  I have spoken with the hospitalist, Dr. Josephine Cables who will evaluate the patient and admit.  CRITICAL CARE Performed by: Veryl Speak Total critical care time: 50 minutes Critical care time was exclusive of separately billable procedures and treating other patients. Critical care was necessary to treat or prevent imminent or life-threatening deterioration. Critical care was time spent personally by me on the following activities: development of treatment plan with patient and/or surrogate as well as nursing, discussions with consultants, evaluation of patient's response to treatment, examination of patient, obtaining history from patient or surrogate, ordering and performing  treatments and interventions, ordering and review of laboratory studies, ordering and review of radiographic studies, pulse oximetry and re-evaluation of patient's condition.   Final Clinical Impression(s) / ED Diagnoses Final diagnoses:  None    Rx / DC Orders ED Discharge Orders     None         Veryl Speak, MD 07/29/22 0768    Veryl Speak, MD 07/29/22 [phone]

## 2022-07-29 NOTE — ED Triage Notes (Signed)
Pov from home. Cc of SOB. 48% on room air Clammy and diaphoretic . 64% on NRB

## 2022-07-29 NOTE — Hospital Course (Addendum)
71 year old female with a history of hypertension, hyperlipidemia, left bundle branch block, migraine headache presenting with respiratory distress.     History is obtained from speaking with the patient's son at the bedside and review of the medical record.  Apparently, the patient had been in her usual state of health up until the early morning 07/29/2022.  The patient woke up with shortness of breath.  She was brought to the emergency department by her son.  Upon arrival, the patient was noted to be obtunded in respiratory distress with saturation 48% on room air.  She was placed on nonrebreather with improvement to 64%.  She was subsequently intubated. Son and daughter at the bedside stated that the patient had been in her usual state of health without any complaints up until the time of admission.  She had not had any fevers, chills, headache, chest pain, shortness of breath, coughing, hemoptysis, nausea, vomiting.  In the ED, the patient was afebrile and hemodynamically stable.  She was intubated and sedated on the ventilator.  WBC 25.0, hemoglobin 14.1, platelets 254,000.  Sodium 141, potassium 3.6, CO2 20, serum creatinine 0.94.  AST 36, ALT 33, alk phosphatase 87, total bilirubin 0.7.  BNP 1157.  Troponin 17>> 128.  CT a chest was negative for pulmonary embolus or dissection.    There was bilateral lower lobe consolidation, left greater than right with early upper lobe involvement.  There was superimposed perihilar and centrilobular pulmonary GGO with septal thickening.  CT of the abdomen and pelvis showed right renal cortical scarring.  There was retained stool in the transverse colon.  Otherwise there is no other acute intra-abdominal abnormalities.    The patient was started on vancomycin and Zosyn.  Patient was liberated from mechanical ventilation on 07/31/22.

## 2022-07-29 NOTE — Consult Note (Signed)
NAME:  Summer Christensen, MRN:  [id number], DOB:  [birth year], LOS: 0 ADMISSION DATE:  07/29/2022, CONSULTATION DATE:  07/29/22  REFERRING MD:  Tat, CHIEF COMPLAINT:  acute resp failure   History of Present Illness:  18 yobf with medical history significant of hypertension on ACEi, hyperlipidemia who presented to the emergency department early am 10/10   by private vehicle due to shortness of breath.  Patient was already intubated and sedated, so she was unable to provide history, history was provided dated by son.  Per son, patient woke up in the course of the night complaining of not being able to breathe and asked him to take her to the ER.  By the time patient arrived at the ER, she was altered, obtunded and was in severe respiratory distress.  O2 sat was noted to be 40% on arrival to the ED.   ED Course:  In the emergency department, she was tachycardic, tachypneic and BP was 200/108, temperature was 98.42F and O2 sat was 95% after being intubated.  Work-up in the ED showed normal CBC except for WBC of 25.  BNP  1157 BMP was normal except for bicarb of 20 and blood glucose of 253.  Troponin x1 was 17.  VBG done on arrival showed pH of 7.11 with PCO2 of 67, patient was intubated and sedated and subsequent ABG showed an improvement with a pH of 7.24, PCO2 of 53 and PO2 of 100. CT angiography chest, abdomen and pelvis was suggestive of pneumonia versus aspiration.  Trace layering pleural effusions and additional central pulmonary groundglass opacity with septal thickening raising the possibility of mild or develop interstitial edema.  Stable cardiomegaly.  No pericardial effusion Patient was started on IV Vanco and Zosyn  Baseline per son Haywood Lasso :  fully independent/ ambulatory, did gardening and felt fine when went to bed 10/9  - ate Tolap[place] for supper, no gi c/o or cp's or cough. Was never a smoker or using inhalers      Significant Hospital Events: Including procedures, antibiotic start and stop  dates in addition to other pertinent events   ET 10/10 >>> Urine strep 10/10>>> Urine legionella 10/10 >>> Vanc/ zosyn /zmax  10/10 >>> Echo 10/10 hyperdyndamic  Sputum 10/10 >>> MRSA PCR 10/10 neg     Scheduled Meds:  [START ON 07/30/2022] Chlorhexidine Gluconate Cloth  6 each Topical Q0600   mouth rinse  15 mL Mouth Rinse Q2H   Continuous Infusions:  azithromycin Stopped (07/29/22 1115)   piperacillin-tazobactam (ZOSYN)  IV     propofol (DIPRIVAN) infusion 60 mcg/kg/min (07/29/22 1212)   [START ON 07/30/2022] vancomycin     PRN Meds:.acetaminophen, mouth rinse    Interim History / Subjective:  Sedated on diprovan not breathing above back up rate   Objective   Blood pressure 111/74, pulse 83, temperature 99 F (37.2 C), resp. rate 20, height 5\' 4"  (1.626 m), weight 70.4 kg, SpO2 100 %.    Vent Mode: PRVC FiO2 (%):  [75 %-100 %] 75 % Set Rate:  [20 bmp] 20 bmp Vt Set:  [440 mL] 440 mL PEEP:  [5 cmH20] 5 cmH20 Plateau Pressure:  [13 cmH20-22 cmH20] 15 cmH20   Intake/Output Summary (Last 24 hours) at 07/29/2022 1245 Last data filed at 07/29/2022 1229 Gross per 24 hour  Intake 22.19 ml  Output 1250 ml  Net -1227.81 ml   Filed Weights   07/29/22 0520 07/29/22 1240  Weight: 66.1 kg 70.4 kg    Examination: Tmax:  99 General appearance:    ederly bf / comfortable on vent    No jvd Oropharynx oral et  Neck supple Lungs with a few scattered exp > insp rhonchi bilaterally RRR no s3 or or sign murmur Abd obese with nl excursion  Extr warm with no edema or clubbing noted Neuro  Sensorium sedated,  no apparent motor deficits     I personally reviewed images and agree with radiology impression as follows:  CXR:   10/10 Endotracheal tube tip is 1.3 cm above the base of the carina but pulled back 2 cm to 23 at lip p cxr  NG tube passes into the stomach. Cardiomegaly with vascular congestion and basilar atelectasis.   Assessment & Plan:  1) Acute resp failure/  hypercarbic and hypoxemic in pt with hbp on ACEi and BNP > 1,000 in pt who went to bed apparently in baseline health strongly suggesive of ddx  chf diastolic dysfunction  with/without aspiration event overnnight >>> agree with present rx   >>> empirical diuresis/ abx as you paln   2)  probable  diastolic dysfunction based on elevated BNP despite vigorous LV on echo today  >>> r/o MI/ added lopressor 25 mg qid 10/10     3) possible CAP or asp pna > broach coverage adequate for now, consider early step down - PCT 0.11 not c/w sepsis but could still have "local" infection like Pna   Discussed with son and daughter, leave on vent/ sedate fo rnow   Best Practice (right click and "Reselect all SmartList Selections" daily)      Labs   CBC: Recent Labs  Lab 07/29/22 0345 07/29/22 0351  WBC 25.0*  --   NEUTROABS 14.3*  --   HGB 14.1 14.3  HCT 43.7 42.0  MCV 89.0  --   PLT 254  --     Basic Metabolic Panel: Recent Labs  Lab 07/29/22 0345 07/29/22 0351  NA 141 143  K 3.6 3.7  CL 109 109  CO2 20*  --   GLUCOSE 253* 248*  BUN 16 16  CREATININE 0.94 0.90  CALCIUM 9.4  --    GFR: Estimated Creatinine Clearance: 56 mL/min (by C-G formula based on SCr of 0.9 mg/dL). Recent Labs  Lab 07/29/22 0345 07/29/22 0623 07/29/22 0815  PROCALCITON  --  0.11  --   WBC 25.0*  --   --   LATICACIDVEN  --  3.0* 2.3*    Liver Function Tests: Recent Labs  Lab 07/29/22 0345  AST 36  ALT 33  ALKPHOS 87  BILITOT 0.7  PROT 7.7  ALBUMIN 4.1   No results for input(s): "LIPASE", "AMYLASE" in the last 168 hours. No results for input(s): "AMMONIA" in the last 168 hours.  ABG    Component Value Date/Time   PHART 7.24 (L) 07/29/2022 0535   PCO2ART 53 (H) 07/29/2022 0535   PO2ART 100 07/29/2022 0535   HCO3 22.7 07/29/2022 0535   TCO2 24 07/29/2022 0351   ACIDBASEDEF 5.2 (H) 07/29/2022 0535   O2SAT 98.2 07/29/2022 0535     Coagulation Profile: No results for input(s): "INR",  "PROTIME" in the last 168 hours.  Cardiac Enzymes: No results for input(s): "CKTOTAL", "CKMB", "CKMBINDEX", "TROPONINI" in the last 168 hours.  HbA1C: Hgb A1c MFr Bld  Date/Time Value Ref Range Status  07/29/2022 06:23 AM 6.3 (H) 4.8 - 5.6 % Final    Comment:    (NOTE) Pre diabetes:  5.7%-6.4%  Diabetes:              >6.4%  Glycemic control for   <7.0% adults with diabetes     CBG: No results for input(s): "GLUCAP" in the last 168 hours.     Past Medical History:  She,  has a past medical history of Bundle branch block left (10/2010 she), Chest pain, hysterectomy, Hypertension, and Migraine.   Surgical History:   Past Surgical History:  Procedure Laterality Date   ABDOMINAL HYSTERECTOMY     COLONOSCOPY N/A 06/11/2016   Procedure: COLONOSCOPY;  Surgeon: Malissa Hippo, MD;  Location: [facility];  Service: Endoscopy;  Laterality: N/A;  2:00 - moved to 1:00 - Ann notified pt   LAPAROSCOPIC CHOLECYSTECTOMY  2005   pain.   ROTATOR CUFF REPAIR  2009   arthroscopic   TUBAL LIGATION     bilateral     Social History:   reports that she has never smoked. She has never used smokeless tobacco. She reports that she does not drink alcohol and does not use drugs.   Family History:  Her family history includes Heart attack in her father; Stroke in her mother.   Allergies No Known Allergies   Home Medications  Prior to Admission medications   Medication Sig Start Date End Date Taking? Authorizing Provider  atorvastatin (LIPITOR) 40 MG tablet Take 40 mg by mouth daily. 12/12/21  Yes [provider]  dicyclomine (BENTYL) 20 MG tablet Take 20 mg by mouth 4 (four) times daily as needed for spasms. 06/24/22  Yes [provider]  lisinopril (PRINIVIL,ZESTRIL) 20 MG tablet Take 20 mg by mouth daily.  08/12/17  Yes [provider]  losartan (COZAAR) 25 MG tablet Take 25 mg by mouth daily. 12/12/21  Yes [provider]  Saccharomyces  boulardii (PROBIOTIC) 250 MG CAPS Take 1 capsule by mouth daily. 06/24/22  Yes [provider]  naproxen (NAPROSYN) 375 MG tablet Take 1 tablet (375 mg total) by mouth 2 (two) times daily. Patient not taking: Reported on 12/25/2021 02/05/20   Couture, Cortni S, PA-C  ondansetron (ZOFRAN-ODT) 4 MG disintegrating tablet Take 1 tablet (4 mg total) by mouth every 8 (eight) hours as needed for nausea or vomiting. Patient not taking: Reported on 07/29/2022 12/25/21   Jacalyn Lefevre, MD  oxyCODONE-acetaminophen (PERCOCET/ROXICET) 5-325 MG tablet Take 1 tablet by mouth every 6 (six) hours as needed for severe pain. Patient not taking: Reported on 07/29/2022 12/25/21   Jacalyn Lefevre, MD  tamsulosin (FLOMAX) 0.4 MG CAPS capsule Take 1 capsule (0.4 mg total) by mouth daily. Patient not taking: Reported on 07/29/2022 12/25/21   Jacalyn Lefevre, MD      The patient is critically ill with multiple organ systems failure and requires high complexity decision making for assessment and support, frequent evaluation and titration of therapies, application of advanced monitoring technologies and extensive interpretation of multiple databases. Critical Care Time devoted to patient care services described in this note is 45 minutes.   Sandrea Hughs, MD Pulmonary and Critical Care Medicine [facility] Cell [phone]   After 7:00 pm call Elink  [phone]

## 2022-07-29 NOTE — Progress Notes (Signed)
Patient transported to CT and back to ED room 14 on vent with no adverse events noted.

## 2022-07-29 NOTE — Assessment & Plan Note (Addendum)
-  Secondary to pneumonia -Sepsis present at time of admission with leukocytosis, tachycardia, respiratory distress -Procalcitonin 0.11>>3.19>>1.83; continue to follow trend.  -Follow culture result -Continue treatment with Zosyn and Zithromax  -Patient is still spiking low-grade fever -Continue as needed antipyretics and cooling blankets.   -Continue supportive care. -Fever curve continue trending down.

## 2022-07-30 ENCOUNTER — Inpatient Hospital Stay (HOSPITAL_COMMUNITY): Payer: Medicare Other

## 2022-07-30 DIAGNOSIS — A419 Sepsis, unspecified organism: Secondary | ICD-10-CM | POA: Diagnosis not present

## 2022-07-30 DIAGNOSIS — J690 Pneumonitis due to inhalation of food and vomit: Secondary | ICD-10-CM | POA: Diagnosis not present

## 2022-07-30 DIAGNOSIS — I5021 Acute systolic (congestive) heart failure: Secondary | ICD-10-CM | POA: Diagnosis not present

## 2022-07-30 DIAGNOSIS — E876 Hypokalemia: Secondary | ICD-10-CM

## 2022-07-30 DIAGNOSIS — I2489 Other forms of acute ischemic heart disease: Secondary | ICD-10-CM

## 2022-07-30 DIAGNOSIS — I447 Left bundle-branch block, unspecified: Secondary | ICD-10-CM

## 2022-07-30 DIAGNOSIS — J9601 Acute respiratory failure with hypoxia: Secondary | ICD-10-CM | POA: Diagnosis not present

## 2022-07-30 DIAGNOSIS — R7989 Other specified abnormal findings of blood chemistry: Secondary | ICD-10-CM | POA: Diagnosis not present

## 2022-07-30 DIAGNOSIS — E1165 Type 2 diabetes mellitus with hyperglycemia: Secondary | ICD-10-CM | POA: Diagnosis not present

## 2022-07-30 DIAGNOSIS — J9602 Acute respiratory failure with hypercapnia: Secondary | ICD-10-CM | POA: Diagnosis not present

## 2022-07-30 LAB — GLUCOSE, CAPILLARY
Glucose-Capillary: 113 mg/dL — ABNORMAL HIGH (ref 70–99)
Glucose-Capillary: 120 mg/dL — ABNORMAL HIGH (ref 70–99)
Glucose-Capillary: 135 mg/dL — ABNORMAL HIGH (ref 70–99)
Glucose-Capillary: 142 mg/dL — ABNORMAL HIGH (ref 70–99)
Glucose-Capillary: 143 mg/dL — ABNORMAL HIGH (ref 70–99)
Glucose-Capillary: 160 mg/dL — ABNORMAL HIGH (ref 70–99)

## 2022-07-30 LAB — BLOOD GAS, ARTERIAL
Acid-base deficit: 1.3 mmol/L (ref 0.0–2.0)
Bicarbonate: 22.7 mmol/L (ref 20.0–28.0)
Drawn by: 22223
FIO2: 40 %
O2 Saturation: 99 %
Patient temperature: 37
pCO2 arterial: 35 mmHg (ref 32–48)
pH, Arterial: 7.42 (ref 7.35–7.45)
pO2, Arterial: 100 mmHg (ref 83–108)

## 2022-07-30 LAB — MAGNESIUM: Magnesium: 1.9 mg/dL (ref 1.7–2.4)

## 2022-07-30 LAB — COMPREHENSIVE METABOLIC PANEL
ALT: 51 U/L — ABNORMAL HIGH (ref 0–44)
AST: 52 U/L — ABNORMAL HIGH (ref 15–41)
Albumin: 3.3 g/dL — ABNORMAL LOW (ref 3.5–5.0)
Alkaline Phosphatase: 71 U/L (ref 38–126)
Anion gap: 8 (ref 5–15)
BUN: 12 mg/dL (ref 8–23)
CO2: 24 mmol/L (ref 22–32)
Calcium: 8.9 mg/dL (ref 8.9–10.3)
Chloride: 110 mmol/L (ref 98–111)
Creatinine, Ser: 1.1 mg/dL — ABNORMAL HIGH (ref 0.44–1.00)
GFR, Estimated: 54 mL/min — ABNORMAL LOW (ref >60)
Glucose, Bld: 145 mg/dL — ABNORMAL HIGH (ref 70–99)
Potassium: 3.2 mmol/L — ABNORMAL LOW (ref 3.5–5.1)
Sodium: 142 mmol/L (ref 135–145)
Total Bilirubin: 0.9 mg/dL (ref 0.3–1.2)
Total Protein: 6 g/dL — ABNORMAL LOW (ref 6.5–8.1)

## 2022-07-30 LAB — TRIGLYCERIDES: Triglycerides: 151 mg/dL — ABNORMAL HIGH (ref <150)

## 2022-07-30 LAB — LEGIONELLA PNEUMOPHILA SEROGP 1 UR AG: L. pneumophila Serogp 1 Ur Ag: NEGATIVE

## 2022-07-30 LAB — CBC
HCT: 36.4 % (ref 36.0–46.0)
Hemoglobin: 11.9 g/dL — ABNORMAL LOW (ref 12.0–15.0)
MCH: 28.7 pg (ref 26.0–34.0)
MCHC: 32.7 g/dL (ref 30.0–36.0)
MCV: 87.9 fL (ref 80.0–100.0)
Platelets: 175 10*3/uL (ref 150–400)
RBC: 4.14 MIL/uL (ref 3.87–5.11)
RDW: 13.3 % (ref 11.5–15.5)
WBC: 14.6 10*3/uL — ABNORMAL HIGH (ref 4.0–10.5)
nRBC: 0 % (ref 0.0–0.2)

## 2022-07-30 LAB — PROCALCITONIN: Procalcitonin: 3.19 ng/mL

## 2022-07-30 MED ORDER — FENTANYL 2500MCG IN NS 250ML (10MCG/ML) PREMIX INFUSION
0.0000 ug/h | INTRAVENOUS | Status: DC
  Administered 2022-07-30: 100 ug/h via INTRAVENOUS
  Filled 2022-07-30: qty 250

## 2022-07-30 MED ORDER — FUROSEMIDE 10 MG/ML IJ SOLN
40.0000 mg | Freq: Two times a day (BID) | INTRAMUSCULAR | Status: DC
  Administered 2022-07-30 – 2022-07-31 (×3): 40 mg via INTRAVENOUS
  Filled 2022-07-30 (×3): qty 4

## 2022-07-30 MED ORDER — POTASSIUM CHLORIDE 20 MEQ PO PACK
20.0000 meq | PACK | ORAL | Status: AC
  Administered 2022-07-30 (×2): 20 meq
  Filled 2022-07-30 (×2): qty 1

## 2022-07-30 MED ORDER — ACETAMINOPHEN 325 MG PO TABS
650.0000 mg | ORAL_TABLET | Freq: Four times a day (QID) | ORAL | Status: DC | PRN
  Administered 2022-07-30 – 2022-07-31 (×2): 650 mg
  Filled 2022-07-30 (×2): qty 2

## 2022-07-30 MED ORDER — POTASSIUM CHLORIDE 10 MEQ/100ML IV SOLN
10.0000 meq | INTRAVENOUS | Status: AC
  Administered 2022-07-30 (×4): 10 meq via INTRAVENOUS
  Filled 2022-07-30 (×4): qty 100

## 2022-07-30 NOTE — Progress Notes (Signed)
NAME:  Summer Christensen, MRN:  [id number], DOB:  April 26, 1951, LOS: 1 ADMISSION DATE:  07/29/2022, CONSULTATION DATE:  07/29/22  REFERRING MD:  Tat, CHIEF COMPLAINT:  acute resp failure   History of Present Illness:  33 yobf with medical history significant of hypertension on ACEi, hyperlipidemia who presented to the emergency department early am 10/10   by private vehicle due to shortness of breath.  Patient was already intubated and sedated, so she was unable to provide history, history was provided dated by son.  Per son, patient woke up in the course of the night complaining of not being able to breathe and asked him to take her to the ER.  By the time patient arrived at the ER, she was altered, obtunded and was in severe respiratory distress.  O2 sat was noted to be 40% on arrival to the ED.   ED Course:  In the emergency department, she was tachycardic, tachypneic and BP was 200/108, temperature was 98.82F and O2 sat was 95% after being intubated.  Work-up in the ED showed normal CBC except for WBC of 25.  BNP  1157 BMP was normal except for bicarb of 20 and blood glucose of 253.  Troponin x1 was 17.  VBG done on arrival showed pH of 7.11 with PCO2 of 67, patient was intubated and sedated and subsequent ABG showed an improvement with a pH of 7.24, PCO2 of 53 and PO2 of 100. CT angiography chest, abdomen and pelvis was suggestive of pneumonia versus aspiration.  Trace layering pleural effusions and additional central pulmonary groundglass opacity with septal thickening raising the possibility of mild or develop interstitial edema.  Stable cardiomegaly.  No pericardial effusion Patient was started on IV Vanco and Zosyn  Baseline per son Verner Chol :  fully independent/ ambulatory, did gardening and felt fine when went to bed 10/9  - ate [place] for supper, no gi c/o or cp's or cough. Was never a smoker or using inhalers      Significant Hospital Events: Including procedures, antibiotic start and stop  dates in addition to other pertinent events   ET 10/10 >>> Urine strep 10/10>>> Urine legionella 10/10 >>> Vanc/ zosyn /zmax  10/10 >>> Echo 10/10  1. Left ventricular ejection fraction, by estimation, is <20%. The left  ventricle has severely decreased function. The left ventricle demonstrates global hypokinesis. The left ventricular internal cavity size was moderately dilated. Left ventricular  diastolic parameters are consistent with Grade III diastolic dysfunction   2. Right ventricular systolic function is normal.   3. The mitral valve is normal in structure. Mild to moderate mitral valve regurgitation.    4. The aortic valve is tricuspid. Aortic valve regurgitation is not  visualized. No aortic stenosis is present.   5. The inferior vena cava is normal in size with greater than 50%  respiratory variability, suggesting right atrial pressure of 3 mmHg.   -   MRSA PCR 10/10 neg    -   Sputum 10/11>>>       Scheduled Meds:  atorvastatin  40 mg Per Tube Daily   Chlorhexidine Gluconate Cloth  6 each Topical Q0600   enoxaparin (LOVENOX) injection  40 mg Subcutaneous Q24H   furosemide  40 mg Intravenous BID   metoprolol tartrate  25 mg Per Tube QID   mouth rinse  15 mL Mouth Rinse Q2H   pantoprazole (PROTONIX) IV  40 mg Intravenous Q24H   Continuous Infusions:  azithromycin 500 mg (07/30/22 0914)  dextrose 75 mL/hr at 07/30/22 0916   piperacillin-tazobactam (ZOSYN)  IV 12.5 mL/hr at 07/30/22 0917   propofol (DIPRIVAN) infusion 60.01 mcg/kg/min (07/30/22 1219)   PRN Meds:.acetaminophen, mouth rinse    Interim History / Subjective:  Sedated on proprofol but changing over to Fentanyl due to reduced EF   Objective   Blood pressure 130/77, pulse (!) 110, temperature (!) 101.3 F (38.5 C), resp. rate (!) 22, height 5\' 4"  (1.626 m), weight 70.4 kg, SpO2 99 %.    Vent Mode: PRVC FiO2 (%):  [40 %-70 %] 40 % Set Rate:  [20 bmp] 20 bmp Vt Set:  [440 mL] 440 mL PEEP:  [5 cmH20] 5  cmH20 Plateau Pressure:  [15 cmH20] 15 cmH20   Intake/Output Summary (Last 24 hours) at 07/30/2022 1229 Last data filed at 07/30/2022 1200 Gross per 24 hour  Intake 3601.23 ml  Output 2050 ml  Net 1551.23 ml   Filed Weights   07/29/22 0520 07/29/22 1240  Weight: 66.1 kg 70.4 kg    Examination: Tmax:  101.5  General appearance: elderly bf sedated on vent   No jvd Oropharynx clear,  mucosa nl Neck supple Lungs with a few scattered exp > insp rhonchi bilaterally RRR no s3 or or sign murmur Abd obese with limited  excursion  Extr warm with no edema or clubbing noted/ pas hose in place Neuro  Sensorium sedated ,  no apparent motor deficits     I personally reviewed images and agree with radiology impression as follows:  CXR:   portable 10/11  Persistent left lower lobe airspace disease vs atx vs effusoin (no air bronchograms to distinguish)   Decreased interstitial edema pattern.  Assessment & Plan:  1) Acute resp failure/ hypercarbic and hypoxemic in pt with hbp on ACEi and BNP > 1,000 in pt who went to bed apparently in baseline health strongly suggesive of ddx  chf   with/without aspiration event overnnight >>> agree with present rx   >>> empirical diuresis/ abx as you paln >>> see cards eval/rx    2)   Systolic LVD  - added lopressor 25 mg qid 10/10  - cards eval 10/11   3) possible CAP or asp pna > broad coverage adequate for now, consider early step down - PCT 0.11 not c/w sepsis but could still have "local" infection like Pna  - recheck PCT 10/11 >>>  Discussed with son at bedside, rest tonight and try wean in am 10/12   Best Practice (right click and "Reselect all SmartList Selections" daily)      Labs   CBC: Recent Labs  Lab 07/29/22 0345 07/29/22 0351 07/30/22 0434  WBC 25.0*  --  14.6*  NEUTROABS 14.3*  --   --   HGB 14.1 14.3 11.9*  HCT 43.7 42.0 36.4  MCV 89.0  --  87.9  PLT 254  --  175    Basic Metabolic Panel: Recent Labs  Lab  07/29/22 0345 07/29/22 0351 07/29/22 1327 07/30/22 0434  NA 141 143  --  142  K 3.6 3.7  --  3.2*  CL 109 109  --  110  CO2 20*  --   --  24  GLUCOSE 253* 248*  --  145*  BUN 16 16  --  12  CREATININE 0.94 0.90  --  1.10*  CALCIUM 9.4  --   --  8.9  MG  --   --  2.0 1.9  PHOS  --   --  3.7  --    GFR: Estimated Creatinine Clearance: 45.8 mL/min (A) (by C-G formula based on SCr of 1.1 mg/dL (H)). Recent Labs  Lab 07/29/22 0345 07/29/22 0623 07/29/22 0815 07/30/22 0434  PROCALCITON  --  0.11  --   --   WBC 25.0*  --   --  14.6*  LATICACIDVEN  --  3.0* 2.3*  --     Liver Function Tests: Recent Labs  Lab 07/29/22 0345 07/30/22 0434  AST 36 52*  ALT 33 51*  ALKPHOS 87 71  BILITOT 0.7 0.9  PROT 7.7 6.0*  ALBUMIN 4.1 3.3*   No results for input(s): "LIPASE", "AMYLASE" in the last 168 hours. No results for input(s): "AMMONIA" in the last 168 hours.  ABG    Component Value Date/Time   PHART 7.42 07/30/2022 0319   PCO2ART 35 07/30/2022 0319   PO2ART 100 07/30/2022 0319   HCO3 22.7 07/30/2022 0319   TCO2 24 07/29/2022 0351   ACIDBASEDEF 1.3 07/30/2022 0319   O2SAT 99 07/30/2022 0319     Coagulation Profile: No results for input(s): "INR", "PROTIME" in the last 168 hours.  Cardiac Enzymes: No results for input(s): "CKTOTAL", "CKMB", "CKMBINDEX", "TROPONINI" in the last 168 hours.  HbA1C: Hgb A1c MFr Bld  Date/Time Value Ref Range Status  07/29/2022 06:23 AM 6.3 (H) 4.8 - 5.6 % Final    Comment:    (NOTE) Pre diabetes:          5.7%-6.4%  Diabetes:              >6.4%  Glycemic control for   <7.0% adults with diabetes     CBG: Recent Labs  Lab 07/29/22 2018 07/30/22 0014 07/30/22 0442 07/30/22 0722 07/30/22 1207  GLUCAP 126* 113* 135* 120* 143*     The patient is critically ill with multiple organ systems failure and requires high complexity decision making for assessment and support, frequent evaluation and titration of therapies,  application of advanced monitoring technologies and extensive interpretation of multiple databases. Critical Care Time devoted to patient care services described in this note is 45  minutes.   [name], MD [facility] and [place] [phone]   After 7:00 pm call Elink  [phone]

## 2022-07-30 NOTE — Assessment & Plan Note (Addendum)
Renal function with serum cr at 1,0 with K at 3,8 and serum bicarbonate at 25. Continue diuresis, with loop diuretic, SGLT 2 and add spironolactone. Follow up renal function in am,

## 2022-07-30 NOTE — Consult Note (Signed)
CONSULTATION NOTE   Patient Name: Summer Christensen Date of Encounter: 07/30/2022 Cardiologist: None - Remotely saw Dr. Dietrich Pates Electrophysiologist: None Advanced Heart Failure: None   Chief Complaint   Intubated, sedated on vent  Patient Profile   71 yo female with history of HTN, HLD, LBBB, presented with respiratory failure requiring intubation with findings concerning for CHF - echo shows LVEF <20%  HPI   Summer Christensen is a 71 y.o. female who is being seen today for the evaluation of congestive heart failure at the request of Dr. Gwenlyn Perking.  This is a 71 year old female remotely seen by Dr. Dietrich Pates in 2012 for left bundle branch block and dyspnea.  She has a history of hypertension and dyslipidemia as well.  Echo in 2012 showed normal systolic function.  She now presents with acute hypoxic respiratory failure.  She apparently was unable to breathe at night and was brought into the ER noted to be obtunded and severe respiratory distress and intubated.  Oxygen saturations were 40% on arrival.  She was noted to have a marked leukocytosis of 25,000 with significant hypertension blood pressure 200/108 and elevated BNP of 1157.  Initial troponin was 17 and subsequently rose to 263.  Chest x-ray showed cardiomegaly with vascular congestion and bibasilar atelectasis.  Strep pneumo antigen negative, procalcitonin was low.  His lactate was elevated at 3 but has improved to 2.3.  She was started on broad-spectrum antibiotics.  Repeat echocardiogram yesterday demonstrated LVEF less than 20% with severe global hypokinesis moderate LV dilatation with restrictive diastolic dysfunction.  Mild to moderate MR and low right atrial pressure.  Prior to this she was functional and there was no description of any antecedent chest pain.  EKG personally reviewed shows left bundle branch block, LVH by voltage and a sinus tachycardia.  This appears similar to EKG performed in 2012.  Chest x-ray today shows persistent  left lower lobe airspace disease.  CT angiogram of the chest yesterday showed no PE or dissection, however aortic atherosclerosis and dependent consolidation in both lungs worse in the left compatible with pneumonia versus aspiration and layering pleural effusions as well as central pulmonary venous congestion, suggesting mild or developing interstitial edema was noted.  PMHx   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   Chest pain    Hx of hysterectomy    Hypertension    Migraine     Past Surgical History:  Procedure Laterality Date   ABDOMINAL HYSTERECTOMY     COLONOSCOPY N/A 06/11/2016   Procedure: COLONOSCOPY;  Surgeon: Malissa Hippo, MD;  Location: [facility];  Service: Endoscopy;  Laterality: N/A;  2:00 - moved to 1:00 - Ann notified pt   LAPAROSCOPIC CHOLECYSTECTOMY  2005   pain.   ROTATOR CUFF REPAIR  2009   arthroscopic   TUBAL LIGATION     bilateral    FAMHx   Family History  Problem Relation Age of Onset   Stroke Mother    Heart attack Father     SOCHx    reports that she has never smoked. She has never used smokeless tobacco. She reports that she does not drink alcohol and does not use drugs.  Outpatient Medications   No current facility-administered medications on file prior to encounter.   Current Outpatient Medications on File Prior to Encounter  Medication Sig Dispense Refill   atorvastatin (LIPITOR) 40 MG tablet Take 40 mg by mouth daily.  dicyclomine (BENTYL) 20 MG tablet Take 20 mg by mouth 4 (four) times daily as needed for spasms.     lisinopril (PRINIVIL,ZESTRIL) 20 MG tablet Take 20 mg by mouth daily.      losartan (COZAAR) 25 MG tablet Take 25 mg by mouth daily.     Saccharomyces boulardii (PROBIOTIC) 250 MG CAPS Take 1 capsule by mouth daily.     naproxen (NAPROSYN) 375 MG tablet Take 1 tablet (375 mg total) by mouth 2 (two) times daily. (Patient not taking: Reported on 12/25/2021) 20 tablet 0    ondansetron (ZOFRAN-ODT) 4 MG disintegrating tablet Take 1 tablet (4 mg total) by mouth every 8 (eight) hours as needed for nausea or vomiting. (Patient not taking: Reported on 07/29/2022) 20 tablet 0   oxyCODONE-acetaminophen (PERCOCET/ROXICET) 5-325 MG tablet Take 1 tablet by mouth every 6 (six) hours as needed for severe pain. (Patient not taking: Reported on 07/29/2022) 15 tablet 0   tamsulosin (FLOMAX) 0.4 MG CAPS capsule Take 1 capsule (0.4 mg total) by mouth daily. (Patient not taking: Reported on 07/29/2022) 14 capsule 0    Inpatient Medications    Scheduled Meds:  atorvastatin  40 mg Per Tube Daily   Chlorhexidine Gluconate Cloth  6 each Topical Q0600   enoxaparin (LOVENOX) injection  40 mg Subcutaneous Q24H   metoprolol tartrate  25 mg Per Tube QID   mouth rinse  15 mL Mouth Rinse Q2H   pantoprazole (PROTONIX) IV  40 mg Intravenous Q24H   potassium chloride  20 mEq Per Tube Q4H    Continuous Infusions:  azithromycin 500 mg (07/30/22 0914)   dextrose 75 mL/hr at 07/30/22 0916   piperacillin-tazobactam (ZOSYN)  IV 12.5 mL/hr at 07/30/22 0917   potassium chloride 10 mEq (07/30/22 1020)   propofol (DIPRIVAN) infusion 60.01 mcg/kg/min (07/30/22 0827)    PRN Meds: acetaminophen, mouth rinse   ALLERGIES   No Known Allergies  ROS   Review of systems not obtained due to patient factors.  Vitals   Vitals:   07/30/22 0724 07/30/22 0800 07/30/22 0900 07/30/22 1000  BP:  (!) 145/76 134/78 112/76  Pulse: 87 98 100 (!) 106  Resp: 20 20 (!) 21 (!) 22  Temp: (!) 100.6 F (38.1 C) (!) 100.8 F (38.2 C) (!) 100.9 F (38.3 C) (!) 101.5 F (38.6 C)  TempSrc: Bladder     SpO2: 100% 99% 100% 100%  Weight:      Height:   5\' 4"  (1.626 m)     Intake/Output Summary (Last 24 hours) at 07/30/2022 1039 Last data filed at 07/30/2022 0900 Gross per 24 hour  Intake 2738.84 ml  Output 2900 ml  Net -161.16 ml   Filed Weights   07/29/22 0520 07/29/22 1240  Weight: 66.1 kg 70.4 kg     Physical Exam   General appearance: intubated, sedated on vent, comfortable, breathing with the vent Neck: JVD - a few cm above sternal notch, no carotid bruit, and thyroid not enlarged, symmetric, no tenderness/mass/nodules Lungs: diminished breath sounds bibasilar Heart: regular rate and rhythm, S1, S2 normal, no murmur, click, rub or gallop Abdomen: soft, non-tender; bowel sounds normal; no masses,  no organomegaly Extremities: extremities normal, atraumatic, no cyanosis or edema Pulses: 2+ and symmetric Skin: Skin color, texture, turgor normal. No rashes or lesions or warm, dry extremities Neurologic: Mental status: sedated on vent Psych: Cannot assess  Labs   Results for orders placed or performed during the hospital encounter of 07/29/22 (from the past 48 hour(s))  Blood gas, venous (at [facility] and [facility], not at [facility])     Status: Abnormal   Collection Time: 07/29/22  3:43 AM  Result Value Ref Range   pH, Ven 7.11 (LL) 7.25 - 7.43    Comment: CRITICAL RESULT CALLED TO, READ BACK BY AND VERIFIED WITH: RUSH,C@0358  BY MATTHEWS, B 10.10.2023    pCO2, Ven 67 (H) 44 - 60 mmHg   pO2, Ven <31 (LL) 32 - 45 mmHg    Comment: CRITICAL RESULT CALLED TO, READ BACK BY AND VERIFIED WITH: RUSH,C@0358  BY MATTHEWS, B 10.10.2023    Bicarbonate 22.1 20.0 - 28.0 mmol/L   Acid-base deficit 9.0 (H) 0.0 - 2.0 mmol/L   O2 Saturation 33.1 %   Patient temperature 35.1    Collection site RIGHT ANTECUBITAL    Drawn by [phone]     Comment: Performed at [facility], [address], [place], [place] [address]  Comprehensive metabolic panel     Status: Abnormal   Collection Time: 07/29/22  3:45 AM  Result Value Ref Range   Sodium 141 135 - 145 mmol/L   Potassium 3.6 3.5 - 5.1 mmol/L   Chloride 109 98 - 111 mmol/L   CO2 20 (L) 22 - 32 mmol/L   Glucose, Bld 253 (H) 70 - 99 mg/dL    Comment: Glucose reference range applies only to samples taken after fasting for at least 8 hours.   BUN 16 8 - 23 mg/dL    Creatinine, Ser 0.98 0.44 - 1.00 mg/dL   Calcium 9.4 8.9 - 11.9 mg/dL   Total Protein 7.7 6.5 - 8.1 g/dL   Albumin 4.1 3.5 - 5.0 g/dL   AST 36 15 - 41 U/L   ALT 33 0 - 44 U/L   Alkaline Phosphatase 87 38 - 126 U/L   Total Bilirubin 0.7 0.3 - 1.2 mg/dL   GFR, Estimated >14 >78 mL/min    Comment: (NOTE) Calculated using the CKD-EPI Creatinine Equation (2021)    Anion gap 12 5 - 15    Comment: Performed at [facility], [address], [place], [place] [address]  CBC with Differential     Status: Abnormal   Collection Time: 07/29/22  3:45 AM  Result Value Ref Range   WBC 25.0 (H) 4.0 - 10.5 K/uL   RBC 4.91 3.87 - 5.11 MIL/uL   Hemoglobin 14.1 12.0 - 15.0 g/dL   HCT 13.0 86.5 - 78.4 %   MCV 89.0 80.0 - 100.0 fL   MCH 28.7 26.0 - 34.0 pg   MCHC 32.3 30.0 - 36.0 g/dL   RDW 69.6 29.5 - 28.4 %   Platelets 254 150 - 400 K/uL   nRBC 0.0 0.0 - 0.2 %   Neutrophils Relative % 57 %   Neutro Abs 14.3 (H) 1.7 - 7.7 K/uL   Lymphocytes Relative 32 %   Lymphs Abs 8.0 (H) 0.7 - 4.0 K/uL   Monocytes Relative 9 %   Monocytes Absolute 2.3 (H) 0.1 - 1.0 K/uL   Eosinophils Relative 1 %   Eosinophils Absolute 0.3 0.0 - 0.5 K/uL   Basophils Relative 1 %   Basophils Absolute 0.3 (H) 0.0 - 0.1 K/uL   WBC Morphology ABSOLUTE LYMPHOCYTOSIS    RBC Morphology MORPHOLOGY UNREMARKABLE    Abs Immature Granulocytes 0.00 0.00 - 0.07 K/uL    Comment: Performed at [facility], [address], [place], [place] [address]  Troponin I (High Sensitivity)     Status: None   Collection Time: 07/29/22  3:45 AM  Result Value Ref Range   Troponin I (High Sensitivity) 17 <18 ng/L    Comment: (NOTE) Elevated high sensitivity troponin I (hsTnI) values and significant  changes across serial measurements may suggest ACS but many other  chronic and acute conditions are known to elevate hsTnI results.  Refer to the "Links" section for chest pain algorithms and additional  guidance. Performed at [facility], [address] [place], [place] [address]   Brain natriuretic peptide     Status: Abnormal   Collection Time: 07/29/22  3:45 AM  Result Value Ref Range   B Natriuretic Peptide 1,157.0 (H) 0.0 - 100.0 pg/mL    Comment: Performed at [facility], [address], [place], [place] [address]  I-stat chem 8, ed     Status: Abnormal   Collection Time: 07/29/22  3:51 AM  Result Value Ref Range   Sodium 143 135 - 145 mmol/L   Potassium 3.7 3.5 - 5.1 mmol/L   Chloride 109 98 - 111 mmol/L   BUN 16 8 - 23 mg/dL   Creatinine, Ser 2.95 0.44 - 1.00 mg/dL   Glucose, Bld 284 (H) 70 - 99 mg/dL    Comment: Glucose reference range applies only to samples taken after fasting for at least 8 hours.   Calcium, Ion 1.30 1.15 - 1.40 mmol/L   TCO2 24 22 - 32 mmol/L   Hemoglobin 14.3 12.0 - 15.0 g/dL   HCT 13.2 44.0 - 10.2 %  Blood gas, arterial     Status: Abnormal   Collection Time: 07/29/22  5:35 AM  Result Value Ref Range   FIO2 100.00 %   pH, Arterial 7.24 (L) 7.35 - 7.45   pCO2 arterial 53 (H) 32 - 48 mmHg   pO2, Arterial 100 83 - 108 mmHg   Bicarbonate 22.7 20.0 - 28.0 mmol/L   Acid-base deficit 5.2 (H) 0.0 - 2.0 mmol/L   O2 Saturation 98.2 %   Patient temperature 37.0    Collection site RIGHT RADIAL    Drawn by [id number]    Allens test (pass/fail) PASS PASS    Comment: Performed at [facility], [address], [place], [place] [address]  Blood culture (routine x 2)     Status: None (Preliminary result)   Collection Time: 07/29/22  6:17 AM   Specimen: BLOOD RIGHT ARM  Result Value Ref Range   Specimen Description BLOOD RIGHT ARM    Special Requests      BOTTLES DRAWN AEROBIC AND ANAEROBIC Blood Culture adequate volume   Culture      NO GROWTH < 24 HOURS Performed at [facility], [address], [place], [place] [address]    Report Status PENDING   Troponin I (High Sensitivity)     Status: Abnormal   Collection Time: 07/29/22  6:20 AM  Result Value Ref Range   Troponin I (High Sensitivity) 122 (HH) <18  ng/L    Comment: CRITICAL VALUE NOTED.  VALUE IS CONSISTENT WITH PREVIOUSLY REPORTED AND CALLED VALUE. (NOTE) Elevated high sensitivity troponin I (hsTnI) values and significant  changes across serial measurements may suggest ACS but many other  chronic and acute conditions are known to elevate hsTnI results.  Refer to the Links section for chest pain algorithms and additional  guidance. Performed at [facility], [address], [place], [place] [address]   Lactic acid, plasma     Status: Abnormal   Collection Time: 07/29/22  6:23 AM  Result Value Ref Range  Lactic Acid, Venous 3.0 (HH) 0.5 - 1.9 mmol/L    Comment: CRITICAL RESULT CALLED TO, READ BACK BY AND VERIFIED WITH: TEAGSLEY,B@0659  BY MATTHEWS, B 10.10.2023  Performed at [facility], [address] [place], [place] [address]   Blood culture (routine x 2)     Status: None (Preliminary result)   Collection Time: 07/29/22  6:23 AM   Specimen: BLOOD LEFT ARM  Result Value Ref Range   Specimen Description BLOOD LEFT ARM    Special Requests      BOTTLES DRAWN AEROBIC AND ANAEROBIC Blood Culture adequate volume   Culture      NO GROWTH < 24 HOURS Performed at [facility], [address], [place], [place] [address]    Report Status PENDING   Troponin I (High Sensitivity)     Status: Abnormal   Collection Time: 07/29/22  6:23 AM  Result Value Ref Range   Troponin I (High Sensitivity) 128 (HH) <18 ng/L    Comment: DELTA CHECK NOTED CRITICAL RESULT CALLED TO, READ BACK BY AND VERIFIED WITH: EANES,M@0717  BY MATTHEWS, B 10.10.2023 (NOTE) Elevated high sensitivity troponin I (hsTnI) values and significant  changes across serial measurements may suggest ACS but many other  chronic and acute conditions are known to elevate hsTnI results.  Refer to the Links section for chest pain algorithms and additional  guidance. Performed at [facility], [address], [place], [place] [address]   Procalcitonin - Baseline     Status: None    Collection Time: 07/29/22  6:23 AM  Result Value Ref Range   Procalcitonin 0.11 ng/mL    Comment:        Interpretation: PCT (Procalcitonin) <= 0.5 ng/mL: Systemic infection (sepsis) is not likely. Local bacterial infection is possible. (NOTE)       Sepsis PCT Algorithm           Lower Respiratory Tract                                      Infection PCT Algorithm    ----------------------------     ----------------------------         PCT < 0.25 ng/mL                PCT < 0.10 ng/mL          Strongly encourage             Strongly discourage   discontinuation of antibiotics    initiation of antibiotics    ----------------------------     -----------------------------       PCT 0.25 - 0.50 ng/mL            PCT 0.10 - 0.25 ng/mL               OR       >80% decrease in PCT            Discourage initiation of                                            antibiotics      Encourage discontinuation           of antibiotics    ----------------------------     -----------------------------         PCT >= 0.50 ng/mL  PCT 0.26 - 0.50 ng/mL               AND        <80% decrease in PCT             Encourage initiation of                                             antibiotics       Encourage continuation           of antibiotics    ----------------------------     -----------------------------        PCT >= 0.50 ng/mL                  PCT > 0.50 ng/mL               AND         increase in PCT                  Strongly encourage                                      initiation of antibiotics    Strongly encourage escalation           of antibiotics                                     -----------------------------                                           PCT <= 0.25 ng/mL                                                 OR                                        > 80% decrease in PCT                                      Discontinue / Do not initiate                                              antibiotics  Performed at [facility], [address], [place], [place] [address]   Hemoglobin A1c     Status: Abnormal   Collection Time: 07/29/22  6:23 AM  Result Value Ref Range   Hgb A1c MFr Bld 6.3 (H) 4.8 - 5.6 %    Comment: (NOTE) Pre diabetes:          5.7%-6.4%  Diabetes:              >6.4%  Glycemic control for   <7.0% adults with diabetes    Mean Plasma Glucose 134.11 mg/dL    Comment: Performed at [facility], [address] [place], [place] [address]  Strep pneumoniae urinary antigen     Status: None   Collection Time: 07/29/22  6:57 AM  Result Value Ref Range   Strep Pneumo Urinary Antigen NEGATIVE NEGATIVE    Comment:        Infection due to S. pneumoniae cannot be absolutely ruled out since the antigen present may be below the detection limit of the test. Performed at [facility], [address] [place], [place] [address]   MRSA Next Gen by PCR, Nasal     Status: None   Collection Time: 07/29/22  7:29 AM   Specimen: Anterior Nasal Swab  Result Value Ref Range   MRSA by PCR Next Gen NOT DETECTED NOT DETECTED    Comment: (NOTE) The GeneXpert MRSA Assay (FDA approved for NASAL specimens only), is one component of a comprehensive MRSA colonization surveillance program. It is not intended to diagnose MRSA infection nor to guide or monitor treatment for MRSA infections. Test performance is not FDA approved in patients less than 72 years old. Performed at [facility], [address] [place], [place] [address]   SARS Coronavirus 2 by RT PCR (hospital order, performed in [facility] hospital lab) *cepheid single result test* Anterior Nasal Swab     Status: None   Collection Time: 07/29/22  7:29 AM   Specimen: Anterior Nasal Swab  Result Value Ref Range   SARS Coronavirus 2 by RT PCR NEGATIVE NEGATIVE    Comment: (NOTE) SARS-CoV-2 target nucleic acids are NOT DETECTED.  The SARS-CoV-2 RNA is generally detectable in upper and  lower respiratory specimens during the acute phase of infection. The lowest concentration of SARS-CoV-2 viral copies this assay can detect is 250 copies / mL. A negative result does not preclude SARS-CoV-2 infection and should not be used as the sole basis for treatment or other patient management decisions.  A negative result may occur with improper specimen collection / handling, submission of specimen other than nasopharyngeal swab, presence of viral mutation(s) within the areas targeted by this assay, and inadequate number of viral copies (<250 copies / mL). A negative result must be combined with clinical observations, patient history, and epidemiological information.  Fact Sheet for Patients:   RoadLapTop.co.za  Fact Sheet for Healthcare Providers: http://kim-miller.com/  This test is not yet approved or  cleared by the [place] FDA and has been authorized for detection and/or diagnosis of SARS-CoV-2 by FDA under an Emergency Use Authorization (EUA).  This EUA will remain in effect (meaning this test can be used) for the duration of the COVID-19 declaration under Section 564(b)(1) of the Act, 21 U.S.C. section 360bbb-3(b)(1), unless the authorization is terminated or revoked sooner.  Performed at [facility], [address], [place], [place] [address]   Urinalysis, Complete w Microscopic Anterior Nasal Swab     Status: Abnormal   Collection Time: 07/29/22  7:30 AM  Result Value Ref Range   Color, Urine STRAW (A) YELLOW   APPearance CLEAR CLEAR   Specific Gravity, Urine 1.027 1.005 - 1.030   pH 6.0 5.0 - 8.0   Glucose, UA >=500 (A) NEGATIVE mg/dL   Hgb urine dipstick NEGATIVE NEGATIVE   Bilirubin Urine NEGATIVE NEGATIVE   Ketones, ur NEGATIVE NEGATIVE mg/dL   Protein, ur 275 (A) NEGATIVE mg/dL   Nitrite  NEGATIVE NEGATIVE   Leukocytes,Ua NEGATIVE NEGATIVE   RBC / HPF 0-5 0 - 5 RBC/hpf   WBC, UA 0-5 0 - 5 WBC/hpf    Bacteria, UA NONE SEEN NONE SEEN   Squamous Epithelial / LPF 0-5 0 - 5    Comment: Performed at [facility], [address], [place], [place] [address]  Lactic acid, plasma     Status: Abnormal   Collection Time: 07/29/22  8:15 AM  Result Value Ref Range   Lactic Acid, Venous 2.3 (HH) 0.5 - 1.9 mmol/L    Comment: CRITICAL VALUE NOTED.  VALUE IS CONSISTENT WITH PREVIOUSLY REPORTED AND CALLED VALUE. Performed at [facility], [address], [place], [place] [address]   Troponin I (High Sensitivity)     Status: Abnormal   Collection Time: 07/29/22 10:54 AM  Result Value Ref Range   Troponin I (High Sensitivity) 263 (HH) <18 ng/L    Comment: DELTA CHECK NOTED CRITICAL RESULT CALLED TO, READ BACK BY AND VERIFIED WITH: M.EAMES BY LBASTON AT 1150, 07/29/22 (NOTE) Elevated high sensitivity troponin I (hsTnI) values and significant  changes across serial measurements may suggest ACS but many other  chronic and acute conditions are known to elevate hsTnI results.  Refer to the Links section for chest pain algorithms and additional  guidance. Performed at [facility], [address], [place], [place] [address]   Glucose, capillary     Status: None   Collection Time: 07/29/22 12:52 PM  Result Value Ref Range   Glucose-Capillary 76 70 - 99 mg/dL    Comment: Glucose reference range applies only to samples taken after fasting for at least 8 hours.  HIV Antibody (routine testing w rflx)     Status: None   Collection Time: 07/29/22  1:27 PM  Result Value Ref Range   HIV Screen 4th Generation wRfx Non Reactive Non Reactive    Comment: Performed at [facility], [address] [place], [place] [address]  Magnesium     Status: None   Collection Time: 07/29/22  1:27 PM  Result Value Ref Range   Magnesium 2.0 1.7 - 2.4 mg/dL    Comment: Performed at [facility], [address] [place], [place] [address]  Phosphorus     Status: None   Collection Time: 07/29/22  1:27 PM  Result Value Ref  Range   Phosphorus 3.7 2.5 - 4.6 mg/dL    Comment: Performed at [facility], [address], [place], [place] [address]  Glucose, capillary     Status: Abnormal   Collection Time: 07/29/22  4:09 PM  Result Value Ref Range   Glucose-Capillary 103 (H) 70 - 99 mg/dL    Comment: Glucose reference range applies only to samples taken after fasting for at least 8 hours.  Glucose, capillary     Status: Abnormal   Collection Time: 07/29/22  8:18 PM  Result Value Ref Range   Glucose-Capillary 126 (H) 70 - 99 mg/dL    Comment: Glucose reference range applies only to samples taken after fasting for at least 8 hours.  Glucose, capillary     Status: Abnormal   Collection Time: 07/30/22 12:14 AM  Result Value Ref Range   Glucose-Capillary 113 (H) 70 - 99 mg/dL    Comment: Glucose reference range applies only to samples taken after fasting for at least 8 hours.  Blood gas, arterial     Status: None   Collection Time: 07/30/22  3:19 AM  Result Value Ref Range  FIO2 40.0 %   pH, Arterial 7.42 7.35 - 7.45   pCO2 arterial 35 32 - 48 mmHg   pO2, Arterial 100 83 - 108 mmHg   Bicarbonate 22.7 20.0 - 28.0 mmol/L   Acid-base deficit 1.3 0.0 - 2.0 mmol/L   O2 Saturation 99 %   Patient temperature 37.0    Collection site RIGHT RADIAL    Drawn by [id number]    Allens test (pass/fail) PASS PASS    Comment: Performed at [facility], [address] [place], [place] [address]  Comprehensive metabolic panel     Status: Abnormal   Collection Time: 07/30/22  4:34 AM  Result Value Ref Range   Sodium 142 135 - 145 mmol/L   Potassium 3.2 (L) 3.5 - 5.1 mmol/L   Chloride 110 98 - 111 mmol/L   CO2 24 22 - 32 mmol/L   Glucose, Bld 145 (H) 70 - 99 mg/dL    Comment: Glucose reference range applies only to samples taken after fasting for at least 8 hours.   BUN 12 8 - 23 mg/dL   Creatinine, Ser 4.74 (H) 0.44 - 1.00 mg/dL   Calcium 8.9 8.9 - 25.9 mg/dL   Total Protein 6.0 (L) 6.5 - 8.1 g/dL   Albumin 3.3 (L) 3.5 - 5.0  g/dL   AST 52 (H) 15 - 41 U/L   ALT 51 (H) 0 - 44 U/L   Alkaline Phosphatase 71 38 - 126 U/L   Total Bilirubin 0.9 0.3 - 1.2 mg/dL   GFR, Estimated 54 (L) >60 mL/min    Comment: (NOTE) Calculated using the CKD-EPI Creatinine Equation (2021)    Anion gap 8 5 - 15    Comment: Performed at [facility], [address] [place], [place] [address]  CBC     Status: Abnormal   Collection Time: 07/30/22  4:34 AM  Result Value Ref Range   WBC 14.6 (H) 4.0 - 10.5 K/uL   RBC 4.14 3.87 - 5.11 MIL/uL   Hemoglobin 11.9 (L) 12.0 - 15.0 g/dL   HCT 56.4 33.2 - 95.1 %   MCV 87.9 80.0 - 100.0 fL   MCH 28.7 26.0 - 34.0 pg   MCHC 32.7 30.0 - 36.0 g/dL   RDW 88.4 16.6 - 06.3 %   Platelets 175 150 - 400 K/uL   nRBC 0.0 0.0 - 0.2 %    Comment: Performed at [facility], [address] [place], [place] [address]  Magnesium     Status: None   Collection Time: 07/30/22  4:34 AM  Result Value Ref Range   Magnesium 1.9 1.7 - 2.4 mg/dL    Comment: Performed at [facility], [address], [place], [place] [address]  Triglycerides     Status: Abnormal   Collection Time: 07/30/22  4:34 AM  Result Value Ref Range   Triglycerides 151 (H) <150 mg/dL    Comment: Performed at [facility], [address], [place], [place] [address]  Glucose, capillary     Status: Abnormal   Collection Time: 07/30/22  4:42 AM  Result Value Ref Range   Glucose-Capillary 135 (H) 70 - 99 mg/dL    Comment: Glucose reference range applies only to samples taken after fasting for at least 8 hours.  Glucose, capillary     Status: Abnormal   Collection Time: 07/30/22  7:22 AM  Result Value Ref Range   Glucose-Capillary 120 (H) 70 - 99 mg/dL    Comment: Glucose reference range applies only to  samples taken after fasting for at least 8 hours.    ECG   Sinus tachycardia, LVH by voltage, LBBB- Personally Reviewed  Telemetry   Sinus tachycardia, LBBB- Personally Reviewed  Radiology   DG Chest Port 1 View  Result Date:  07/30/2022 CLINICAL DATA:  Acute on chronic respiratory failure with hypoxemia. EXAM: PORTABLE CHEST 1 VIEW COMPARISON:  07/29/2022 FINDINGS: ET tube tip is above the carina. Enteric tube tip courses below the level of the hemidiaphragms. Unchanged asymmetric elevation of the right hemidiaphragm. Persistent airspace disease within the left lower lobe which appears unchanged from the previous exam. Decreased interstitial edema pattern. IMPRESSION: 1. Stable support apparatus. 2. Persistent left lower lobe airspace disease. 3. Decreased interstitial edema pattern. Electronically Signed   By: Signa Kell M.D.   On: 07/30/2022 08:54   ECHOCARDIOGRAM COMPLETE  Result Date: 07/29/2022    ECHOCARDIOGRAM REPORT   Patient Name:   Summer Christensen Date of Exam: 07/29/2022 Medical Rec #:  [id number]     Height:       64.0 in Accession #:    [id number]    Weight:       145.7 lb Date of Birth:  1950/12/02     BSA:          1.710 m Patient Age:    70 years      BP:           102/69 mmHg Patient Gender: F             HR:           95 bpm. [facility] Procedure: 2D Echo, Color Doppler, Cardiac Doppler and Intracardiac            Opacification Agent Indications:    I50.21 Acute systolic (congestive) heart failure  History:        Patient has prior history of Echocardiogram examinations, most                 recent 01/08/2011. Arrythmias:LBBB; Risk Factors:Hypertension and                 Diabetes.  Sonographer:    Irving Burton Senior RDCS Referring Phys: [id number] OLADAPO ADEFESO  Sonographer Comments: Scanned supine on artificial respirator. IMPRESSIONS  1. Left ventricular ejection fraction, by estimation, is <20%. The left ventricle has severely decreased function. The left ventricle demonstrates global hypokinesis. The left ventricular internal cavity size was moderately dilated. Left ventricular diastolic parameters are consistent with Grade III diastolic dysfunction (restrictive).  2. Right ventricular systolic  function is normal. The right ventricular size is normal.  3. The mitral valve is normal in structure. Mild to moderate mitral valve regurgitation. No evidence of mitral stenosis.  4. The aortic valve is tricuspid. Aortic valve regurgitation is not visualized. No aortic stenosis is present.  5. The inferior vena cava is normal in size with greater than 50% respiratory variability, suggesting right atrial pressure of 3 mmHg. FINDINGS  Left Ventricle: Left ventricular ejection fraction, by estimation, is <20%. The left ventricle has severely decreased function. The left ventricle demonstrates global hypokinesis. Definity contrast agent was given IV to delineate the left ventricular endocardial borders. The left ventricular internal cavity size was moderately dilated. There is no left ventricular hypertrophy. Abnormal (paradoxical) septal motion, consistent with left bundle branch block. Left ventricular diastolic parameters are consistent with Grade III diastolic dysfunction (restrictive). Right Ventricle: The right ventricular size is normal. No increase in right ventricular wall thickness.  Right ventricular systolic function is normal. Left Atrium: Left atrial size was normal in size. Right Atrium: Right atrial size was normal in size. Pericardium: There is no evidence of pericardial effusion. Mitral Valve: The mitral valve is normal in structure. Mild to moderate mitral valve regurgitation. No evidence of mitral valve stenosis. Tricuspid Valve: The tricuspid valve is normal in structure. Tricuspid valve regurgitation is not demonstrated. No evidence of tricuspid stenosis. Aortic Valve: The aortic valve is tricuspid. Aortic valve regurgitation is not visualized. No aortic stenosis is present. Pulmonic Valve: The pulmonic valve was normal in structure. Pulmonic valve regurgitation is mild. No evidence of pulmonic stenosis. Aorta: The aortic root is normal in size and structure. Venous: The inferior vena cava is  normal in size with greater than 50% respiratory variability, suggesting right atrial pressure of 3 mmHg. IAS/Shunts: No atrial level shunt detected by color flow Doppler.  LEFT VENTRICLE PLAX 2D LVIDd:         5.90 cm      Diastology LVIDs:         4.50 cm      LV e' medial:    4.90 cm/s LV PW:         1.00 cm      LV E/e' medial:  20.8 LV IVS:        0.90 cm      LV e' lateral:   7.94 cm/s LVOT diam:     1.90 cm      LV E/e' lateral: 12.8 LV SV:         30 LV SV Index:   18 LVOT Area:     2.84 cm  LV Volumes (MOD) LV vol d, MOD A2C: 131.0 ml LV vol d, MOD A4C: 114.0 ml LV vol s, MOD A2C: 114.0 ml LV vol s, MOD A4C: 102.0 ml LV SV MOD A2C:     17.0 ml LV SV MOD A4C:     114.0 ml LV SV MOD BP:      14.8 ml RIGHT VENTRICLE RV S prime:     9.03 cm/s TAPSE (M-mode): 1.9 cm LEFT ATRIUM             Index        RIGHT ATRIUM           Index LA diam:        4.00 cm 2.34 cm/m   RA Area:     12.70 cm LA Vol (A2C):   56.3 ml 32.92 ml/m  RA Volume:   26.30 ml  15.38 ml/m LA Vol (A4C):   39.7 ml 23.22 ml/m LA Biplane Vol: 48.8 ml 28.54 ml/m  AORTIC VALVE LVOT Vmax:   92.70 cm/s LVOT Vmean:  62.300 cm/s LVOT VTI:    0.106 m  AORTA Ao Root diam: 3.00 cm Ao Asc diam:  3.30 cm MITRAL VALVE MV Area (PHT): 5.50 cm     SHUNTS MV Decel Time: 138 msec     Systemic VTI:  0.11 m MV E velocity: 102.00 cm/s  Systemic Diam: 1.90 cm MV A velocity: 30.00 cm/s MV E/A ratio:  3.40 Donato Schultz MD Electronically signed by Donato Schultz MD Signature Date/Time: 07/29/2022/10:33:16 AM    Final    DG Chest Portable 1 View  Result Date: 07/29/2022 CLINICAL DATA:  NG tube placement. EXAM: PORTABLE CHEST 1 VIEW COMPARISON:  07/29/2022 FINDINGS: 0451 hours. Endotracheal tube tip is 1.3 cm above the base of the carina. The NG tube passes into  the stomach although the distal tip position is not included on the film. The cardio pericardial silhouette is enlarged. Vascular congestion noted with underlying chronic interstitial coarsening. Mild  bibasilar atelectasis noted without substantial pleural effusion. Telemetry leads overlie the chest. IMPRESSION: Endotracheal tube tip is 1.3 cm above the base of the carina. NG tube passes into the stomach. Cardiomegaly with vascular congestion and basilar atelectasis. Electronically Signed   By: Kennith Center M.D.   On: 07/29/2022 05:30   CT Angio Chest/Abd/Pel for Dissection W and/or Wo Contrast  Addendum Date: 07/29/2022   ADDENDUM REPORT: 07/29/2022 05:09 ADDENDUM: ETT position and other salient findings discussed by telephone with Dr. Geoffery Lyons on 07/29/2022 at 0501 hours. Electronically Signed   By: Odessa Fleming M.D.   On: 07/29/2022 05:09   Result Date: 07/29/2022 CLINICAL DATA:  71 year old female unresponsive at home. Altered mental status. Intubated. EXAM: CT ANGIOGRAPHY CHEST, ABDOMEN AND PELVIS TECHNIQUE: Non-contrast CT of the chest was initially obtained. Multidetector CT imaging through the chest, abdomen and pelvis was performed using the standard protocol during bolus administration of intravenous contrast. Multiplanar reconstructed images and MIPs were obtained and reviewed to evaluate the vascular anatomy. RADIATION DOSE REDUCTION: This exam was performed according to the departmental dose-optimization program which includes automated exposure control, adjustment of the mA and/or kV according to patient size and/or use of iterative reconstruction technique. CONTRAST:  OMNIPAQUE IOHEXOL 350 MG/ML SOLN COMPARISON:  Portable chest 0347 hours today. CTA chest 01/06/2011. CT Abdomen and Pelvis 12/25/2021. FINDINGS: CTA CHEST FINDINGS Cardiovascular: Calcified aortic atherosclerosis. Calcified coronary artery atherosclerosis. Mild cardiomegaly appears stable since March. No pericardial effusion. Following contrast there is no thoracic aorta dissection. Incidental cardiac pulsation artifact. No thoracic aortic aneurysm. Also, the central pulmonary arteries are enhancing and appear to be  patent. Mediastinum/Nodes: Negative. Lungs/Pleura: Intubated, endotracheal tube tip terminates just at the right mainstem bronchus on series 10, image 72. Atelectatic changes to the airways at the carina. Dependent, confluent pulmonary consolidation in both lower lobes, greater on the left. Early dependent upper lobe involvement. Small pulmonary arterial branches appear to be enhancing in the areas of consolidation. Superimposed perihilar and centrilobular pulmonary ground-glass opacity in both lungs with some septal thickening. Trace layering pleural effusions. Musculoskeletal: Respiratory motion at the sternum and ribs. No acute osseous abnormality identified. Review of the MIP images confirms the above findings. CTA ABDOMEN AND PELVIS FINDINGS VASCULAR Mild Aortoiliac calcified atherosclerosis. Negative for abdominal aortic aneurysm or dissection. Major arterial structures are patent. Portal venous system also appears to be patent. Review of the MIP images confirms the above findings. NON-VASCULAR Hepatobiliary: Chronically absent gallbladder. Liver appears stable and negative when allowing for motion artifact. Pancreas: Stable, negative. Spleen: Within normal limits. Adrenals/Urinary Tract: Stable, negative adrenal glands. Asymmetric right renal cortical scarring and volume loss. Symmetric renal enhancement. Motion artifact. No convincing hydronephrosis or pararenal inflammation. Ureters appear decompressed. No obvious urinary calculus. Unremarkable bladder. Stomach/Bowel: Intermittent motion artifact. Redundant but decompressed large bowel except for retained stool in the transverse colon. Normal retrocecal appendix on series 6, image 152. No dilated small bowel. Small volume of air in fluid in the stomach. Negative duodenum. No free air, free fluid, or mesenteric inflammation identified. Lymphatic: No lymphadenopathy. Reproductive: Chronically absent uterus, diminutive or absent ovaries. Other: No pelvic free  fluid. Musculoskeletal: No acute osseous abnormality identified. Review of the MIP images confirms the above findings. IMPRESSION: 1. Intubated, endotracheal tube tip terminates just inside the right mainstem bronchus. Recommend retracting 1 cm.  2. Negative for aortic dissection or aneurysm. Aortic Atherosclerosis (ICD10-I70.0). 3. Positive for dependent consolidation in both lungs, especially lower lobes greater on the left. This is most compatible with Pneumonia Versus Aspiration. 4. Trace layering pleural effusions and additional central pulmonary ground-glass opacity with septal thickening raising the possibility of mild or developing interstitial edema. Stable cardiomegaly. No pericardial effusion. 5. No acute or inflammatory process identified in the abdomen or pelvis. Chronic right renal cortical scarring. Electronically Signed: By: Odessa Fleming M.D. On: 07/29/2022 04:57   CT Head Wo Contrast  Result Date: 07/29/2022 CLINICAL DATA:  71 year old female unresponsive at home. Altered mental status. Intubated. EXAM: CT HEAD WITHOUT CONTRAST TECHNIQUE: Contiguous axial images were obtained from the base of the skull through the vertex without intravenous contrast. RADIATION DOSE REDUCTION: This exam was performed according to the departmental dose-optimization program which includes automated exposure control, adjustment of the mA and/or kV according to patient size and/or use of iterative reconstruction technique. COMPARISON:  Head CT 08/03/2009. FINDINGS: Brain: Cerebral volume is within normal limits for age. No midline shift, ventriculomegaly, mass effect, evidence of mass lesion, intracranial hemorrhage or evidence of cortically based acute infarction. Gray-white matter differentiation is within normal limits for age. No convincing encephalomalacia. Vascular: Calcified atherosclerosis at the skull base. No suspicious intracranial vascular hyperdensity. Skull: Stable.  No acute osseous abnormality identified.  Sinuses/Orbits: Visualized paranasal sinuses and mastoids are stable and well aerated. Other: Visualized orbits and scalp soft tissues are within normal limits. IMPRESSION: Normal for age non contrast CT appearance of the brain, head. Electronically Signed   By: Odessa Fleming M.D.   On: 07/29/2022 04:48   DG Chest Portable 1 View  Result Date: 07/29/2022 CLINICAL DATA:  71 year old female with shortness of breath. EXAM: PORTABLE CHEST 1 VIEW COMPARISON:  Chest radiographs 12/31/2011 and earlier. CT Abdomen and Pelvis 12/25/2021. FINDINGS: Portable AP semi upright view at 0347 hours. Cardiomegaly, new since 2013 but appears stable from the CT Abdomen and Pelvis this year. Other mediastinal contours are within normal limits. Visualized tracheal air column is within normal limits. Lung volumes are within normal limits. No pneumothorax, pleural effusion or consolidation. Mild streaky bilateral perihilar opacity. Increased pulmonary vascularity from prior radiographs, but no overt edema. No acute osseous abnormality identified. Paucity of bowel gas in the upper abdomen. IMPRESSION: 1. Cardiomegaly with vascular congestion but no overt edema. 2. Streaky bilateral perihilar opacity, nonspecific but favor atelectasis. Electronically Signed   By: Odessa Fleming M.D.   On: 07/29/2022 04:00    Cardiac Studies   N/A  Impression   Principal Problem:   Sepsis due to undetermined organism [facility]) Active Problems:   Essential hypertension   Acute respiratory failure with hypoxia and hypercarbia (HCC)   Aspiration pneumonia (HCC)   Hypertensive emergency   Pleural effusion   Elevated brain natriuretic peptide (BNP) level   Lobar pneumonia (HCC)   Elevated troponin   Controlled type 2 diabetes mellitus with hyperglycemia (HCC)   Recommendation   Acute systolic congestive heart failure,.NYHA Class IV symptoms Echo yesterday showed LVEF less than 20% with global hypokinesis.  Etiology is not clear however may be related  to sepsis/infection, but more likely nonischemic etiology, especially as the LV is dilated..  Would ultimately need to rule out ischemic causes, however.  Would work to institute guideline directed medical therapy for heart failure.  Given elevated BNP would institute diuresis with IV Lasix 40 mg twice daily. She is currently on metoprolol 25 mg QID. Was  on lisinopril at home (losartan was also on her list) - washout off this for 36 hours and would start Entresto 49/51 mg BID once she is extubated and taking pills. Chronic LBBB This is a longstanding finding dating back to 2012 at which time her LVEF was normal and work-up was negative.  I doubt this is led to her cardiomyopathy since this would likely have happened a long time ago. Elevated troponin/demand ischemia Minimal troponin elevations likely related to demand ischemia, ACS is unlikely.  Ultimately will need an ischemic evaluation giving age and risk factors. Hypertensive emergency Blood pressure has improved, I think most of this was due to severe hypoxemia and volume overload.  Plan diuresis with Lasix, on metoprolol and will institute Entresto as above. Lobar pneumonia Leukocytosis appears to be improving on broad-spectrum antibiotics.  Case discussed with Dr. Sherene Sires.  Plan spontaneous breathing trial tomorrow once she is more euvolemic. Hypokalemia Replete potassium to >4.0  Thanks for the consultation. Cardiology will follow closely with you.  CRITICAL CARE TIME: I have spent a total of 65 minutes with patient reviewing hospital notes, telemetry, EKGs, labs and examining the patient as well as establishing an assessment and plan that was discussed with the patient.  > 50% of time was spent in direct patient care. The patient is critically ill with multi-organ system failure and requires high complexity decision making for assessment and support, frequent evaluation and titration of therapies, application of advanced monitoring technologies  and extensive interpretation of multiple databases.   Length of Stay:  LOS: 1 day   Chrystie Nose, MD, [facility], FACP  [place]  [facility]  Medical Director of the [facility]  [facility]ovascular [facility] Reduction [facility] Diplomate of the American Board of Clinical Lipidology Attending Cardiologist  Direct Dial: [phone]  Fax: [phone]  Website:  [email or web address] Nicely Aaren Krog 07/30/2022, 10:39 AM

## 2022-07-30 NOTE — Progress Notes (Signed)
Initial Nutrition Assessment  DOCUMENTATION CODES:      INTERVENTION:  If patient remains unable to wean and is hemodynamically stable-recommend:  -Vital High Protein continuous @ 20 ml/hr via OGT   -Add 60 ml ProSource TF20 TID- per tube (240 kcal, 60 gr protein)  -Tube feeding regimen provides: 720 kcal, 102 grams of protein, and 401 ml of H2O.    -Sedation provides- 628 additional kcal.   NUTRITION DIAGNOSIS:   Inadequate oral intake related to inability to eat as evidenced by NPO status.   GOAL:  Provide needs based on ASPEN/SCCM guidelines  MONITOR:  Vent status, Labs, I & O's, Weight trends  REASON FOR ASSESSMENT:   Ventilator    ASSESSMENT: Patient is a 71 yo female who presents with shortness of breath, altered mental status, obtunded with O2 sats in the 40's. Intubated in ED. Last meal on 10/9 per MD note.   Patient is currently intubated on ventilator support with OGT at 56 cm (external length). Placement verified by x-ray. Tube clamped. Talked with nursing. Cardiology to see patient today. Weight history reviewed.   MV: 8.4 L/min. MAP-86  Temp (24hrs), Avg:100 F (37.8 C), Min:98.8 F (37.1 C), Max:101.5 F (38.6 C)  Propofol: 23.8 ml/hr (628 kcal)   Intake/Output Summary (Last 24 hours) at 07/30/2022 1123 Last data filed at 07/30/2022 0900 Gross per 24 hour  Intake 2738.84 ml  Output 2900 ml  Net -161.16 ml     Medications: lopressor, lipitor, klor-con.   Low potassium being repleted    Latest Ref Rng & Units 07/30/2022    4:34 AM 07/29/2022    3:51 AM 07/29/2022    3:45 AM  BMP  Glucose 70 - 99 mg/dL 145  248  253   BUN 8 - 23 mg/dL 12  16  16    Creatinine 0.44 - 1.00 mg/dL 1.10  0.90  0.94   Sodium 135 - 145 mmol/L 142  143  141   Potassium 3.5 - 5.1 mmol/L 3.2  3.7  3.6   Chloride 98 - 111 mmol/L 110  109  109   CO2 22 - 32 mmol/L 24   20   Calcium 8.9 - 10.3 mg/dL 8.9   9.4       NUTRITION - FOCUSED PHYSICAL EXAM: Limited  Nutrition-Focused physical exam completed. Findings are no fat depletion, mild clavicle muscle depletion, and mild edema.     Diet Order:   Diet Order             Diet NPO time specified  Diet effective now                   EDUCATION NEEDS:  Not appropriate for education at this time  Skin:  Skin Assessment: Reviewed RN Assessment  Last BM:  unknown  Height:   Ht Readings from Last 1 Encounters:  07/30/22 5\' 4"  (1.626 m)    Weight:   Wt Readings from Last 1 Encounters:  07/29/22 70.4 kg    Ideal Body Weight:   55 kg  BMI:  Body mass index is 26.64 kg/m.  Estimated Nutritional Needs:   Kcal:  1512  Protein:  90-95 gr  Fluid:  >1500 ml daily  Colman Cater MS,RD,CSG,LDN Contact: Shea Evans

## 2022-07-30 NOTE — Progress Notes (Signed)
PROGRESS NOTE  Summer Christensen ELF:[id number] DOB: 04-27-1951 DOA: 07/29/2022 PCP: Vidal Schwalbe, MD  Brief History:  71 year old female with a history of hypertension, hyperlipidemia, left bundle branch block, migraine headache presenting with respiratory distress.  The patient is currently intubated and sedated.  History is obtained from speaking with the patient's son at the bedside and review of the medical record.  Apparently, the patient had been in her usual state of health up until the early morning 07/29/2022.  The patient woke up with shortness of breath.  She was brought to the emergency department by her son.  Upon arrival, the patient was noted to be obtunded in respiratory distress with saturation 48% on room air.  She was placed on nonrebreather with improvement to 64%.  She was subsequently intubated. Son and daughter at the bedside stated that the patient had been in her usual state of health without any complaints up until the time of admission.  She had not had any fevers, chills, headache, chest pain, shortness of breath, coughing, hemoptysis, nausea, vomiting. Notably, the patient has had chronic diarrhea for which she was seeing GI at [facility].  It was felt to be due to functional diarrhea versus IBS versus microscopic colitis with a component of biliary diarrhea.  She was last seen by GI at [facility] on 11/28/2019, and has been lost to follow-up since then.  There have been no reports of hematochezia or melena per the patient's family.  There is been no new medications.  The patient has not had any travels.  She has not had any sick contacts. In the ED, the patient was afebrile and hemodynamically stable.  She was intubated and sedated on the ventilator.  WBC 25.0, hemoglobin 14.1, platelets 254,000.  Sodium 141, potassium 3.6, CO2 20, serum creatinine 0.94.  AST 36, ALT 33, alk phosphatase 87, total bilirubin 0.7.  BNP 1157.  Troponin 17>> 128.  CT a chest was negative for pulmonary  embolus or dissection.  There was bilateral lower lobe consolidation, left greater than right with early upper lobe involvement.  There was superimposed perihilar and centrilobular pulmonary GGO with septal thickening.  CT of the abdomen and pelvis showed right renal cortical scarring.  There was retained stool in the transverse colon.  Otherwise there is no other acute intra-abdominal abnormalities.  The patient was started on vancomycin and Zosyn.    Assessment and Plan: * Sepsis due to undetermined organism ([place]) -Secondary to pneumonia -Sepsis present at time of admission with leukocytosis, tachycardia, respiratory distress -Procalcitonin 0.11 -Follow culture result -Continue treatment with Zosyn and Zithromax  -Patient is still spiking fever -Continue as needed antipyretics and cooling blankets.   -Continue supportive care.   Acute respiratory failure with hypoxia and hypercarbia (HCC) -Secondary to pneumonia with component of fluid overload -Intubated in the ED -PCCM consulted and assisting with management and ventilator care. -Continue ventilatory support; SBT and weaning process to be started on 07/31/2022.   -Follow A.m. chest x-ray -Continue current antibiotics; vancomycin DC in the setting of negative MRSA PCR.  Lobar pneumonia (HCC) -Procalcitonin 0.11 -MRSA PCR negative; vancomycin discontinued -Continue Zosyn and Zithromax -Continue supportive care and follow culture results. -COVID and influenza PCR negative.   Elevated troponin -Secondary to demand ischemia and the presence of acute systolic heart failure. -EKG without acute ischemic changes -2D echo demonstrating ejection fraction around 20% -Cardiology service consulted -Lasix and metoprolol has been initiated. -Will follow recommendations.  Elevated brain natriuretic peptide (BNP) level -BNP 1157 -Vascular congestion and interstitial edema appreciated on chest images -2D echo demonstrating acute systolic  heart failure -Cardiology service has been consulted -Continue to follow daily weights/strict intake and output -Lasix has been initiated -Metoprolol started.    Hypokalemia - In the setting of diuresis -Replete electrolytes and follow trend -Continue telemetry monitoring.  Controlled type 2 diabetes mellitus with hyperglycemia ([place]) -Updated hemoglobin A1c 6.3 -Patient is not on any agents in the outpatient setting -Continue sliding scale insulin while inpatient -Follow CBGs fluctuation -Lifestyle changes and modified carbohydrate diet to be resumed as an outpatient (patient with prediabetes parameters currently).  Essential hypertension -Holding losartan temporarily due to soft blood pressures after intubation -Also needing blood pressure stability for treatment with beta-blocker and diuretics.   Family Communication:   son at bedside 07/30/2022.  Consultants:  PCCM, cardiology service  Code Status:  FULL /  DVT Prophylaxis: [place] Lovenox   Procedures: As Listed in Progress Note Above  Antibiotics: Vanc 10/10>>10/11 (MRSA PCR negative) Zosyn 10/10>> Azithro 10/10>>  Subjective: Still spiking fever; Tmax 101.7.  No nausea, no vomiting, remains sedated, intubated and mechanically ventilated.  Objective: Vitals:   07/30/22 1300 07/30/22 1400 07/30/22 1500 07/30/22 1608  BP: 123/74 131/67 (!) 146/74   Pulse: 88 84 88 82  Resp: (!) 21 20 (!) 22 20  Temp: (!) 101.1 F (38.4 C)   (!) 101.7 F (38.7 C)  TempSrc:    Bladder  SpO2: 97% 98% 99% 98%  Weight:      Height:        Intake/Output Summary (Last 24 hours) at 07/30/2022 1644 Last data filed at 07/30/2022 1626 Gross per 24 hour  Intake 3244.12 ml  Output 3400 ml  Net -155.88 ml   Weight change: 4.3 kg Exam: General exam: Tmax 101.7; patient is intubated, sedated and mechanically ventilated.  At time of examination not following commands. Respiratory system: No using accessory muscles; positive rhonchi  bilaterally.  No wheezing on exam.  Ventilatory support in place. Cardiovascular system:RRR.  No rubs, no gallops, no JVD appreciated on examination. Gastrointestinal system: Abdomen is nondistended, soft and nontender. No organomegaly or masses felt. Normal bowel sounds heard. Central nervous system: Unable to properly assess with current sedation. Extremities: No cyanosis or clubbing. Skin: No petechiae. Psychiatry: Unable to properly assess with current sedation.   Data Reviewed: I have personally reviewed following labs and imaging studies  Basic Metabolic Panel: Recent Labs  Lab 07/29/22 0345 07/29/22 0351 07/29/22 1327 07/30/22 0434  NA 141 143  --  142  K 3.6 3.7  --  3.2*  CL 109 109  --  110  CO2 20*  --   --  24  GLUCOSE 253* 248*  --  145*  BUN 16 16  --  12  CREATININE 0.94 0.90  --  1.10*  CALCIUM 9.4  --   --  8.9  MG  --   --  2.0 1.9  PHOS  --   --  3.7  --    Liver Function Tests: Recent Labs  Lab 07/29/22 0345 07/30/22 0434  AST 36 52*  ALT 33 51*  ALKPHOS 87 71  BILITOT 0.7 0.9  PROT 7.7 6.0*  ALBUMIN 4.1 3.3*   CBC: Recent Labs  Lab 07/29/22 0345 07/29/22 0351 07/30/22 0434  WBC 25.0*  --  14.6*  NEUTROABS 14.3*  --   --   HGB 14.1 14.3 11.9*  HCT 43.7 42.0  36.4  MCV 89.0  --  87.9  PLT 254  --  175   BNP: Invalid input(s): "POCBNP"  CBG: Recent Labs  Lab 07/30/22 0014 07/30/22 0442 07/30/22 0722 07/30/22 1207 07/30/22 1606  GLUCAP 113* 135* 120* 143* 142*   HbA1C: Recent Labs    07/29/22 0623  HGBA1C 6.3*   Urine analysis:    Component Value Date/Time   COLORURINE STRAW (A) 07/29/2022 0730   APPEARANCEUR CLEAR 07/29/2022 0730   APPEARANCEUR Clear 11/20/2017 1200   LABSPEC 1.027 07/29/2022 0730   PHURINE 6.0 07/29/2022 0730   GLUCOSEU >=500 (A) 07/29/2022 0730   HGBUR NEGATIVE 07/29/2022 0730   BILIRUBINUR NEGATIVE 07/29/2022 0730   BILIRUBINUR Negative 11/20/2017 1200   KETONESUR NEGATIVE 07/29/2022 0730    PROTEINUR 100 (A) 07/29/2022 0730   UROBILINOGEN 0.2 11/14/2010 1850   NITRITE NEGATIVE 07/29/2022 0730   LEUKOCYTESUR NEGATIVE 07/29/2022 0730   Sepsis Labs: @LABRCNTIP (procalcitonin:4,lacticidven:4) ) Recent Results (from the past 240 hour(s))  Blood culture (routine x 2)     Status: None (Preliminary result)   Collection Time: 07/29/22  6:17 AM   Specimen: BLOOD RIGHT ARM  Result Value Ref Range Status   Specimen Description BLOOD RIGHT ARM  Final   Special Requests   Final    BOTTLES DRAWN AEROBIC AND ANAEROBIC Blood Culture adequate volume   Culture   Final    NO GROWTH < 24 HOURS Performed at [facility], [address], [place], [place] [address]    Report Status PENDING  Incomplete  Blood culture (routine x 2)     Status: None (Preliminary result)   Collection Time: 07/29/22  6:23 AM   Specimen: BLOOD LEFT ARM  Result Value Ref Range Status   Specimen Description BLOOD LEFT ARM  Final   Special Requests   Final    BOTTLES DRAWN AEROBIC AND ANAEROBIC Blood Culture adequate volume   Culture   Final    NO GROWTH < 24 HOURS Performed at [facility], [address], [place], [place] [address]    Report Status PENDING  Incomplete  MRSA Next Gen by PCR, Nasal     Status: None   Collection Time: 07/29/22  7:29 AM   Specimen: Anterior Nasal Swab  Result Value Ref Range Status   MRSA by PCR Next Gen NOT DETECTED NOT DETECTED Final    Comment: (NOTE) The GeneXpert MRSA Assay (FDA approved for NASAL specimens only), is one component of a comprehensive MRSA colonization surveillance program. It is not intended to diagnose MRSA infection nor to guide or monitor treatment for MRSA infections. Test performance is not FDA approved in patients less than 61 years old. Performed at [facility], [address] [place], [place] [address]   SARS Coronavirus 2 by RT PCR (hospital order, performed in [facility] hospital lab) *cepheid single result test* Anterior Nasal Swab     Status:  None   Collection Time: 07/29/22  7:29 AM   Specimen: Anterior Nasal Swab  Result Value Ref Range Status   SARS Coronavirus 2 by RT PCR NEGATIVE NEGATIVE Final    Comment: (NOTE) SARS-CoV-2 target nucleic acids are NOT DETECTED.  The SARS-CoV-2 RNA is generally detectable in upper and lower respiratory specimens during the acute phase of infection. The lowest concentration of SARS-CoV-2 viral copies this assay can detect is 250 copies / mL. A negative result does not preclude SARS-CoV-2 infection and should not be used as the sole basis for treatment or other patient management  decisions.  A negative result may occur with improper specimen collection / handling, submission of specimen other than nasopharyngeal swab, presence of viral mutation(s) within the areas targeted by this assay, and inadequate number of viral copies (<250 copies / mL). A negative result must be combined with clinical observations, patient history, and epidemiological information.  Fact Sheet for Patients:   https://www.patel.info/  Fact Sheet for Healthcare Providers: https://hall.com/  This test is not yet approved or  cleared by the [place] FDA and has been authorized for detection and/or diagnosis of SARS-CoV-2 by FDA under an Emergency Use Authorization (EUA).  This EUA will remain in effect (meaning this test can be used) for the duration of the COVID-19 declaration under Section 564(b)(1) of the Act, 21 U.S.C. section 360bbb-3(b)(1), unless the authorization is terminated or revoked sooner.  Performed at [facility], [address], [place], [place] [address]   Culture, Respiratory w Gram Stain     Status: None (Preliminary result)   Collection Time: 07/30/22  3:00 AM   Specimen: Tracheal Aspirate; Respiratory  Result Value Ref Range Status   Specimen Description   Final    TRACHEAL ASPIRATE Performed at [facility], [address],  [place], [place] [address]    Special Requests   Final    NONE Performed at [facility], [address] [place], [place] [address]    Gram Stain   Final    MODERATE WBC PRESENT, PREDOMINANTLY PMN RARE GRAM POSITIVE COCCI IN PAIRS Performed at [facility], [place] [address] [place], [place] [address]    Culture PENDING  Incomplete   Report Status PENDING  Incomplete     Scheduled Meds:  atorvastatin  40 mg Per Tube Daily   Chlorhexidine Gluconate Cloth  6 each Topical Q0600   enoxaparin (LOVENOX) injection  40 mg Subcutaneous Q24H   furosemide  40 mg Intravenous BID   metoprolol tartrate  25 mg Per Tube QID   mouth rinse  15 mL Mouth Rinse Q2H   pantoprazole (PROTONIX) IV  40 mg Intravenous Q24H   Continuous Infusions:  azithromycin 500 mg (07/30/22 0914)   dextrose 75 mL/hr at 07/30/22 1626   piperacillin-tazobactam (ZOSYN)  IV 3.375 g (07/30/22 1514)   propofol (DIPRIVAN) infusion 60.01 mcg/kg/min (07/30/22 1600)    Procedures/Studies: DG Chest Port 1 View  Result Date: 07/30/2022 CLINICAL DATA:  Acute on chronic respiratory failure with hypoxemia. EXAM: PORTABLE CHEST 1 VIEW COMPARISON:  07/29/2022 FINDINGS: ET tube tip is above the carina. Enteric tube tip courses below the level of the hemidiaphragms. Unchanged asymmetric elevation of the right hemidiaphragm. Persistent airspace disease within the left lower lobe which appears unchanged from the previous exam. Decreased interstitial edema pattern. IMPRESSION: 1. Stable support apparatus. 2. Persistent left lower lobe airspace disease. 3. Decreased interstitial edema pattern. Electronically Signed   By: Kerby Moors M.D.   On: 07/30/2022 08:54   ECHOCARDIOGRAM COMPLETE  Result Date: 07/29/2022    ECHOCARDIOGRAM REPORT   Patient Name:   MARRA FRAGA Wentz Date of Exam: 07/29/2022 Medical Rec #:  [id number]     Height:       64.0 in Accession #:    [id number]    Weight:       145.7 lb Date of Birth:  1950/11/02     BSA:           1.710 m Patient Age:    21 years      BP:  102/69 mmHg Patient Gender: F             HR:           95 bpm. Exam Location:  [facility] Procedure: 2D Echo, Color Doppler, Cardiac Doppler and Intracardiac            Opacification Agent Indications:    O37.85 Acute systolic (congestive) heart failure  History:        Patient has prior history of Echocardiogram examinations, most                 recent 01/08/2011. Arrythmias:LBBB; Risk Factors:Hypertension and                 Diabetes.  Sonographer:    Raquel Sarna Senior RDCS Referring Phys: [id number] OLADAPO ADEFESO  Sonographer Comments: Scanned supine on artificial respirator. IMPRESSIONS  1. Left ventricular ejection fraction, by estimation, is <20%. The left ventricle has severely decreased function. The left ventricle demonstrates global hypokinesis. The left ventricular internal cavity size was moderately dilated. Left ventricular diastolic parameters are consistent with Grade III diastolic dysfunction (restrictive).  2. Right ventricular systolic function is normal. The right ventricular size is normal.  3. The mitral valve is normal in structure. Mild to moderate mitral valve regurgitation. No evidence of mitral stenosis.  4. The aortic valve is tricuspid. Aortic valve regurgitation is not visualized. No aortic stenosis is present.  5. The inferior vena cava is normal in size with greater than 50% respiratory variability, suggesting right atrial pressure of 3 mmHg. FINDINGS  Left Ventricle: Left ventricular ejection fraction, by estimation, is <20%. The left ventricle has severely decreased function. The left ventricle demonstrates global hypokinesis. Definity contrast agent was given IV to delineate the left ventricular endocardial borders. The left ventricular internal cavity size was moderately dilated. There is no left ventricular hypertrophy. Abnormal (paradoxical) septal motion, consistent with left bundle branch block. Left ventricular diastolic  parameters are consistent with Grade III diastolic dysfunction (restrictive). Right Ventricle: The right ventricular size is normal. No increase in right ventricular wall thickness. Right ventricular systolic function is normal. Left Atrium: Left atrial size was normal in size. Right Atrium: Right atrial size was normal in size. Pericardium: There is no evidence of pericardial effusion. Mitral Valve: The mitral valve is normal in structure. Mild to moderate mitral valve regurgitation. No evidence of mitral valve stenosis. Tricuspid Valve: The tricuspid valve is normal in structure. Tricuspid valve regurgitation is not demonstrated. No evidence of tricuspid stenosis. Aortic Valve: The aortic valve is tricuspid. Aortic valve regurgitation is not visualized. No aortic stenosis is present. Pulmonic Valve: The pulmonic valve was normal in structure. Pulmonic valve regurgitation is mild. No evidence of pulmonic stenosis. Aorta: The aortic root is normal in size and structure. Venous: The inferior vena cava is normal in size with greater than 50% respiratory variability, suggesting right atrial pressure of 3 mmHg. IAS/Shunts: No atrial level shunt detected by color flow Doppler.  LEFT VENTRICLE PLAX 2D LVIDd:         5.90 cm      Diastology LVIDs:         4.50 cm      LV e' medial:    4.90 cm/s LV PW:         1.00 cm      LV E/e' medial:  20.8 LV IVS:        0.90 cm      LV e' lateral:   7.94 cm/s LVOT diam:  1.90 cm      LV E/e' lateral: 12.8 LV SV:         30 LV SV Index:   18 LVOT Area:     2.84 cm  LV Volumes (MOD) LV vol d, MOD A2C: 131.0 ml LV vol d, MOD A4C: 114.0 ml LV vol s, MOD A2C: 114.0 ml LV vol s, MOD A4C: 102.0 ml LV SV MOD A2C:     17.0 ml LV SV MOD A4C:     114.0 ml LV SV MOD BP:      14.8 ml RIGHT VENTRICLE RV S prime:     9.03 cm/s TAPSE (M-mode): 1.9 cm LEFT ATRIUM             Index        RIGHT ATRIUM           Index LA diam:        4.00 cm 2.34 cm/m   RA Area:     12.70 cm LA Vol (A2C):   56.3  ml 32.92 ml/m  RA Volume:   26.30 ml  15.38 ml/m LA Vol (A4C):   39.7 ml 23.22 ml/m LA Biplane Vol: 48.8 ml 28.54 ml/m  AORTIC VALVE LVOT Vmax:   92.70 cm/s LVOT Vmean:  62.300 cm/s LVOT VTI:    0.106 m  AORTA Ao Root diam: 3.00 cm Ao Asc diam:  3.30 cm MITRAL VALVE MV Area (PHT): 5.50 cm     SHUNTS MV Decel Time: 138 msec     Systemic VTI:  0.11 m MV E velocity: 102.00 cm/s  Systemic Diam: 1.90 cm MV A velocity: 30.00 cm/s MV E/A ratio:  3.40 Candee Furbish MD Electronically signed by Candee Furbish MD Signature Date/Time: 07/29/2022/10:33:16 AM    Final    DG Chest Portable 1 View  Result Date: 07/29/2022 CLINICAL DATA:  NG tube placement. EXAM: PORTABLE CHEST 1 VIEW COMPARISON:  07/29/2022 FINDINGS: 0451 hours. Endotracheal tube tip is 1.3 cm above the base of the carina. The NG tube passes into the stomach although the distal tip position is not included on the film. The cardio pericardial silhouette is enlarged. Vascular congestion noted with underlying chronic interstitial coarsening. Mild bibasilar atelectasis noted without substantial pleural effusion. Telemetry leads overlie the chest. IMPRESSION: Endotracheal tube tip is 1.3 cm above the base of the carina. NG tube passes into the stomach. Cardiomegaly with vascular congestion and basilar atelectasis. Electronically Signed   By: Misty Stanley M.D.   On: 07/29/2022 05:30   CT Angio Chest/Abd/Pel for Dissection W and/or Wo Contrast  Addendum Date: 07/29/2022   ADDENDUM REPORT: 07/29/2022 05:09 ADDENDUM: ETT position and other salient findings discussed by telephone with Dr. Veryl Speak on 07/29/2022 at 0501 hours. Electronically Signed   By: Genevie Ann M.D.   On: 07/29/2022 05:09   Result Date: 07/29/2022 CLINICAL DATA:  71 year old female unresponsive at home. Altered mental status. Intubated. EXAM: CT ANGIOGRAPHY CHEST, ABDOMEN AND PELVIS TECHNIQUE: Non-contrast CT of the chest was initially obtained. Multidetector CT imaging through the chest,  abdomen and pelvis was performed using the standard protocol during bolus administration of intravenous contrast. Multiplanar reconstructed images and MIPs were obtained and reviewed to evaluate the vascular anatomy. RADIATION DOSE REDUCTION: This exam was performed according to the departmental dose-optimization program which includes automated exposure control, adjustment of the mA and/or kV according to patient size and/or use of iterative reconstruction technique. CONTRAST:  156m OMNIPAQUE IOHEXOL 350 MG/ML SOLN COMPARISON:  Portable chest  0347 hours today. CTA chest 01/06/2011. CT Abdomen and Pelvis 12/25/2021. FINDINGS: CTA CHEST FINDINGS Cardiovascular: Calcified aortic atherosclerosis. Calcified coronary artery atherosclerosis. Mild cardiomegaly appears stable since March. No pericardial effusion. Following contrast there is no thoracic aorta dissection. Incidental cardiac pulsation artifact. No thoracic aortic aneurysm. Also, the central pulmonary arteries are enhancing and appear to be patent. Mediastinum/Nodes: Negative. Lungs/Pleura: Intubated, endotracheal tube tip terminates just at the right mainstem bronchus on series 10, image 72. Atelectatic changes to the airways at the carina. Dependent, confluent pulmonary consolidation in both lower lobes, greater on the left. Early dependent upper lobe involvement. Small pulmonary arterial branches appear to be enhancing in the areas of consolidation. Superimposed perihilar and centrilobular pulmonary ground-glass opacity in both lungs with some septal thickening. Trace layering pleural effusions. Musculoskeletal: Respiratory motion at the sternum and ribs. No acute osseous abnormality identified. Review of the MIP images confirms the above findings. CTA ABDOMEN AND PELVIS FINDINGS VASCULAR Mild Aortoiliac calcified atherosclerosis. Negative for abdominal aortic aneurysm or dissection. Major arterial structures are patent. Portal venous system also appears to  be patent. Review of the MIP images confirms the above findings. NON-VASCULAR Hepatobiliary: Chronically absent gallbladder. Liver appears stable and negative when allowing for motion artifact. Pancreas: Stable, negative. Spleen: Within normal limits. Adrenals/Urinary Tract: Stable, negative adrenal glands. Asymmetric right renal cortical scarring and volume loss. Symmetric renal enhancement. Motion artifact. No convincing hydronephrosis or pararenal inflammation. Ureters appear decompressed. No obvious urinary calculus. Unremarkable bladder. Stomach/Bowel: Intermittent motion artifact. Redundant but decompressed large bowel except for retained stool in the transverse colon. Normal retrocecal appendix on series 6, image 152. No dilated small bowel. Small volume of air in fluid in the stomach. Negative duodenum. No free air, free fluid, or mesenteric inflammation identified. Lymphatic: No lymphadenopathy. Reproductive: Chronically absent uterus, diminutive or absent ovaries. Other: No pelvic free fluid. Musculoskeletal: No acute osseous abnormality identified. Review of the MIP images confirms the above findings. IMPRESSION: 1. Intubated, endotracheal tube tip terminates just inside the right mainstem bronchus. Recommend retracting 1 cm. 2. Negative for aortic dissection or aneurysm. Aortic Atherosclerosis (ICD10-I70.0). 3. Positive for dependent consolidation in both lungs, especially lower lobes greater on the left. This is most compatible with Pneumonia Versus Aspiration. 4. Trace layering pleural effusions and additional central pulmonary ground-glass opacity with septal thickening raising the possibility of mild or developing interstitial edema. Stable cardiomegaly. No pericardial effusion. 5. No acute or inflammatory process identified in the abdomen or pelvis. Chronic right renal cortical scarring. Electronically Signed: By: Genevie Ann M.D. On: 07/29/2022 04:57   CT Head Wo Contrast  Result Date:  07/29/2022 CLINICAL DATA:  71 year old female unresponsive at home. Altered mental status. Intubated. EXAM: CT HEAD WITHOUT CONTRAST TECHNIQUE: Contiguous axial images were obtained from the base of the skull through the vertex without intravenous contrast. RADIATION DOSE REDUCTION: This exam was performed according to the departmental dose-optimization program which includes automated exposure control, adjustment of the mA and/or kV according to patient size and/or use of iterative reconstruction technique. COMPARISON:  Head CT 08/03/2009. FINDINGS: Brain: Cerebral volume is within normal limits for age. No midline shift, ventriculomegaly, mass effect, evidence of mass lesion, intracranial hemorrhage or evidence of cortically based acute infarction. Gray-white matter differentiation is within normal limits for age. No convincing encephalomalacia. Vascular: Calcified atherosclerosis at the skull base. No suspicious intracranial vascular hyperdensity. Skull: Stable.  No acute osseous abnormality identified. Sinuses/Orbits: Visualized paranasal sinuses and mastoids are stable and well aerated. Other: Visualized orbits and scalp  soft tissues are within normal limits. IMPRESSION: Normal for age non contrast CT appearance of the brain, head. Electronically Signed   By: Genevie Ann M.D.   On: 07/29/2022 04:48   DG Chest Portable 1 View  Result Date: 07/29/2022 CLINICAL DATA:  71 year old female with shortness of breath. EXAM: PORTABLE CHEST 1 VIEW COMPARISON:  Chest radiographs 12/31/2011 and earlier. CT Abdomen and Pelvis 12/25/2021. FINDINGS: Portable AP semi upright view at 0347 hours. Cardiomegaly, new since 2013 but appears stable from the CT Abdomen and Pelvis this year. Other mediastinal contours are within normal limits. Visualized tracheal air column is within normal limits. Lung volumes are within normal limits. No pneumothorax, pleural effusion or consolidation. Mild streaky bilateral perihilar opacity.  Increased pulmonary vascularity from prior radiographs, but no overt edema. No acute osseous abnormality identified. Paucity of bowel gas in the upper abdomen. IMPRESSION: 1. Cardiomegaly with vascular congestion but no overt edema. 2. Streaky bilateral perihilar opacity, nonspecific but favor atelectasis. Electronically Signed   By: Genevie Ann M.D.   On: 07/29/2022 04:00    Barton Dubois, MD [facility]  If 7PM-7AM, please contact night-coverage [email or web address] Password [email or web address] 07/30/2022, 4:44 PM   LOS: 1 day

## 2022-07-31 ENCOUNTER — Inpatient Hospital Stay (HOSPITAL_COMMUNITY): Payer: Medicare Other

## 2022-07-31 DIAGNOSIS — I5021 Acute systolic (congestive) heart failure: Secondary | ICD-10-CM | POA: Diagnosis present

## 2022-07-31 DIAGNOSIS — R7989 Other specified abnormal findings of blood chemistry: Secondary | ICD-10-CM | POA: Diagnosis not present

## 2022-07-31 DIAGNOSIS — I5041 Acute combined systolic (congestive) and diastolic (congestive) heart failure: Secondary | ICD-10-CM

## 2022-07-31 DIAGNOSIS — A419 Sepsis, unspecified organism: Secondary | ICD-10-CM | POA: Diagnosis not present

## 2022-07-31 DIAGNOSIS — E1165 Type 2 diabetes mellitus with hyperglycemia: Secondary | ICD-10-CM | POA: Diagnosis not present

## 2022-07-31 DIAGNOSIS — J9602 Acute respiratory failure with hypercapnia: Secondary | ICD-10-CM | POA: Diagnosis not present

## 2022-07-31 DIAGNOSIS — J9601 Acute respiratory failure with hypoxia: Secondary | ICD-10-CM | POA: Diagnosis not present

## 2022-07-31 LAB — BLOOD GAS, VENOUS
Acid-Base Excess: 3.9 mmol/L — ABNORMAL HIGH (ref 0.0–2.0)
Bicarbonate: 30.2 mmol/L — ABNORMAL HIGH (ref 20.0–28.0)
Drawn by: 1391
O2 Saturation: 26.8 %
Patient temperature: 38.4
pCO2, Ven: 54 mmHg (ref 44–60)
pH, Ven: 7.36 (ref 7.25–7.43)
pO2, Ven: <31 mmHg — CL (ref 32–45)

## 2022-07-31 LAB — GLUCOSE, CAPILLARY
Glucose-Capillary: 112 mg/dL — ABNORMAL HIGH (ref 70–99)
Glucose-Capillary: 98 mg/dL (ref 70–99)
Glucose-Capillary: 151 mg/dL — ABNORMAL HIGH (ref 70–99)
Glucose-Capillary: 146 mg/dL — ABNORMAL HIGH (ref 70–99)
Glucose-Capillary: 140 mg/dL — ABNORMAL HIGH (ref 70–99)

## 2022-07-31 LAB — BASIC METABOLIC PANEL
Anion gap: 11 (ref 5–15)
BUN: 11 mg/dL (ref 8–23)
CO2: 21 mmol/L — ABNORMAL LOW (ref 22–32)
Calcium: 8.8 mg/dL — ABNORMAL LOW (ref 8.9–10.3)
Chloride: 109 mmol/L (ref 98–111)
Creatinine, Ser: 1.14 mg/dL — ABNORMAL HIGH (ref 0.44–1.00)
GFR, Estimated: 52 mL/min — ABNORMAL LOW (ref >60)
Glucose, Bld: 133 mg/dL — ABNORMAL HIGH (ref 70–99)
Potassium: 4.2 mmol/L (ref 3.5–5.1)
Sodium: 141 mmol/L (ref 135–145)

## 2022-07-31 LAB — BASIC METABOLIC PANEL WITH GFR
Anion gap: 10 (ref 5–15)
BUN: 11 mg/dL (ref 8–23)
CO2: 25 mmol/L (ref 22–32)
Calcium: 8.9 mg/dL (ref 8.9–10.3)
Chloride: 105 mmol/L (ref 98–111)
Creatinine, Ser: 1.06 mg/dL — ABNORMAL HIGH (ref 0.44–1.00)
GFR, Estimated: 57 mL/min — ABNORMAL LOW
Glucose, Bld: 151 mg/dL — ABNORMAL HIGH (ref 70–99)
Potassium: 2.8 mmol/L — ABNORMAL LOW (ref 3.5–5.1)
Sodium: 140 mmol/L (ref 135–145)

## 2022-07-31 LAB — URINE CULTURE: Culture: NO GROWTH

## 2022-07-31 LAB — MAGNESIUM
Magnesium: 2.1 mg/dL (ref 1.7–2.4)
Magnesium: 2.1 mg/dL (ref 1.7–2.4)

## 2022-07-31 LAB — PROCALCITONIN: Procalcitonin: 1.83 ng/mL

## 2022-07-31 MED ORDER — KETOROLAC TROMETHAMINE 15 MG/ML IJ SOLN
15.0000 mg | Freq: Once | INTRAMUSCULAR | Status: AC
  Administered 2022-07-31: 15 mg via INTRAVENOUS
  Filled 2022-07-31: qty 1

## 2022-07-31 MED ORDER — ACETAMINOPHEN 650 MG RE SUPP
650.0000 mg | RECTAL | Status: AC | PRN
  Administered 2022-07-31: 650 mg via RECTAL

## 2022-07-31 MED ORDER — ACETAMINOPHEN 650 MG RE SUPP
RECTAL | Status: AC
  Filled 2022-07-31: qty 1

## 2022-07-31 MED ORDER — FUROSEMIDE 10 MG/ML IJ SOLN
40.0000 mg | Freq: Every day | INTRAMUSCULAR | Status: DC
  Administered 2022-08-01 – 2022-08-02 (×2): 40 mg via INTRAVENOUS
  Filled 2022-07-31 (×2): qty 4

## 2022-07-31 MED ORDER — POTASSIUM CHLORIDE 10 MEQ/100ML IV SOLN
10.0000 meq | INTRAVENOUS | Status: AC
  Administered 2022-07-31 – 2022-08-01 (×6): 10 meq via INTRAVENOUS
  Filled 2022-07-31 (×3): qty 100

## 2022-07-31 MED ORDER — METOPROLOL TARTRATE 25 MG PO TABS
25.0000 mg | ORAL_TABLET | Freq: Two times a day (BID) | ORAL | Status: DC
  Administered 2022-07-31 – 2022-08-01 (×2): 25 mg
  Filled 2022-07-31 (×2): qty 1

## 2022-07-31 MED ORDER — ONDANSETRON HCL 4 MG/2ML IJ SOLN
4.0000 mg | Freq: Four times a day (QID) | INTRAMUSCULAR | Status: DC | PRN
  Administered 2022-07-31: 4 mg via INTRAVENOUS
  Filled 2022-07-31: qty 2

## 2022-07-31 NOTE — Progress Notes (Signed)
PROGRESS NOTE  RICHA SHOR GYF:[id number] DOB: 1951-10-13 DOA: 07/29/2022 PCP: Vidal Schwalbe, MD  Brief History:  71 year old female with a history of hypertension, hyperlipidemia, left bundle branch block, migraine headache presenting with respiratory distress.  The patient is currently intubated and sedated.  History is obtained from speaking with the patient's son at the bedside and review of the medical record.  Apparently, the patient had been in her usual state of health up until the early morning 07/29/2022.  The patient woke up with shortness of breath.  She was brought to the emergency department by her son.  Upon arrival, the patient was noted to be obtunded in respiratory distress with saturation 48% on room air.  She was placed on nonrebreather with improvement to 64%.  She was subsequently intubated. Son and daughter at the bedside stated that the patient had been in her usual state of health without any complaints up until the time of admission.  She had not had any fevers, chills, headache, chest pain, shortness of breath, coughing, hemoptysis, nausea, vomiting. Notably, the patient has had chronic diarrhea for which she was seeing GI at [facility].  It was felt to be due to functional diarrhea versus IBS versus microscopic colitis with a component of biliary diarrhea.  She was last seen by GI at [facility] on 11/28/2019, and has been lost to follow-up since then.  There have been no reports of hematochezia or melena per the patient's family.  There is been no new medications.  The patient has not had any travels.  She has not had any sick contacts. In the ED, the patient was afebrile and hemodynamically stable.  She was intubated and sedated on the ventilator.  WBC 25.0, hemoglobin 14.1, platelets 254,000.  Sodium 141, potassium 3.6, CO2 20, serum creatinine 0.94.  AST 36, ALT 33, alk phosphatase 87, total bilirubin 0.7.  BNP 1157.  Troponin 17>> 128.  CT a chest was negative for pulmonary  embolus or dissection.  There was bilateral lower lobe consolidation, left greater than right with early upper lobe involvement.  There was superimposed perihilar and centrilobular pulmonary GGO with septal thickening.  CT of the abdomen and pelvis showed right renal cortical scarring.  There was retained stool in the transverse colon.  Otherwise there is no other acute intra-abdominal abnormalities.  The patient was started on vancomycin and Zosyn.    Assessment and Plan: * Sepsis due to undetermined organism ([place]) -Secondary to pneumonia -Sepsis present at time of admission with leukocytosis, tachycardia, respiratory distress -Procalcitonin 0.11>>3.19; continue to follow trend.  Procalcitonin 1.83 -Follow culture result -Continue treatment with Zosyn and Zithromax  -Patient is still spiking fever -Continue as needed antipyretics and cooling blankets.   -Continue supportive care. -Fever curve trending down.   Acute respiratory failure with hypoxia and hypercarbia (HCC) -Secondary to pneumonia with component of fluid overload -Remains intubated -PCCM consulted and assisting with management and ventilator care. -Continue sedation holidays with plans for SBT later today. -Chest x-ray demonstrated improvement in patient vascular congestion status. -Continue current antibiotics (Zosyn and Zithromax); vancomycin DC in the setting of negative MRSA PCR.  Lobar pneumonia (HCC) -Procalcitonin 0.11, and adequately trending down -MRSA PCR negative; vancomycin discontinued -Continue Zosyn and Zithromax -Continue supportive care and follow culture results. -COVID and influenza PCR negative. -Wean off oxygen supplementation as tolerated -Planning for SBT's and holiday sedation throughout the day.   Elevated troponin -Secondary to demand ischemia and the  presence of acute systolic heart failure. -EKG without acute ischemic changes -2D echo demonstrating ejection fraction around 20% -Cardiology  service consultation appreciated; feel patient experiencing nonischemic cardiomyopathy process in the setting of sepsis. -Lasix and metoprolol has been initiated. -Will follow recommendations for further GDMT adjustments..   Elevated brain natriuretic peptide (BNP) level -BNP 1157 -Vascular congestion and interstitial edema appreciated on chest images at time of admission. -Repeat chest x-ray demonstrated improvement in patient's vascular congestion.  Good urine output reported. -2D echo demonstrating acute systolic heart failure -Appreciate assistance and recommendation by cardiology service. -Continue to follow daily weights/strict intake and output -Continue metoprolol and Lasix.  Acute combined systolic and diastolic heart failure ([place]) - What appeared to be nonischemic cardiomyopathy in the setting of sepsis -Continue to follow cardiology service recommendation -Follow daily weight and strict I's and O -Continue to maintain negative balance and continue the use of metoprolol.  Hypokalemia -In the setting of diuresis -Continue to replete electrolytes as needed; follow trend. -Continue telemetry monitoring.  Controlled type 2 diabetes mellitus with hyperglycemia ([place]) -Updated hemoglobin A1c 6.3 -Patient is not on any agents in the outpatient setting -Continue sliding scale insulin while inpatient -Follow CBGs fluctuation -Lifestyle changes and modified carbohydrate diet to be resumed as an outpatient (patient with prediabetes parameters currently).  Essential hypertension -Continue holding losartan temporarily due to soft blood pressures after intubation -Also needing blood pressure stability for treatment with beta-blocker and diuretics. -Follow vital signs.   Family Communication:   son and daughter at bedside 07/31/2022.  Consultants:  PCCM, cardiology service  Code Status:  FULL /  DVT Prophylaxis: Lucas Lovenox   Procedures: As Listed in Progress Note  Above  Antibiotics: Vanc 10/10>>10/11 (MRSA PCR negative) Zosyn 10/10>> Azithro 10/10>>  Subjective: Good urine output; still spiking low-grade temperature with a Tmax of 101.1.  Remains sedated, intubated and mechanically ventilated.  Pursuing sedation holiday with plan for SBT's.  Objective: Vitals:   07/31/22 1400 07/31/22 1500 07/31/22 1600 07/31/22 1622  BP: 116/67 (!) 122/51 (!) 126/47   Pulse: 87 88 87   Resp: (!) 21 (!) 25 (!) 22   Temp: (!) 101.1 F (38.4 C) (!) 100.9 F (38.3 C) (!) 100.9 F (38.3 C) (!) 101.1 F (38.4 C)  TempSrc:    Bladder  SpO2: 95% 95% 97%   Weight:      Height:        Intake/Output Summary (Last 24 hours) at 07/31/2022 1828 Last data filed at 07/31/2022 1700 Gross per 24 hour  Intake 911.57 ml  Output 2275 ml  Net -1363.43 ml   Weight change:  Exam: General exam: Sedated, intubated and mechanically ventilated; still spiking fever.  Currently pursuing sedation holiday with intention for SBT. Respiratory system: Good air movement bilaterally; decreased breath sounds at the bases.  No wheezing, no crackles. Cardiovascular system: Rate controlled, no rubs or gallops; positive murmur.  No JVD on exam. Gastrointestinal system: Abdomen is nondistended, soft and nontender. No organomegaly or masses felt. Normal bowel sounds heard. Central nervous system: Unable to properly assess currently due to sedation Extremities: No cyanosis or clubbing.  No edema. Skin: No petechiae. Psychiatry: Unable to properly assess due to sedation at this time.  Data Reviewed: I have personally reviewed following labs and imaging studies  Basic Metabolic Panel: Recent Labs  Lab 07/29/22 0345 07/29/22 0351 07/29/22 1327 07/30/22 0434 07/31/22 0443  NA 141 143  --  142 141  K 3.6 3.7  --  3.2* 4.2  CL 109 109  --  110 109  CO2 20*  --   --  24 21*  GLUCOSE 253* 248*  --  145* 133*  BUN 16 16  --  12 11  CREATININE 0.94 0.90  --  1.10* 1.14*  CALCIUM 9.4   --   --  8.9 8.8*  MG  --   --  2.0 1.9 2.1  PHOS  --   --  3.7  --   --    Liver Function Tests: Recent Labs  Lab 07/29/22 0345 07/30/22 0434  AST 36 52*  ALT 33 51*  ALKPHOS 87 71  BILITOT 0.7 0.9  PROT 7.7 6.0*  ALBUMIN 4.1 3.3*   CBC: Recent Labs  Lab 07/29/22 0345 07/29/22 0351 07/30/22 0434  WBC 25.0*  --  14.6*  NEUTROABS 14.3*  --   --   HGB 14.1 14.3 11.9*  HCT 43.7 42.0 36.4  MCV 89.0  --  87.9  PLT 254  --  175   CBG: Recent Labs  Lab 07/30/22 2153 07/31/22 0438 07/31/22 0725 07/31/22 1135 07/31/22 1615  GLUCAP 160* 112* 98 151* 146*   HbA1C: Recent Labs    07/29/22 0623  HGBA1C 6.3*   Urine analysis:    Component Value Date/Time   COLORURINE STRAW (A) 07/29/2022 0730   APPEARANCEUR CLEAR 07/29/2022 0730   APPEARANCEUR Clear 11/20/2017 1200   LABSPEC 1.027 07/29/2022 0730   PHURINE 6.0 07/29/2022 0730   GLUCOSEU >=500 (A) 07/29/2022 0730   HGBUR NEGATIVE 07/29/2022 0730   BILIRUBINUR NEGATIVE 07/29/2022 0730   BILIRUBINUR Negative 11/20/2017 1200   KETONESUR NEGATIVE 07/29/2022 0730   PROTEINUR 100 (A) 07/29/2022 0730   UROBILINOGEN 0.2 11/14/2010 1850   NITRITE NEGATIVE 07/29/2022 0730   LEUKOCYTESUR NEGATIVE 07/29/2022 0730   Sepsis Labs:  Recent Results (from the past 240 hour(s))  Blood culture (routine x 2)     Status: None (Preliminary result)   Collection Time: 07/29/22  6:17 AM   Specimen: BLOOD RIGHT ARM  Result Value Ref Range Status   Specimen Description BLOOD RIGHT ARM  Final   Special Requests   Final    BOTTLES DRAWN AEROBIC AND ANAEROBIC Blood Culture adequate volume   Culture   Final    NO GROWTH 2 DAYS Performed at [facility], [address] [place], [place] [address]    Report Status PENDING  Incomplete  Blood culture (routine x 2)     Status: None (Preliminary result)   Collection Time: 07/29/22  6:23 AM   Specimen: BLOOD LEFT ARM  Result Value Ref Range Status   Specimen Description BLOOD LEFT ARM   Final   Special Requests   Final    BOTTLES DRAWN AEROBIC AND ANAEROBIC Blood Culture adequate volume   Culture   Final    NO GROWTH 2 DAYS Performed at [facility], [address] [place], [place] [address]    Report Status PENDING  Incomplete  MRSA Next Gen by PCR, Nasal     Status: None   Collection Time: 07/29/22  7:29 AM   Specimen: Anterior Nasal Swab  Result Value Ref Range Status   MRSA by PCR Next Gen NOT DETECTED NOT DETECTED Final    Comment: (NOTE) The GeneXpert MRSA Assay (FDA approved for NASAL specimens only), is one component of a comprehensive MRSA colonization surveillance program. It is not intended to diagnose MRSA infection nor to guide or monitor treatment for MRSA infections. Test performance is not  FDA approved in patients less than 64 years old. Performed at [facility], [address], [place], [place] [address]   SARS Coronavirus 2 by RT PCR (hospital order, performed in [facility] hospital lab) *cepheid single result test* Anterior Nasal Swab     Status: None   Collection Time: 07/29/22  7:29 AM   Specimen: Anterior Nasal Swab  Result Value Ref Range Status   SARS Coronavirus 2 by RT PCR NEGATIVE NEGATIVE Final    Comment: (NOTE) SARS-CoV-2 target nucleic acids are NOT DETECTED.  The SARS-CoV-2 RNA is generally detectable in upper and lower respiratory specimens during the acute phase of infection. The lowest concentration of SARS-CoV-2 viral copies this assay can detect is 250 copies / mL. A negative result does not preclude SARS-CoV-2 infection and should not be used as the sole basis for treatment or other patient management decisions.  A negative result may occur with improper specimen collection / handling, submission of specimen other than nasopharyngeal swab, presence of viral mutation(s) within the areas targeted by this assay, and inadequate number of viral copies (<250 copies / mL). A negative result must be combined with  clinical observations, patient history, and epidemiological information.  Fact Sheet for Patients:   https://www.patel.info/  Fact Sheet for Healthcare Providers: https://hall.com/  This test is not yet approved or  cleared by the [place] FDA and has been authorized for detection and/or diagnosis of SARS-CoV-2 by FDA under an Emergency Use Authorization (EUA).  This EUA will remain in effect (meaning this test can be used) for the duration of the COVID-19 declaration under Section 564(b)(1) of the Act, 21 U.S.C. section 360bbb-3(b)(1), unless the authorization is terminated or revoked sooner.  Performed at [facility], [address], [place], [place] [address]   Urine Culture     Status: None   Collection Time: 07/29/22  7:30 AM   Specimen: Urine, Catheterized  Result Value Ref Range Status   Specimen Description   Final    URINE, CATHETERIZED Performed at [facility], [address], [place], [place] [address]    Special Requests   Final    NONE Performed at [facility], [address] [place], [place] [address]    Culture   Final    NO GROWTH Performed at [facility], [place] [address], [place], [place] [address]    Report Status 07/31/2022 FINAL  Final  Culture, Respiratory w Gram Stain     Status: None (Preliminary result)   Collection Time: 07/30/22  3:00 AM   Specimen: Tracheal Aspirate; Respiratory  Result Value Ref Range Status   Specimen Description   Final    TRACHEAL ASPIRATE Performed at [facility], [address] [place], [place] [address]    Special Requests   Final    NONE Performed at [place] [place], [place] [address]    Gram Stain   Final    MODERATE WBC PRESENT, PREDOMINANTLY PMN RARE GRAM POSITIVE COCCI IN PAIRS    Culture   Final    CULTURE REINCUBATED FOR BETTER GROWTH Performed at [facility], [place] [address], [place],  [address]    Report Status  PENDING  Incomplete     Scheduled Meds:  atorvastatin  40 mg Per Tube Daily   Chlorhexidine Gluconate Cloth  6 each Topical Q0600   enoxaparin (LOVENOX) injection  40 mg Subcutaneous Q24H   [START ON 08/01/2022] furosemide  40 mg Intravenous Daily   metoprolol  tartrate  25 mg Per Tube BID   pantoprazole (PROTONIX) IV  40 mg Intravenous Q24H   Continuous Infusions:  azithromycin Stopped (07/31/22 1045)   dextrose 75 mL/hr at 07/31/22 1555   fentaNYL infusion INTRAVENOUS Stopped (07/31/22 0853)   piperacillin-tazobactam (ZOSYN)  IV Stopped (07/31/22 1545)   propofol (DIPRIVAN) infusion Stopped (07/31/22 0730)    Procedures/Studies: DG Chest Port 1 View  Result Date: 07/31/2022 CLINICAL DATA:  Acute respiratory failure EXAM: PORTABLE CHEST 1 VIEW COMPARISON:  Chest 07/30/2022 FINDINGS: Endotracheal tube 2.5 cm above the carina. NG tube in the stomach with the tip not visualized. Left lower lobe consolidation with mild progression. Mild right lower lobe airspace disease with progression. IMPRESSION: 1. Endotracheal tube in good position. 2. Progression of bibasilar airspace disease left greater than right. Electronically Signed   By: Franchot Gallo M.D.   On: 07/31/2022 14:01   DG Chest Port 1 View  Result Date: 07/30/2022 CLINICAL DATA:  Acute on chronic respiratory failure with hypoxemia. EXAM: PORTABLE CHEST 1 VIEW COMPARISON:  07/29/2022 FINDINGS: ET tube tip is above the carina. Enteric tube tip courses below the level of the hemidiaphragms. Unchanged asymmetric elevation of the right hemidiaphragm. Persistent airspace disease within the left lower lobe which appears unchanged from the previous exam. Decreased interstitial edema pattern. IMPRESSION: 1. Stable support apparatus. 2. Persistent left lower lobe airspace disease. 3. Decreased interstitial edema pattern. Electronically Signed   By: Kerby Moors M.D.   On: 07/30/2022 08:54   ECHOCARDIOGRAM COMPLETE  Result Date:  07/29/2022    ECHOCARDIOGRAM REPORT   Patient Name:   ANNORA GUDERIAN Rigg Date of Exam: 07/29/2022 Medical Rec #:  [id number]     Height:       64.0 in Accession #:    [id number]    Weight:       145.7 lb Date of Birth:  November 09, 1950     BSA:          1.710 m Patient Age:    41 years      BP:           102/69 mmHg Patient Gender: F             HR:           95 bpm. Exam Location:  [facility] Procedure: 2D Echo, Color Doppler, Cardiac Doppler and Intracardiac            Opacification Agent Indications:    P59.16 Acute systolic (congestive) heart failure  History:        Patient has prior history of Echocardiogram examinations, most                 recent 01/08/2011. Arrythmias:LBBB; Risk Factors:Hypertension and                 Diabetes.  Sonographer:    Raquel Sarna Senior RDCS Referring Phys: [id number] OLADAPO ADEFESO  Sonographer Comments: Scanned supine on artificial respirator. IMPRESSIONS  1. Left ventricular ejection fraction, by estimation, is <20%. The left ventricle has severely decreased function. The left ventricle demonstrates global hypokinesis. The left ventricular internal cavity size was moderately dilated. Left ventricular diastolic parameters are consistent with Grade III diastolic dysfunction (restrictive).  2. Right ventricular systolic function is normal. The right ventricular size is normal.  3. The mitral valve is normal in structure. Mild to moderate mitral valve regurgitation. No evidence of mitral stenosis.  4. The aortic valve is tricuspid. Aortic valve regurgitation is not visualized. No aortic  stenosis is present.  5. The inferior vena cava is normal in size with greater than 50% respiratory variability, suggesting right atrial pressure of 3 mmHg. FINDINGS  Left Ventricle: Left ventricular ejection fraction, by estimation, is <20%. The left ventricle has severely decreased function. The left ventricle demonstrates global hypokinesis. Definity contrast agent was given IV to delineate the left ventricular  endocardial borders. The left ventricular internal cavity size was moderately dilated. There is no left ventricular hypertrophy. Abnormal (paradoxical) septal motion, consistent with left bundle branch block. Left ventricular diastolic parameters are consistent with Grade III diastolic dysfunction (restrictive). Right Ventricle: The right ventricular size is normal. No increase in right ventricular wall thickness. Right ventricular systolic function is normal. Left Atrium: Left atrial size was normal in size. Right Atrium: Right atrial size was normal in size. Pericardium: There is no evidence of pericardial effusion. Mitral Valve: The mitral valve is normal in structure. Mild to moderate mitral valve regurgitation. No evidence of mitral valve stenosis. Tricuspid Valve: The tricuspid valve is normal in structure. Tricuspid valve regurgitation is not demonstrated. No evidence of tricuspid stenosis. Aortic Valve: The aortic valve is tricuspid. Aortic valve regurgitation is not visualized. No aortic stenosis is present. Pulmonic Valve: The pulmonic valve was normal in structure. Pulmonic valve regurgitation is mild. No evidence of pulmonic stenosis. Aorta: The aortic root is normal in size and structure. Venous: The inferior vena cava is normal in size with greater than 50% respiratory variability, suggesting right atrial pressure of 3 mmHg. IAS/Shunts: No atrial level shunt detected by color flow Doppler.  LEFT VENTRICLE PLAX 2D LVIDd:         5.90 cm      Diastology LVIDs:         4.50 cm      LV e' medial:    4.90 cm/s LV PW:         1.00 cm      LV E/e' medial:  20.8 LV IVS:        0.90 cm      LV e' lateral:   7.94 cm/s LVOT diam:     1.90 cm      LV E/e' lateral: 12.8 LV SV:         30 LV SV Index:   18 LVOT Area:     2.84 cm  LV Volumes (MOD) LV vol d, MOD A2C: 131.0 ml LV vol d, MOD A4C: 114.0 ml LV vol s, MOD A2C: 114.0 ml LV vol s, MOD A4C: 102.0 ml LV SV MOD A2C:     17.0 ml LV SV MOD A4C:     114.0 ml LV  SV MOD BP:      14.8 ml RIGHT VENTRICLE RV S prime:     9.03 cm/s TAPSE (M-mode): 1.9 cm LEFT ATRIUM             Index        RIGHT ATRIUM           Index LA diam:        4.00 cm 2.34 cm/m   RA Area:     12.70 cm LA Vol (A2C):   56.3 ml 32.92 ml/m  RA Volume:   26.30 ml  15.38 ml/m LA Vol (A4C):   39.7 ml 23.22 ml/m LA Biplane Vol: 48.8 ml 28.54 ml/m  AORTIC VALVE LVOT Vmax:   92.70 cm/s LVOT Vmean:  62.300 cm/s LVOT VTI:    0.106 m  AORTA Ao Root diam:  3.00 cm Ao Asc diam:  3.30 cm MITRAL VALVE MV Area (PHT): 5.50 cm     SHUNTS MV Decel Time: 138 msec     Systemic VTI:  0.11 m MV E velocity: 102.00 cm/s  Systemic Diam: 1.90 cm MV A velocity: 30.00 cm/s MV E/A ratio:  3.40 Candee Furbish MD Electronically signed by Candee Furbish MD Signature Date/Time: 07/29/2022/10:33:16 AM    Final    DG Chest Portable 1 View  Result Date: 07/29/2022 CLINICAL DATA:  NG tube placement. EXAM: PORTABLE CHEST 1 VIEW COMPARISON:  07/29/2022 FINDINGS: 0451 hours. Endotracheal tube tip is 1.3 cm above the base of the carina. The NG tube passes into the stomach although the distal tip position is not included on the film. The cardio pericardial silhouette is enlarged. Vascular congestion noted with underlying chronic interstitial coarsening. Mild bibasilar atelectasis noted without substantial pleural effusion. Telemetry leads overlie the chest. IMPRESSION: Endotracheal tube tip is 1.3 cm above the base of the carina. NG tube passes into the stomach. Cardiomegaly with vascular congestion and basilar atelectasis. Electronically Signed   By: Misty Stanley M.D.   On: 07/29/2022 05:30   CT Angio Chest/Abd/Pel for Dissection W and/or Wo Contrast  Addendum Date: 07/29/2022   ADDENDUM REPORT: 07/29/2022 05:09 ADDENDUM: ETT position and other salient findings discussed by telephone with Dr. Veryl Speak on 07/29/2022 at 0501 hours. Electronically Signed   By: Genevie Ann M.D.   On: 07/29/2022 05:09   Result Date: 07/29/2022 CLINICAL  DATA:  71 year old female unresponsive at home. Altered mental status. Intubated. EXAM: CT ANGIOGRAPHY CHEST, ABDOMEN AND PELVIS TECHNIQUE: Non-contrast CT of the chest was initially obtained. Multidetector CT imaging through the chest, abdomen and pelvis was performed using the standard protocol during bolus administration of intravenous contrast. Multiplanar reconstructed images and MIPs were obtained and reviewed to evaluate the vascular anatomy. RADIATION DOSE REDUCTION: This exam was performed according to the departmental dose-optimization program which includes automated exposure control, adjustment of the mA and/or kV according to patient size and/or use of iterative reconstruction technique. CONTRAST:  130m OMNIPAQUE IOHEXOL 350 MG/ML SOLN COMPARISON:  Portable chest 0347 hours today. CTA chest 01/06/2011. CT Abdomen and Pelvis 12/25/2021. FINDINGS: CTA CHEST FINDINGS Cardiovascular: Calcified aortic atherosclerosis. Calcified coronary artery atherosclerosis. Mild cardiomegaly appears stable since March. No pericardial effusion. Following contrast there is no thoracic aorta dissection. Incidental cardiac pulsation artifact. No thoracic aortic aneurysm. Also, the central pulmonary arteries are enhancing and appear to be patent. Mediastinum/Nodes: Negative. Lungs/Pleura: Intubated, endotracheal tube tip terminates just at the right mainstem bronchus on series 10, image 72. Atelectatic changes to the airways at the carina. Dependent, confluent pulmonary consolidation in both lower lobes, greater on the left. Early dependent upper lobe involvement. Small pulmonary arterial branches appear to be enhancing in the areas of consolidation. Superimposed perihilar and centrilobular pulmonary ground-glass opacity in both lungs with some septal thickening. Trace layering pleural effusions. Musculoskeletal: Respiratory motion at the sternum and ribs. No acute osseous abnormality identified. Review of the MIP images  confirms the above findings. CTA ABDOMEN AND PELVIS FINDINGS VASCULAR Mild Aortoiliac calcified atherosclerosis. Negative for abdominal aortic aneurysm or dissection. Major arterial structures are patent. Portal venous system also appears to be patent. Review of the MIP images confirms the above findings. NON-VASCULAR Hepatobiliary: Chronically absent gallbladder. Liver appears stable and negative when allowing for motion artifact. Pancreas: Stable, negative. Spleen: Within normal limits. Adrenals/Urinary Tract: Stable, negative adrenal glands. Asymmetric right renal cortical scarring and volume loss. Symmetric  renal enhancement. Motion artifact. No convincing hydronephrosis or pararenal inflammation. Ureters appear decompressed. No obvious urinary calculus. Unremarkable bladder. Stomach/Bowel: Intermittent motion artifact. Redundant but decompressed large bowel except for retained stool in the transverse colon. Normal retrocecal appendix on series 6, image 152. No dilated small bowel. Small volume of air in fluid in the stomach. Negative duodenum. No free air, free fluid, or mesenteric inflammation identified. Lymphatic: No lymphadenopathy. Reproductive: Chronically absent uterus, diminutive or absent ovaries. Other: No pelvic free fluid. Musculoskeletal: No acute osseous abnormality identified. Review of the MIP images confirms the above findings. IMPRESSION: 1. Intubated, endotracheal tube tip terminates just inside the right mainstem bronchus. Recommend retracting 1 cm. 2. Negative for aortic dissection or aneurysm. Aortic Atherosclerosis (ICD10-I70.0). 3. Positive for dependent consolidation in both lungs, especially lower lobes greater on the left. This is most compatible with Pneumonia Versus Aspiration. 4. Trace layering pleural effusions and additional central pulmonary ground-glass opacity with septal thickening raising the possibility of mild or developing interstitial edema. Stable cardiomegaly. No  pericardial effusion. 5. No acute or inflammatory process identified in the abdomen or pelvis. Chronic right renal cortical scarring. Electronically Signed: By: Genevie Ann M.D. On: 07/29/2022 04:57   CT Head Wo Contrast  Result Date: 07/29/2022 CLINICAL DATA:  71 year old female unresponsive at home. Altered mental status. Intubated. EXAM: CT HEAD WITHOUT CONTRAST TECHNIQUE: Contiguous axial images were obtained from the base of the skull through the vertex without intravenous contrast. RADIATION DOSE REDUCTION: This exam was performed according to the departmental dose-optimization program which includes automated exposure control, adjustment of the mA and/or kV according to patient size and/or use of iterative reconstruction technique. COMPARISON:  Head CT 08/03/2009. FINDINGS: Brain: Cerebral volume is within normal limits for age. No midline shift, ventriculomegaly, mass effect, evidence of mass lesion, intracranial hemorrhage or evidence of cortically based acute infarction. Gray-white matter differentiation is within normal limits for age. No convincing encephalomalacia. Vascular: Calcified atherosclerosis at the skull base. No suspicious intracranial vascular hyperdensity. Skull: Stable.  No acute osseous abnormality identified. Sinuses/Orbits: Visualized paranasal sinuses and mastoids are stable and well aerated. Other: Visualized orbits and scalp soft tissues are within normal limits. IMPRESSION: Normal for age non contrast CT appearance of the brain, head. Electronically Signed   By: Genevie Ann M.D.   On: 07/29/2022 04:48   DG Chest Portable 1 View  Result Date: 07/29/2022 CLINICAL DATA:  71 year old female with shortness of breath. EXAM: PORTABLE CHEST 1 VIEW COMPARISON:  Chest radiographs 12/31/2011 and earlier. CT Abdomen and Pelvis 12/25/2021. FINDINGS: Portable AP semi upright view at 0347 hours. Cardiomegaly, new since 2013 but appears stable from the CT Abdomen and Pelvis this year. Other  mediastinal contours are within normal limits. Visualized tracheal air column is within normal limits. Lung volumes are within normal limits. No pneumothorax, pleural effusion or consolidation. Mild streaky bilateral perihilar opacity. Increased pulmonary vascularity from prior radiographs, but no overt edema. No acute osseous abnormality identified. Paucity of bowel gas in the upper abdomen. IMPRESSION: 1. Cardiomegaly with vascular congestion but no overt edema. 2. Streaky bilateral perihilar opacity, nonspecific but favor atelectasis. Electronically Signed   By: Genevie Ann M.D.   On: 07/29/2022 04:00    Barton Dubois, MD [facility]  If 7PM-7AM, please contact night-coverage [email or web address] Password [email or web address] 07/31/2022, 6:28 PM   LOS: 2 days

## 2022-07-31 NOTE — Progress Notes (Signed)
At 1330 RT extubated patient to 4L O2 via nasal cannula. Patient tolerated well, maintaining SATs 98%, HR 90 and rRR 25 and BBS diminished. RN at bedside, no complications noted. RT will continue to monitor and assess.

## 2022-07-31 NOTE — Assessment & Plan Note (Addendum)
Echocardiogram with reduced LV systolic function EF <97%, with global hypokinesis. RV systolic function preserved.  No significant valvular disease.  -Good urine output reported; stable renal function. -Continue oral Lasix (40 mg daily), continue Farxiga, carvedilol, spironolactone and ARB -Close monitoring of renal function -Low-sodium diet and adequate hydration discussed with patient. -Continue daily weights and strict I's and O's. -Patient will need ischemic work up for new systolic heart failure, will follow cardiology recommendations on Monday.

## 2022-07-31 NOTE — Progress Notes (Signed)
NAME:  Summer Christensen, MRN:  [id number], DOB:  1951/03/07, LOS: 2 ADMISSION DATE:  07/29/2022, CONSULTATION DATE:  07/29/22  REFERRING MD:  Tat, CHIEF COMPLAINT:  acute resp failure   History of Present Illness:  75 yobf with medical history significant of hypertension on ACEi, hyperlipidemia who presented to the emergency department early am 10/10   by private vehicle due to shortness of breath.  Patient was already intubated and sedated, so she was unable to provide history, history was provided dated by son.  Per son, patient woke up in the course of the night complaining of not being able to breathe and asked him to take her to the ER.  By the time patient arrived at the ER, she was altered, obtunded and was in severe respiratory distress.  O2 sat was noted to be 40% on arrival to the ED.   ED Course:  In the emergency department, she was tachycardic, tachypneic and BP was 200/108, temperature was 98.67F and O2 sat was 95% after being intubated.  Work-up in the ED showed normal CBC except for WBC of 25.  BNP  1157 BMP was normal except for bicarb of 20 and blood glucose of 253.  Troponin x1 was 17.  VBG done on arrival showed pH of 7.11 with PCO2 of 67, patient was intubated and sedated and subsequent ABG showed an improvement with a pH of 7.24, PCO2 of 53 and PO2 of 100. CT angiography chest, abdomen and pelvis was suggestive of pneumonia versus aspiration.  Trace layering pleural effusions and additional central pulmonary groundglass opacity with septal thickening raising the possibility of mild or develop interstitial edema.  Stable cardiomegaly.  No pericardial effusion Patient was started on IV Vanco and Zosyn  Baseline per son Haywood Lasso :  fully independent/ ambulatory, did gardening and felt fine when went to bed 10/9  - ate Tolap[place] for supper, no gi c/o or cp's or cough. Was never a smoker or using inhalers      Significant Hospital Events: Including procedures, antibiotic start and stop  dates in addition to other pertinent events   ET 10/10 >>> Urine strep 10/10>>>neg Urine legionella 10/10 >>>neg  Vanc 10/10 only zosyn /zmax  10/10 >>> Echo 10/10  1. Left ventricular ejection fraction, by estimation, is <20%. The left  ventricle has severely decreased function. The left ventricle demonstrates global hypokinesis. The left ventricular internal cavity size was moderately dilated. Left ventricular  diastolic parameters are consistent with Grade III diastolic dysfunction   2. Right ventricular systolic function is normal.   3. The mitral valve is normal in structure. Mild to moderate mitral valve regurgitation.    4. The aortic valve is tricuspid. Aortic valve regurgitation is not  visualized. No aortic stenosis is present.   5. The inferior vena cava is normal in size with greater than 50%  respiratory variability, suggesting right atrial pressure of 3 mmHg.   -   MRSA PCR 10/10 neg    -   Urine culture 10/10 neg    -   Sputum 10/11>>>mod wbc's, rare gpc's       Scheduled Meds:  atorvastatin  40 mg Per Tube Daily   Chlorhexidine Gluconate Cloth  6 each Topical Q0600   enoxaparin (LOVENOX) injection  40 mg Subcutaneous Q24H   [START ON 08/01/2022] furosemide  40 mg Intravenous Daily   metoprolol tartrate  25 mg Per Tube BID   mouth rinse  15 mL Mouth Rinse Q2H   pantoprazole (  PROTONIX) IV  40 mg Intravenous Q24H   Continuous Infusions:  azithromycin Stopped (07/31/22 1045)   dextrose 75 mL/hr at 07/30/22 1626   fentaNYL infusion INTRAVENOUS Stopped (07/31/22 0853)   piperacillin-tazobactam (ZOSYN)  IV Stopped (07/31/22 1020)   propofol (DIPRIVAN) infusion Stopped (07/31/22 0730)   PRN Meds:.acetaminophen, mouth rinse    Interim History / Subjective:  Sedation off at 730 slowly waking up and tol psv well at 5/5 with good tidal vol, no increaesed wob   Objective   Blood pressure (!) 140/56, pulse 83, temperature (!) 100.4 F (38 C), resp. rate (!) 22, height  5\' 4"  (1.626 m), weight 70.4 kg, SpO2 99 %.    Vent Mode: CPAP;PSV FiO2 (%):  [35 %-40 %] 40 % Set Rate:  [20 bmp] 20 bmp Vt Set:  [440 mL] 440 mL PEEP:  [5 cmH20] 5 cmH20 Pressure Support:  [5 cmH20] 5 cmH20 Plateau Pressure:  [14 cmH20-16 cmH20] 14 cmH20   Intake/Output Summary (Last 24 hours) at 07/31/2022 1245 Last data filed at 07/31/2022 1045 Gross per 24 hour  Intake 1507.96 ml  Output 2750 ml  Net -1242.04 ml   Filed Weights   07/29/22 0520 07/29/22 1240  Weight: 66.1 kg 70.4 kg    Examination: Tmax:  101.7  General appearance:    somewhat blank stare  off sedation  since around 730 am   No jvd Oropharynx et Neck supple Lungs with a few scattered exp > insp rhonchi bilaterally RRR no s3 or or sign murmur Abd obese with nl  excursion  Extr warm with no edema or clubbing noted Neuro  Sensorium blank stare not really f/c yet       Assessment & Plan:  1) Acute resp failure/ hypercarbic and hypoxemic in pt with hbp on ACEi and BNP > 1,000 in pt who went to bed apparently in baseline health strongly suggesive of ddx  chf   with/without aspiration event overnight >>> empirical diuresis/ abx as you paln >>> see cards eval/rx  >>> try wean to extubate 10/12    2)   Systolic LVD  - added lopressor 25 mg qid 10/10  - cards eval 10/11   3) possible CAP or asp pna > broad coverage adequate for now, consider early step down - PCT 0.11 not c/w sepsis but could still have "local" infection like Pna  - recheck PCT 10/11 and 10/12 trending down towards nl       Discussed with fm at bedside      Labs   CBC: Recent Labs  Lab 07/29/22 0345 07/29/22 0351 07/30/22 0434  WBC 25.0*  --  14.6*  NEUTROABS 14.3*  --   --   HGB 14.1 14.3 11.9*  HCT 43.7 42.0 36.4  MCV 89.0  --  87.9  PLT 254  --  175    Basic Metabolic Panel: Recent Labs  Lab 07/29/22 0345 07/29/22 0351 07/29/22 1327 07/30/22 0434 07/31/22 0443  NA 141 143  --  142 141  K 3.6 3.7  --  3.2*  4.2  CL 109 109  --  110 109  CO2 20*  --   --  24 21*  GLUCOSE 253* 248*  --  145* 133*  BUN 16 16  --  12 11  CREATININE 0.94 0.90  --  1.10* 1.14*  CALCIUM 9.4  --   --  8.9 8.8*  MG  --   --  2.0 1.9 2.1  PHOS  --   --  3.7  --   --    GFR: Estimated Creatinine Clearance: 44.2 mL/min (A) (by C-G formula based on SCr of 1.14 mg/dL (H)). Recent Labs  Lab 07/29/22 0345 07/29/22 0623 07/29/22 0815 07/30/22 0434 07/30/22 1403 07/31/22 0443  PROCALCITON  --  0.11  --   --  3.19 1.83  WBC 25.0*  --   --  14.6*  --   --   LATICACIDVEN  --  3.0* 2.3*  --   --   --     Liver Function Tests: Recent Labs  Lab 07/29/22 0345 07/30/22 0434  AST 36 52*  ALT 33 51*  ALKPHOS 87 71  BILITOT 0.7 0.9  PROT 7.7 6.0*  ALBUMIN 4.1 3.3*   No results for input(s): "LIPASE", "AMYLASE" in the last 168 hours. No results for input(s): "AMMONIA" in the last 168 hours.  ABG    Component Value Date/Time   PHART 7.42 07/30/2022 0319   PCO2ART 35 07/30/2022 0319   PO2ART 100 07/30/2022 0319   HCO3 22.7 07/30/2022 0319   TCO2 24 07/29/2022 0351   ACIDBASEDEF 1.3 07/30/2022 0319   O2SAT 99 07/30/2022 0319     Coagulation Profile: No results for input(s): "INR", "PROTIME" in the last 168 hours.  Cardiac Enzymes: No results for input(s): "CKTOTAL", "CKMB", "CKMBINDEX", "TROPONINI" in the last 168 hours.  HbA1C: Hgb A1c MFr Bld  Date/Time Value Ref Range Status  07/29/2022 06:23 AM 6.3 (H) 4.8 - 5.6 % Final    Comment:    (NOTE) Pre diabetes:          5.7%-6.4%  Diabetes:              >6.4%  Glycemic control for   <7.0% adults with diabetes     CBG: Recent Labs  Lab 07/30/22 1606 07/30/22 2153 07/31/22 0438 07/31/22 0725 07/31/22 1135  GLUCAP 142* 160* 112* 98 151*     The patient is critically ill with multiple organ systems failure and requires high complexity decision making for assessment and support, frequent evaluation and titration of therapies, application of  advanced monitoring technologies and extensive interpretation of multiple databases. Critical Care Time devoted to patient care services described in this note is 35 m minutes.   Sandrea Hughs, MD Pulmonary and Critical Care Medicine [facility] Cell [phone]   After 7:00 pm call Elink  [phone]

## 2022-07-31 NOTE — Progress Notes (Signed)
Rounding Note    Patient Name: Summer Christensen Date of Encounter: 07/31/2022  [place] Cardiologist: None   Subjective   Net negative 730 cc yesterday (though I/Os appear incomplete).  Weight not recorded. Cr stable at 1.14. BP 120/52. Febrile to 101.3F yesterday. Remains intubated, with SpO2 100% on FiO2 40%, PEEP 5.  Alert and following commands  Inpatient Medications    Scheduled Meds:  atorvastatin  40 mg Per Tube Daily   Chlorhexidine Gluconate Cloth  6 each Topical Q0600   enoxaparin (LOVENOX) injection  40 mg Subcutaneous Q24H   furosemide  40 mg Intravenous BID   metoprolol tartrate  25 mg Per Tube QID   mouth rinse  15 mL Mouth Rinse Q2H   pantoprazole (PROTONIX) IV  40 mg Intravenous Q24H   Continuous Infusions:  azithromycin 500 mg (07/30/22 0914)   dextrose 75 mL/hr at 07/30/22 1626   fentaNYL infusion INTRAVENOUS 50 mcg/hr (07/31/22 0823)   piperacillin-tazobactam (ZOSYN)  IV 3.375 g (07/31/22 5366)   propofol (DIPRIVAN) infusion Stopped (07/31/22 0730)   PRN Meds: acetaminophen, mouth rinse   Vital Signs    Vitals:   07/30/22 2151 07/30/22 2200 07/30/22 2322 07/31/22 0313  BP: 110/61 (!) 113/51  (!) 120/52  Pulse: 66 66    Resp: 20 20    Temp: 98.8 F (37.1 C) 98.8 F (37.1 C)    TempSrc:      SpO2: 100% 100% 100% 100%  Weight:      Height:        Intake/Output Summary (Last 24 hours) at 07/31/2022 0845 Last data filed at 07/31/2022 0738 Gross per 24 hour  Intake 1984.62 ml  Output 3150 ml  Net -1165.38 ml      07/29/2022   12:40 PM 07/29/2022    5:20 AM 12/25/2021    6:40 AM  Last 3 Weights  Weight (lbs) 155 lb 3.3 oz 145 lb 11.6 oz 145 lb  Weight (kg) 70.4 kg 66.1 kg 65.772 kg      Telemetry    NSR 60s, blocked PACs, SVT x 8beats - Personally Reviewed  ECG    No new ECG - Personally Reviewed  Physical Exam   GEN: intubated Neck: No JVD Cardiac: RRR, no murmurs, rubs, or gallops.  Respiratory: Diminished  breath sounds GI: Soft, nontender MS: No edema Neuro:  follows commands Psych: unable to assess  Labs    High Sensitivity Troponin:   Recent Labs  Lab 07/29/22 0345 07/29/22 0620 07/29/22 0623 07/29/22 1054  TROPONINIHS 17 122* 128* 263*     Chemistry Recent Labs  Lab 07/29/22 0345 07/29/22 0351 07/29/22 1327 07/30/22 0434 07/31/22 0443  NA 141 143  --  142 141  K 3.6 3.7  --  3.2* 4.2  CL 109 109  --  110 109  CO2 20*  --   --  24 21*  GLUCOSE 253* 248*  --  145* 133*  BUN 16 16  --  12 11  CREATININE 0.94 0.90  --  1.10* 1.14*  CALCIUM 9.4  --   --  8.9 8.8*  MG  --   --  2.0 1.9 2.1  PROT 7.7  --   --  6.0*  --   ALBUMIN 4.1  --   --  3.3*  --   AST 36  --   --  52*  --   ALT 33  --   --  51*  --   ALKPHOS  87  --   --  71  --   BILITOT 0.7  --   --  0.9  --   GFRNONAA >60  --   --  54* 52*  ANIONGAP 12  --   --  8 11    Lipids  Recent Labs  Lab 07/30/22 0434  TRIG 151*    Hematology Recent Labs  Lab 07/29/22 0345 07/29/22 0351 07/30/22 0434  WBC 25.0*  --  14.6*  RBC 4.91  --  4.14  HGB 14.1 14.3 11.9*  HCT 43.7 42.0 36.4  MCV 89.0  --  87.9  MCH 28.7  --  28.7  MCHC 32.3  --  32.7  RDW 13.2  --  13.3  PLT 254  --  175   Thyroid No results for input(s): "TSH", "FREET4" in the last 168 hours.  BNP Recent Labs  Lab 07/29/22 0345  BNP 1,157.0*    DDimer No results for input(s): "DDIMER" in the last 168 hours.   Radiology    DG Chest Port 1 View  Result Date: 07/30/2022 CLINICAL DATA:  Acute on chronic respiratory failure with hypoxemia. EXAM: PORTABLE CHEST 1 VIEW COMPARISON:  07/29/2022 FINDINGS: ET tube tip is above the carina. Enteric tube tip courses below the level of the hemidiaphragms. Unchanged asymmetric elevation of the right hemidiaphragm. Persistent airspace disease within the left lower lobe which appears unchanged from the previous exam. Decreased interstitial edema pattern. IMPRESSION: 1. Stable support apparatus. 2.  Persistent left lower lobe airspace disease. 3. Decreased interstitial edema pattern. Electronically Signed   By: Signa Kell M.D.   On: 07/30/2022 08:54   ECHOCARDIOGRAM COMPLETE  Result Date: 07/29/2022    ECHOCARDIOGRAM REPORT   Patient Name:   Summer Christensen Date of Exam: 07/29/2022 Medical Rec #:  [id number]     Height:       64.0 in Accession #:    [id number]    Weight:       145.7 lb Date of Birth:  Dec 22, 1950     BSA:          1.710 m Patient Age:    71 years      BP:           102/69 mmHg Patient Gender: F             HR:           95 bpm. [facility] Procedure: 2D Echo, Color Doppler, Cardiac Doppler and Intracardiac            Opacification Agent Indications:    I50.21 Acute systolic (congestive) heart failure  History:        Patient has prior history of Echocardiogram examinations, most                 recent 01/08/2011. Arrythmias:LBBB; Risk Factors:Hypertension and                 Diabetes.  Sonographer:    Irving Burton Senior RDCS Referring Phys: [id number] OLADAPO ADEFESO  Sonographer Comments: Scanned supine on artificial respirator. IMPRESSIONS  1. Left ventricular ejection fraction, by estimation, is <20%. The left ventricle has severely decreased function. The left ventricle demonstrates global hypokinesis. The left ventricular internal cavity size was moderately dilated. Left ventricular diastolic parameters are consistent with Grade III diastolic dysfunction (restrictive).  2. Right ventricular systolic function is normal. The right ventricular size is normal.  3. The mitral valve is normal in structure. Mild  to moderate mitral valve regurgitation. No evidence of mitral stenosis.  4. The aortic valve is tricuspid. Aortic valve regurgitation is not visualized. No aortic stenosis is present.  5. The inferior vena cava is normal in size with greater than 50% respiratory variability, suggesting right atrial pressure of 3 mmHg. FINDINGS  Left Ventricle: Left ventricular ejection fraction,  by estimation, is <20%. The left ventricle has severely decreased function. The left ventricle demonstrates global hypokinesis. Definity contrast agent was given IV to delineate the left ventricular endocardial borders. The left ventricular internal cavity size was moderately dilated. There is no left ventricular hypertrophy. Abnormal (paradoxical) septal motion, consistent with left bundle branch block. Left ventricular diastolic parameters are consistent with Grade III diastolic dysfunction (restrictive). Right Ventricle: The right ventricular size is normal. No increase in right ventricular wall thickness. Right ventricular systolic function is normal. Left Atrium: Left atrial size was normal in size. Right Atrium: Right atrial size was normal in size. Pericardium: There is no evidence of pericardial effusion. Mitral Valve: The mitral valve is normal in structure. Mild to moderate mitral valve regurgitation. No evidence of mitral valve stenosis. Tricuspid Valve: The tricuspid valve is normal in structure. Tricuspid valve regurgitation is not demonstrated. No evidence of tricuspid stenosis. Aortic Valve: The aortic valve is tricuspid. Aortic valve regurgitation is not visualized. No aortic stenosis is present. Pulmonic Valve: The pulmonic valve was normal in structure. Pulmonic valve regurgitation is mild. No evidence of pulmonic stenosis. Aorta: The aortic root is normal in size and structure. Venous: The inferior vena cava is normal in size with greater than 50% respiratory variability, suggesting right atrial pressure of 3 mmHg. IAS/Shunts: No atrial level shunt detected by color flow Doppler.  LEFT VENTRICLE PLAX 2D LVIDd:         5.90 cm      Diastology LVIDs:         4.50 cm      LV e' medial:    4.90 cm/s LV PW:         1.00 cm      LV E/e' medial:  20.8 LV IVS:        0.90 cm      LV e' lateral:   7.94 cm/s LVOT diam:     1.90 cm      LV E/e' lateral: 12.8 LV SV:         30 LV SV Index:   18 LVOT Area:      2.84 cm  LV Volumes (MOD) LV vol d, MOD A2C: 131.0 ml LV vol d, MOD A4C: 114.0 ml LV vol s, MOD A2C: 114.0 ml LV vol s, MOD A4C: 102.0 ml LV SV MOD A2C:     17.0 ml LV SV MOD A4C:     114.0 ml LV SV MOD BP:      14.8 ml RIGHT VENTRICLE RV S prime:     9.03 cm/s TAPSE (M-mode): 1.9 cm LEFT ATRIUM             Index        RIGHT ATRIUM           Index LA diam:        4.00 cm 2.34 cm/m   RA Area:     12.70 cm LA Vol (A2C):   56.3 ml 32.92 ml/m  RA Volume:   26.30 ml  15.38 ml/m LA Vol (A4C):   39.7 ml 23.22 ml/m LA Biplane Vol: 48.8 ml 28.54 ml/m  AORTIC VALVE LVOT Vmax:   92.70 cm/s LVOT Vmean:  62.300 cm/s LVOT VTI:    0.106 m  AORTA Ao Root diam: 3.00 cm Ao Asc diam:  3.30 cm MITRAL VALVE MV Area (PHT): 5.50 cm     SHUNTS MV Decel Time: 138 msec     Systemic VTI:  0.11 m MV E velocity: 102.00 cm/s  Systemic Diam: 1.90 cm MV A velocity: 30.00 cm/s MV E/A ratio:  3.40 Donato Schultz MD Electronically signed by Donato Schultz MD Signature Date/Time: 07/29/2022/10:33:16 AM    Final     Cardiac Studies   Echo 07/29/22:  1. Left ventricular ejection fraction, by estimation, is <20%. The left  ventricle has severely decreased function. The left ventricle demonstrates  global hypokinesis. The left ventricular internal cavity size was  moderately dilated. Left ventricular  diastolic parameters are consistent with Grade III diastolic dysfunction  (restrictive).   2. Right ventricular systolic function is normal. The right ventricular  size is normal.   3. The mitral valve is normal in structure. Mild to moderate mitral valve  regurgitation. No evidence of mitral stenosis.   4. The aortic valve is tricuspid. Aortic valve regurgitation is not  visualized. No aortic stenosis is present.   5. The inferior vena cava is normal in size with greater than 50%  respiratory variability, suggesting right atrial pressure of 3 mmHg.   Patient Profile     71 y.o. female with history of HTN, HLD, LBBB, presented with  respiratory failure requiring intubation with findings concerning for CHF - echo shows LVEF <20%  Assessment & Plan     Acute combined heart failure: Echocardiogram showed EF less than 20%, grade 3 diastolic dysfunction, normal RV function, mild to moderate MR.  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  -Continue IV Lasix.  Suspect approaching euvolemia, will decrease dose to 40 mg IV once daily -Will add GDMT as tolerated.  Metoprolol was started already, OK to continue.   -Will need ischemic evaluation if EF does not improve with resolution of her acute illness  Acute hypoxic respiratory failure: likely due to pneumonia, with heart failure contributing as above. Presented with severe hypoxia, required intubation.  Has been febrile, WBC 25, BNP 1157, PCT 3.19.  CTA showed no PE or dissection but with findings compatible with pneumonia versus aspiration and pleural effusions and pulmonary venous congestion.   -Antibiotics per primary team  Troponin elevation: Troponin 17 > 263.  EKG with left bundle branch block.  Likely demand ischemia in setting of acute illness   CRITICAL CARE TIME: I have spent a total of 34 minutes with patient reviewing hospital notes, telemetry, EKGs, labs and examining the patient as well as establishing an assessment and plan that was discussed with the patient's daughter.  > 50% of time was spent in direct patient care. The patient is critically ill with multi-organ system failure and requires high complexity decision making for assessment and support, frequent evaluation and titration of therapies, application of advanced monitoring technologies and extensive interpretation of multiple databases.   For questions or updates, please contact [facility] Please consult [email or web address] for contact info under        Signed, Little Ishikawa, MD  07/31/2022, 8:45 AM

## 2022-08-01 ENCOUNTER — Inpatient Hospital Stay (HOSPITAL_COMMUNITY): Payer: Medicare Other

## 2022-08-01 DIAGNOSIS — J9601 Acute respiratory failure with hypoxia: Secondary | ICD-10-CM | POA: Diagnosis not present

## 2022-08-01 DIAGNOSIS — E1165 Type 2 diabetes mellitus with hyperglycemia: Secondary | ICD-10-CM | POA: Diagnosis not present

## 2022-08-01 DIAGNOSIS — A419 Sepsis, unspecified organism: Secondary | ICD-10-CM | POA: Diagnosis not present

## 2022-08-01 DIAGNOSIS — I5041 Acute combined systolic (congestive) and diastolic (congestive) heart failure: Secondary | ICD-10-CM | POA: Diagnosis not present

## 2022-08-01 DIAGNOSIS — J9602 Acute respiratory failure with hypercapnia: Secondary | ICD-10-CM | POA: Diagnosis not present

## 2022-08-01 LAB — PATHOLOGIST SMEAR REVIEW

## 2022-08-01 LAB — BASIC METABOLIC PANEL
Anion gap: 7 (ref 5–15)
BUN: 13 mg/dL (ref 8–23)
CO2: 25 mmol/L (ref 22–32)
Calcium: 8.6 mg/dL — ABNORMAL LOW (ref 8.9–10.3)
Chloride: 105 mmol/L (ref 98–111)
Creatinine, Ser: 1 mg/dL (ref 0.44–1.00)
GFR, Estimated: >60 mL/min (ref >60)
Glucose, Bld: 156 mg/dL — ABNORMAL HIGH (ref 70–99)
Potassium: 3.7 mmol/L (ref 3.5–5.1)
Sodium: 137 mmol/L (ref 135–145)

## 2022-08-01 LAB — CULTURE, RESPIRATORY W GRAM STAIN: Culture: NORMAL

## 2022-08-01 LAB — GLUCOSE, CAPILLARY
Glucose-Capillary: 148 mg/dL — ABNORMAL HIGH (ref 70–99)
Glucose-Capillary: 155 mg/dL — ABNORMAL HIGH (ref 70–99)
Glucose-Capillary: 123 mg/dL — ABNORMAL HIGH (ref 70–99)
Glucose-Capillary: 133 mg/dL — ABNORMAL HIGH (ref 70–99)

## 2022-08-01 LAB — PROCALCITONIN: Procalcitonin: 1.17 ng/mL

## 2022-08-01 MED ORDER — EMPAGLIFLOZIN 10 MG PO TABS
10.0000 mg | ORAL_TABLET | Freq: Every day | ORAL | Status: DC
  Administered 2022-08-01 – 2022-08-04 (×4): 10 mg via ORAL
  Filled 2022-08-01 (×7): qty 1

## 2022-08-01 MED ORDER — POTASSIUM CHLORIDE 20 MEQ PO PACK
40.0000 meq | PACK | Freq: Once | ORAL | Status: AC
  Administered 2022-08-01: 40 meq via ORAL
  Filled 2022-08-01: qty 2

## 2022-08-01 MED ORDER — METOPROLOL TARTRATE 25 MG PO TABS
25.0000 mg | ORAL_TABLET | Freq: Two times a day (BID) | ORAL | Status: DC
  Administered 2022-08-01 – 2022-08-02 (×2): 25 mg via ORAL
  Filled 2022-08-01 (×2): qty 1

## 2022-08-01 MED ORDER — ORAL CARE MOUTH RINSE: 15.0000 mL | OROMUCOSAL | Status: DC | PRN

## 2022-08-01 MED ORDER — PANTOPRAZOLE SODIUM 40 MG PO TBEC
40.0000 mg | DELAYED_RELEASE_TABLET | Freq: Every day | ORAL | Status: DC
  Administered 2022-08-01 – 2022-08-04 (×4): 40 mg via ORAL
  Filled 2022-08-01 (×4): qty 1

## 2022-08-01 NOTE — Progress Notes (Signed)
Progress Note  Patient Name: Summer Christensen Date of Encounter: 08/01/2022  Primary Cardiologist: None- new to Dr. Laban Emperor  Subjective   Overnight has been extubated. Patient notes that she feels much better. Family is excited she is better but doesn't want to rush her going home.  Inpatient Medications    Scheduled Meds:  atorvastatin  40 mg Per Tube Daily   Chlorhexidine Gluconate Cloth  6 each Topical Q0600   enoxaparin (LOVENOX) injection  40 mg Subcutaneous Q24H   furosemide  40 mg Intravenous Daily   metoprolol tartrate  25 mg Per Tube BID   pantoprazole (PROTONIX) IV  40 mg Intravenous Q24H   Continuous Infusions:  azithromycin Stopped (07/31/22 1045)   dextrose 75 mL/hr at 07/31/22 1555   fentaNYL infusion INTRAVENOUS Stopped (07/31/22 0853)   piperacillin-tazobactam (ZOSYN)  IV 3.375 g (08/01/22 0657)   PRN Meds: acetaminophen, acetaminophen, ondansetron (ZOFRAN) IV, mouth rinse   Vital Signs    Vitals:   08/01/22 0300 08/01/22 0400 08/01/22 0500 08/01/22 0714  BP: 100/65 (!) 106/46 (!) 125/53 (!) 112/39  Pulse: 76 80 84 83  Resp: 18 (!) 26 (!) 26 (!) 24  Temp:  99.7 F (37.6 C)    TempSrc:      SpO2: 95% (!) 89% (!) 88% 96%  Weight:   70 kg   Height:        Intake/Output Summary (Last 24 hours) at 08/01/2022 0819 Last data filed at 08/01/2022 1601 Gross per 24 hour  Intake 1236.18 ml  Output 2075 ml  Net -838.82 ml   Filed Weights   07/29/22 0520 07/29/22 1240 08/01/22 0500  Weight: 66.1 kg 70.4 kg 70 kg    Telemetry    SR with PVCs; NSVT is actually artifact - Personally Reviewed  Physical Exam   Gen: Mild distress   Cardiac: No Rubs or Gallops, systolic murmur RRR +2 radial pulses Respiratory: Coarse breath sounds with tachypnea but no increase WOB (accessory muscle use) GI: Soft, nontender, non-distended  MS: No  edema;  moves all extremities Integument: Skin feels warm Neuro:  At time of evaluation, alert and oriented to  person/place/time/situation  Psych: Normal affect, patient feels OK, considering   Labs    Chemistry Recent Labs  Lab 07/29/22 0345 07/29/22 0351 07/30/22 0434 07/31/22 0443 07/31/22 2129 08/01/22 0404  NA 141   < > 142 141 140 137  K 3.6   < > 3.2* 4.2 2.8* 3.7  CL 109   < > 110 109 105 105  CO2 20*  --  24 21* 25 25  GLUCOSE 253*   < > 145* 133* 151* 156*  BUN 16   < > 12 11 11 13   CREATININE 0.94   < > 1.10* 1.14* 1.06* 1.00  CALCIUM 9.4  --  8.9 8.8* 8.9 8.6*  PROT 7.7  --  6.0*  --   --   --   ALBUMIN 4.1  --  3.3*  --   --   --   AST 36  --  52*  --   --   --   ALT 33  --  51*  --   --   --   ALKPHOS 87  --  71  --   --   --   BILITOT 0.7  --  0.9  --   --   --   GFRNONAA >60  --  54* 52* 57* >60  ANIONGAP 12  --  8 11 10 7    < > = values in this interval not displayed.     Hematology Recent Labs  Lab 07/29/22 0345 07/29/22 0351 07/30/22 0434  WBC 25.0*  --  14.6*  RBC 4.91  --  4.14  HGB 14.1 14.3 11.9*  HCT 43.7 42.0 36.4  MCV 89.0  --  87.9  MCH 28.7  --  28.7  MCHC 32.3  --  32.7  RDW 13.2  --  13.3  PLT 254  --  175    Cardiac EnzymesNo results for input(s): "TROPONINI" in the last 168 hours. No results for input(s): "TROPIPOC" in the last 168 hours.   BNP Recent Labs  Lab 07/29/22 0345  BNP 1,157.0*     DDimer No results for input(s): "DDIMER" in the last 168 hours.   Radiology    DG Chest Port 1 View  Result Date: 07/31/2022 CLINICAL DATA:  Bibasilar crackles. EXAM: PORTABLE CHEST 1 VIEW COMPARISON:  07/31/2022 FINDINGS: Interval removal of enteric and endotracheal tubes. Mild cardiac enlargement. No vascular congestion or edema. Lung bases and costophrenic angles are clear. No pneumothorax. IMPRESSION: Cardiac enlargement.  Lungs appear clear postextubation. Electronically Signed   By: 09/30/2022 M.D.   On: 07/31/2022 23:34   DG Chest Port 1 View  Result Date: 07/31/2022 CLINICAL DATA:  Acute respiratory failure EXAM:  PORTABLE CHEST 1 VIEW COMPARISON:  Chest 07/30/2022 FINDINGS: Endotracheal tube 2.5 cm above the carina. NG tube in the stomach with the tip not visualized. Left lower lobe consolidation with mild progression. Mild right lower lobe airspace disease with progression. IMPRESSION: 1. Endotracheal tube in good position. 2. Progression of bibasilar airspace disease left greater than right. Electronically Signed   By: 09/29/2022 M.D.   On: 07/31/2022 14:01     Patient Profile     71 y.o. 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% and LV dilation Mitral regurgitation, functional Sepsis and septic shock; hypotension with NSTEMI related to sepsis LBBB - NYHA IV, Unclear stage, hypervolemic, related to sepsis - continue lasix 40 gm IV today - no evidence of UTI, Kidney Ok will add SGLT2i today - on ARB at home; would not tolerate losartan presently, at DC will hope to add back home ARB (if BP recovers add 10/14_  Paroxysmal SVT PVCs - related to HF, will add K and may add spironolactone 12.5 mg PO daily at discharge  Aortic atherosclerosis -continue home statin     For questions or updates, please contact [facility] Please consult [email or web address] for contact info under Cardiology/STEMI.      66, MD FASE Cardiologist [facility]  [address] [place], [address] [place], [place] [phone]  8:19 AM

## 2022-08-01 NOTE — Progress Notes (Signed)
   08/01/22 1819  Vitals  Temp 98.3 F (36.8 C)  BP 131/74  MAP (mmHg) 90  BP Location Right Arm  BP Method Automatic  Patient Position (if appropriate) Lying  Pulse Rate (!) 105  Resp 16  MEWS COLOR  MEWS Score Color Green  Oxygen Therapy  SpO2 94 %  O2 Device Nasal Cannula  O2 Flow Rate (L/min) 5 L/min  MEWS Score  MEWS Temp 0  MEWS Systolic 0  MEWS Pulse 1  MEWS RR 0  MEWS LOC 0  MEWS Score 1

## 2022-08-01 NOTE — Progress Notes (Signed)
NAME:  Summer Christensen, MRN:  [id number], DOB:  [birth year], LOS: 3 ADMISSION DATE:  07/29/2022, CONSULTATION DATE:  07/29/22  REFERRING MD:  Tat, CHIEF COMPLAINT:  acute resp failure   History of Present Illness:  54 yobf never smoker  with medical history significant of hypertension on ACEi, hyperlipidemia who presented to the emergency department early am 10/10   by private vehicle due to shortness of breath.  Patient was already intubated and sedated, so she was unable to provide history, history was provided dated by son.  Per son, patient woke up in the course of the night complaining of not being able to breathe and asked him to take her to the ER.  By the time patient arrived at the ER, she was altered, obtunded and was in severe respiratory distress.  O2 sat was noted to be 40% on arrival to the ED.   ED Course:  In the emergency department, she was tachycardic, tachypneic and BP was 200/108, temperature was 98.65F and O2 sat was 95% after being intubated.  Work-up in the ED showed normal CBC except for WBC of 25.  BNP  1157 BMP was normal except for bicarb of 20 and blood glucose of 253.  Troponin x1 was 17.  VBG done on arrival showed pH of 7.11 with PCO2 of 67, patient was intubated and sedated and subsequent ABG showed an improvement with a pH of 7.24, PCO2 of 53 and PO2 of 100. CT angiography chest, abdomen and pelvis was suggestive of pneumonia versus aspiration.  Trace layering pleural effusions and additional central pulmonary groundglass opacity with septal thickening raising the possibility of mild or develop interstitial edema.  Stable cardiomegaly.  No pericardial effusion Patient was started on IV Vanco and Zosyn  Baseline per son Verner Chol :  fully independent/ ambulatory, did gardening and felt fine when went to bed 10/9  - ate [place] for supper, no gi c/o or cp's or cough. Was never a smoker or using inhalers      Significant Hospital Events: Including procedures, antibiotic  start and stop dates in addition to other pertinent events   ET 10/10 >  10/12  Urine strep 10/10 neg Urine legionella 10/10  neg  Vanc 10/10 only zosyn /zmax  10/10 >>> Echo 10/10  1. Left ventricular ejection fraction, by estimation, is <20%. The left  ventricle has severely decreased function. The left ventricle demonstrates global hypokinesis. The left ventricular internal cavity size was moderately dilated. Left ventricular  diastolic parameters are consistent with Grade III diastolic dysfunction   2. Right ventricular systolic function is normal.   3. The mitral valve is normal in structure. Mild to moderate mitral valve regurgitation.    4. The aortic valve is tricuspid. Aortic valve regurgitation is not  visualized. No aortic stenosis is present.   5. The inferior vena cava is normal in size with greater than 50%  respiratory variability, suggesting right atrial pressure of 3 mmHg.   -   MRSA PCR 10/10 neg    -   Urine culture 10/10 neg    -   Sputum per ET 10/11>>>mod wbc's, rare gpc's> nl flora        Scheduled Meds:  atorvastatin  40 mg Per Tube Daily   Chlorhexidine Gluconate Cloth  6 each Topical Q0600   empagliflozin  10 mg Oral Daily   enoxaparin (LOVENOX) injection  40 mg Subcutaneous Q24H   furosemide  40 mg Intravenous Daily   metoprolol tartrate  25 mg Oral BID   pantoprazole  40 mg Oral Daily   Continuous Infusions:  azithromycin Stopped (08/01/22 1015)   dextrose 75 mL/hr at 08/01/22 1212   fentaNYL infusion INTRAVENOUS Stopped (07/31/22 0853)   piperacillin-tazobactam (ZOSYN)  IV Stopped (08/01/22 1058)   PRN Meds:.acetaminophen, acetaminophen, ondansetron (ZOFRAN) IV, mouth rinse, mouth rinse    Interim History / Subjective:  Sitting in chair and re-iterates that she had no warning at all before developing classic PND and resp distress (not cough or prior orthopnea/ fever)  Objective   Blood pressure (!) 145/113, pulse 94, temperature 99.2 F (37.3  C), temperature source Oral, resp. rate 17, height 5\' 4"  (1.626 m), weight 70 kg, SpO2 96 %.        Intake/Output Summary (Last 24 hours) at 08/01/2022 1313 Last data filed at 08/01/2022 1212 Gross per 24 hour  Intake 2553.84 ml  Output 2375 ml  Net 178.84 ml   Filed Weights   07/29/22 0520 07/29/22 1240 08/01/22 0500  Weight: 66.1 kg 70.4 kg 70 kg    Examination: Tmax:  102.1 General appearance:    looks great in chair, some rattling with better cough mechanics today  At Rest 02 sats  96% on 5 lpm HFNC   No jvd Oropharynx clear,  mucosa nl Neck supple Lungs with decreased bs both bases, no bronchial changes  RRR no s3 or or sign murmur Abd soft with nl  excursion  Extr warm with no edema or clubbing noted Neuro  Sensorium intact   no apparent motor deficits       Assessment & Plan:  1) Acute resp failure/ hypercarbic and hypoxemic in pt with hbp on ACEi and BNP > 1,000 in pt who went to bed   in baseline health strongly suggesive of ddx  chf   with/without aspiration event overnight - s/p successful extubation 10/12  - some atx in bases, no def pna > rx mobilize/ IS  - wean 02 for sats > 85%   2)   Systolic LVD  - added lopressor 25 mg qid 10/10  - cards eval 10/11   3) possible CAP or asp pna > broad coverage adequate for now  - PCT 0.11 not c/w sepsis but could still have "local" infection like Pna  - recheck PCT 10/11   10/12.  10/13  trending down towards nl   - ET specimen 10/11 nl flora  >>> rx per [facility]      PCCM f/u is prn     Labs   CBC: Recent Labs  Lab 07/29/22 0345 07/29/22 0351 07/30/22 0434  WBC 25.0*  --  14.6*  NEUTROABS 14.3*  --   --   HGB 14.1 14.3 11.9*  HCT 43.7 42.0 36.4  MCV 89.0  --  87.9  PLT 254  --  277    Basic Metabolic Panel: Recent Labs  Lab 07/29/22 0345 07/29/22 0351 07/29/22 1327 07/30/22 0434 07/31/22 0443 07/31/22 2129 08/01/22 0404  NA 141 143  --  142 141 140 137  K 3.6 3.7  --  3.2* 4.2 2.8* 3.7  CL  109 109  --  110 109 105 105  CO2 20*  --   --  24 21* 25 25  GLUCOSE 253* 248*  --  145* 133* 151* 156*  BUN 16 16  --  12 11 11 13   CREATININE 0.94 0.90  --  1.10* 1.14* 1.06* 1.00  CALCIUM 9.4  --   --  8.9 8.8* 8.9 8.6*  MG  --   --  2.0 1.9 2.1 2.1  --   PHOS  --   --  3.7  --   --   --   --    GFR: Estimated Creatinine Clearance: 50.2 mL/min (by C-G formula based on SCr of 1 mg/dL). Recent Labs  Lab 07/29/22 0345 07/29/22 0623 07/29/22 0815 07/30/22 0434 07/30/22 1403 07/31/22 0443 08/01/22 0404  PROCALCITON  --  0.11  --   --  3.19 1.83 1.17  WBC 25.0*  --   --  14.6*  --   --   --   LATICACIDVEN  --  3.0* 2.3*  --   --   --   --     Liver Function Tests: Recent Labs  Lab 07/29/22 0345 07/30/22 0434  AST 36 52*  ALT 33 51*  ALKPHOS 87 71  BILITOT 0.7 0.9  PROT 7.7 6.0*  ALBUMIN 4.1 3.3*   No results for input(s): "LIPASE", "AMYLASE" in the last 168 hours. No results for input(s): "AMMONIA" in the last 168 hours.  ABG    Component Value Date/Time   PHART 7.42 07/30/2022 0319   PCO2ART 35 07/30/2022 0319   PO2ART 100 07/30/2022 0319   HCO3 30.2 (H) 07/31/2022 2129   TCO2 24 07/29/2022 0351   ACIDBASEDEF 1.3 07/30/2022 0319   O2SAT 26.8 07/31/2022 2129     Coagulation Profile: No results for input(s): "INR", "PROTIME" in the last 168 hours.  Cardiac Enzymes: No results for input(s): "CKTOTAL", "CKMB", "CKMBINDEX", "TROPONINI" in the last 168 hours.  HbA1C: Hgb A1c MFr Bld  Date/Time Value Ref Range Status  07/29/2022 06:23 AM 6.3 (H) 4.8 - 5.6 % Final    Comment:    (NOTE) Pre diabetes:          5.7%-6.4%  Diabetes:              >6.4%  Glycemic control for   <7.0% adults with diabetes     CBG: Recent Labs  Lab 07/31/22 1931 08/01/22 0100 08/01/22 0458 08/01/22 0729 08/01/22 1157  GLUCAP 140* 148* 155* 123* 133*      Sandrea Hughs, MD Pulmonary and Critical Care Medicine [facility] Cell [phone]   After 7:00 pm  call Elink  [phone]

## 2022-08-01 NOTE — Progress Notes (Signed)
Pharmacy Antibiotic Note  Summer Christensen is a 70 y.o. female admitted on 07/29/2022 with SOB/PNA.  Pharmacy has been consulted for  Zosyn  dosing. Still spiking temps, PCT improved. WBC improved. Slow progress  Plan: Continue Zosyn 3.375 g IV q8h EID over 4 hours F/U cxs and clinical progress Monitor V/S, labs  Height: 5\' 4"  (162.6 cm) Weight: 70 kg (154 lb 5.2 oz) IBW/kg (Calculated) : 54.7  Temp (24hrs), Avg:100.6 F (38.1 C), Min:98.9 F (37.2 C), Max:102.1 F (38.9 C)  Recent Labs  Lab 07/29/22 0345 07/29/22 0351 07/29/22 0623 07/29/22 0815 07/30/22 0434 07/31/22 0443 07/31/22 2129 08/01/22 0404  WBC 25.0*  --   --   --  14.6*  --   --   --   CREATININE 0.94 0.90  --   --  1.10* 1.14* 1.06* 1.00  LATICACIDVEN  --   --  3.0* 2.3*  --   --   --   --      Estimated Creatinine Clearance: 50.2 mL/min (by C-G formula based on SCr of 1 mg/dL).    No Known Allergies  Abx 10/10 Vanc>>10/11 10/10 Zosyn>> 10/10 Azith>>  Microbiology: 10/10 Bcx ngtd 10/10 UCx no growth 10/10 Resp Cx GPC in pairs 10/10 Legionella antigen is neg 10/10 MRSA-PCR negative  Isac Sarna, BS Pharm D, BCPS Clinical Pharmacist 08/01/2022 11:43 AM

## 2022-08-01 NOTE — Progress Notes (Signed)
Report called and given to Lauren, LPN on Dept. [id number]. Pt to be transferred to room [id number].

## 2022-08-01 NOTE — Progress Notes (Signed)
PROGRESS NOTE  Summer Christensen ATF:[id number] DOB: 07/09/1951 DOA: 07/29/2022 PCP: Vidal Schwalbe, MD  Brief History:  71 year old female with a history of hypertension, hyperlipidemia, left bundle branch block, migraine headache presenting with respiratory distress.  The patient is currently intubated and sedated.  History is obtained from speaking with the patient's son at the bedside and review of the medical record.  Apparently, the patient had been in her usual state of health up until the early morning 07/29/2022.  The patient woke up with shortness of breath.  She was brought to the emergency department by her son.  Upon arrival, the patient was noted to be obtunded in respiratory distress with saturation 48% on room air.  She was placed on nonrebreather with improvement to 64%.  She was subsequently intubated. Son and daughter at the bedside stated that the patient had been in her usual state of health without any complaints up until the time of admission.  She had not had any fevers, chills, headache, chest pain, shortness of breath, coughing, hemoptysis, nausea, vomiting. Notably, the patient has had chronic diarrhea for which she was seeing GI at [facility].  It was felt to be due to functional diarrhea versus IBS versus microscopic colitis with a component of biliary diarrhea.  She was last seen by GI at [facility] on 11/28/2019, and has been lost to follow-up since then.  There have been no reports of hematochezia or melena per the patient's family.  There is been no new medications.  The patient has not had any travels.  She has not had any sick contacts. In the ED, the patient was afebrile and hemodynamically stable.  She was intubated and sedated on the ventilator.  WBC 25.0, hemoglobin 14.1, platelets 254,000.  Sodium 141, potassium 3.6, CO2 20, serum creatinine 0.94.  AST 36, ALT 33, alk phosphatase 87, total bilirubin 0.7.  BNP 1157.  Troponin 17>> 128.  CT a chest was negative for pulmonary embolus  or dissection.  There was bilateral lower lobe consolidation, left greater than right with early upper lobe involvement.  There was superimposed perihilar and centrilobular pulmonary GGO with septal thickening.  CT of the abdomen and pelvis showed right renal cortical scarring.  There was retained stool in the transverse colon.  Otherwise there is no other acute intra-abdominal abnormalities.  The patient was started on vancomycin and Zosyn.    Assessment and Plan: * Sepsis due to undetermined organism ([place]) -Secondary to pneumonia -Sepsis present at time of admission with leukocytosis, tachycardia, respiratory distress -Procalcitonin 0.11>>3.19>>1.83; continue to follow trend.  -Follow culture result -Continue treatment with Zosyn and Zithromax  -Patient is still spiking low-grade fever -Continue as needed antipyretics and cooling blankets.   -Continue supportive care. -Fever curve continue trending down.   Acute respiratory failure with hypoxia and hypercarbia (HCC) -Secondary to pneumonia with component of fluid overload -Successfully extubated on 07/31/2022 -Appreciate assistance and recommendation by pulmonology service -Continue current antibiotics -Continue treatment with Lasix, follow daily weights and low-sodium diet. -Continue as needed bronchodilators -Continue current antibiotics therapy (Zosyn and Zithromax); patient is still experiencing low-grade temperature.  -Vancomycin DC in the setting of negative MRSA PCR.  Lobar pneumonia ([place]) -Procalcitonin adequately trending down -MRSA PCR negative; vancomycin discontinued -Continue Zosyn and Zithromax -Continue supportive care and follow culture results. -COVID and influenza PCR negative. -Continue the use of flutter valve and incentive spirometer -Continue to wean oxygen supplementation as tolerated -Patient successfully extubated on 07/31/2022 -Physical therapy evaluation requested.  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 -EKG without acute ischemic changes -2D echo demonstrating ejection fraction around 20% -Cardiology service consultation appreciated; feel patient experiencing nonischemic cardiomyopathy process in the setting of sepsis. -Continue Lasix and metoprolol; Jardiance has been initiated today as well (08/01/2022).. -Will follow further recommendations/GDMT adjustments by cardiology service...   Elevated brain natriuretic peptide (BNP) level -BNP 1157 -Vascular congestion and interstitial edema appreciated on chest images at time of admission. -Repeat chest x-ray demonstrated improvement in patient's vascular congestion.  Good urine output reported. 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 -Continue to follow daily weights/strict intake and output -Continue metoprolol and Lasix. -Patient started on Jardiance as well.  Acute combined systolic and diastolic heart failure ([place]) -What appeared to be nonischemic cardiomyopathy in the setting of sepsis -Continue to follow cardiology service recommendation -Follow daily weight and strict I's and O -Continue to maintain negative balance and continue the use of metoprolol, Jardiance and Lasix.  Hypokalemia -In the setting of diuresis -Continue to replete electrolytes as needed; follow trend. -Continue telemetry monitoring.  Controlled type 2 diabetes mellitus with hyperglycemia ([place]) -Updated hemoglobin A1c 6.3 -Patient is not on any agents in the outpatient setting -Continue sliding scale insulin while inpatient -Follow CBGs fluctuation -Lifestyle changes and modified carbohydrate diet to be resumed as an outpatient (patient with prediabetes parameters currently).  Essential hypertension -Continue holding losartan for now -Also needing blood pressure  stability for treatment with beta-blocker and diuretics. -Follow vital signs and cardiology service recommendations.   Family Communication:   son and daughter at bedside 07/31/2022.  Consultants:  PCCM, cardiology service  Code Status:  FULL code.  DVT Prophylaxis: [place] Lovenox   Procedures: As Listed in Progress Note Above  Antibiotics: Vanc 10/10>>10/11 (MRSA PCR negative) Zosyn 10/10>> Azithro 10/10>>  Subjective: Successfully extubated on 1223; requiring 6-8 L high flow nasal cannula supplementation to maintain saturations.  Reports no chest pain or palpitations.  Complaining of generalized soreness and weakness.  Low-grade temperature overnight.  Objective: Vitals:   08/01/22 0900 08/01/22 1000 08/01/22 1100 08/01/22 1214  BP: (!) 133/56 129/72 (!) 145/113   Pulse: 84 82 94   Resp: (!) 27 19 17    Temp:  99.9 F (37.7 C)  99.2 F (37.3 C)  TempSrc:  Rectal  Oral  SpO2: 97% 96% 96%   Weight:      Height:        Intake/Output Summary (Last 24 hours) at 08/01/2022 1221 Last data filed at 08/01/2022 1212 Gross per 24 hour  Intake 2553.84 ml  Output 2375 ml  Net 178.84 ml   Weight change:  Exam: General exam: Alert, awake, oriented x 3; reporting intermittent coughing spells, generalized weakness and feels sore all over.  No chest pain or palpitations. Respiratory system: Decreased breath sounds at the bases, positive rhonchi, no using accessory muscle.  Using 6-8 L high flow nasal cannula supplementation to maintain adequate saturation.  Successfully extubated 07/31/2022. Cardiovascular system: Rate controlled, no rubs, no gallops, positive systolic murmur appreciated on exam; no JVD. Gastrointestinal system: Abdomen is nondistended, soft and nontender. No organomegaly or masses felt. Normal bowel sounds heard. Central nervous system: Alert and oriented. No focal neurological deficits. Extremities: No cyanosis, clubbing or edema. Skin: No petechiae. Psychiatry:  Judgement and insight appear normal. Mood & affect appropriate.   Data Reviewed: I have personally reviewed following labs and imaging studies  Basic Metabolic Panel: Recent Labs  Lab 07/29/22 0345 07/29/22 0351 07/29/22 1327 07/30/22 0434 07/31/22 0443 07/31/22 2129 08/01/22 0404  NA 141 143  --  142 141 140 137  K 3.6 3.7  --  3.2* 4.2 2.8* 3.7  CL 109 109  --  110 109 105 105  CO2 20*  --   --  24 21* 25 25  GLUCOSE 253* 248*  --  145* 133* 151* 156*  BUN 16 16  --  12 11 11 13   CREATININE 0.94 0.90  --  1.10* 1.14* 1.06* 1.00  CALCIUM 9.4  --   --  8.9 8.8* 8.9 8.6*  MG  --   --  2.0 1.9 2.1 2.1  --   PHOS  --   --  3.7  --   --   --   --    Liver Function Tests: Recent Labs  Lab 07/29/22 0345 07/30/22 0434  AST 36 52*  ALT 33 51*  ALKPHOS 87 71  BILITOT 0.7 0.9  PROT 7.7 6.0*  ALBUMIN 4.1 3.3*   CBC: Recent Labs  Lab 07/29/22 0345 07/29/22 0351 07/30/22 0434  WBC 25.0*  --  14.6*  NEUTROABS 14.3*  --   --   HGB 14.1 14.3 11.9*  HCT 43.7 42.0 36.4  MCV 89.0  --  87.9  PLT 254  --  175   CBG: Recent Labs  Lab 07/31/22 1931 08/01/22 0100 08/01/22 0458 08/01/22 0729 08/01/22 1157  GLUCAP 140* 148* 155* 123* 133*   HbA1C: No results for input(s): "HGBA1C" in the last 72 hours.  Urine analysis:    Component Value Date/Time   COLORURINE STRAW (A) 07/29/2022 0730   APPEARANCEUR CLEAR 07/29/2022 0730   APPEARANCEUR Clear 11/20/2017 1200   LABSPEC 1.027 07/29/2022 0730   PHURINE 6.0 07/29/2022 0730   GLUCOSEU >=500 (A) 07/29/2022 0730   HGBUR NEGATIVE 07/29/2022 0730   BILIRUBINUR NEGATIVE 07/29/2022 0730   BILIRUBINUR Negative 11/20/2017 1200   KETONESUR NEGATIVE 07/29/2022 0730   PROTEINUR 100 (A) 07/29/2022 0730   UROBILINOGEN 0.2 11/14/2010 1850   NITRITE NEGATIVE 07/29/2022 0730   LEUKOCYTESUR NEGATIVE 07/29/2022 0730   Sepsis Labs:  Recent Results (from the past 240 hour(s))  Blood culture (routine x 2)     Status: None  (Preliminary result)   Collection Time: 07/29/22  6:17 AM   Specimen: BLOOD RIGHT ARM  Result Value Ref Range Status   Specimen Description BLOOD RIGHT ARM  Final   Special Requests   Final    BOTTLES DRAWN AEROBIC AND ANAEROBIC Blood Culture adequate volume   Culture   Final    NO GROWTH 3 DAYS Performed at [facility], [address], [place], [place] [address]    Report Status PENDING  Incomplete  Blood culture (routine x 2)     Status: None (Preliminary result)   Collection Time: 07/29/22  6:23 AM   Specimen: BLOOD LEFT ARM  Result Value Ref Range Status   Specimen Description BLOOD LEFT ARM  Final   Special Requests   Final    BOTTLES DRAWN AEROBIC AND ANAEROBIC Blood Culture adequate volume   Culture   Final    NO GROWTH 3 DAYS Performed at [facility], [address] [place], [place] [address]    Report Status PENDING  Incomplete  MRSA Next Gen by PCR, Nasal     Status: None   Collection Time: 07/29/22  7:29 AM   Specimen: Anterior Nasal Swab  Result Value Ref Range Status   MRSA by PCR Next Gen NOT DETECTED NOT DETECTED Final    Comment: (NOTE) The GeneXpert MRSA Assay (FDA approved for NASAL specimens only), is one component of a comprehensive MRSA colonization surveillance program. It is not intended to diagnose MRSA infection nor to guide or monitor treatment for MRSA infections. Test performance is not FDA approved in patients less than 49 years old. Performed at [facility], [address], [place], [place] [address]   SARS Coronavirus 2 by RT PCR (hospital order, performed in [facility] hospital lab) *cepheid single result test* Anterior Nasal Swab     Status: None   Collection Time: 07/29/22  7:29 AM   Specimen: Anterior Nasal Swab  Result Value Ref Range Status   SARS Coronavirus 2 by RT PCR NEGATIVE NEGATIVE Final    Comment: (NOTE) SARS-CoV-2 target nucleic acids are NOT DETECTED.  The SARS-CoV-2 RNA is generally detectable in upper and  lower respiratory specimens during the acute phase of infection. The lowest concentration of SARS-CoV-2 viral copies this assay can detect is 250 copies / mL. A negative result does not preclude SARS-CoV-2 infection and should not be used as the sole basis for treatment or other patient management decisions.  A negative result may occur with improper specimen collection / handling, submission of specimen other than nasopharyngeal swab, presence of viral mutation(s) within the areas targeted by this assay, and inadequate number of viral copies (<250 copies / mL). A negative result must be combined with clinical observations, patient history, and epidemiological information.  Fact Sheet for Patients:   https://www.patel.info/  Fact Sheet for Healthcare Providers: https://hall.com/  This test is not yet approved or  cleared by the [place] FDA and has been authorized for detection and/or diagnosis of SARS-CoV-2 by FDA under an Emergency Use Authorization (EUA).  This EUA will remain in effect (meaning this test can be used) for the duration of the COVID-19 declaration under Section 564(b)(1) of the Act, 21 U.S.C. section 360bbb-3(b)(1), unless the authorization is terminated or revoked sooner.  Performed at [facility], [address] [place], [place] [address]   Urine Culture     Status: None   Collection Time: 07/29/22  7:30 AM   Specimen: Urine, Catheterized  Result Value Ref Range Status   Specimen Description   Final    URINE, CATHETERIZED Performed at [facility], [address] [place], [place] [address]    Special Requests   Final    NONE Performed at [facility], [address], [place], [place] [address]    Culture   Final    NO GROWTH Performed at [facility], [place] [address] [place], [place] [address]    Report Status 07/31/2022 FINAL  Final  Culture, Respiratory w Gram Stain     Status: None (Preliminary  result)   Collection Time: 07/30/22  3:00 AM   Specimen: Tracheal Aspirate; Respiratory  Result Value Ref Range Status   Specimen Description   Final    TRACHEAL ASPIRATE Performed at [facility], [address], [place], [place] [address]    Special Requests   Final    NONE Performed at [place] [place],  [address]    Gram Stain   Final    MODERATE WBC PRESENT, PREDOMINANTLY PMN RARE GRAM POSITIVE COCCI IN PAIRS    Culture   Final    CULTURE REINCUBATED FOR BETTER GROWTH Performed at [facility],  [address], [place], [place] [address]    Report Status PENDING  Incomplete     Scheduled Meds:  atorvastatin  40 mg Per Tube Daily   Chlorhexidine Gluconate Cloth  6 each Topical Q0600   empagliflozin  10 mg Oral Daily   enoxaparin (LOVENOX) injection  40 mg Subcutaneous Q24H   furosemide  40 mg Intravenous Daily   metoprolol tartrate  25 mg Oral BID   pantoprazole  40 mg Oral Daily   Continuous Infusions:  azithromycin Stopped (08/01/22 1015)   dextrose 75 mL/hr at 08/01/22 1212   fentaNYL infusion INTRAVENOUS Stopped (07/31/22 0853)   piperacillin-tazobactam (ZOSYN)  IV Stopped (08/01/22 1058)    Procedures/Studies: DG Chest Port 1 View  Result Date: 08/01/2022 CLINICAL DATA:  Fever EXAM: PORTABLE CHEST 1 VIEW COMPARISON:  Chest radiograph 07/31/2022 FINDINGS: No pleural effusion. No pneumothorax. Normal lung volumes. Unchanged cardiac and mediastinal contours. Compared to prior exam there is slight interval increase in conspicuity of a retrocardiac opacity, which could represent atelectasis, but infection is not excluded. No displaced rib fractures. The visualized upper abdomen is unremarkable. IMPRESSION: Compared to prior exam there is slight interval increase in conspicuity of a retrocardiac opacity, which could represent atelectasis, but infection is not excluded. Electronically Signed   By: Marin Roberts M.D.   On: 08/01/2022 09:49    DG Chest Port 1 View  Result Date: 07/31/2022 CLINICAL DATA:  Bibasilar crackles. EXAM: PORTABLE CHEST 1 VIEW COMPARISON:  07/31/2022 FINDINGS: Interval removal of enteric and endotracheal tubes. Mild cardiac enlargement. No vascular congestion or edema. Lung bases and costophrenic angles are clear. No pneumothorax. IMPRESSION: Cardiac enlargement.  Lungs appear clear postextubation. Electronically Signed   By: Lucienne Capers M.D.   On: 07/31/2022 23:34   DG Chest Port 1 View  Result Date: 07/31/2022 CLINICAL DATA:  Acute respiratory failure EXAM: PORTABLE CHEST 1 VIEW COMPARISON:  Chest 07/30/2022 FINDINGS: Endotracheal tube 2.5 cm above the carina. NG tube in the stomach with the tip not visualized. Left lower lobe consolidation with mild progression. Mild right lower lobe airspace disease with progression. IMPRESSION: 1. Endotracheal tube in good position. 2. Progression of bibasilar airspace disease left greater than right. Electronically Signed   By: Franchot Gallo M.D.   On: 07/31/2022 14:01   DG Chest Port 1 View  Result Date: 07/30/2022 CLINICAL DATA:  Acute on chronic respiratory failure with hypoxemia. EXAM: PORTABLE CHEST 1 VIEW COMPARISON:  07/29/2022 FINDINGS: ET tube tip is above the carina. Enteric tube tip courses below the level of the hemidiaphragms. Unchanged asymmetric elevation of the right hemidiaphragm. Persistent airspace disease within the left lower lobe which appears unchanged from the previous exam. Decreased interstitial edema pattern. IMPRESSION: 1. Stable support apparatus. 2. Persistent left lower lobe airspace disease. 3. Decreased interstitial edema pattern. Electronically Signed   By: Kerby Moors M.D.   On: 07/30/2022 08:54   ECHOCARDIOGRAM COMPLETE  Result Date: 07/29/2022    ECHOCARDIOGRAM REPORT   Patient Name:   Summer Christensen Date of Exam: 07/29/2022 Medical Rec #:  [id number]     Height:       64.0 in Accession #:    [id number]    Weight:       145.7  lb Date of Birth:  [birth year]     BSA:          1.710 m Patient Age:    47 years      BP:  102/69 mmHg Patient Gender: F             HR:           95 bpm. Exam Location:  [facility] Procedure: 2D Echo, Color Doppler, Cardiac Doppler and Intracardiac            Opacification Agent Indications:    I95.18 Acute systolic (congestive) heart failure  History:        Patient has prior history of Echocardiogram examinations, most                 recent 01/08/2011. Arrythmias:LBBB; Risk Factors:Hypertension and                 Diabetes.  Sonographer:    Raquel Sarna Senior RDCS Referring Phys: [id number] OLADAPO ADEFESO  Sonographer Comments: Scanned supine on artificial respirator. IMPRESSIONS  1. Left ventricular ejection fraction, by estimation, is <20%. The left ventricle has severely decreased function. The left ventricle demonstrates global hypokinesis. The left ventricular internal cavity size was moderately dilated. Left ventricular diastolic parameters are consistent with Grade III diastolic dysfunction (restrictive).  2. Right ventricular systolic function is normal. The right ventricular size is normal.  3. The mitral valve is normal in structure. Mild to moderate mitral valve regurgitation. No evidence of mitral stenosis.  4. The aortic valve is tricuspid. Aortic valve regurgitation is not visualized. No aortic stenosis is present.  5. The inferior vena cava is normal in size with greater than 50% respiratory variability, suggesting right atrial pressure of 3 mmHg. FINDINGS  Left Ventricle: Left ventricular ejection fraction, by estimation, is <20%. The left ventricle has severely decreased function. The left ventricle demonstrates global hypokinesis. Definity contrast agent was given IV to delineate the left ventricular endocardial borders. The left ventricular internal cavity size was moderately dilated. There is no left ventricular hypertrophy. Abnormal (paradoxical) septal motion, consistent with left bundle  branch block. Left ventricular diastolic parameters are consistent with Grade III diastolic dysfunction (restrictive). Right Ventricle: The right ventricular size is normal. No increase in right ventricular wall thickness. Right ventricular systolic function is normal. Left Atrium: Left atrial size was normal in size. Right Atrium: Right atrial size was normal in size. Pericardium: There is no evidence of pericardial effusion. Mitral Valve: The mitral valve is normal in structure. Mild to moderate mitral valve regurgitation. No evidence of mitral valve stenosis. Tricuspid Valve: The tricuspid valve is normal in structure. Tricuspid valve regurgitation is not demonstrated. No evidence of tricuspid stenosis. Aortic Valve: The aortic valve is tricuspid. Aortic valve regurgitation is not visualized. No aortic stenosis is present. Pulmonic Valve: The pulmonic valve was normal in structure. Pulmonic valve regurgitation is mild. No evidence of pulmonic stenosis. Aorta: The aortic root is normal in size and structure. Venous: The inferior vena cava is normal in size with greater than 50% respiratory variability, suggesting right atrial pressure of 3 mmHg. IAS/Shunts: No atrial level shunt detected by color flow Doppler.  LEFT VENTRICLE PLAX 2D LVIDd:         5.90 cm      Diastology LVIDs:         4.50 cm      LV e' medial:    4.90 cm/s LV PW:         1.00 cm      LV E/e' medial:  20.8 LV IVS:        0.90 cm      LV e' lateral:   7.94 cm/s LVOT diam:  1.90 cm      LV E/e' lateral: 12.8 LV SV:         30 LV SV Index:   18 LVOT Area:     2.84 cm  LV Volumes (MOD) LV vol d, MOD A2C: 131.0 ml LV vol d, MOD A4C: 114.0 ml LV vol s, MOD A2C: 114.0 ml LV vol s, MOD A4C: 102.0 ml LV SV MOD A2C:     17.0 ml LV SV MOD A4C:     114.0 ml LV SV MOD BP:      14.8 ml RIGHT VENTRICLE RV S prime:     9.03 cm/s TAPSE (M-mode): 1.9 cm LEFT ATRIUM             Index        RIGHT ATRIUM           Index LA diam:        4.00 cm 2.34 cm/m   RA  Area:     12.70 cm LA Vol (A2C):   56.3 ml 32.92 ml/m  RA Volume:   26.30 ml  15.38 ml/m LA Vol (A4C):   39.7 ml 23.22 ml/m LA Biplane Vol: 48.8 ml 28.54 ml/m  AORTIC VALVE LVOT Vmax:   92.70 cm/s LVOT Vmean:  62.300 cm/s LVOT VTI:    0.106 m  AORTA Ao Root diam: 3.00 cm Ao Asc diam:  3.30 cm MITRAL VALVE MV Area (PHT): 5.50 cm     SHUNTS MV Decel Time: 138 msec     Systemic VTI:  0.11 m MV E velocity: 102.00 cm/s  Systemic Diam: 1.90 cm MV A velocity: 30.00 cm/s MV E/A ratio:  3.40 Candee Furbish MD Electronically signed by Candee Furbish MD Signature Date/Time: 07/29/2022/10:33:16 AM    Final    DG Chest Portable 1 View  Result Date: 07/29/2022 CLINICAL DATA:  NG tube placement. EXAM: PORTABLE CHEST 1 VIEW COMPARISON:  07/29/2022 FINDINGS: 0451 hours. Endotracheal tube tip is 1.3 cm above the base of the carina. The NG tube passes into the stomach although the distal tip position is not included on the film. The cardio pericardial silhouette is enlarged. Vascular congestion noted with underlying chronic interstitial coarsening. Mild bibasilar atelectasis noted without substantial pleural effusion. Telemetry leads overlie the chest. IMPRESSION: Endotracheal tube tip is 1.3 cm above the base of the carina. NG tube passes into the stomach. Cardiomegaly with vascular congestion and basilar atelectasis. Electronically Signed   By: Misty Stanley M.D.   On: 07/29/2022 05:30   CT Angio Chest/Abd/Pel for Dissection W and/or Wo Contrast  Addendum Date: 07/29/2022   ADDENDUM REPORT: 07/29/2022 05:09 ADDENDUM: ETT position and other salient findings discussed by telephone with Dr. Veryl Speak on 07/29/2022 at 0501 hours. Electronically Signed   By: Genevie Ann M.D.   On: 07/29/2022 05:09   Result Date: 07/29/2022 CLINICAL DATA:  71 year old female unresponsive at home. Altered mental status. Intubated. EXAM: CT ANGIOGRAPHY CHEST, ABDOMEN AND PELVIS TECHNIQUE: Non-contrast CT of the chest was initially obtained.  Multidetector CT imaging through the chest, abdomen and pelvis was performed using the standard protocol during bolus administration of intravenous contrast. Multiplanar reconstructed images and MIPs were obtained and reviewed to evaluate the vascular anatomy. RADIATION DOSE REDUCTION: This exam was performed according to the departmental dose-optimization program which includes automated exposure control, adjustment of the mA and/or kV according to patient size and/or use of iterative reconstruction technique. CONTRAST:  155m OMNIPAQUE IOHEXOL 350 MG/ML SOLN COMPARISON:  Portable chest  0347 hours today. CTA chest 01/06/2011. CT Abdomen and Pelvis 12/25/2021. FINDINGS: CTA CHEST FINDINGS Cardiovascular: Calcified aortic atherosclerosis. Calcified coronary artery atherosclerosis. Mild cardiomegaly appears stable since March. No pericardial effusion. Following contrast there is no thoracic aorta dissection. Incidental cardiac pulsation artifact. No thoracic aortic aneurysm. Also, the central pulmonary arteries are enhancing and appear to be patent. Mediastinum/Nodes: Negative. Lungs/Pleura: Intubated, endotracheal tube tip terminates just at the right mainstem bronchus on series 10, image 72. Atelectatic changes to the airways at the carina. Dependent, confluent pulmonary consolidation in both lower lobes, greater on the left. Early dependent upper lobe involvement. Small pulmonary arterial branches appear to be enhancing in the areas of consolidation. Superimposed perihilar and centrilobular pulmonary ground-glass opacity in both lungs with some septal thickening. Trace layering pleural effusions. Musculoskeletal: Respiratory motion at the sternum and ribs. No acute osseous abnormality identified. Review of the MIP images confirms the above findings. CTA ABDOMEN AND PELVIS FINDINGS VASCULAR Mild Aortoiliac calcified atherosclerosis. Negative for abdominal aortic aneurysm or dissection. Major arterial structures are  patent. Portal venous system also appears to be patent. Review of the MIP images confirms the above findings. NON-VASCULAR Hepatobiliary: Chronically absent gallbladder. Liver appears stable and negative when allowing for motion artifact. Pancreas: Stable, negative. Spleen: Within normal limits. Adrenals/Urinary Tract: Stable, negative adrenal glands. Asymmetric right renal cortical scarring and volume loss. Symmetric renal enhancement. Motion artifact. No convincing hydronephrosis or pararenal inflammation. Ureters appear decompressed. No obvious urinary calculus. Unremarkable bladder. Stomach/Bowel: Intermittent motion artifact. Redundant but decompressed large bowel except for retained stool in the transverse colon. Normal retrocecal appendix on series 6, image 152. No dilated small bowel. Small volume of air in fluid in the stomach. Negative duodenum. No free air, free fluid, or mesenteric inflammation identified. Lymphatic: No lymphadenopathy. Reproductive: Chronically absent uterus, diminutive or absent ovaries. Other: No pelvic free fluid. Musculoskeletal: No acute osseous abnormality identified. Review of the MIP images confirms the above findings. IMPRESSION: 1. Intubated, endotracheal tube tip terminates just inside the right mainstem bronchus. Recommend retracting 1 cm. 2. Negative for aortic dissection or aneurysm. Aortic Atherosclerosis (ICD10-I70.0). 3. Positive for dependent consolidation in both lungs, especially lower lobes greater on the left. This is most compatible with Pneumonia Versus Aspiration. 4. Trace layering pleural effusions and additional central pulmonary ground-glass opacity with septal thickening raising the possibility of mild or developing interstitial edema. Stable cardiomegaly. No pericardial effusion. 5. No acute or inflammatory process identified in the abdomen or pelvis. Chronic right renal cortical scarring. Electronically Signed: By: Genevie Ann M.D. On: 07/29/2022 04:57   CT  Head Wo Contrast  Result Date: 07/29/2022 CLINICAL DATA:  71 year old female unresponsive at home. Altered mental status. Intubated. EXAM: CT HEAD WITHOUT CONTRAST TECHNIQUE: Contiguous axial images were obtained from the base of the skull through the vertex without intravenous contrast. RADIATION DOSE REDUCTION: This exam was performed according to the departmental dose-optimization program which includes automated exposure control, adjustment of the mA and/or kV according to patient size and/or use of iterative reconstruction technique. COMPARISON:  Head CT 08/03/2009. FINDINGS: Brain: Cerebral volume is within normal limits for age. No midline shift, ventriculomegaly, mass effect, evidence of mass lesion, intracranial hemorrhage or evidence of cortically based acute infarction. Gray-white matter differentiation is within normal limits for age. No convincing encephalomalacia. Vascular: Calcified atherosclerosis at the skull base. No suspicious intracranial vascular hyperdensity. Skull: Stable.  No acute osseous abnormality identified. Sinuses/Orbits: Visualized paranasal sinuses and mastoids are stable and well aerated. Other: Visualized orbits and scalp  soft tissues are within normal limits. IMPRESSION: Normal for age non contrast CT appearance of the brain, head. Electronically Signed   By: Genevie Ann M.D.   On: 07/29/2022 04:48   DG Chest Portable 1 View  Result Date: 07/29/2022 CLINICAL DATA:  71 year old female with shortness of breath. EXAM: PORTABLE CHEST 1 VIEW COMPARISON:  Chest radiographs 12/31/2011 and earlier. CT Abdomen and Pelvis 12/25/2021. FINDINGS: Portable AP semi upright view at 0347 hours. Cardiomegaly, new since 2013 but appears stable from the CT Abdomen and Pelvis this year. Other mediastinal contours are within normal limits. Visualized tracheal air column is within normal limits. Lung volumes are within normal limits. No pneumothorax, pleural effusion or consolidation. Mild streaky  bilateral perihilar opacity. Increased pulmonary vascularity from prior radiographs, but no overt edema. No acute osseous abnormality identified. Paucity of bowel gas in the upper abdomen. IMPRESSION: 1. Cardiomegaly with vascular congestion but no overt edema. 2. Streaky bilateral perihilar opacity, nonspecific but favor atelectasis. Electronically Signed   By: Genevie Ann M.D.   On: 07/29/2022 04:00    Barton Dubois, MD [facility]  If 7PM-7AM, please contact night-coverage [email or web address] Password [email or web address] 08/01/2022, 12:21 PM   LOS: 3 days

## 2022-08-02 DIAGNOSIS — I5021 Acute systolic (congestive) heart failure: Secondary | ICD-10-CM | POA: Diagnosis not present

## 2022-08-02 DIAGNOSIS — E785 Hyperlipidemia, unspecified: Secondary | ICD-10-CM

## 2022-08-02 DIAGNOSIS — E1169 Type 2 diabetes mellitus with other specified complication: Secondary | ICD-10-CM

## 2022-08-02 DIAGNOSIS — I10 Essential (primary) hypertension: Secondary | ICD-10-CM | POA: Diagnosis not present

## 2022-08-02 DIAGNOSIS — J9601 Acute respiratory failure with hypoxia: Secondary | ICD-10-CM | POA: Diagnosis not present

## 2022-08-02 LAB — CBC
HCT: 38.8 % (ref 36.0–46.0)
Hemoglobin: 12.6 g/dL (ref 12.0–15.0)
MCH: 28.1 pg (ref 26.0–34.0)
MCHC: 32.5 g/dL (ref 30.0–36.0)
MCV: 86.6 fL (ref 80.0–100.0)
Platelets: 164 10*3/uL (ref 150–400)
RBC: 4.48 MIL/uL (ref 3.87–5.11)
RDW: 12.9 % (ref 11.5–15.5)
WBC: 15.4 10*3/uL — ABNORMAL HIGH (ref 4.0–10.5)
nRBC: 0 % (ref 0.0–0.2)

## 2022-08-02 LAB — BASIC METABOLIC PANEL
Anion gap: 9 (ref 5–15)
BUN: 18 mg/dL (ref 8–23)
CO2: 25 mmol/L (ref 22–32)
Calcium: 9.2 mg/dL (ref 8.9–10.3)
Chloride: 108 mmol/L (ref 98–111)
Creatinine, Ser: 1.05 mg/dL — ABNORMAL HIGH (ref 0.44–1.00)
GFR, Estimated: 57 mL/min — ABNORMAL LOW (ref >60)
Glucose, Bld: 127 mg/dL — ABNORMAL HIGH (ref 70–99)
Potassium: 3.8 mmol/L (ref 3.5–5.1)
Sodium: 142 mmol/L (ref 135–145)

## 2022-08-02 LAB — TRIGLYCERIDES: Triglycerides: 123 mg/dL (ref <150)

## 2022-08-02 MED ORDER — LOSARTAN POTASSIUM 50 MG PO TABS
25.0000 mg | ORAL_TABLET | Freq: Every day | ORAL | Status: DC
  Administered 2022-08-03 – 2022-08-04 (×2): 25 mg via ORAL
  Filled 2022-08-02 (×2): qty 1

## 2022-08-02 MED ORDER — CARVEDILOL 3.125 MG PO TABS
3.1250 mg | ORAL_TABLET | Freq: Two times a day (BID) | ORAL | Status: DC
  Administered 2022-08-02 – 2022-08-04 (×4): 3.125 mg via ORAL
  Filled 2022-08-02 (×4): qty 1

## 2022-08-02 MED ORDER — SPIRONOLACTONE 12.5 MG HALF TABLET
12.5000 mg | ORAL_TABLET | Freq: Every day | ORAL | Status: DC
  Administered 2022-08-02 – 2022-08-04 (×3): 12.5 mg via ORAL
  Filled 2022-08-02 (×3): qty 1

## 2022-08-02 MED ORDER — AMOXICILLIN-POT CLAVULANATE 875-125 MG PO TABS
1.0000 | ORAL_TABLET | Freq: Two times a day (BID) | ORAL | Status: AC
  Administered 2022-08-02 – 2022-08-03 (×2): 1 via ORAL
  Filled 2022-08-02 (×2): qty 1

## 2022-08-02 MED ORDER — FUROSEMIDE 40 MG PO TABS
40.0000 mg | ORAL_TABLET | Freq: Every day | ORAL | Status: DC
  Administered 2022-08-03 – 2022-08-04 (×2): 40 mg via ORAL
  Filled 2022-08-02 (×2): qty 1

## 2022-08-02 NOTE — TOC Initial Note (Signed)
Transition of Care [facility]) - Initial/Assessment Note    Patient Details  Name: Summer Christensen MRN: [id number] Date of Birth: [birth year]  Transition of [facility]) CM/SW Contact:    Shade Flood, LCSW Phone Number: 08/02/2022, 3:41 PM  Clinical Narrative:                  Pt admitted from home. PT recommending HHPT and RW at dc. Spoke with pt/dtr today to assess and review PT recommendations. Pt and dtr agreeable to [facility] and RW at dc. CMS provider options reviewed. Pt/family have no preference on agency.   MD anticipating dc Monday. TOC will refer for [facility] and RW Monday AM.  Expected Discharge Plan: [place] Barriers to Discharge: Continued Medical Work up   Patient Goals and CMS Choice Patient states their goals for this hospitalization and ongoing recovery are:: go home CMS Medicare.gov Compare Post Acute Care list provided to:: Patient Represenative (must comment) Choice offered to / list presented to : Adult Children  Expected Discharge Plan and Services Expected Discharge Plan: [place] In-house Referral: Clinical Social Work   Post Acute Care Choice: Durable Medical Equipment, Home Health Living arrangements for the past 2 months: [place]                                      Prior Living Arrangements/Services Living arrangements for the past 2 months: Single Family Home Lives with:: Adult Children Patient language and need for interpreter reviewed:: Yes Do you feel safe going back to the place where you live?: Yes      Need for Family Participation in Patient Care: Yes (Comment) Care giver support system in place?: Yes (comment)   Criminal Activity/Legal Involvement Pertinent to Current Situation/Hospitalization: No - Comment as needed  Activities of Daily Living   ADL Screening (condition at time of admission) Patient's cognitive ability adequate to safely complete daily activities?: Yes Is the patient deaf or  have difficulty hearing?: No Does the patient have difficulty seeing, even when wearing glasses/contacts?: No Does the patient have difficulty concentrating, remembering, or making decisions?: No Patient able to express need for assistance with ADLs?: Yes Does the patient have difficulty dressing or bathing?: No Independently performs ADLs?: Yes (appropriate for developmental age) Does the patient have difficulty walking or climbing stairs?: No Weakness of Legs: None Weakness of Arms/Hands: None  Permission Sought/Granted Permission sought to share information with : Facility Art therapist granted to share information with : Yes, Verbal Permission Granted     Permission granted to share info w AGENCY: HH/DME        Emotional Assessment       Orientation: : Oriented to Self, Oriented to Place, Oriented to  Time, Oriented to Situation Alcohol / Substance Use: Not Applicable Psych Involvement: No (comment)  Admission diagnosis:  Acute respiratory failure with hypoxia ([place]) [J96.01] Patient Active Problem List   Diagnosis Date Noted   Acute combined systolic and diastolic heart failure (HCC)    Hypokalemia 07/30/2022   Acute respiratory failure with hypoxia and hypercarbia ([place]) 07/29/2022   Sepsis due to undetermined organism ([place]) 07/29/2022   Hypertensive emergency 07/29/2022   Pleural effusion 07/29/2022   Elevated brain natriuretic peptide (BNP) level 07/29/2022   Lobar pneumonia ([place]) 07/29/2022   Elevated troponin 07/29/2022   Controlled type 2 diabetes mellitus with hyperglycemia ([place]) 07/29/2022  Anxiety disorder 01/14/2021   Chronic pain syndrome 01/14/2021   Gastroesophageal reflux disease 01/14/2021   Intra-abdominal and pelvic swelling, mass and lump, unspecified site 01/14/2021   Major depression, single episode 01/14/2021   Sciatica 01/14/2021   Essential hypertension    LEFT BUNDLE BRANCH BLOCK 12/16/2010   MIGRAINE HEADACHE 12/03/2010    CHEST PAIN 12/03/2010   PCP:  Smith Robert, MD Pharmacy:   [facility] - [place], [place] - [address] [place] [address] Phone: [phone] Fax: [phone]     Social Determinants of Health (SDOH) Interventions    Readmission Risk Interventions     No data to display

## 2022-08-02 NOTE — Evaluation (Signed)
Physical Therapy Evaluation Patient Details Name: Summer Christensen MRN: [id number] DOB: Sep 09, 1951 Today's Date: 08/02/2022  History of Present Illness  Summer Christensen is a 71 y.o. female with medical history significant of hypertension, hyperlipidemia who presents to the emergency department by private vehicle due to shortness of breath.  Patient was already intubated and sedated, so she was unable to provide history, history was provided dated by son.  Per son, patient woke up in the course of the night complaining of not being able to breathe and asked him to take her to the ER.  By the time patient arrived at the ER, she was altered, obtunded and was in severe respiratory distress.  O2 sat was noted to be 40% on arrival to the ED.   Clinical Impression  Patient demonstrates slightly labored movement during bed mobility, able to ambulate in room/hallway without AD, but occasional drifting left/right and loses balance when distracted requiring hand held assist for safety and safer using RW with good return for use demonstrated.  Patient on room air during ambulation with SpO2 dropping from 94% to 86% and put on 2 LPM once seated in chair with SpO2 increasing to 96% - nursing staff notified.  Patient tolerated sitting up in chair after therapy with family members present in room.  Patient will benefit from continued skilled physical therapy in hospital and recommended venue below to increase strength, balance, endurance for safe ADLs and gait.       Recommendations for follow up therapy are one component of a multi-disciplinary discharge planning process, led by the attending physician.  Recommendations may be updated based on patient status, additional functional criteria and insurance authorization.  Follow Up Recommendations Home health PT      Assistance Recommended at Discharge Set up Supervision/Assistance  Patient can return home with the following  A little help with walking and/or  transfers;Help with stairs or ramp for entrance;Assistance with cooking/housework;A little help with bathing/dressing/bathroom    Equipment Recommendations Rolling walker (2 wheels)  Recommendations for Other Services       Functional Status Assessment Patient has had a recent decline in their functional status and demonstrates the ability to make significant improvements in function in a reasonable and predictable amount of time.     Precautions / Restrictions Precautions Precautions: Fall Restrictions Weight Bearing Restrictions: No      Mobility  Bed Mobility Overal bed mobility: Needs Assistance Bed Mobility: Supine to Sit     Supine to sit: Supervision     General bed mobility comments: slightly labored movement, increased time    Transfers Overall transfer level: Needs assistance Equipment used: None, Rolling walker (2 wheels) Transfers: Sit to/from Stand, Bed to chair/wheelchair/BSC Sit to Stand: Supervision   Step pivot transfers: Supervision       General transfer comment: increased time, labored movement    Ambulation/Gait Ambulation/Gait assistance: Min guard, Min assist Gait Distance (Feet): 80 Feet Assistive device: None, Rolling walker (2 wheels) Gait Pattern/deviations: Decreased step length - right, Decreased step length - left, Decreased stride length, Staggering right, Staggering left Gait velocity: decreased     General Gait Details: slightly labored cadence with frequent leaning on side rails and tendency to stagger left/right with near loss of balance when distracted walking without AD, safer using RW good return for use demonstrated without loss of balance  Stairs            Wheelchair Mobility    Modified Rankin (Stroke Patients Only)  Balance Overall balance assessment: Needs assistance Sitting-balance support: Feet supported, No upper extremity supported Sitting balance-Leahy Scale: Good Sitting balance - Comments:  seated at EOB   Standing balance support: During functional activity, No upper extremity supported Standing balance-Leahy Scale: Fair Standing balance comment: fair/good using RW                             Pertinent Vitals/Pain Pain Assessment Pain Assessment: No/denies pain    Home Living Family/patient expects to be discharged to:: Private residence Living Arrangements: Children Available Help at Discharge: Family;Available 24 hours/day Type of Home: House Home Access: Stairs to enter Entrance Stairs-Rails: None Entrance Stairs-Number of Steps: 2   Home Layout: One level Home Equipment: None      Prior Function Prior Level of Function : Independent/Modified Independent;Driving             Mobility Comments: Community ambulator without AD, drives ADLs Comments: Independent     Hand Dominance        Extremity/Trunk Assessment   Upper Extremity Assessment Upper Extremity Assessment: Overall WFL for tasks assessed    Lower Extremity Assessment Lower Extremity Assessment: Generalized weakness    Cervical / Trunk Assessment Cervical / Trunk Assessment: Normal  Communication   Communication: No difficulties  Cognition Arousal/Alertness: Awake/alert Behavior During Therapy: WFL for tasks assessed/performed Overall Cognitive Status: Within Functional Limits for tasks assessed                                          General Comments      Exercises     Assessment/Plan    PT Assessment Patient needs continued PT services  PT Problem List Decreased strength;Decreased activity tolerance;Decreased balance;Decreased mobility       PT Treatment Interventions DME instruction;Gait training;Stair training;Functional mobility training;Therapeutic activities;Therapeutic exercise;Balance training;Patient/family education    PT Goals (Current goals can be found in the Care Plan section)  Acute Rehab PT Goals Patient Stated Goal:  return home with family to assist PT Goal Formulation: With patient/family Time For Goal Achievement: 08/04/22 Potential to Achieve Goals: Good    Frequency Min 3X/week     Co-evaluation               AM-PAC PT "6 Clicks" Mobility  Outcome Measure Help needed turning from your back to your side while in a flat bed without using bedrails?: None Help needed moving from lying on your back to sitting on the side of a flat bed without using bedrails?: None Help needed moving to and from a bed to a chair (including a wheelchair)?: A Little Help needed standing up from a chair using your arms (e.g., wheelchair or bedside chair)?: A Little Help needed to walk in hospital room?: A Little Help needed climbing 3-5 steps with a railing? : A Little 6 Click Score: 20    End of Session Equipment Utilized During Treatment: Oxygen Activity Tolerance: Patient tolerated treatment well;Patient limited by fatigue Patient left: in chair;with call bell/phone within reach;with family/visitor present Nurse Communication: Mobility status PT Visit Diagnosis: Unsteadiness on feet (R26.81);Other abnormalities of gait and mobility (R26.89);Muscle weakness (generalized) (M62.81)    Time: 8832-5498 PT Time Calculation (min) (ACUTE ONLY): 32 min   Charges:   PT Evaluation $PT Eval Moderate Complexity: 1 Mod PT Treatments $Therapeutic Activity: 23-37 mins  12:50 PM, 08/02/22 Ocie Bob, MPT Physical Therapist with [facility] [phone] office [phone] mobile phone

## 2022-08-02 NOTE — Assessment & Plan Note (Addendum)
Severe sepsis present on admission.  Right lower lobe interstitial infiltrate.  CT chest with bilateral consolidation at bases, more left than right. Bilateral ground glass opacities.  Elevated troponin due to severe sepsis and demand ischemia.  -Currently afebrile, WBCs trending down and patient demonstrating good oxygen saturation on 2 L supplementation. -Continue treatment with oral Augmentin to complete antibiotic therapy. -Patient had diuresis with furosemide, for possible component of acute pulmonary edema.  -Continue daily weight, strict intake and output and continue management by cardiology service for component of acute combined heart failure.

## 2022-08-02 NOTE — Progress Notes (Signed)
Called to patient's room by family member. Daughter was doing pt's hair and noted that pt has sore area where her hair was up in a bun. Upon inspection, pt has 3cm x 2cm dark purple area to occiput of skull. Painful to touch. Skin intact, no dressing necessary. Advised daughter to ensure with braiding pt's hair to avoid any pressure on this area and daughter states understanding. MD Arrien notified of discovery of new pressure ulcer.

## 2022-08-02 NOTE — Plan of Care (Signed)
  Problem: Acute Rehab PT Goals(only PT should resolve) Goal: Pt Will Go Supine/Side To Sit Outcome: Progressing Flowsheets (Taken 08/02/2022 1252) Pt will go Supine/Side to Sit:  Independently  with modified independence Goal: Patient Will Transfer Sit To/From Stand Outcome: Progressing Flowsheets (Taken 08/02/2022 1252) Patient will transfer sit to/from stand: with modified independence Goal: Pt Will Transfer Bed To Chair/Chair To Bed Outcome: Progressing Flowsheets (Taken 08/02/2022 1252) Pt will Transfer Bed to Chair/Chair to Bed: with modified independence Goal: Pt Will Ambulate Outcome: Progressing Flowsheets (Taken 08/02/2022 1252) Pt will Ambulate:  > 125 feet  with moderate assist  with rolling walker   12:52 PM, 08/02/22 Lonell Grandchild, MPT Physical Therapist with [facility] [phone] office [phone] mobile phone

## 2022-08-03 DIAGNOSIS — E1165 Type 2 diabetes mellitus with hyperglycemia: Secondary | ICD-10-CM | POA: Diagnosis not present

## 2022-08-03 DIAGNOSIS — A419 Sepsis, unspecified organism: Secondary | ICD-10-CM | POA: Diagnosis not present

## 2022-08-03 DIAGNOSIS — I5041 Acute combined systolic (congestive) and diastolic (congestive) heart failure: Secondary | ICD-10-CM | POA: Diagnosis not present

## 2022-08-03 DIAGNOSIS — J9601 Acute respiratory failure with hypoxia: Secondary | ICD-10-CM | POA: Diagnosis not present

## 2022-08-03 LAB — CBC
HCT: 36.3 % (ref 36.0–46.0)
Hemoglobin: 12.1 g/dL (ref 12.0–15.0)
MCH: 28.5 pg (ref 26.0–34.0)
MCHC: 33.3 g/dL (ref 30.0–36.0)
MCV: 85.6 fL (ref 80.0–100.0)
Platelets: 199 10*3/uL (ref 150–400)
RBC: 4.24 MIL/uL (ref 3.87–5.11)
RDW: 12.6 % (ref 11.5–15.5)
WBC: 13.6 10*3/uL — ABNORMAL HIGH (ref 4.0–10.5)
nRBC: 0 % (ref 0.0–0.2)

## 2022-08-03 LAB — BASIC METABOLIC PANEL
Anion gap: 10 (ref 5–15)
BUN: 19 mg/dL (ref 8–23)
CO2: 26 mmol/L (ref 22–32)
Calcium: 9.3 mg/dL (ref 8.9–10.3)
Chloride: 105 mmol/L (ref 98–111)
Creatinine, Ser: 1.01 mg/dL — ABNORMAL HIGH (ref 0.44–1.00)
GFR, Estimated: 60 mL/min — ABNORMAL LOW (ref >60)
Glucose, Bld: 128 mg/dL — ABNORMAL HIGH (ref 70–99)
Potassium: 3.1 mmol/L — ABNORMAL LOW (ref 3.5–5.1)
Sodium: 141 mmol/L (ref 135–145)

## 2022-08-03 MED ORDER — POTASSIUM CHLORIDE CRYS ER 20 MEQ PO TBCR
40.0000 meq | EXTENDED_RELEASE_TABLET | Freq: Once | ORAL | Status: AC
  Administered 2022-08-03: 40 meq via ORAL
  Filled 2022-08-03: qty 2

## 2022-08-03 MED ORDER — ACETAMINOPHEN 325 MG PO TABS
650.0000 mg | ORAL_TABLET | Freq: Four times a day (QID) | ORAL | Status: DC | PRN
  Administered 2022-08-03: 650 mg via ORAL
  Filled 2022-08-03: qty 2

## 2022-08-03 NOTE — Progress Notes (Signed)
Progress Note   Patient: Summer Christensen MRN:[id number] DOB: 07/23/1951 DOA: 07/29/2022     5 DOS: the patient was seen and examined on 08/03/2022   Brief hospital course: 70-year-old female with a history of hypertension, hyperlipidemia, left bundle branch block, migraine headache presenting with respiratory distress.     History is obtained from speaking with the patient's son at the bedside and review of the medical record.  Apparently, the patient had been in her usual state of health up until the early morning 07/29/2022.  The patient woke up with shortness of breath.  She was brought to the emergency department by her son.  Upon arrival, the patient was noted to be obtunded in respiratory distress with saturation 48% on room air.  She was placed on nonrebreather with improvement to 64%.  She was subsequently intubated. Son and daughter at the bedside stated that the patient had been in her usual state of health without any complaints up until the time of admission.  She had not had any fevers, chills, headache, chest pain, shortness of breath, coughing, hemoptysis, nausea, vomiting.  In the ED, the patient was afebrile and hemodynamically stable.  She was intubated and sedated on the ventilator.  WBC 25.0, hemoglobin 14.1, platelets 254,000.  Sodium 141, potassium 3.6, CO2 20, serum creatinine 0.94.  AST 36, ALT 33, alk phosphatase 87, total bilirubin 0.7.  BNP 1157.  Troponin 17>> 128.  CT a chest was negative for pulmonary embolus or dissection.    There was bilateral lower lobe consolidation, left greater than right with early upper lobe involvement.  There was superimposed perihilar and centrilobular pulmonary GGO with septal thickening.  CT of the abdomen and pelvis showed right renal cortical scarring.  There was retained stool in the transverse colon.  Otherwise there is no other acute intra-abdominal abnormalities.    The patient was started on vancomycin and Zosyn.  Patient was  liberated from mechanical ventilation on 07/31/22.   Assessment and Plan: Acute respiratory failure with hypoxia and hypercarbia (HCC) Severe sepsis present on admission.  Right lower lobe interstitial infiltrate.  CT chest with bilateral consolidation at bases, more left than right.  Bilateral ground glass opacities.  Elevated troponin due to severe sepsis.   Oxymetry today is 94% on 2 L/min per [place]  Wbc 15.4   Plan to continue antibiotic therapy with oral Augmentin for one more day to complete 5 days.   Patient had diuresis with furosemide, for possible component of acute pulmonary edema.    Acute clinical systolic heart failure (HCC) Echocardiogram with reduced LV systolic function EF <20%, with global hypokinesis. RV systolic function preserved.  No significant valvular disease.   Urine output is 3,050 ml Systolic blood pressure 132 to 117 mmHg.   Plan to continue diuresis with furosemide, transition to oral 40 mg daily. Continue with empagliflozin, change metoprolol to carvedilol.  Add spironolactone and ARB Patient will need ischemic work up for new systolic heart failure, consult cardiology on Monday.   Type 2 diabetes mellitus with hyperlipidemia (HCC) Continue glucose cover and monitoring.  Patient is tolerating po well.   Continue with statin therapy.   Lobar pneumonia (HCC) -Procalcitonin adequately trending down -MRSA PCR negative; vancomycin discontinued -Continue Zosyn and Zithromax -Continue supportive care and follow culture results. -COVID and influenza PCR negative. -Continue the use of flutter valve and incentive spirometer -Continue to wean oxygen supplementation as tolerated -Patient successfully extubated on 07/31/2022 -Physical therapy evaluation requested.  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 -EKG without acute ischemic changes -2D echo demonstrating ejection  fraction around 20% -Cardiology service consultation appreciated; feel patient experiencing nonischemic cardiomyopathy process in the setting of sepsis. -Continue Lasix and metoprolol; Jardiance has been initiated today as well (08/01/2022).. -Will follow further recommendations/GDMT adjustments by cardiology service...   Essential hypertension Resume low dose losartan and add carvedilol. Continue diuresis with furosemide, spironolactone and empagliflozin.   Hypokalemia Renal function with serum cr at 1,0 with K at 3,8 and serum bicarbonate at 25. Continue diuresis, with loop diuretic, SGLT 2 and add spironolactone. Follow up renal function in am,   Elevated brain natriuretic peptide (BNP) level -BNP 1157 -Vascular congestion and interstitial edema appreciated on chest images at time of admission. -Repeat chest x-ray demonstrated improvement in patient's vascular congestion.  Good urine output reported. 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 -Continue to follow daily weights/strict intake and output -Continue metoprolol and Lasix. -Patient started on Jardiance as well.  Sepsis due to undetermined organism (HCC) -Secondary to pneumonia -Sepsis present at time of admission with leukocytosis, tachycardia, respiratory distress -Procalcitonin 0.11>>3.19>>1.83; continue to follow trend.  -Follow culture result -Continue treatment with Zosyn and Zithromax  -Patient is still spiking low-grade fever -Continue as needed antipyretics and cooling blankets.   -Continue supportive care. -Fever curve continue trending down.         Subjective: Patient with improvement in dyspnea, no chest pain, nausea, or vomiting   Physical Exam: Vitals:   08/02/22 1636 08/02/22 2056 08/03/22 0444 08/03/22 0827  BP: 129/68 128/70 122/65 125/77  Pulse: 96 91 85 86  Resp:  18 18   Temp:  99.9 F (37.7  C) 98.6 F (37 C)   TempSrc:  Oral    SpO2:  93% 94%   Weight:   65.4 kg   Height:   5' 4" (1.626 m)    Neurology awake and alert ENT no pallor Cardiovascular S1 and S2 present, no gallops or rubs Respiratory with wheezing or rhonchi Abdomen soft No lower extremity edema  Data Reviewed:    Family Communication: I spoke with patient's son at the bedside, we talked in detail about patient's condition, plan of care and prognosis and all questions were addressed.   Disposition: Status is: Inpatient Remains inpatient appropriate because: heart failure   Planned Discharge Destination: Home    Author: Mauricio Daniel Arrien, MD 08/03/2022 10:33 AM  For on call review [email or web address].  

## 2022-08-03 NOTE — Consult Note (Addendum)
[place] Nurse Consult Note: Reason for Consult:Deep tissue pressure injury to occiput. Patient was critically ill and in ICU on ventilator. Wound type:Pressure Pressure Injury POA: No Measurement: 3cm x 2cm purple/maroon discoloration, not fluctuant Wound bed: N/A Drainage (amount, consistency, odor) None Periwound:intact Dressing procedure/placement/frequency: Day shift nurse assessing lesion after family identified yesterday has instructed the family to avoid braiding hair in this area until tissue recovery has occurred. No dressing is currently indicated. Turning the head from side to side while in the supine position is advised to avoid putting pressure on the affected area. This areas may become dry and hard, mimicking a scab. It is to be left undisturbed until auto removal (it falls off). I have provided Nursing with guidance to paint the area once daily with a povidone-iodine swabstick to prevent infection and hasten drying of tissue.  [place] nursing team will follow while in house, seeing every 7-10 days until resolution.  We will remain available to this patient, the nursing and medical teams throughout her admission.   Thank you for inviting [place] to participate in this patient's Plan of Care.  Maudie Flakes, MSN, RN, CNS, [place], Serita Grammes, [employer], [employer] phone:  [phone]

## 2022-08-03 NOTE — Progress Notes (Signed)
Progress Note   Patient: Summer Christensen MWU:[id number] DOB: January 19, 1951 DOA: 07/29/2022     5 DOS: the patient was seen and examined on 08/03/2022   Brief hospital course: 71 year old female with a history of hypertension, hyperlipidemia, left bundle branch block, migraine headache presenting with respiratory distress.     History is obtained from speaking with the patient's son at the bedside and review of the medical record.  Apparently, the patient had been in her usual state of health up until the early morning 07/29/2022.  The patient woke up with shortness of breath.  She was brought to the emergency department by her son.  Upon arrival, the patient was noted to be obtunded in respiratory distress with saturation 48% on room air.  She was placed on nonrebreather with improvement to 64%.  She was subsequently intubated. Son and daughter at the bedside stated that the patient had been in her usual state of health without any complaints up until the time of admission.  She had not had any fevers, chills, headache, chest pain, shortness of breath, coughing, hemoptysis, nausea, vomiting.  In the ED, the patient was afebrile and hemodynamically stable.  She was intubated and sedated on the ventilator.  WBC 25.0, hemoglobin 14.1, platelets 254,000.  Sodium 141, potassium 3.6, CO2 20, serum creatinine 0.94.  AST 36, ALT 33, alk phosphatase 87, total bilirubin 0.7.  BNP 1157.  Troponin 17>> 128.  CT a chest was negative for pulmonary embolus or dissection.    There was bilateral lower lobe consolidation, left greater than right with early upper lobe involvement.  There was superimposed perihilar and centrilobular pulmonary GGO with septal thickening.  CT of the abdomen and pelvis showed right renal cortical scarring.  There was retained stool in the transverse colon.  Otherwise there is no other acute intra-abdominal abnormalities.    The patient was started on vancomycin and Zosyn.  Patient was  liberated from mechanical ventilation on 07/31/22.   Will check desaturation screening; hopefully discharge on 08/04/2022.  Assessment and Plan: Acute respiratory failure with hypoxia and hypercarbia (HCC) Severe sepsis present on admission.  Right lower lobe interstitial infiltrate.  CT chest with bilateral consolidation at bases, more left than right. Bilateral ground glass opacities.  Elevated troponin due to severe sepsis and demand ischemia.  -Currently afebrile, WBCs trending down and patient demonstrating good oxygen saturation on 2 L supplementation. -Continue treatment with oral Augmentin to complete antibiotic therapy. -Patient had diuresis with furosemide, for possible component of acute pulmonary edema.  -Continue daily weight, strict intake and output and continue management by cardiology service for component of acute combined heart failure.   Acute clinical systolic heart failure (HCC) Echocardiogram with reduced LV systolic function EF <72%, with global hypokinesis. RV systolic function preserved.  No significant valvular disease.  -Good urine output reported; stable renal function. -Continue oral Lasix (40 mg daily), continue Farxiga, carvedilol, spironolactone and ARB -Close monitoring of renal function -Low-sodium diet and adequate hydration discussed with patient. -Continue daily weights and strict I's and O's. -Patient will need ischemic work up for new systolic heart failure, will follow cardiology recommendations on Monday.   Type 2 diabetes mellitus with hyperlipidemia [facility]) -Patient has been started on Farxiga -Continue sliding scale insulin -Follow CBGs fluctuation. -Modified carbohydrate diet and heart healthy discussed with patient. -Continue with statin therapy.   Lobar pneumonia ([place]) -Procalcitonin adequately trending down -MRSA PCR negative; vancomycin discontinued -Continue Zosyn and Zithromax -Continue supportive care and follow culture  results. -  COVID and influenza PCR negative. -Continue the use of flutter valve and incentive spirometer -Continue to wean oxygen supplementation as tolerated -Patient successfully extubated on 07/31/2022 -Physical therapy evaluation requested.  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 -EKG without acute ischemic changes -2D echo demonstrating ejection fraction around 20% -Cardiology service consultation appreciated; feel patient experiencing nonischemic cardiomyopathy process in the setting of sepsis. -Continue Lasix and metoprolol; Jardiance has been initiated today as well (08/01/2022).. -Will follow further recommendations/GDMT adjustments by cardiology service...   Essential hypertension -Resume low dose losartan and carvedilol. -Continue as well diuresis with furosemide, spironolactone and empagliflozin.  -Heart healthy diet discussed with patient. -Vital signs stable.  Hypokalemia -Renal function with serum cr at 1,0 with K at 3,1 and serum bicarbonate at 25. -Continue diuresis, with loop diuretic, SGLT 2 and add spironolactone. -Replete electrolytes and follow potassium trend.   Elevated brain natriuretic peptide (BNP) level -BNP 1157 -Vascular congestion and interstitial edema appreciated on chest images at time of admission. -Repeat chest x-ray demonstrated improvement in patient's vascular congestion.  Good urine output reported. 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 -Continue to follow daily weights/strict intake and output -Continue metoprolol and Lasix. -Patient started on Jardiance as well.  Sepsis due to undetermined organism ([place]) -Secondary to pneumonia -Sepsis present at time of admission with leukocytosis, tachycardia, respiratory distress -Procalcitonin 0.11>>3.19>>1.83;  continue to follow trend.  -Follow culture result -Continue treatment with Zosyn and Zithromax  -Patient is still spiking low-grade fever -Continue as needed antipyretics and cooling blankets.   -Continue supportive care. -Fever curve continue trending down.    Subjective:  Patient reports no chest pain, no nausea, no vomiting, no fever.  Reports breathing continues to improve.  2 L nasal cannula supplementation in place, reporting short winded sensation with activity.  Intermittent coughing spells present.  Physical Exam: Vitals:   08/03/22 0444 08/03/22 0827 08/03/22 1345 08/03/22 1700  BP: 122/65 125/77 112/82 125/71  Pulse: 85 86 94 81  Resp: 18  18   Temp: 98.6 F (37 C)  98.5 F (36.9 C)   TempSrc:   Oral   SpO2: 94%  95%   Weight: 65.4 kg     Height: 5' 4"  (1.626 m)      General exam: Alert, awake, oriented x 3; no chest pain, no nausea, no vomiting, no palpitations.  Reports improvement in her breathing; still experiencing intermittent coughing spells and feeling slightly short winded. Respiratory system: No frank crackles, no wheezing, positive rhonchi and decreased breath sounds at the bases.  Good oxygen saturation on 2 L nasal cannula supplementation. Cardiovascular system:RRR. No rubs or gallops; no JVD. Gastrointestinal system: Abdomen is nondistended, soft and nontender. No organomegaly or masses felt. Normal bowel sounds heard. Central nervous system: Alert and oriented. No focal neurological deficits. Extremities: No cyanosis, clubbing or edema. Skin: No petechiae.  Deep tissue injury appreciated in occipital area; no fluctuation, no open drainage. Psychiatry: Judgement and insight appear normal. Mood & affect appropriate.   Data Reviewed: Basic metabolic panel: Sodium 818, potassium 3.1, chloride 105, bicarb 26, BUN 19, creatinine 1.01 and GFR 60. CBC: WBCs 13.6, hemoglobin 12.1 and platelets count 199K.   Family Communication: No family at bedside on today's  evaluation; all care discussed with patient in detail and all questions answered.   Disposition: Status is: Inpatient  Remains inpatient appropriate because: heart failure  Planned Discharge Destination: Home  Author: Barton Dubois, MD 08/03/2022 5:29 PM  For on call review [email or web address].

## 2022-08-04 DIAGNOSIS — I5021 Acute systolic (congestive) heart failure: Secondary | ICD-10-CM | POA: Diagnosis not present

## 2022-08-04 DIAGNOSIS — J189 Pneumonia, unspecified organism: Secondary | ICD-10-CM

## 2022-08-04 DIAGNOSIS — I10 Essential (primary) hypertension: Secondary | ICD-10-CM | POA: Diagnosis not present

## 2022-08-04 DIAGNOSIS — J9601 Acute respiratory failure with hypoxia: Secondary | ICD-10-CM | POA: Diagnosis not present

## 2022-08-04 DIAGNOSIS — J9602 Acute respiratory failure with hypercapnia: Secondary | ICD-10-CM | POA: Diagnosis not present

## 2022-08-04 LAB — CULTURE, BLOOD (ROUTINE X 2)
Culture: NO GROWTH
Culture: NO GROWTH
Special Requests: ADEQUATE
Special Requests: ADEQUATE

## 2022-08-04 LAB — BASIC METABOLIC PANEL
Anion gap: 9 (ref 5–15)
BUN: 19 mg/dL (ref 8–23)
CO2: 24 mmol/L (ref 22–32)
Calcium: 9.3 mg/dL (ref 8.9–10.3)
Chloride: 108 mmol/L (ref 98–111)
Creatinine, Ser: 0.96 mg/dL (ref 0.44–1.00)
GFR, Estimated: >60 mL/min (ref >60)
Glucose, Bld: 126 mg/dL — ABNORMAL HIGH (ref 70–99)
Potassium: 3.4 mmol/L — ABNORMAL LOW (ref 3.5–5.1)
Sodium: 141 mmol/L (ref 135–145)

## 2022-08-04 MED ORDER — CARVEDILOL 3.125 MG PO TABS: 3.1250 mg | ORAL_TABLET | Freq: Two times a day (BID) | ORAL | 2 refills | Status: DC

## 2022-08-04 MED ORDER — LOSARTAN POTASSIUM 25 MG PO TABS: 25.0000 mg | ORAL_TABLET | Freq: Every day | ORAL | 2 refills | Status: DC

## 2022-08-04 MED ORDER — ASPIRIN 81 MG PO TBEC
81.0000 mg | DELAYED_RELEASE_TABLET | Freq: Every day | ORAL | Status: DC
  Administered 2022-08-04: 81 mg via ORAL
  Filled 2022-08-04: qty 1

## 2022-08-04 MED ORDER — ASPIRIN 81 MG PO TBEC: 81.0000 mg | DELAYED_RELEASE_TABLET | Freq: Every day | ORAL | 12 refills | Status: DC

## 2022-08-04 MED ORDER — PANTOPRAZOLE SODIUM 40 MG PO TBEC: 40.0000 mg | DELAYED_RELEASE_TABLET | Freq: Every day | ORAL | 2 refills | Status: DC

## 2022-08-04 MED ORDER — AMOXICILLIN-POT CLAVULANATE 875-125 MG PO TABS: 1.0000 | ORAL_TABLET | Freq: Two times a day (BID) | ORAL | 0 refills | Status: AC

## 2022-08-04 MED ORDER — FUROSEMIDE 40 MG PO TABS: 40.0000 mg | ORAL_TABLET | Freq: Every day | ORAL | 2 refills | Status: DC

## 2022-08-04 MED ORDER — SPIRONOLACTONE 25 MG PO TABS: 12.5000 mg | ORAL_TABLET | Freq: Every day | ORAL | 2 refills | Status: DC

## 2022-08-04 MED ORDER — EMPAGLIFLOZIN 10 MG PO TABS: 10.0000 mg | ORAL_TABLET | Freq: Every day | ORAL | 2 refills | Status: DC

## 2022-08-04 NOTE — Care Management Important Message (Signed)
Important Message  Patient Details  Name: Summer Christensen MRN: [id number] Date of Birth: 09-25-1951   Medicare Important Message Given:  Yes     Tommy Medal 08/04/2022, 11:40 AM

## 2022-08-04 NOTE — TOC Transition Note (Addendum)
Transition of Care [facility]) - CM/SW Discharge Note   Patient Details  Name: Summer Christensen MRN: [id number] Date of Birth: 1951-09-12  Transition of Care [facility]) CM/SW Contact:  Boneta Lucks, RN Phone Number: 08/04/2022, 11:23 AM   Clinical Narrative:   Patient discharging home today. Referred to [facility] with [facility] for [place]. Patient needs rolling walker, Zach with [employer] with have shipped to the home.   Patient also qualifies for home oxygen. Updated Thedore Mins, waiting on ETA, updated RN.  Final next level of care: [place] Barriers to Discharge: Barriers Resolved   Patient Goals and CMS Choice Patient states their goals for this hospitalization and ongoing recovery are:: go home CMS Medicare.gov Compare Post Acute Care list provided to:: Patient Represenative (must comment) Choice offered to / list presented to : Adult Children  Discharge Placement          Patient and family notified of of transfer: 08/04/22  Discharge Plan and Services In-house Referral: Clinical Social Work   Post Acute Care Choice: Museum/gallery conservator, Home Health          DME Arranged: Walker rolling DME Agency: [employer] Date DME Agency Contacted: 08/04/22 Time DME Agency Contacted: [phone] Representative spoke with at DME Agency: [place]: PT [place]: [place] ([place]) Date [place]: 08/04/22 Time [place]: 1100 Representative spoke with at [place]: [place]  Readmission Risk Interventions    08/04/2022   10:51 AM  Readmission Risk Prevention Plan  Post Dischage Appt Complete  Medication Screening Complete  Transportation Screening Complete

## 2022-08-04 NOTE — Assessment & Plan Note (Signed)
-   Treated adequately with antibiotics; at discharge patient will complete therapy with the use of Augmentin. -Repeat chest x-ray in 6-8 weeks to assure complete resolution of infiltrates.

## 2022-08-04 NOTE — Progress Notes (Signed)
SATURATION QUALIFICATIONS: (This note is used to comply with regulatory documentation for home oxygen)  Patient Saturations on Room Air at Rest = 92%  Patient Saturations on Room Air while Ambulating = 88%  Patient Saturations on 2 Liters of oxygen while Ambulating = 92%  Please briefly explain why patient needs home oxygen: Pt would benefit from at home oxygen as needed, pt does de-sat when ambulating.

## 2022-08-04 NOTE — Plan of Care (Signed)
Pt alert and oriented x 4. UP adlib in room. Vitals stable. Only prn was tylenol given prior to start of shift. Lungs diminished. Oxygen 2l/[place] sats 93%.  Problem: Education: Goal: Knowledge of General Education information will improve Description: Including pain rating scale, medication(s)/side effects and non-pharmacologic comfort measures Outcome: Progressing   Problem: Health Behavior/Discharge Planning: Goal: Ability to manage health-related needs will improve Outcome: Progressing   Problem: Clinical Measurements: Goal: Ability to maintain clinical measurements within normal limits will improve Outcome: Progressing Goal: Will remain free from infection Outcome: Progressing Goal: Diagnostic test results will improve Outcome: Progressing Goal: Respiratory complications will improve Outcome: Progressing Goal: Cardiovascular complication will be avoided Outcome: Progressing   Problem: Activity: Goal: Risk for activity intolerance will decrease Outcome: Progressing   Problem: Nutrition: Goal: Adequate nutrition will be maintained Outcome: Progressing   Problem: Coping: Goal: Level of anxiety will decrease Outcome: Progressing   Problem: Elimination: Goal: Will not experience complications related to bowel motility Outcome: Progressing Goal: Will not experience complications related to urinary retention Outcome: Progressing   Problem: Pain Managment: Goal: General experience of comfort will improve Outcome: Progressing   Problem: Safety: Goal: Ability to remain free from injury will improve Outcome: Progressing   Problem: Skin Integrity: Goal: Risk for impaired skin integrity will decrease Outcome: Progressing

## 2022-08-04 NOTE — Discharge Summary (Signed)
Physician Discharge Summary   Patient: Summer Christensen MRN: [id number] DOB: 10-01-1951  Admit date:     07/29/2022  Discharge date: 08/04/22  Discharge Physician: Barton Dubois   PCP: Vidal Schwalbe, MD   Recommendations at discharge:  Repeat basic metabolic panel to follow electrolytes and renal function Reassess blood pressure and adjust antihypertensive treatment as needed Repeat chest x-ray to 6-8 weeks to assure complete resolution of infiltrates. Make sure patient follow-up with cardiology service as instructed for further management/evaluation of her systolic heart failure. Close monitoring of patient's CBGs with further adjustment to hypoglycemic regimen as required.  Discharge Diagnoses: Active Problems:   Acute respiratory failure with hypoxia (HCC)   Acute clinical systolic heart failure (HCC)   Type 2 diabetes mellitus with hyperlipidemia (HCC)   Essential hypertension   Hypokalemia   Community acquired pneumonia  Hospital Course: 71 year old female with a history of hypertension, hyperlipidemia, left bundle branch block, migraine headache presenting with respiratory distress.     History is obtained from speaking with the patient's son at the bedside and review of the medical record.  Apparently, the patient had been in her usual state of health up until the early morning 07/29/2022.  The patient woke up with shortness of breath.  She was brought to the emergency department by her son.  Upon arrival, the patient was noted to be obtunded in respiratory distress with saturation 48% on room air.  She was placed on nonrebreather with improvement to 64%.  She was subsequently intubated. Son and daughter at the bedside stated that the patient had been in her usual state of health without any complaints up until the time of admission.  She had not had any fevers, chills, headache, chest pain, shortness of breath, coughing, hemoptysis, nausea, vomiting.  In the ED, the patient was  afebrile and hemodynamically stable.  She was intubated and sedated on the ventilator.  WBC 25.0, hemoglobin 14.1, platelets 254,000.  Sodium 141, potassium 3.6, CO2 20, serum creatinine 0.94.  AST 36, ALT 33, alk phosphatase 87, total bilirubin 0.7.  BNP 1157.  Troponin 17>> 128.  CT a chest was negative for pulmonary embolus or dissection.    There was bilateral lower lobe consolidation, left greater than right with early upper lobe involvement.  There was superimposed perihilar and centrilobular pulmonary GGO with septal thickening.  CT of the abdomen and pelvis showed right renal cortical scarring.  There was retained stool in the transverse colon.  Otherwise there is no other acute intra-abdominal abnormalities.    The patient was started on vancomycin and Zosyn.  Patient was liberated from mechanical ventilation on 07/31/22.   Assessment and Plan: Acute respiratory failure with hypoxia (HCC) Severe sepsis present on admission.  Right lower lobe interstitial infiltrate.  CT chest with bilateral consolidation at bases, more left than right. Bilateral ground glass opacities.  -Elevated troponin due to severe sepsis and demand ischemia.  -Currently afebrile, WBCs trending down and patient demonstrating good oxygen saturation.  She also denies shortness of breath sensation. -Continue treatment with oral Augmentin to complete antibiotic therapy. -Patient had diuresis with furosemide, for possible component of acute pulmonary edema.  -Continue daily weight, strict intake and output and continue management by cardiology service for component of acute CHF. -continue to wean off oxygen at discharge; 2L mainly needed on exertion. O2 supplementation arranged.   Acute clinical systolic heart failure (HCC) -Echocardiogram with reduced LV systolic function EF <79%, with global hypokinesis. RV systolic function preserved. No significant  valvular disease.  -Good urine output reported; stable renal  function. -Continue oral Lasix (40 mg daily), continue Farxiga, carvedilol, spironolactone and ARB -Close monitoring of renal function as an outpatient recommended. -Low-sodium diet and adequate hydration discussed with patient. -Continue daily weights and outpatient follow-up with cardiology service. -Per discussion patient will follow-up with cardiology as an outpatient for further evaluation and management of systolic heart failure (including ischemic work-up).  Type 2 diabetes mellitus with hyperlipidemia [facility]) -Patient has been started on Farxiga -Follow CBGs fluctuation. -Modified carbohydrate diet and heart healthy discussed with patient. -Continue with statin therapy.  -Continue to follow patient's A1c level and further adjust treatment as needed for diabetes.  Lobar pneumonia ([place]) -Procalcitonin adequately trending down -MRSA PCR negative; vancomycin discontinued -Continue Zosyn and Zithromax -Continue supportive care and follow culture results. -COVID and influenza PCR negative. -Continue the use of flutter valve and incentive spirometer -Continue to wean oxygen supplementation as tolerated -Patient successfully extubated on 07/31/2022 -Physical therapy evaluation requested.  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 -EKG without acute ischemic changes -2D echo demonstrating ejection fraction around 20% -Cardiology service consultation appreciated; feel patient experiencing nonischemic cardiomyopathy process in the setting of sepsis. -Continue Lasix and metoprolol; Jardiance has been initiated today as well (08/01/2022).. -Will follow further recommendations/GDMT adjustments by cardiology service...   Essential hypertension -Continue low dose losartan and carvedilol. -Continue as well diuresis with furosemide, spironolactone and empagliflozin.  -Heart healthy diet discussed with  patient. -Vital signs stable.  Hypokalemia -Renal function with serum cr at 1,0 with K at 3,1 and serum bicarbonate at 25. -Continue diuresis, with loop diuretic, SGLT 2 and add spironolactone. -Lecture lites have been repleted and within normal limits at discharge. -Continue to follow trend.  Elevated brain natriuretic peptide (BNP) level -BNP 1157 -Vascular congestion and interstitial edema appreciated on chest images at time of admission. -Repeat chest x-ray demonstrated improvement in patient's vascular congestion.  Good urine output reported. 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 -Continue to follow daily weights/strict intake and output -Continue metoprolol and Lasix. -Patient started on Jardiance as well.  Community acquired pneumonia - Treated adequately with antibiotics; at discharge patient will complete therapy with the use of Augmentin. -Repeat chest x-ray in 6-8 weeks to assure complete resolution of infiltrates.  Sepsis due to undetermined organism ([place]) -Secondary to pneumonia -Sepsis present at time of admission with leukocytosis, tachycardia, respiratory distress -Procalcitonin 0.11>>3.19>>1.83; continue to follow trend.  -Follow culture result -Continue treatment with Zosyn and Zithromax  -Patient is still spiking low-grade fever -Continue as needed antipyretics and cooling blankets.   -Continue supportive care. -Fever curve continue trending down.   Deep tissue injury in occipital area -Appears to be associated with braided hair and flat position while intubated. -No active drainage, no signs of superimposed infection. -Continue 18 area with Betadine to prevent infection and allow area to heal on its own; keep area clean and dry.  Consultants: Cardiology, PCCM. Procedures performed: See below for x-ray report. Disposition: Home Diet recommendation: Heart  healthy/low-sodium and modified carbohydrate diet.  DISCHARGE MEDICATION: Allergies as of 08/04/2022   No Known Allergies      Medication List     STOP taking these medications    lisinopril 20 MG tablet Commonly known as: ZESTRIL   naproxen 375 MG tablet Commonly known as: NAPROSYN   ondansetron 4 MG disintegrating tablet Commonly known  as: ZOFRAN-ODT   oxyCODONE-acetaminophen 5-325 MG tablet Commonly known as: PERCOCET/ROXICET   tamsulosin 0.4 MG Caps capsule Commonly known as: Flomax       TAKE these medications    amoxicillin-clavulanate 875-125 MG tablet Commonly known as: AUGMENTIN Take 1 tablet by mouth 2 (two) times daily for 3 days.   aspirin EC 81 MG tablet Take 1 tablet (81 mg total) by mouth daily. Swallow whole. Start taking on: August 05, 2022   atorvastatin 40 MG tablet Commonly known as: LIPITOR Take 40 mg by mouth daily.   carvedilol 3.125 MG tablet Commonly known as: COREG Take 1 tablet (3.125 mg total) by mouth 2 (two) times daily with a meal.   dicyclomine 20 MG tablet Commonly known as: BENTYL Take 20 mg by mouth 4 (four) times daily as needed for spasms.   empagliflozin 10 MG Tabs tablet Commonly known as: JARDIANCE Take 1 tablet (10 mg total) by mouth daily. Start taking on: August 05, 2022   furosemide 40 MG tablet Commonly known as: LASIX Take 1 tablet (40 mg total) by mouth daily. Start taking on: August 05, 2022   losartan 25 MG tablet Commonly known as: COZAAR Take 1 tablet (25 mg total) by mouth daily.   pantoprazole 40 MG tablet Commonly known as: PROTONIX Take 1 tablet (40 mg total) by mouth daily. Start taking on: August 05, 2022   Probiotic 250 MG Caps Take 1 capsule by mouth daily.   spironolactone 25 MG tablet Commonly known as: ALDACTONE Take 0.5 tablets (12.5 mg total) by mouth daily. Start taking on: August 05, 2022               Durable Medical Equipment  (From admission, onward)            Start     Ordered   08/02/22 1547  For home use only DME Walker rolling  Once       Question Answer Comment  Walker: With [place] Wheels   Patient needs a walker to treat with the following condition Ambulatory dysfunction      08/02/22 1546   08/02/22 1255  For home use only DME Walker rolling  Once       Comments: Patient tends to stagger left/right ambulating without AD with near loss of balance, safer using RW without loss of balance  Question Answer Comment  Walker: With [place] Wheels   Patient needs a walker to treat with the following condition Gait difficulty      08/02/22 1255              Discharge Care Instructions  (From admission, onward)           Start     Ordered   08/04/22 0000  Discharge wound care:       Comments: Constant repositioning; pain with Betadine swab stick (probably done-iodine) to encourage drying of the area and prevent infection.  No dressing needed.  Keep area clean and dry.   08/04/22 4193            Follow-up Information     Vidal Schwalbe, MD. Schedule an appointment as soon as possible for a visit in 10 day(s).   Specialty: Family Medicine Contact information: [address] [place] [address] [phone]                Discharge Exam: Filed Weights   08/01/22 0500 08/02/22 0621 08/03/22 0444  Weight: 70 kg 65.2 kg  65.4 kg   General exam: Alert, awake, oriented x 3; no chest pain, no nausea, no vomiting, no palpitations.  Reports breathing is back to baseline and feeling ready to go home.   Respiratory system: No frank crackles, no wheezing, positive rhonchi appreciated bilaterally; no using accessory muscle. 2L needed to keep saturation above 90% on exertion mainly. Cardiovascular system:RRR. No rubs or gallops; no JVD. Gastrointestinal system: Abdomen is nondistended, soft and nontender. No organomegaly or masses felt. Normal bowel sounds heard. Central nervous system: Alert and oriented. No  focal neurological deficits. Extremities: No cyanosis, clubbing or edema. Skin: No petechiae.  Deep tissue injury appreciated in occipital area; no fluctuation, no open drainage. Psychiatry: Judgement and insight appear normal. Mood & affect appropriate.   Condition at discharge: Stable and improved.  The results of significant diagnostics from this hospitalization (including imaging, microbiology, ancillary and laboratory) are listed below for reference.   Imaging Studies: DG Chest Port 1 View  Result Date: 08/01/2022 CLINICAL DATA:  Fever EXAM: PORTABLE CHEST 1 VIEW COMPARISON:  Chest radiograph 07/31/2022 FINDINGS: No pleural effusion. No pneumothorax. Normal lung volumes. Unchanged cardiac and mediastinal contours. Compared to prior exam there is slight interval increase in conspicuity of a retrocardiac opacity, which could represent atelectasis, but infection is not excluded. No displaced rib fractures. The visualized upper abdomen is unremarkable. IMPRESSION: Compared to prior exam there is slight interval increase in conspicuity of a retrocardiac opacity, which could represent atelectasis, but infection is not excluded. Electronically Signed   By: Marin Roberts M.D.   On: 08/01/2022 09:49   DG Chest Port 1 View  Result Date: 07/31/2022 CLINICAL DATA:  Bibasilar crackles. EXAM: PORTABLE CHEST 1 VIEW COMPARISON:  07/31/2022 FINDINGS: Interval removal of enteric and endotracheal tubes. Mild cardiac enlargement. No vascular congestion or edema. Lung bases and costophrenic angles are clear. No pneumothorax. IMPRESSION: Cardiac enlargement.  Lungs appear clear postextubation. Electronically Signed   By: Lucienne Capers M.D.   On: 07/31/2022 23:34   DG Chest Port 1 View  Result Date: 07/31/2022 CLINICAL DATA:  Acute respiratory failure EXAM: PORTABLE CHEST 1 VIEW COMPARISON:  Chest 07/30/2022 FINDINGS: Endotracheal tube 2.5 cm above the carina. NG tube in the stomach with the tip not  visualized. Left lower lobe consolidation with mild progression. Mild right lower lobe airspace disease with progression. IMPRESSION: 1. Endotracheal tube in good position. 2. Progression of bibasilar airspace disease left greater than right. Electronically Signed   By: Franchot Gallo M.D.   On: 07/31/2022 14:01   DG Chest Port 1 View  Result Date: 07/30/2022 CLINICAL DATA:  Acute on chronic respiratory failure with hypoxemia. EXAM: PORTABLE CHEST 1 VIEW COMPARISON:  07/29/2022 FINDINGS: ET tube tip is above the carina. Enteric tube tip courses below the level of the hemidiaphragms. Unchanged asymmetric elevation of the right hemidiaphragm. Persistent airspace disease within the left lower lobe which appears unchanged from the previous exam. Decreased interstitial edema pattern. IMPRESSION: 1. Stable support apparatus. 2. Persistent left lower lobe airspace disease. 3. Decreased interstitial edema pattern. Electronically Signed   By: Kerby Moors M.D.   On: 07/30/2022 08:54   ECHOCARDIOGRAM COMPLETE  Result Date: 07/29/2022    ECHOCARDIOGRAM REPORT   Patient Name:   Summer Christensen Date of Exam: 07/29/2022 Medical Rec #:  [id number]     Height:       64.0 in Accession #:    [id number]    Weight:       145.7 lb Date of  Birth:  02-04-1951     BSA:          1.710 m Patient Age:    8 years      BP:           102/69 mmHg Patient Gender: F             HR:           95 bpm. Exam Location:  [facility] Procedure: 2D Echo, Color Doppler, Cardiac Doppler and Intracardiac            Opacification Agent Indications:    X45.03 Acute systolic (congestive) heart failure  History:        Patient has prior history of Echocardiogram examinations, most                 recent 01/08/2011. Arrythmias:LBBB; Risk Factors:Hypertension and                 Diabetes.  Sonographer:    Raquel Sarna Senior RDCS Referring Phys: [id number] OLADAPO ADEFESO  Sonographer Comments: Scanned supine on artificial respirator. IMPRESSIONS  1. Left ventricular  ejection fraction, by estimation, is <20%. The left ventricle has severely decreased function. The left ventricle demonstrates global hypokinesis. The left ventricular internal cavity size was moderately dilated. Left ventricular diastolic parameters are consistent with Grade III diastolic dysfunction (restrictive).  2. Right ventricular systolic function is normal. The right ventricular size is normal.  3. The mitral valve is normal in structure. Mild to moderate mitral valve regurgitation. No evidence of mitral stenosis.  4. The aortic valve is tricuspid. Aortic valve regurgitation is not visualized. No aortic stenosis is present.  5. The inferior vena cava is normal in size with greater than 50% respiratory variability, suggesting right atrial pressure of 3 mmHg. FINDINGS  Left Ventricle: Left ventricular ejection fraction, by estimation, is <20%. The left ventricle has severely decreased function. The left ventricle demonstrates global hypokinesis. Definity contrast agent was given IV to delineate the left ventricular endocardial borders. The left ventricular internal cavity size was moderately dilated. There is no left ventricular hypertrophy. Abnormal (paradoxical) septal motion, consistent with left bundle branch block. Left ventricular diastolic parameters are consistent with Grade III diastolic dysfunction (restrictive). Right Ventricle: The right ventricular size is normal. No increase in right ventricular wall thickness. Right ventricular systolic function is normal. Left Atrium: Left atrial size was normal in size. Right Atrium: Right atrial size was normal in size. Pericardium: There is no evidence of pericardial effusion. Mitral Valve: The mitral valve is normal in structure. Mild to moderate mitral valve regurgitation. No evidence of mitral valve stenosis. Tricuspid Valve: The tricuspid valve is normal in structure. Tricuspid valve regurgitation is not demonstrated. No evidence of tricuspid stenosis.  Aortic Valve: The aortic valve is tricuspid. Aortic valve regurgitation is not visualized. No aortic stenosis is present. Pulmonic Valve: The pulmonic valve was normal in structure. Pulmonic valve regurgitation is mild. No evidence of pulmonic stenosis. Aorta: The aortic root is normal in size and structure. Venous: The inferior vena cava is normal in size with greater than 50% respiratory variability, suggesting right atrial pressure of 3 mmHg. IAS/Shunts: No atrial level shunt detected by color flow Doppler.  LEFT VENTRICLE PLAX 2D LVIDd:         5.90 cm      Diastology LVIDs:         4.50 cm      LV e' medial:    4.90 cm/s LV PW:  1.00 cm      LV E/e' medial:  20.8 LV IVS:        0.90 cm      LV e' lateral:   7.94 cm/s LVOT diam:     1.90 cm      LV E/e' lateral: 12.8 LV SV:         30 LV SV Index:   18 LVOT Area:     2.84 cm  LV Volumes (MOD) LV vol d, MOD A2C: 131.0 ml LV vol d, MOD A4C: 114.0 ml LV vol s, MOD A2C: 114.0 ml LV vol s, MOD A4C: 102.0 ml LV SV MOD A2C:     17.0 ml LV SV MOD A4C:     114.0 ml LV SV MOD BP:      14.8 ml RIGHT VENTRICLE RV S prime:     9.03 cm/s TAPSE (M-mode): 1.9 cm LEFT ATRIUM             Index        RIGHT ATRIUM           Index LA diam:        4.00 cm 2.34 cm/m   RA Area:     12.70 cm LA Vol (A2C):   56.3 ml 32.92 ml/m  RA Volume:   26.30 ml  15.38 ml/m LA Vol (A4C):   39.7 ml 23.22 ml/m LA Biplane Vol: 48.8 ml 28.54 ml/m  AORTIC VALVE LVOT Vmax:   92.70 cm/s LVOT Vmean:  62.300 cm/s LVOT VTI:    0.106 m  AORTA Ao Root diam: 3.00 cm Ao Asc diam:  3.30 cm MITRAL VALVE MV Area (PHT): 5.50 cm     SHUNTS MV Decel Time: 138 msec     Systemic VTI:  0.11 m MV E velocity: 102.00 cm/s  Systemic Diam: 1.90 cm MV A velocity: 30.00 cm/s MV E/A ratio:  3.40 Candee Furbish MD Electronically signed by Candee Furbish MD Signature Date/Time: 07/29/2022/10:33:16 AM    Final    DG Chest Portable 1 View  Result Date: 07/29/2022 CLINICAL DATA:  NG tube placement. EXAM: PORTABLE CHEST 1  VIEW COMPARISON:  07/29/2022 FINDINGS: 0451 hours. Endotracheal tube tip is 1.3 cm above the base of the carina. The NG tube passes into the stomach although the distal tip position is not included on the film. The cardio pericardial silhouette is enlarged. Vascular congestion noted with underlying chronic interstitial coarsening. Mild bibasilar atelectasis noted without substantial pleural effusion. Telemetry leads overlie the chest. IMPRESSION: Endotracheal tube tip is 1.3 cm above the base of the carina. NG tube passes into the stomach. Cardiomegaly with vascular congestion and basilar atelectasis. Electronically Signed   By: Misty Stanley M.D.   On: 07/29/2022 05:30   CT Angio Chest/Abd/Pel for Dissection W and/or Wo Contrast  Addendum Date: 07/29/2022   ADDENDUM REPORT: 07/29/2022 05:09 ADDENDUM: ETT position and other salient findings discussed by telephone with Dr. Veryl Speak on 07/29/2022 at 0501 hours. Electronically Signed   By: Genevie Ann M.D.   On: 07/29/2022 05:09   Result Date: 07/29/2022 CLINICAL DATA:  71 year old female unresponsive at home. Altered mental status. Intubated. EXAM: CT ANGIOGRAPHY CHEST, ABDOMEN AND PELVIS TECHNIQUE: Non-contrast CT of the chest was initially obtained. Multidetector CT imaging through the chest, abdomen and pelvis was performed using the standard protocol during bolus administration of intravenous contrast. Multiplanar reconstructed images and MIPs were obtained and reviewed to evaluate the vascular anatomy. RADIATION DOSE REDUCTION: This exam was  performed according to the departmental dose-optimization program which includes automated exposure control, adjustment of the mA and/or kV according to patient size and/or use of iterative reconstruction technique. CONTRAST:  144m OMNIPAQUE IOHEXOL 350 MG/ML SOLN COMPARISON:  Portable chest 0347 hours today. CTA chest 01/06/2011. CT Abdomen and Pelvis 12/25/2021. FINDINGS: CTA CHEST FINDINGS Cardiovascular:  Calcified aortic atherosclerosis. Calcified coronary artery atherosclerosis. Mild cardiomegaly appears stable since March. No pericardial effusion. Following contrast there is no thoracic aorta dissection. Incidental cardiac pulsation artifact. No thoracic aortic aneurysm. Also, the central pulmonary arteries are enhancing and appear to be patent. Mediastinum/Nodes: Negative. Lungs/Pleura: Intubated, endotracheal tube tip terminates just at the right mainstem bronchus on series 10, image 72. Atelectatic changes to the airways at the carina. Dependent, confluent pulmonary consolidation in both lower lobes, greater on the left. Early dependent upper lobe involvement. Small pulmonary arterial branches appear to be enhancing in the areas of consolidation. Superimposed perihilar and centrilobular pulmonary ground-glass opacity in both lungs with some septal thickening. Trace layering pleural effusions. Musculoskeletal: Respiratory motion at the sternum and ribs. No acute osseous abnormality identified. Review of the MIP images confirms the above findings. CTA ABDOMEN AND PELVIS FINDINGS VASCULAR Mild Aortoiliac calcified atherosclerosis. Negative for abdominal aortic aneurysm or dissection. Major arterial structures are patent. Portal venous system also appears to be patent. Review of the MIP images confirms the above findings. NON-VASCULAR Hepatobiliary: Chronically absent gallbladder. Liver appears stable and negative when allowing for motion artifact. Pancreas: Stable, negative. Spleen: Within normal limits. Adrenals/Urinary Tract: Stable, negative adrenal glands. Asymmetric right renal cortical scarring and volume loss. Symmetric renal enhancement. Motion artifact. No convincing hydronephrosis or pararenal inflammation. Ureters appear decompressed. No obvious urinary calculus. Unremarkable bladder. Stomach/Bowel: Intermittent motion artifact. Redundant but decompressed large bowel except for retained stool in the  transverse colon. Normal retrocecal appendix on series 6, image 152. No dilated small bowel. Small volume of air in fluid in the stomach. Negative duodenum. No free air, free fluid, or mesenteric inflammation identified. Lymphatic: No lymphadenopathy. Reproductive: Chronically absent uterus, diminutive or absent ovaries. Other: No pelvic free fluid. Musculoskeletal: No acute osseous abnormality identified. Review of the MIP images confirms the above findings. IMPRESSION: 1. Intubated, endotracheal tube tip terminates just inside the right mainstem bronchus. Recommend retracting 1 cm. 2. Negative for aortic dissection or aneurysm. Aortic Atherosclerosis (ICD10-I70.0). 3. Positive for dependent consolidation in both lungs, especially lower lobes greater on the left. This is most compatible with Pneumonia Versus Aspiration. 4. Trace layering pleural effusions and additional central pulmonary ground-glass opacity with septal thickening raising the possibility of mild or developing interstitial edema. Stable cardiomegaly. No pericardial effusion. 5. No acute or inflammatory process identified in the abdomen or pelvis. Chronic right renal cortical scarring. Electronically Signed: By: HGenevie AnnM.D. On: 07/29/2022 04:57   CT Head Wo Contrast  Result Date: 07/29/2022 CLINICAL DATA:  71year old female unresponsive at home. Altered mental status. Intubated. EXAM: CT HEAD WITHOUT CONTRAST TECHNIQUE: Contiguous axial images were obtained from the base of the skull through the vertex without intravenous contrast. RADIATION DOSE REDUCTION: This exam was performed according to the departmental dose-optimization program which includes automated exposure control, adjustment of the mA and/or kV according to patient size and/or use of iterative reconstruction technique. COMPARISON:  Head CT 08/03/2009. FINDINGS: Brain: Cerebral volume is within normal limits for age. No midline shift, ventriculomegaly, mass effect, evidence of  mass lesion, intracranial hemorrhage or evidence of cortically based acute infarction. Gray-white matter differentiation is within normal limits  for age. No convincing encephalomalacia. Vascular: Calcified atherosclerosis at the skull base. No suspicious intracranial vascular hyperdensity. Skull: Stable.  No acute osseous abnormality identified. Sinuses/Orbits: Visualized paranasal sinuses and mastoids are stable and well aerated. Other: Visualized orbits and scalp soft tissues are within normal limits. IMPRESSION: Normal for age non contrast CT appearance of the brain, head. Electronically Signed   By: Genevie Ann M.D.   On: 07/29/2022 04:48   DG Chest Portable 1 View  Result Date: 07/29/2022 CLINICAL DATA:  71 year old female with shortness of breath. EXAM: PORTABLE CHEST 1 VIEW COMPARISON:  Chest radiographs 12/31/2011 and earlier. CT Abdomen and Pelvis 12/25/2021. FINDINGS: Portable AP semi upright view at 0347 hours. Cardiomegaly, new since 2013 but appears stable from the CT Abdomen and Pelvis this year. Other mediastinal contours are within normal limits. Visualized tracheal air column is within normal limits. Lung volumes are within normal limits. No pneumothorax, pleural effusion or consolidation. Mild streaky bilateral perihilar opacity. Increased pulmonary vascularity from prior radiographs, but no overt edema. No acute osseous abnormality identified. Paucity of bowel gas in the upper abdomen. IMPRESSION: 1. Cardiomegaly with vascular congestion but no overt edema. 2. Streaky bilateral perihilar opacity, nonspecific but favor atelectasis. Electronically Signed   By: Genevie Ann M.D.   On: 07/29/2022 04:00    Microbiology: Results for orders placed or performed during the hospital encounter of 07/29/22  Blood culture (routine x 2)     Status: None   Collection Time: 07/29/22  6:17 AM   Specimen: BLOOD RIGHT ARM  Result Value Ref Range Status   Specimen Description BLOOD RIGHT ARM  Final   Special  Requests   Final    BOTTLES DRAWN AEROBIC AND ANAEROBIC Blood Culture adequate volume   Culture   Final    NO GROWTH 6 DAYS Performed at [facility], [address], [place], [place] [address]    Report Status 08/04/2022 FINAL  Final  Blood culture (routine x 2)     Status: None   Collection Time: 07/29/22  6:23 AM   Specimen: BLOOD LEFT ARM  Result Value Ref Range Status   Specimen Description BLOOD LEFT ARM  Final   Special Requests   Final    BOTTLES DRAWN AEROBIC AND ANAEROBIC Blood Culture adequate volume   Culture   Final    NO GROWTH 6 DAYS Performed at [facility], [address], [place], [place] [address]    Report Status 08/04/2022 FINAL  Final  MRSA Next Gen by PCR, Nasal     Status: None   Collection Time: 07/29/22  7:29 AM   Specimen: Anterior Nasal Swab  Result Value Ref Range Status   MRSA by PCR Next Gen NOT DETECTED NOT DETECTED Final    Comment: (NOTE) The GeneXpert MRSA Assay (FDA approved for NASAL specimens only), is one component of a comprehensive MRSA colonization surveillance program. It is not intended to diagnose MRSA infection nor to guide or monitor treatment for MRSA infections. Test performance is not FDA approved in patients less than 75 years old. Performed at [facility], [address], [place], [place] [address]   SARS Coronavirus 2 by RT PCR (hospital order, performed in [facility] hospital lab) *cepheid single result test* Anterior Nasal Swab     Status: None   Collection Time: 07/29/22  7:29 AM   Specimen: Anterior Nasal Swab  Result Value Ref Range Status   SARS Coronavirus 2 by RT PCR NEGATIVE NEGATIVE Final    Comment: (  NOTE) SARS-CoV-2 target nucleic acids are NOT DETECTED.  The SARS-CoV-2 RNA is generally detectable in upper and lower respiratory specimens during the acute phase of infection. The lowest concentration of SARS-CoV-2 viral copies this assay can detect is 250 copies / mL. A negative result does not preclude  SARS-CoV-2 infection and should not be used as the sole basis for treatment or other patient management decisions.  A negative result may occur with improper specimen collection / handling, submission of specimen other than nasopharyngeal swab, presence of viral mutation(s) within the areas targeted by this assay, and inadequate number of viral copies (<250 copies / mL). A negative result must be combined with clinical observations, patient history, and epidemiological information.  Fact Sheet for Patients:   https://www.patel.info/  Fact Sheet for Healthcare Providers: https://hall.com/  This test is not yet approved or  cleared by the [place] FDA and has been authorized for detection and/or diagnosis of SARS-CoV-2 by FDA under an Emergency Use Authorization (EUA).  This EUA will remain in effect (meaning this test can be used) for the duration of the COVID-19 declaration under Section 564(b)(1) of the Act, 21 U.S.C. section 360bbb-3(b)(1), unless the authorization is terminated or revoked sooner.  Performed at [facility], [address], [place], [place] [address]   Urine Culture     Status: None   Collection Time: 07/29/22  7:30 AM   Specimen: Urine, Catheterized  Result Value Ref Range Status   Specimen Description   Final    URINE, CATHETERIZED Performed at [facility], [address] [place], [place] [address]    Special Requests   Final    NONE Performed at [facility], [address] [place], [place] [address]    Culture   Final    NO GROWTH Performed at [facility], [place] [address], [place], [place] [address]    Report Status 07/31/2022 FINAL  Final  Culture, Respiratory w Gram Stain     Status: None   Collection Time: 07/30/22  3:00 AM   Specimen: Tracheal Aspirate; Respiratory  Result Value Ref Range Status   Specimen Description   Final    TRACHEAL ASPIRATE Performed at [facility], [address] [place], [place] [address]    Special Requests   Final    NONE Performed at [facility], [address], [place], [place] [address]    Gram Stain   Final    MODERATE WBC PRESENT, PREDOMINANTLY PMN RARE GRAM POSITIVE COCCI IN PAIRS    Culture   Final    Normal respiratory flora-no Staph aureus or Pseudomonas seen Performed at [facility] [place] [address], [place], [place] [address]    Report Status 08/01/2022 FINAL  Final    Labs: CBC: Recent Labs  Lab 07/29/22 0345 07/29/22 0351 07/30/22 0434 08/02/22 0352 08/03/22 0409  WBC 25.0*  --  14.6* 15.4* 13.6*  NEUTROABS 14.3*  --   --   --   --   HGB 14.1 14.3 11.9* 12.6 12.1  HCT 43.7 42.0 36.4 38.8 36.3  MCV 89.0  --  87.9 86.6 85.6  PLT 254  --  175 164 353   Basic Metabolic Panel: Recent Labs  Lab 07/29/22 1327 07/30/22 0434 07/31/22 0443 07/31/22 2129 08/01/22 0404 08/02/22 0352 08/03/22 0409 08/04/22 0542  NA  --  142 141 140 137 142 141 141  K  --  3.2* 4.2 2.8* 3.7 3.8 3.1* 3.4*  CL  --  110  109 105 105 108 105 108  CO2  --  24 21* 25 25 25 26 24   GLUCOSE  --  145* 133* 151* 156* 127* 128* 126*  BUN  --  12 11 11 13 18 19 19   CREATININE  --  1.10* 1.14* 1.06* 1.00 1.05* 1.01* 0.96  CALCIUM  --  8.9 8.8* 8.9 8.6* 9.2 9.3 9.3  MG 2.0 1.9 2.1 2.1  --   --   --   --   PHOS 3.7  --   --   --   --   --   --   --    Liver Function Tests: Recent Labs  Lab 07/29/22 0345 07/30/22 0434  AST 36 52*  ALT 33 51*  ALKPHOS 87 71  BILITOT 0.7 0.9  PROT 7.7 6.0*  ALBUMIN 4.1 3.3*   CBG: Recent Labs  Lab 07/31/22 1931 08/01/22 0100 08/01/22 0458 08/01/22 0729 08/01/22 1157  GLUCAP 140* 148* 155* 123* 133*    Discharge time spent: greater than 30 minutes.  Signed: Barton Dubois, MD [facility] 08/04/2022

## 2022-08-04 NOTE — Progress Notes (Signed)
Cardiology Progress Note  Patient ID: Summer Christensen MRN: [id number] DOB: [birth year] Date of Encounter: 08/04/2022  Primary Cardiologist: None  Subjective   Chief Complaint: None.   HPI: Ready to go home. Denies symptoms.   ROS:  All other ROS reviewed and negative. Pertinent positives noted in the HPI.     Inpatient Medications  Scheduled Meds:  atorvastatin  40 mg Per Tube Daily   carvedilol  3.125 mg Oral BID WC   empagliflozin  10 mg Oral Daily   enoxaparin (LOVENOX) injection  40 mg Subcutaneous Q24H   furosemide  40 mg Oral Daily   losartan  25 mg Oral Daily   pantoprazole  40 mg Oral Daily   spironolactone  12.5 mg Oral Daily   Continuous Infusions:  PRN Meds: acetaminophen, ondansetron (ZOFRAN) IV, mouth rinse, mouth rinse   Vital Signs   Vitals:   08/03/22 1345 08/03/22 1700 08/03/22 2059 08/04/22 0516  BP: 112/82 125/71 116/76 (!) 122/58  Pulse: 94 81 91 87  Resp: 18  18 (!) 21  Temp: 98.5 F (36.9 C)  98.6 F (37 C) 99.5 F (37.5 C)  TempSrc: Oral  Oral Oral  SpO2: 95%  93% 93%  Weight:      Height:        Intake/Output Summary (Last 24 hours) at 08/04/2022 0830 Last data filed at 08/03/2022 1843 Gross per 24 hour  Intake 480 ml  Output --  Net 480 ml      08/03/2022    4:44 AM 08/02/2022    6:21 AM 08/01/2022    5:00 AM  Last 3 Weights  Weight (lbs) 144 lb 2.9 oz 143 lb 11.8 oz 154 lb 5.2 oz  Weight (kg) 65.4 kg 65.2 kg 70 kg      Telemetry  Overnight telemetry shows SR with PVCs, which I personally reviewed.   Physical Exam   Vitals:   08/03/22 1345 08/03/22 1700 08/03/22 2059 08/04/22 0516  BP: 112/82 125/71 116/76 (!) 122/58  Pulse: 94 81 91 87  Resp: 18  18 (!) 21  Temp: 98.5 F (36.9 C)  98.6 F (37 C) 99.5 F (37.5 C)  TempSrc: Oral  Oral Oral  SpO2: 95%  93% 93%  Weight:      Height:        Intake/Output Summary (Last 24 hours) at 08/04/2022 0830 Last data filed at 08/03/2022 1843 Gross per 24 hour  Intake 480  ml  Output --  Net 480 ml       08/03/2022    4:44 AM 08/02/2022    6:21 AM 08/01/2022    5:00 AM  Last 3 Weights  Weight (lbs) 144 lb 2.9 oz 143 lb 11.8 oz 154 lb 5.2 oz  Weight (kg) 65.4 kg 65.2 kg 70 kg    Body mass index is 24.75 kg/m.  General: Well nourished, well developed, in no acute distress Head: Atraumatic, normal size  Eyes: PEERLA, EOMI  Neck: Supple, no JVD Endocrine: No thryomegaly Cardiac: Normal S1, S2; RRR; no murmurs, rubs, or gallops Lungs: crackles at lung bases  Abd: Soft, nontender, no hepatomegaly  Ext: No edema, pulses 2+ Musculoskeletal: No deformities, BUE and BLE strength normal and equal Skin: Warm and dry, no rashes   Neuro: Alert and oriented to person, place, time, and situation, CNII-XII grossly intact, no focal deficits  Psych: Normal mood and affect   Labs  High Sensitivity Troponin:   Recent Labs  Lab 07/29/22 0345 07/29/22 7782  07/29/22 0623 07/29/22 1054  TROPONINIHS 17 122* 128* 263*     Cardiac EnzymesNo results for input(s): "TROPONINI" in the last 168 hours. No results for input(s): "TROPIPOC" in the last 168 hours.  Chemistry Recent Labs  Lab 07/29/22 0345 07/29/22 0351 07/30/22 0434 07/31/22 0443 08/02/22 0352 08/03/22 0409 08/04/22 0542  NA 141   < > 142   < > 142 141 141  K 3.6   < > 3.2*   < > 3.8 3.1* 3.4*  CL 109   < > 110   < > 108 105 108  CO2 20*  --  24   < > 25 26 24   GLUCOSE 253*   < > 145*   < > 127* 128* 126*  BUN 16   < > 12   < > 18 19 19   CREATININE 0.94   < > 1.10*   < > 1.05* 1.01* 0.96  CALCIUM 9.4  --  8.9   < > 9.2 9.3 9.3  PROT 7.7  --  6.0*  --   --   --   --   ALBUMIN 4.1  --  3.3*  --   --   --   --   AST 36  --  52*  --   --   --   --   ALT 33  --  51*  --   --   --   --   ALKPHOS 87  --  71  --   --   --   --   BILITOT 0.7  --  0.9  --   --   --   --   GFRNONAA >60  --  54*   < > 57* 60* >60  ANIONGAP 12  --  8   < > 9 10 9    < > = values in this interval not displayed.     Hematology Recent Labs  Lab 07/30/22 0434 08/02/22 0352 08/03/22 0409  WBC 14.6* 15.4* 13.6*  RBC 4.14 4.48 4.24  HGB 11.9* 12.6 12.1  HCT 36.4 38.8 36.3  MCV 87.9 86.6 85.6  MCH 28.7 28.1 28.5  MCHC 32.7 32.5 33.3  RDW 13.3 12.9 12.6  PLT 175 164 199   BNP Recent Labs  Lab 07/29/22 0345  BNP 1,157.0*    DDimer No results for input(s): "DDIMER" in the last 168 hours.   Radiology  No results found.  Cardiac Studies  TTE 07/29/2022  1. Left ventricular ejection fraction, by estimation, is <20%. The left  ventricle has severely decreased function. The left ventricle demonstrates  global hypokinesis. The left ventricular internal cavity size was  moderately dilated. Left ventricular  diastolic parameters are consistent with Grade III diastolic dysfunction  (restrictive).   2. Right ventricular systolic function is normal. The right ventricular  size is normal.   3. The mitral valve is normal in structure. Mild to moderate mitral valve  regurgitation. No evidence of mitral stenosis.   4. The aortic valve is tricuspid. Aortic valve regurgitation is not  visualized. No aortic stenosis is present.   5. The inferior vena cava is normal in size with greater than 50%  respiratory variability, suggesting right atrial pressure of 3 mmHg.   Patient Profile  Summer Christensen is a 71 y.o. female with hypertension, hyperlipidemia, left bundle branch block who was admitted on 07/29/2022 for acute hypoxic respiratory failure secondary to pneumonia.  Found to have congestive heart failure with ejection fraction  was 20%.  Assessment & Plan   #New onset systolic heart failure, EF less than 20% -Thought to be related to critical illness.  Plan has been for medical therapy with follow-up as an outpatient. -Currently euvolemic.  Current medications include carvedilol 3.125 mg twice daily, losartan 25 mg daily, Aldactone 12 mg daily.  On Jardiance 10 mg daily.  On Lasix 40 mg daily. -She is  stable for discharge.  Plan for outpatient ischemia evaluation.  She is without symptoms of chest pain or trouble breathing. -Of note she has a brother while a bundle branch block with QRS 156 ms.  I suspect she will ultimately needing some sort of pacing support given her dyssynchrony.  She will need a trial of medical therapy first.  #NSTEMI 2/2 critical illness and systolic heart failure -Minimal elevation in troponin.  Suspect this is all secondary to her new onset systolic heart failure as well as being critically ill.  Would recommend to continue statin therapy.  We will add aspirin 81 mg daily.  [place] will sign off.   Medication Recommendations: Continue current medications as above. Other recommendations (labs, testing, etc): None. Follow up as an outpatient: We will arrange outpatient follow-up in our office in 2 to 3 weeks  For questions or updates, please contact [place] Please consult [email or web address] for contact info under   Signed, [place] T. Audie Box, MD, [place]  08/04/2022 8:30 AM

## 2022-08-10 ENCOUNTER — Other Ambulatory Visit: Payer: Self-pay

## 2022-08-10 ENCOUNTER — Emergency Department (HOSPITAL_COMMUNITY): Payer: Medicare Other

## 2022-08-10 ENCOUNTER — Encounter (HOSPITAL_COMMUNITY): Payer: Self-pay

## 2022-08-10 ENCOUNTER — Inpatient Hospital Stay (HOSPITAL_COMMUNITY)
Admission: EM | Admit: 2022-08-10 | Discharge: 2022-08-15 | DRG: 175 | Disposition: A | Discharge status: Home Health | Payer: Medicare Other | Attending: Family Medicine | Admitting: Family Medicine

## 2022-08-10 DIAGNOSIS — J9611 Chronic respiratory failure with hypoxia: Secondary | ICD-10-CM | POA: Diagnosis not present

## 2022-08-10 DIAGNOSIS — J9601 Acute respiratory failure with hypoxia: Secondary | ICD-10-CM | POA: Diagnosis not present

## 2022-08-10 DIAGNOSIS — Z8249 Family history of ischemic heart disease and other diseases of the circulatory system: Secondary | ICD-10-CM

## 2022-08-10 DIAGNOSIS — I10 Essential (primary) hypertension: Secondary | ICD-10-CM | POA: Diagnosis not present

## 2022-08-10 DIAGNOSIS — I5022 Chronic systolic (congestive) heart failure: Secondary | ICD-10-CM

## 2022-08-10 DIAGNOSIS — J9811 Atelectasis: Secondary | ICD-10-CM | POA: Diagnosis present

## 2022-08-10 DIAGNOSIS — Z9049 Acquired absence of other specified parts of digestive tract: Secondary | ICD-10-CM

## 2022-08-10 DIAGNOSIS — I82493 Acute embolism and thrombosis of other specified deep vein of lower extremity, bilateral: Secondary | ICD-10-CM | POA: Diagnosis present

## 2022-08-10 DIAGNOSIS — I5043 Acute on chronic combined systolic (congestive) and diastolic (congestive) heart failure: Secondary | ICD-10-CM

## 2022-08-10 DIAGNOSIS — J189 Pneumonia, unspecified organism: Principal | ICD-10-CM

## 2022-08-10 DIAGNOSIS — I700 Atherosclerosis of aorta: Secondary | ICD-10-CM | POA: Diagnosis present

## 2022-08-10 DIAGNOSIS — Z7982 Long term (current) use of aspirin: Secondary | ICD-10-CM

## 2022-08-10 DIAGNOSIS — E785 Hyperlipidemia, unspecified: Secondary | ICD-10-CM

## 2022-08-10 DIAGNOSIS — I2699 Other pulmonary embolism without acute cor pulmonale: Secondary | ICD-10-CM | POA: Diagnosis present

## 2022-08-10 DIAGNOSIS — I2694 Multiple subsegmental pulmonary emboli without acute cor pulmonale: Principal | ICD-10-CM | POA: Diagnosis present

## 2022-08-10 DIAGNOSIS — E782 Mixed hyperlipidemia: Secondary | ICD-10-CM | POA: Diagnosis present

## 2022-08-10 DIAGNOSIS — J850 Gangrene and necrosis of lung: Secondary | ICD-10-CM

## 2022-08-10 DIAGNOSIS — I2693 Single subsegmental pulmonary embolism without acute cor pulmonale: Secondary | ICD-10-CM | POA: Diagnosis not present

## 2022-08-10 DIAGNOSIS — I110 Hypertensive heart disease with heart failure: Secondary | ICD-10-CM | POA: Diagnosis present

## 2022-08-10 DIAGNOSIS — I824Z1 Acute embolism and thrombosis of unspecified deep veins of right distal lower extremity: Secondary | ICD-10-CM | POA: Diagnosis present

## 2022-08-10 DIAGNOSIS — Z9981 Dependence on supplemental oxygen: Secondary | ICD-10-CM | POA: Diagnosis not present

## 2022-08-10 DIAGNOSIS — K219 Gastro-esophageal reflux disease without esophagitis: Secondary | ICD-10-CM | POA: Diagnosis present

## 2022-08-10 DIAGNOSIS — R7989 Other specified abnormal findings of blood chemistry: Secondary | ICD-10-CM | POA: Diagnosis not present

## 2022-08-10 DIAGNOSIS — Z79899 Other long term (current) drug therapy: Secondary | ICD-10-CM | POA: Diagnosis not present

## 2022-08-10 DIAGNOSIS — G894 Chronic pain syndrome: Secondary | ICD-10-CM | POA: Diagnosis present

## 2022-08-10 DIAGNOSIS — J181 Lobar pneumonia, unspecified organism: Secondary | ICD-10-CM | POA: Diagnosis present

## 2022-08-10 DIAGNOSIS — F329 Major depressive disorder, single episode, unspecified: Secondary | ICD-10-CM | POA: Diagnosis present

## 2022-08-10 DIAGNOSIS — J90 Pleural effusion, not elsewhere classified: Secondary | ICD-10-CM | POA: Diagnosis not present

## 2022-08-10 DIAGNOSIS — Z7984 Long term (current) use of oral hypoglycemic drugs: Secondary | ICD-10-CM

## 2022-08-10 DIAGNOSIS — Z23 Encounter for immunization: Secondary | ICD-10-CM

## 2022-08-10 DIAGNOSIS — Z90710 Acquired absence of both cervix and uterus: Secondary | ICD-10-CM

## 2022-08-10 DIAGNOSIS — E1169 Type 2 diabetes mellitus with other specified complication: Secondary | ICD-10-CM | POA: Diagnosis present

## 2022-08-10 DIAGNOSIS — Y95 Nosocomial condition: Secondary | ICD-10-CM | POA: Diagnosis present

## 2022-08-10 DIAGNOSIS — I447 Left bundle-branch block, unspecified: Secondary | ICD-10-CM | POA: Diagnosis present

## 2022-08-10 DIAGNOSIS — J9621 Acute and chronic respiratory failure with hypoxia: Secondary | ICD-10-CM | POA: Diagnosis present

## 2022-08-10 DIAGNOSIS — Z1152 Encounter for screening for COVID-19: Secondary | ICD-10-CM | POA: Diagnosis not present

## 2022-08-10 DIAGNOSIS — K760 Fatty (change of) liver, not elsewhere classified: Secondary | ICD-10-CM | POA: Diagnosis present

## 2022-08-10 LAB — COMPREHENSIVE METABOLIC PANEL
ALT: 33 U/L (ref 0–44)
AST: 20 U/L (ref 15–41)
Albumin: 3.9 g/dL (ref 3.5–5.0)
Alkaline Phosphatase: 90 U/L (ref 38–126)
Anion gap: 10 (ref 5–15)
BUN: 19 mg/dL (ref 8–23)
CO2: 24 mmol/L (ref 22–32)
Calcium: 9.9 mg/dL (ref 8.9–10.3)
Chloride: 102 mmol/L (ref 98–111)
Creatinine, Ser: 1.2 mg/dL — ABNORMAL HIGH (ref 0.44–1.00)
GFR, Estimated: 49 mL/min — ABNORMAL LOW (ref >60)
Glucose, Bld: 151 mg/dL — ABNORMAL HIGH (ref 70–99)
Potassium: 3.7 mmol/L (ref 3.5–5.1)
Sodium: 136 mmol/L (ref 135–145)
Total Bilirubin: 0.5 mg/dL (ref 0.3–1.2)
Total Protein: 8.4 g/dL — ABNORMAL HIGH (ref 6.5–8.1)

## 2022-08-10 LAB — PROTIME-INR
INR: 1.1 (ref 0.8–1.2)
Prothrombin Time: 14.3 seconds (ref 11.4–15.2)

## 2022-08-10 LAB — CBC WITH DIFFERENTIAL/PLATELET
Abs Immature Granulocytes: 0.07 10*3/uL (ref 0.00–0.07)
Basophils Absolute: 0.1 10*3/uL (ref 0.0–0.1)
Basophils Relative: 0 %
Eosinophils Absolute: 0.2 10*3/uL (ref 0.0–0.5)
Eosinophils Relative: 1 %
HCT: 37.2 % (ref 36.0–46.0)
Hemoglobin: 12.3 g/dL (ref 12.0–15.0)
Immature Granulocytes: 0 %
Lymphocytes Relative: 13 %
Lymphs Abs: 2.7 10*3/uL (ref 0.7–4.0)
MCH: 28.1 pg (ref 26.0–34.0)
MCHC: 33.1 g/dL (ref 30.0–36.0)
MCV: 84.9 fL (ref 80.0–100.0)
Monocytes Absolute: 1 10*3/uL (ref 0.1–1.0)
Monocytes Relative: 5 %
Neutro Abs: 15.9 10*3/uL — ABNORMAL HIGH (ref 1.7–7.7)
Neutrophils Relative %: 81 %
Platelets: 450 10*3/uL — ABNORMAL HIGH (ref 150–400)
RBC: 4.38 MIL/uL (ref 3.87–5.11)
RDW: 12.7 % (ref 11.5–15.5)
WBC: 20 10*3/uL — ABNORMAL HIGH (ref 4.0–10.5)
nRBC: 0 % (ref 0.0–0.2)

## 2022-08-10 LAB — TROPONIN I (HIGH SENSITIVITY): Troponin I (High Sensitivity): 14 ng/L (ref <18)

## 2022-08-10 LAB — LACTIC ACID, PLASMA
Lactic Acid, Venous: 1.8 mmol/L (ref 0.5–1.9)
Lactic Acid, Venous: 1.4 mmol/L (ref 0.5–1.9)

## 2022-08-10 LAB — BRAIN NATRIURETIC PEPTIDE: B Natriuretic Peptide: 264 pg/mL — ABNORMAL HIGH (ref 0.0–100.0)

## 2022-08-10 MED ORDER — SODIUM CHLORIDE 0.9 % IV SOLN
2.0000 g | Freq: Once | INTRAVENOUS | Status: AC
  Administered 2022-08-10: 2 g via INTRAVENOUS
  Filled 2022-08-10: qty 12.5

## 2022-08-10 MED ORDER — HEPARIN BOLUS VIA INFUSION
4000.0000 [IU] | Freq: Once | INTRAVENOUS | Status: AC
  Administered 2022-08-11: 4000 [IU] via INTRAVENOUS

## 2022-08-10 MED ORDER — ONDANSETRON HCL 4 MG/2ML IJ SOLN
4.0000 mg | Freq: Once | INTRAMUSCULAR | Status: AC
  Administered 2022-08-10: 4 mg via INTRAVENOUS
  Filled 2022-08-10: qty 2

## 2022-08-10 MED ORDER — LACTATED RINGERS IV BOLUS
500.0000 mL | Freq: Once | INTRAVENOUS | Status: AC
  Administered 2022-08-10: 500 mL via INTRAVENOUS

## 2022-08-10 MED ORDER — VANCOMYCIN HCL IN DEXTROSE 1-5 GM/200ML-% IV SOLN
1000.0000 mg | Freq: Once | INTRAVENOUS | Status: AC
  Administered 2022-08-10: 1000 mg via INTRAVENOUS
  Filled 2022-08-10: qty 200

## 2022-08-10 MED ORDER — HEPARIN (PORCINE) 25000 UT/250ML-% IV SOLN
1000.0000 [IU]/h | INTRAVENOUS | Status: DC
  Administered 2022-08-11 – 2022-08-12 (×3): 1000 [IU]/h via INTRAVENOUS
  Filled 2022-08-10 (×3): qty 250

## 2022-08-10 MED ORDER — IOHEXOL 350 MG/ML SOLN
75.0000 mL | Freq: Once | INTRAVENOUS | Status: AC | PRN
  Administered 2022-08-10: 75 mL via INTRAVENOUS

## 2022-08-10 MED ORDER — MORPHINE SULFATE (PF) 4 MG/ML IV SOLN
4.0000 mg | Freq: Once | INTRAVENOUS | Status: AC
  Administered 2022-08-10: 4 mg via INTRAVENOUS
  Filled 2022-08-10: qty 1

## 2022-08-10 NOTE — H&P (Signed)
History and Physical    Patient: Summer Christensen BJY:[id number] DOB: [birth year] DOA: 08/10/2022 DOS: the patient was seen and examined on 08/11/2022 PCP: Vidal Schwalbe, MD  Patient coming from: Home  Chief Complaint:  Chief Complaint  Patient presents with   Cough   Pneumonia   HPI: Summer Christensen is a 71 y.o. 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  Pain was constant and was rated as 10/10 on pain scale which worsens on coughing.  She states that she has not been moving around much since being discharged on 10/16.  She denies fever, chills, nausea, vomiting, abdominal pain. Patient was admitted from 10/10 to 10/16 due to acute respiratory failure with hypoxia, acute CHF (systolic and diastolic) and was discharged with Lasix, Coreg, Farxiga, spironolactone and ARB.   ED Course:  In the emergency department, patient was tachypneic and tachycardic.  Temperature was 99.3 F, O2 sats was 97% on room air.  Work-up in the ED showed normal CBC except for WBC of 20.0 and platelets of 450.  BMP was normal except for blood glucose of 151 and creatinine of 1.20 (creatinine is within baseline range).  Lactic acid was normal, troponin x1 - 14.0, BNP 264 (this was 1,157 about 12 days ago). CT angiography chest with contrast showed: 1. Small left upper lobe segmental pulmonary artery emboli. No CT evidence of right heart straining. 2. Right lower lobe pneumonia. There may be areas of early cavitary change at the right lung base. Attention on follow-up imaging recommended. 3. Small left pleural effusion with left lung base partial compressive atelectasis or infiltrate. Overall improved aeration of the left lower lobe compared to  prior CT. 4. Mild cardiomegaly with mild dilatation of the left cardiac chambers. 5. A 5 mm nodule or para fissural lymph node along the left fissure. 6.  Aortic Atherosclerosis  Patient was treated with heparin drip and was started on vancomycin and cefepime due to HCAP.  Morphine and Zofran were given.  Hospitalist was asked to admit patient for further evaluation and management.  Review of Systems: Review of systems as noted in the HPI. All other systems reviewed and are negative.   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   Chest pain    Hx of hysterectomy    Hypertension    Migraine    Past Surgical History:  Procedure Laterality Date   ABDOMINAL HYSTERECTOMY     COLONOSCOPY N/A 06/11/2016   Procedure: COLONOSCOPY;  Surgeon: Rogene Houston, MD;  Location: [facility];  Service: Endoscopy;  Laterality: N/A;  2:00 - moved to 1:00 - Ann notified pt   LAPAROSCOPIC CHOLECYSTECTOMY  2005   pain.   ROTATOR CUFF REPAIR  2009   arthroscopic   TUBAL LIGATION     bilateral    Social History:  reports that she has never smoked. She has never used smokeless tobacco. She reports that she does not drink alcohol and does not use drugs.   No Known Allergies  Family History  Problem Relation Age of Onset   Stroke Mother    Heart attack Father      Prior to Admission medications   Medication Sig Start Date End Date Taking? Authorizing  Provider  aspirin EC 81 MG tablet Take 1 tablet (81 mg total) by mouth daily. Swallow whole. 08/05/22   Vassie Loll, MD  atorvastatin (LIPITOR) 40 MG tablet Take 40 mg by mouth daily. 12/12/21   [provider]  carvedilol (COREG) 3.125 MG tablet Take 1 tablet (3.125 mg total) by mouth 2 (two) times daily with a meal. 08/04/22   Vassie Loll, MD  dicyclomine (BENTYL) 20 MG tablet Take 20 mg by mouth 4 (four) times daily as needed for spasms. 06/24/22   [provider]  empagliflozin (JARDIANCE)  10 MG TABS tablet Take 1 tablet (10 mg total) by mouth daily. 08/05/22   Vassie Loll, MD  furosemide (LASIX) 40 MG tablet Take 1 tablet (40 mg total) by mouth daily. 08/05/22   Vassie Loll, MD  losartan (COZAAR) 25 MG tablet Take 1 tablet (25 mg total) by mouth daily. 08/04/22   Vassie Loll, MD  pantoprazole (PROTONIX) 40 MG tablet Take 1 tablet (40 mg total) by mouth daily. 08/05/22   Vassie Loll, MD  Saccharomyces boulardii (PROBIOTIC) 250 MG CAPS Take 1 capsule by mouth daily. 06/24/22   [provider]  spironolactone (ALDACTONE) 25 MG tablet Take 0.5 tablets (12.5 mg total) by mouth daily. 08/05/22   Vassie Loll, MD    Physical Exam: BP (!) 144/68   Pulse (!) 101   Temp 98.6 F (37 C) (Oral)   Resp (!) 31   Ht 5\' 4"  (1.626 m)   Wt 65.3 kg   SpO2 100%   BMI 24.72 kg/m   General: 71 y.o. year-old female ill appearing, but in no acute distress.  Alert and oriented x3. HEENT: NCAT, EOMI Neck: Supple, trachea medial Cardiovascular: Regular rate and rhythm with no rubs or gallops.  No thyromegaly or JVD noted.  No lower extremity edema. 2/4 pulses in all 4 extremities. Respiratory: Rhonchi in right lower lobe on auscultation with no wheezes or rales.   Abdomen: Soft, nontender nondistended with normal bowel sounds x4 quadrants. Muskuloskeletal: No cyanosis, clubbing or edema noted bilaterally Neuro: CN II-XII intact, strength 5/5 x 4, sensation, reflexes intact Skin: No ulcerative lesions noted or rashes Psychiatry: Judgement and insight appear normal. Mood is appropriate for condition and setting          Labs on Admission:  Basic Metabolic Panel: Recent Labs  Lab 08/04/22 0542 08/10/22 1856  NA 141 136  K 3.4* 3.7  CL 108 102  CO2 24 24  GLUCOSE 126* 151*  BUN 19 19  CREATININE 0.96 1.20*  CALCIUM 9.3 9.9   Liver Function Tests: Recent Labs  Lab 08/10/22 1856  AST 20  ALT 33  ALKPHOS 90  BILITOT 0.5  PROT 8.4*  ALBUMIN 3.9   No results  for input(s): "LIPASE", "AMYLASE" in the last 168 hours. No results for input(s): "AMMONIA" in the last 168 hours. CBC: Recent Labs  Lab 08/10/22 1856  WBC 20.0*  NEUTROABS 15.9*  HGB 12.3  HCT 37.2  MCV 84.9  PLT 450*   Cardiac Enzymes: No results for input(s): "CKTOTAL", "CKMB", "CKMBINDEX", "TROPONINI" in the last 168 hours.  BNP (last 3 results) Recent Labs    07/29/22 0345 08/10/22 1930  BNP 1,157.0* 264.0*    ProBNP (last 3 results) No results for input(s): "PROBNP" in the last 8760 hours.  CBG: No results for input(s): "GLUCAP" in the last 168 hours.  Radiological Exams on Admission: CT Angio Chest PE W/Cm &/Or Wo Cm  Result Date: 08/10/2022 CLINICAL  DATA:  Concern for pulmonary embolism. EXAM: CT ANGIOGRAPHY CHEST WITH CONTRAST TECHNIQUE: Multidetector CT imaging of the chest was performed using the standard protocol during bolus administration of intravenous contrast. Multiplanar CT image reconstructions and MIPs were obtained to evaluate the vascular anatomy. RADIATION DOSE REDUCTION: This exam was performed according to the departmental dose-optimization program which includes automated exposure control, adjustment of the mA and/or kV according to patient size and/or use of iterative reconstruction technique. CONTRAST:  21mL OMNIPAQUE IOHEXOL 350 MG/ML SOLN COMPARISON:  Chest radiograph dated 08/10/2022 and CT dated 07/29/2022. FINDINGS: Evaluation of this exam is limited due to respiratory motion artifact. Cardiovascular: There is mild cardiomegaly with mild dilatation of the left cardiac chambers. No pericardial effusion. Mild atherosclerotic calcification of the thoracic aorta. No aneurysmal dilatation or dissection. Evaluation of the pulmonary arteries is limited due to respiratory motion. Small left upper lobe segmental pulmonary artery emboli noted. No CT evidence of right heart straining. Mediastinum/Nodes: No hilar or mediastinal adenopathy. The esophagus is  grossly unremarkable. No mediastinal fluid collection. Lungs/Pleura: Small left pleural effusion. There is left lung base partial compressive atelectasis or infiltrate. Overall improved aeration of the left lower lobe compared to prior CT. Clusters of airspace opacity in the right lower lobe most consistent with pneumonia. There may be areas of early cavitary change at the right lung base. Attention on follow-up imaging recommended. Small subpleural nodule in the superior segment of the left lower lobe, likely post inflammatory and residual from prior infiltration. There is a 5 mm nodule or para fissural lymph node along the left fissure (71/6). There is no pneumothorax. The central airways are patent. Upper Abdomen: Cholecystectomy. Musculoskeletal: Osteopenia with degenerative changes of the spine. No acute osseous pathology. Review of the MIP images confirms the above findings. IMPRESSION: 1. Small left upper lobe segmental pulmonary artery emboli. No CT evidence of right heart straining. 2. Right lower lobe pneumonia. There may be areas of early cavitary change at the right lung base. Attention on follow-up imaging recommended. 3. Small left pleural effusion with left lung base partial compressive atelectasis or infiltrate. Overall improved aeration of the left lower lobe compared to prior CT. 4. Mild cardiomegaly with mild dilatation of the left cardiac chambers. 5. A 5 mm nodule or para fissural lymph node along the left fissure. 6.  Aortic Atherosclerosis (ICD10-I70.0). These results were called by telephone at the time of interpretation on 08/10/2022 at 10:43 pm to provider Alvino Blood , who verbally acknowledged these results. Electronically Signed   By: Elgie Collard M.D.   On: 08/10/2022 22:44   DG Chest 2 View  Result Date: 08/10/2022 CLINICAL DATA:  Suspected Sepsis EXAM: CHEST - 2 VIEW COMPARISON:  Chest x-ray 08/01/2022, CT chest 07/29/2022 FINDINGS: The heart and mediastinal contours are  unchanged. Question right lower lung zone airspace opacity versus hilar lymph node. Retrocardiac airspace opacity. No pulmonary edema. Small left pleural effusion. No pneumothorax. No acute osseous abnormality. IMPRESSION: 1. Small left pleural effusion. 2. Retrocardiac airspace opacity may represent a combination of infection/inflammation versus atelectasis. 3. Question right lower lung zone airspace opacity versus hilar lymph node. Electronically Signed   By: Tish Frederickson M.D.   On: 08/10/2022 19:10    EKG: I independently viewed the EKG done and my findings are as followed: Sinus tachycardia at a rate of 126 bpm with LBBB  Assessment/Plan Present on Admission:  Pulmonary embolism (HCC)  Acute respiratory failure with hypoxia (HCC)  Pleural effusion  Elevated brain natriuretic peptide (  BNP) level  Essential hypertension  Gastroesophageal reflux disease  Type 2 diabetes mellitus with hyperlipidemia (HCC)  Principal Problem:   Pulmonary embolism (HCC) Active Problems:   Acute respiratory failure with hypoxia (HCC)   Type 2 diabetes mellitus with hyperlipidemia (HCC)   Essential hypertension   Elevated brain natriuretic peptide (BNP) level   Gastroesophageal reflux disease   Pleural effusion   HCAP (healthcare-associated pneumonia)   Acute on chronic combined systolic and diastolic CHF (congestive heart failure) (HCC)  Acute pulmonary embolism CT angiography of chest showed small left upper lobe segmental pulmonary artery emboli. No CT evidence of right heart straining. Patient was started on IV heparin drip with plan to transition to DOAC in the morning Bilateral lower extremity ultrasound will be done in the morning Echocardiogram will be done in the morning Continue lidocaine patch and Tylenol for mild pleuritic chest pain Continue IV morphine 2 mg every 3 hours as needed for moderate/severe pain Consider cardiology consult based on findings  HCAP POA CT of chest showed right  lower lobe pneumonia Patient was started on vancomycin and cefepime, we shall continue same at this time with plan to de-escalate/discontinue based on blood culture, sputum culture, urine Legionella, strep pneumo and procalcitonin Continue Tylenol as needed Continue Mucinex, incentive spirometry, flutter valve   Left pleural effusion and atelectasis Small left pleural effusion noted on CT of chest Continue home Lasix Continue incentive spirometry and flutter valve for atelectasis  Combined systolic and diastolic CHF Elevated BNP BNP 161 (this was 1057 about 12 days ago) Echocardiogram showed HFrEF (LVEF < 20% - 07/29/22), LV demonstrates global hypokinesis.  LV internal cavity size was moderately dilated.  HFpEF (G3 DD), mild to moderate MVR.  No MS. Continue aspirin, Lipitor, Coreg, Lasix, Cozaar Aldactone will be temporarily held due to soft BP  Acute respiratory failure with hypoxia Continue supplemental oxygen via [place] to obtain O2 sat > 92%  T2DM Continue ISS and hypoglycemia protocol  Essential hypertension Continue Coreg, Lasix, Cozaar, Aldactone with holding parameters  GERD Continue Protonix  Mixed hyperlipidemia Continue Lipitor  DVT prophylaxis: Heparin drip  Code Status: Full code  Consults: None  Family Communication: Son at bedside (all questions answered to satisfaction)  Severity of Illness: The appropriate patient status for this patient is INPATIENT. Inpatient status is judged to be reasonable and necessary in order to provide the required intensity of service to ensure the patient's safety. The patient's presenting symptoms, physical exam findings, and initial radiographic and laboratory data in the context of their chronic comorbidities is felt to place them at high risk for further clinical deterioration. Furthermore, it is not anticipated that the patient will be medically stable for discharge from the hospital within 2 midnights of admission.   * I  certify that at the point of admission it is my clinical judgment that the patient will require inpatient hospital care spanning beyond 2 midnights from the point of admission due to high intensity of service, high risk for further deterioration and high frequency of surveillance required.*  Author: Frankey Shown, DO 08/11/2022 12:26 AM  For on call review [email or web address].

## 2022-08-10 NOTE — ED Provider Notes (Signed)
[facility] EMERGENCY DEPARTMENT Provider Note  CSN: [id number] Arrival date & time: 08/10/22 1813  Chief Complaint(s) Cough and Pneumonia  HPI Summer Christensen is a 71 y.o. female, recent diagnosis of CHF, recently hospitalized with acute hypoxic respiratory failure on 2 L oxygen at home now, diabetes presenting with left-sided chest pain.  Patient reports left-sided chest pain, worsening.  She also reports dry cough since Wednesday.  No fevers or chills, does report generalized weakness.  No nausea, vomiting, abdominal pain.  Pain is worse with deep breathing.  Pain is severe.  She reports worsening shortness of breath.  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   Chest pain    Hx of hysterectomy    Hypertension    Migraine    Patient Active Problem List   Diagnosis Date Noted   Pulmonary embolism ([place]) 08/10/2022   Community acquired pneumonia    Acute clinical systolic heart failure ([place])    Hypokalemia 07/30/2022   Acute respiratory failure with hypoxia ([place]) 07/29/2022   Sepsis due to undetermined organism ([place]) 07/29/2022   Hypertensive emergency 07/29/2022   Pleural effusion 07/29/2022   Elevated brain natriuretic peptide (BNP) level 07/29/2022   Lobar pneumonia ([place]) 07/29/2022   Elevated troponin 07/29/2022   Type 2 diabetes mellitus with hyperlipidemia ([place]) 07/29/2022   Anxiety disorder 01/14/2021   Chronic pain syndrome 01/14/2021   Gastroesophageal reflux disease 01/14/2021   Intra-abdominal and pelvic swelling, mass and lump, unspecified site 01/14/2021   Major depression, single episode 01/14/2021   Sciatica 01/14/2021   Essential hypertension    LEFT BUNDLE BRANCH BLOCK 12/16/2010   MIGRAINE HEADACHE 12/03/2010   CHEST PAIN 12/03/2010   Home Medication(s) Prior to Admission medications   Medication Sig Start Date End Date Taking? Authorizing Provider  aspirin EC 81 MG tablet Take 1 tablet (81 mg  total) by mouth daily. Swallow whole. 08/05/22   Barton Dubois, MD  atorvastatin (LIPITOR) 40 MG tablet Take 40 mg by mouth daily. 12/12/21   [provider]  carvedilol (COREG) 3.125 MG tablet Take 1 tablet (3.125 mg total) by mouth 2 (two) times daily with a meal. 08/04/22   Barton Dubois, MD  dicyclomine (BENTYL) 20 MG tablet Take 20 mg by mouth 4 (four) times daily as needed for spasms. 06/24/22   [provider]  empagliflozin (JARDIANCE) 10 MG TABS tablet Take 1 tablet (10 mg total) by mouth daily. 08/05/22   Barton Dubois, MD  furosemide (LASIX) 40 MG tablet Take 1 tablet (40 mg total) by mouth daily. 08/05/22   Barton Dubois, MD  losartan (COZAAR) 25 MG tablet Take 1 tablet (25 mg total) by mouth daily. 08/04/22   Barton Dubois, MD  pantoprazole (PROTONIX) 40 MG tablet Take 1 tablet (40 mg total) by mouth daily. 08/05/22   Barton Dubois, MD  Saccharomyces boulardii (PROBIOTIC) 250 MG CAPS Take 1 capsule by mouth daily. 06/24/22   [provider]  spironolactone (ALDACTONE) 25 MG tablet Take 0.5 tablets (12.5 mg total) by mouth daily. 08/05/22   Barton Dubois, MD  Past Surgical History Past Surgical History:  Procedure Laterality Date   ABDOMINAL HYSTERECTOMY     COLONOSCOPY N/A 06/11/2016   Procedure: COLONOSCOPY;  Surgeon: Rogene Houston, MD;  Location: [facility];  Service: Endoscopy;  Laterality: N/A;  2:00 - moved to 1:00 - Ann notified pt   LAPAROSCOPIC CHOLECYSTECTOMY  2005   pain.   ROTATOR CUFF REPAIR  2009   arthroscopic   TUBAL LIGATION     bilateral   Family History Family History  Problem Relation Age of Onset   Stroke Mother    Heart attack Father     Social History Social History   Tobacco Use   Smoking status: Never   Smokeless tobacco: Never  Substance Use Topics   Alcohol use: No   Drug use: No    Allergies Patient has no known allergies.  Review of Systems Review of Systems  All other systems reviewed and are negative.   Physical Exam Vital Signs  I have reviewed the triage vital signs BP (!) 144/68   Pulse (!) 101   Temp 98.6 F (37 C) (Oral)   Resp (!) 31   Ht 5\' 4"  (1.626 m)   Wt 65.3 kg   SpO2 100%   BMI 24.72 kg/m  Physical Exam Vitals and nursing note reviewed.  Constitutional:      General: She is not in acute distress.    Appearance: She is well-developed.  HENT:     Head: Normocephalic and atraumatic.     Mouth/Throat:     Mouth: Mucous membranes are moist.  Eyes:     Pupils: Pupils are equal, round, and reactive to light.  Cardiovascular:     Rate and Rhythm: Regular rhythm. Tachycardia present.     Heart sounds: No murmur heard. Pulmonary:     Comments: Mild increased work of breathing.  Exam with left basilar crackles, otherwise clear Abdominal:     General: Abdomen is flat.     Palpations: Abdomen is soft.     Tenderness: There is no abdominal tenderness.  Musculoskeletal:        General: No tenderness.     Right lower leg: No edema.     Left lower leg: No edema.  Skin:    General: Skin is warm and dry.  Neurological:     General: No focal deficit present.     Mental Status: She is alert. Mental status is at baseline.  Psychiatric:        Mood and Affect: Mood normal.        Behavior: Behavior normal.     ED Results and Treatments Labs (all labs ordered are listed, but only abnormal results are displayed) Labs Reviewed  COMPREHENSIVE METABOLIC PANEL - Abnormal; Notable for the following components:      Result Value   Glucose, Bld 151 (*)    Creatinine, Ser 1.20 (*)    Total Protein 8.4 (*)    GFR, Estimated 49 (*)    All other components within normal limits  CBC WITH DIFFERENTIAL/PLATELET - Abnormal; Notable for the following components:   WBC 20.0 (*)    Platelets 450 (*)    Neutro Abs 15.9 (*)    All other components  within normal limits  BRAIN NATRIURETIC PEPTIDE - Abnormal; Notable for the following components:   B Natriuretic Peptide 264.0 (*)    All other components within normal limits  CULTURE, BLOOD (ROUTINE X 2)  CULTURE, BLOOD (ROUTINE X 2)  LACTIC  ACID, PLASMA  LACTIC ACID, PLASMA  PROTIME-INR  URINALYSIS, ROUTINE W REFLEX MICROSCOPIC  TROPONIN I (HIGH SENSITIVITY)                                                                                                                          Radiology CT Angio Chest PE W/Cm &/Or Wo Cm  Result Date: 08/10/2022 CLINICAL DATA:  Concern for pulmonary embolism. EXAM: CT ANGIOGRAPHY CHEST WITH CONTRAST TECHNIQUE: Multidetector CT imaging of the chest was performed using the standard protocol during bolus administration of intravenous contrast. Multiplanar CT image reconstructions and MIPs were obtained to evaluate the vascular anatomy. RADIATION DOSE REDUCTION: This exam was performed according to the departmental dose-optimization program which includes automated exposure control, adjustment of the mA and/or kV according to patient size and/or use of iterative reconstruction technique. CONTRAST:  15mL OMNIPAQUE IOHEXOL 350 MG/ML SOLN COMPARISON:  Chest radiograph dated 08/10/2022 and CT dated 07/29/2022. FINDINGS: Evaluation of this exam is limited due to respiratory motion artifact. Cardiovascular: There is mild cardiomegaly with mild dilatation of the left cardiac chambers. No pericardial effusion. Mild atherosclerotic calcification of the thoracic aorta. No aneurysmal dilatation or dissection. Evaluation of the pulmonary arteries is limited due to respiratory motion. Small left upper lobe segmental pulmonary artery emboli noted. No CT evidence of right heart straining. Mediastinum/Nodes: No hilar or mediastinal adenopathy. The esophagus is grossly unremarkable. No mediastinal fluid collection. Lungs/Pleura: Small left pleural effusion. There is left lung base  partial compressive atelectasis or infiltrate. Overall improved aeration of the left lower lobe compared to prior CT. Clusters of airspace opacity in the right lower lobe most consistent with pneumonia. There may be areas of early cavitary change at the right lung base. Attention on follow-up imaging recommended. Small subpleural nodule in the superior segment of the left lower lobe, likely post inflammatory and residual from prior infiltration. There is a 5 mm nodule or para fissural lymph node along the left fissure (71/6). There is no pneumothorax. The central airways are patent. Upper Abdomen: Cholecystectomy. Musculoskeletal: Osteopenia with degenerative changes of the spine. No acute osseous pathology. Review of the MIP images confirms the above findings. IMPRESSION: 1. Small left upper lobe segmental pulmonary artery emboli. No CT evidence of right heart straining. 2. Right lower lobe pneumonia. There may be areas of early cavitary change at the right lung base. Attention on follow-up imaging recommended. 3. Small left pleural effusion with left lung base partial compressive atelectasis or infiltrate. Overall improved aeration of the left lower lobe compared to prior CT. 4. Mild cardiomegaly with mild dilatation of the left cardiac chambers. 5. A 5 mm nodule or para fissural lymph node along the left fissure. 6.  Aortic Atherosclerosis (ICD10-I70.0). These results were called by telephone at the time of interpretation on 08/10/2022 at 10:43 pm to provider Garnette Gunner , who verbally acknowledged these results. Electronically Signed   By: Anner Crete M.D.   On: 08/10/2022 22:44   DG Chest  2 View  Result Date: 08/10/2022 CLINICAL DATA:  Suspected Sepsis EXAM: CHEST - 2 VIEW COMPARISON:  Chest x-ray 08/01/2022, CT chest 07/29/2022 FINDINGS: The heart and mediastinal contours are unchanged. Question right lower lung zone airspace opacity versus hilar lymph node. Retrocardiac airspace opacity. No  pulmonary edema. Small left pleural effusion. No pneumothorax. No acute osseous abnormality. IMPRESSION: 1. Small left pleural effusion. 2. Retrocardiac airspace opacity may represent a combination of infection/inflammation versus atelectasis. 3. Question right lower lung zone airspace opacity versus hilar lymph node. Electronically Signed   By: Iven Finn M.D.   On: 08/10/2022 19:10    Pertinent labs & imaging results that were available during my care of the patient were reviewed by me and considered in my medical decision making (see MDM for details).  Medications Ordered in ED Medications  ceFEPIme (MAXIPIME) 2 g in sodium chloride 0.9 % 100 mL IVPB (2 g Intravenous New Bag/Given 08/10/22 2239)  morphine (PF) 4 MG/ML injection 4 mg (has no administration in time range)  vancomycin (VANCOCIN) IVPB 1000 mg/200 mL premix (has no administration in time range)  heparin bolus via infusion 4,000 Units (has no administration in time range)  heparin ADULT infusion 100 units/mL (25000 units/254mL) (has no administration in time range)  iohexol (OMNIPAQUE) 350 MG/ML injection 75 mL (75 mLs Intravenous Contrast Given 08/10/22 2225)  lactated ringers bolus 500 mL (500 mLs Intravenous New Bag/Given 08/10/22 2239)  ondansetron (ZOFRAN) injection 4 mg (4 mg Intravenous Given 08/10/22 2302)                                                                                                                                     Procedures .Critical Care  Performed by: Cristie Hem, MD Authorized by: Cristie Hem, MD   Critical care provider statement:    Critical care time (minutes):  30   Critical care end time:  08/10/2022 11:05 PM   Critical care time was exclusive of:  Separately billable procedures and treating other patients   Critical care was necessary to treat or prevent imminent or life-threatening deterioration of the following conditions:  Cardiac failure, respiratory failure and  sepsis   Critical care was time spent personally by me on the following activities:  Development of treatment plan with patient or surrogate, evaluation of patient's response to treatment, examination of patient, ordering and review of laboratory studies, ordering and review of radiographic studies, ordering and performing treatments and interventions, pulse oximetry, re-evaluation of patient's condition and review of old charts   Care discussed with: admitting provider     (including critical care time)  Medical Decision Making / ED Course   MDM:  71 year old female presenting to the emergency department with chest pain.  Patient mildly ill-appearing, with increased work of breathing.  Exam with left basilar crackles.  Differential includes recurrent pneumonia, CHF exacerbation, pulmonary embolism, ACS, pneumothorax, musculoskeletal chest pain.  Given  recent hospitalization, cough, obtained x-ray which demonstrates bibasilar opacities concerning for infectious process.  Patient has elevated white count of 20 which is worse than time of discharge.  Given chest pain, recent hospitalization CT angiography performed which redemonstrates bibasilar infiltrates, possible right lung cavitary changes, small subsegmental left-sided pulmonary emboli.  Will initiate antibiotics for healthcare associated pneumonia.  We will also initiate heparin per pharmacy for new diagnosis of pulmonary embolism.  No evidence of right heart strain on CT scan.  Lower concern for acute CHF, patient does not appear volume overloaded, appears somewhat dehydrated.  Lower concern for ACS, EKG not suggestive and troponin is negative.         Additional history obtained: -Additional history obtained from family -External records from outside source obtained and reviewed including: Chart review including previous notes, labs, imaging, consultation notes including discharge summary from admission earlier this month   Lab  Tests: -I ordered, reviewed, and interpreted labs.   The pertinent results include:   Labs Reviewed  COMPREHENSIVE METABOLIC PANEL - Abnormal; Notable for the following components:      Result Value   Glucose, Bld 151 (*)    Creatinine, Ser 1.20 (*)    Total Protein 8.4 (*)    GFR, Estimated 49 (*)    All other components within normal limits  CBC WITH DIFFERENTIAL/PLATELET - Abnormal; Notable for the following components:   WBC 20.0 (*)    Platelets 450 (*)    Neutro Abs 15.9 (*)    All other components within normal limits  BRAIN NATRIURETIC PEPTIDE - Abnormal; Notable for the following components:   B Natriuretic Peptide 264.0 (*)    All other components within normal limits  CULTURE, BLOOD (ROUTINE X 2)  CULTURE, BLOOD (ROUTINE X 2)  LACTIC ACID, PLASMA  LACTIC ACID, PLASMA  PROTIME-INR  URINALYSIS, ROUTINE W REFLEX MICROSCOPIC  TROPONIN I (HIGH SENSITIVITY)    Notable for leukocytosis, BNP improved from prior  EKG   EKG Interpretation  Date/Time:  Sunday August 10 2022 21:30:32 EDT Ventricular Rate:  126 PR Interval:  146 QRS Duration: 146 QT Interval:  302 QTC Calculation: 437 R Axis:   39 Text Interpretation: Sinus tachycardia Left bundle branch block Abnormal ECG When compared with ECG of 31-Jul-2022 19:10, No significant change since last tracing Confirmed by Alvino Blood ([id number]) on 08/10/2022 9:38:41 PM         Imaging Studies ordered: I ordered imaging studies including CTA chest On my interpretation imaging demonstrates bibasilar infiltrate, left subsegmental PE I independently visualized and interpreted imaging. I agree with the radiologist interpretation   Medicines ordered and prescription drug management: Meds ordered this encounter  Medications   iohexol (OMNIPAQUE) 350 MG/ML injection 75 mL   ceFEPIme (MAXIPIME) 2 g in sodium chloride 0.9 % 100 mL IVPB   lactated ringers bolus 500 mL   morphine (PF) 4 MG/ML injection 4 mg    ondansetron (ZOFRAN) injection 4 mg   vancomycin (VANCOCIN) IVPB 1000 mg/200 mL premix    Order Specific Question:   Indication:    Answer:   Other Indication (list below)   heparin bolus via infusion 4,000 Units   heparin ADULT infusion 100 units/mL (25000 units/264mL)    -I have reviewed the patients home medicines and have made adjustments as needed   Consultations Obtained: I requested consultation with the hospitalist,  and discussed lab and imaging findings as well as pertinent plan - they recommend: admission   Cardiac Monitoring: The patient was  maintained on a cardiac monitor.  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    Final Clinical Impression(s) / ED Diagnoses Final diagnoses:  HCAP (healthcare-associated pneumonia)  Multiple subsegmental pulmonary emboli without acute cor pulmonale ([place])     This chart was dictated using voice recognition software.  Despite best efforts to proofread,  errors can occur which can change the documentation meaning.    Cristie Hem, MD 08/10/22 [phone]

## 2022-08-10 NOTE — ED Notes (Signed)
Provider at bedside

## 2022-08-10 NOTE — ED Triage Notes (Signed)
Left side flank pain that has worse. Pt finished ABX. Cough that started on Wednesday.   Started oxygen 2L [place] when last discharged for PNA  98.7 T CBG 157 BP 138/93 HR 100

## 2022-08-10 NOTE — H&P (Incomplete)
History and Physical    Patient: Summer Christensen VEH:[id number] DOB: 1951/02/09 DOA: 08/10/2022 DOS: the patient was seen and examined on 08/10/2022 PCP: Smith Robert, MD  Patient coming from: Home  Chief Complaint:  Chief Complaint  Patient presents with  . Cough  . Pneumonia   HPI: Summer Christensen is a 71 y.o. 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  Pain was constant and was rated as 10/10 on pain scale which worsens on coughing.  She states that she has not been moving around much since being discharged on 10/16.  She denies fever, chills, nausea, vomiting, abdominal pain. Patient was admitted from 10/10 to 10/16 due to acute respiratory failure with hypoxia, acute CHF (systolic and diastolic) and was discharged with Lasix, Coreg, Farxiga, spironolactone and ARB.   ED Course:  In the emergency department, patient was tachypneic and tachycardic.  Temperature was 99.3 F, O2 sats was 97% on room air.  Work-up in the ED showed normal CBC except for WBC of 20.0 and platelets of 450.  BMP was normal except for blood glucose of 151 and creatinine of 1.20 (creatinine is within baseline range).  Lactic acid was normal, troponin x1 - 14.0, BNP 264 (this was 1,157 about 12 days ago). CT angiography chest with contrast showed: 1. Small left upper lobe segmental pulmonary artery emboli. No CT evidence of right heart straining. 2. Right lower lobe pneumonia. There may be areas of early cavitary change at the right lung base. Attention on follow-up imaging recommended. 3. Small left pleural effusion with left lung base partial compressive atelectasis or infiltrate. Overall improved aeration of the left lower lobe compared to  prior CT. 4. Mild cardiomegaly with mild dilatation of the left cardiac chambers. 5. A 5 mm nodule or para fissural lymph node along the left fissure. 6.  Aortic Atherosclerosis  Patient was treated with heparin drip and was started on vancomycin and cefepime due to HCAP.  Morphine and Zofran were given.  Hospitalist was asked to admit patient for further evaluation and management.  Review of Systems: Review of systems as noted in the HPI. All other systems reviewed and are negative.   Past Medical History:  Diagnosis Date  . 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 Chest pain   . Hx of hysterectomy   . Hypertension   . Migraine    Past Surgical History:  Procedure Laterality Date  . ABDOMINAL HYSTERECTOMY    . COLONOSCOPY N/A 06/11/2016   Procedure: COLONOSCOPY;  Surgeon: Malissa Hippo, MD;  [facility];  Service: Endoscopy;  Laterality: N/A;  2:00 - moved to 1:00 - Ann notified pt  . LAPAROSCOPIC CHOLECYSTECTOMY  2005   pain.  Marland Kitchen ROTATOR CUFF REPAIR  2009   arthroscopic  . TUBAL LIGATION     bilateral    Social History:  reports that she has never smoked. She has never used smokeless tobacco. She reports that she does not drink alcohol and does not use drugs.   No Known Allergies  Family History  Problem Relation Age of Onset  . Stroke Mother   . Heart attack Father     ***  Prior to Admission medications   Medication Sig Start Date End Date Taking?  Authorizing Provider  aspirin EC 81 MG tablet Take 1 tablet (81 mg total) by mouth daily. Swallow whole. 08/05/22   Vassie Loll, MD  atorvastatin (LIPITOR) 40 MG tablet Take 40 mg by mouth daily. 12/12/21   [provider]  carvedilol (COREG) 3.125 MG tablet Take 1 tablet (3.125 mg total) by mouth 2 (two) times daily with a meal. 08/04/22   Vassie Loll, MD  dicyclomine (BENTYL) 20 MG tablet Take 20 mg by mouth 4 (four) times daily as needed for spasms. 06/24/22   [provider]   empagliflozin (JARDIANCE) 10 MG TABS tablet Take 1 tablet (10 mg total) by mouth daily. 08/05/22   Vassie Loll, MD  furosemide (LASIX) 40 MG tablet Take 1 tablet (40 mg total) by mouth daily. 08/05/22   Vassie Loll, MD  losartan (COZAAR) 25 MG tablet Take 1 tablet (25 mg total) by mouth daily. 08/04/22   Vassie Loll, MD  pantoprazole (PROTONIX) 40 MG tablet Take 1 tablet (40 mg total) by mouth daily. 08/05/22   Vassie Loll, MD  Saccharomyces boulardii (PROBIOTIC) 250 MG CAPS Take 1 capsule by mouth daily. 06/24/22   [provider]  spironolactone (ALDACTONE) 25 MG tablet Take 0.5 tablets (12.5 mg total) by mouth daily. 08/05/22   Vassie Loll, MD    Physical Exam: BP (!) 144/68   Pulse (!) 101   Temp 98.6 F (37 C) (Oral)   Resp (!) 31   Ht 5\' 4"  (1.626 m)   Wt 65.3 kg   SpO2 100%   BMI 24.72 kg/m   General: 70 y.o. year-old female ill appearing, but in no acute distress.  Alert and oriented x3. HEENT: NCAT, EOMI Neck: Supple, trachea medial Cardiovascular: Regular rate and rhythm with no rubs or gallops.  No thyromegaly or JVD noted.  No lower extremity edema. 2/4 pulses in all 4 extremities. Respiratory: Rhonchi in right lower lobe on auscultation with no wheezes or rales.   Abdomen: Soft, nontender nondistended with normal bowel sounds x4 quadrants. Muskuloskeletal: No cyanosis, clubbing or edema noted bilaterally Neuro: CN II-XII intact, strength 5/5 x 4, sensation, reflexes intact Skin: No ulcerative lesions noted or rashes Psychiatry: Judgement and insight appear normal. Mood is appropriate for condition and setting          Labs on Admission:  Basic Metabolic Panel: Recent Labs  Lab 08/04/22 0542 08/10/22 1856  NA 141 136  K 3.4* 3.7  CL 108 102  CO2 24 24  GLUCOSE 126* 151*  BUN 19 19  CREATININE 0.96 1.20*  CALCIUM 9.3 9.9   Liver Function Tests: Recent Labs  Lab 08/10/22 1856  AST 20  ALT 33  ALKPHOS 90  BILITOT 0.5  PROT 8.4*   ALBUMIN 3.9   No results for input(s): "LIPASE", "AMYLASE" in the last 168 hours. No results for input(s): "AMMONIA" in the last 168 hours. CBC: Recent Labs  Lab 08/10/22 1856  WBC 20.0*  NEUTROABS 15.9*  HGB 12.3  HCT 37.2  MCV 84.9  PLT 450*   Cardiac Enzymes: No results for input(s): "CKTOTAL", "CKMB", "CKMBINDEX", "TROPONINI" in the last 168 hours.  BNP (last 3 results) Recent Labs    07/29/22 0345 08/10/22 1930  BNP 1,157.0* 264.0*    ProBNP (last 3 results) No results for input(s): "PROBNP" in the last 8760 hours.  CBG: No results for input(s): "GLUCAP" in the last 168 hours.  Radiological Exams on Admission: CT Angio Chest PE W/Cm &/Or Wo Cm  Result Date: 08/10/2022  CLINICAL DATA:  Concern for pulmonary embolism. EXAM: CT ANGIOGRAPHY CHEST WITH CONTRAST TECHNIQUE: Multidetector CT imaging of the chest was performed using the standard protocol during bolus administration of intravenous contrast. Multiplanar CT image reconstructions and MIPs were obtained to evaluate the vascular anatomy. RADIATION DOSE REDUCTION: This exam was performed according to the departmental dose-optimization program which includes automated exposure control, adjustment of the mA and/or kV according to patient size and/or use of iterative reconstruction technique. CONTRAST:  22mL OMNIPAQUE IOHEXOL 350 MG/ML SOLN COMPARISON:  Chest radiograph dated 08/10/2022 and CT dated 07/29/2022. FINDINGS: Evaluation of this exam is limited due to respiratory motion artifact. Cardiovascular: There is mild cardiomegaly with mild dilatation of the left cardiac chambers. No pericardial effusion. Mild atherosclerotic calcification of the thoracic aorta. No aneurysmal dilatation or dissection. Evaluation of the pulmonary arteries is limited due to respiratory motion. Small left upper lobe segmental pulmonary artery emboli noted. No CT evidence of right heart straining. Mediastinum/Nodes: No hilar or mediastinal  adenopathy. The esophagus is grossly unremarkable. No mediastinal fluid collection. Lungs/Pleura: Small left pleural effusion. There is left lung base partial compressive atelectasis or infiltrate. Overall improved aeration of the left lower lobe compared to prior CT. Clusters of airspace opacity in the right lower lobe most consistent with pneumonia. There may be areas of early cavitary change at the right lung base. Attention on follow-up imaging recommended. Small subpleural nodule in the superior segment of the left lower lobe, likely post inflammatory and residual from prior infiltration. There is a 5 mm nodule or para fissural lymph node along the left fissure (71/6). There is no pneumothorax. The central airways are patent. Upper Abdomen: Cholecystectomy. Musculoskeletal: Osteopenia with degenerative changes of the spine. No acute osseous pathology. Review of the MIP images confirms the above findings. IMPRESSION: 1. Small left upper lobe segmental pulmonary artery emboli. No CT evidence of right heart straining. 2. Right lower lobe pneumonia. There may be areas of early cavitary change at the right lung base. Attention on follow-up imaging recommended. 3. Small left pleural effusion with left lung base partial compressive atelectasis or infiltrate. Overall improved aeration of the left lower lobe compared to prior CT. 4. Mild cardiomegaly with mild dilatation of the left cardiac chambers. 5. A 5 mm nodule or para fissural lymph node along the left fissure. 6.  Aortic Atherosclerosis (ICD10-I70.0). These results were called by telephone at the time of interpretation on 08/10/2022 at 10:43 pm to provider Alvino Blood , who verbally acknowledged these results. Electronically Signed   By: Elgie Collard M.D.   On: 08/10/2022 22:44   DG Chest 2 View  Result Date: 08/10/2022 CLINICAL DATA:  Suspected Sepsis EXAM: CHEST - 2 VIEW COMPARISON:  Chest x-ray 08/01/2022, CT chest 07/29/2022 FINDINGS: The heart  and mediastinal contours are unchanged. Question right lower lung zone airspace opacity versus hilar lymph node. Retrocardiac airspace opacity. No pulmonary edema. Small left pleural effusion. No pneumothorax. No acute osseous abnormality. IMPRESSION: 1. Small left pleural effusion. 2. Retrocardiac airspace opacity may represent a combination of infection/inflammation versus atelectasis. 3. Question right lower lung zone airspace opacity versus hilar lymph node. Electronically Signed   By: Tish Frederickson M.D.   On: 08/10/2022 19:10    EKG: I independently viewed the EKG done and my findings are as followed: Sinus tachycardia at a rate of 126 bpm with LBBB  Assessment/Plan Present on Admission: . Pulmonary embolism (HCC)  Principal Problem:   Pulmonary embolism (HCC)  Acute pulmonary embolism CT angiography  of chest showed small left upper lobe segmental pulmonary artery emboli. No CT evidence of right heart straining. Patient was started on IV heparin drip with plan to transition to DOAC in the morning Bilateral lower extremity ultrasound will be done in the morning Echocardiogram will be done in the morning  HCAP POA CT of chest showed right lower lobe pneumonia Patient was started on vancomycin and cefepime, we shall continue same at this time with plan to de-escalate/discontinue based on blood culture, sputum culture, urine Legionella, strep pneumo and procalcitonin Continue Tylenol as needed Continue Mucinex, incentive spirometry, flutter valve   Left pleural effusion Combined systolic and diastolic CHF Elevated BNP T2DM Essential hypertension    DVT prophylaxis: ***   Code Status: ***   Family Communication: ***   Disposition Plan: ***   Consults called: ***   Admission status: ***     Frankey Shown MD [facility] Pager [phone]  If 7PM-7AM, please contact night-coverage [email or web address] Password [facility]  08/10/2022, 10:55 PM        Review of  Systems: {ROS_Text:26778} Past Medical History:  Diagnosis Date  . 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 Chest pain   . Hx of hysterectomy   . Hypertension   . Migraine    Past Surgical History:  Procedure Laterality Date  . ABDOMINAL HYSTERECTOMY    . COLONOSCOPY N/A 06/11/2016   Procedure: COLONOSCOPY;  Surgeon: Malissa Hippo, MD;  Location[facility];  Service: Endoscopy;  Laterality: N/A;  2:00 - moved to 1:00 - Ann notified pt  . LAPAROSCOPIC CHOLECYSTECTOMY  2005   pain.  Marland Kitchen ROTATOR CUFF REPAIR  2009   arthroscopic  . TUBAL LIGATION     bilateral   Social History:  reports that she has never smoked. She has never used smokeless tobacco. She reports that she does not drink alcohol and does not use drugs.  No Known Allergies  Family History  Problem Relation Age of Onset  . Stroke Mother   . Heart attack Father     Prior to Admission medications   Medication Sig Start Date End Date Taking? Authorizing Provider  aspirin EC 81 MG tablet Take 1 tablet (81 mg total) by mouth daily. Swallow whole. 08/05/22   Vassie Loll, MD  atorvastatin (LIPITOR) 40 MG tablet Take 40 mg by mouth daily. 12/12/21   [provider]  carvedilol (COREG) 3.125 MG tablet Take 1 tablet (3.125 mg total) by mouth 2 (two) times daily with a meal. 08/04/22   Vassie Loll, MD  dicyclomine (BENTYL) 20 MG tablet Take 20 mg by mouth 4 (four) times daily as needed for spasms. 06/24/22   [provider]  empagliflozin (JARDIANCE) 10 MG TABS tablet Take 1 tablet (10 mg total) by mouth daily. 08/05/22   Vassie Loll, MD  furosemide (LASIX) 40 MG tablet Take 1 tablet (40 mg total) by mouth daily. 08/05/22   Vassie Loll, MD  losartan (COZAAR) 25 MG tablet Take 1 tablet (25 mg total) by mouth daily. 08/04/22   Vassie Loll, MD  pantoprazole (PROTONIX) 40 MG tablet Take 1 tablet (40 mg total) by mouth daily. 08/05/22   Vassie Loll, MD  Saccharomyces boulardii (PROBIOTIC) 250 MG  CAPS Take 1 capsule by mouth daily. 06/24/22   [provider]  spironolactone (ALDACTONE) 25 MG tablet Take 0.5 tablets (12.5 mg total) by mouth daily. 08/05/22   Vassie Loll, MD    Physical Exam: Vitals:   08/10/22 1819 08/10/22 2123 08/10/22  2200 08/10/22 2230  BP: 107/70 127/70 130/77 (!) 144/68  Pulse: (!) 106 (!) 130 (!) 125 (!) 101  Resp: (!) 23 (!) 25 (!) 34 (!) 31  Temp: 99.3 F (37.4 C) 98.6 F (37 C)    TempSrc: Oral Oral    SpO2: 97% 98% 98% 100%  Weight:      Height:       *** Data Reviewed: {Tip this will not be part of the note when signed- Document your independent interpretation of telemetry tracing, EKG, lab, Radiology test or any other diagnostic tests. Add any new diagnostic test ordered today. (Optional):26781} {Results:26384}  Assessment and Plan: No notes have been filed under this hospital service. Service: Hospitalist     Advance Care Planning:   Code Status: Prior ***  Consults: ***  Family Communication: ***  Severity of Illness: {Observation/Inpatient:21159}  Author: Bernadette Hoit, DO 08/10/2022 10:54 PM  For on call review [email or web address].

## 2022-08-11 ENCOUNTER — Inpatient Hospital Stay (HOSPITAL_COMMUNITY): Payer: Medicare Other

## 2022-08-11 DIAGNOSIS — J9601 Acute respiratory failure with hypoxia: Secondary | ICD-10-CM | POA: Diagnosis not present

## 2022-08-11 DIAGNOSIS — I5022 Chronic systolic (congestive) heart failure: Secondary | ICD-10-CM

## 2022-08-11 DIAGNOSIS — I2693 Single subsegmental pulmonary embolism without acute cor pulmonale: Secondary | ICD-10-CM

## 2022-08-11 DIAGNOSIS — I2699 Other pulmonary embolism without acute cor pulmonale: Secondary | ICD-10-CM | POA: Diagnosis not present

## 2022-08-11 DIAGNOSIS — I10 Essential (primary) hypertension: Secondary | ICD-10-CM | POA: Diagnosis not present

## 2022-08-11 DIAGNOSIS — J850 Gangrene and necrosis of lung: Secondary | ICD-10-CM

## 2022-08-11 LAB — COMPREHENSIVE METABOLIC PANEL
ALT: 107 U/L — ABNORMAL HIGH (ref 0–44)
AST: 247 U/L — ABNORMAL HIGH (ref 15–41)
Albumin: 3.3 g/dL — ABNORMAL LOW (ref 3.5–5.0)
Alkaline Phosphatase: 126 U/L (ref 38–126)
Anion gap: 10 (ref 5–15)
BUN: 18 mg/dL (ref 8–23)
CO2: 25 mmol/L (ref 22–32)
Calcium: 9.4 mg/dL (ref 8.9–10.3)
Chloride: 102 mmol/L (ref 98–111)
Creatinine, Ser: 0.94 mg/dL (ref 0.44–1.00)
GFR, Estimated: >60 mL/min (ref >60)
Glucose, Bld: 141 mg/dL — ABNORMAL HIGH (ref 70–99)
Potassium: 4 mmol/L (ref 3.5–5.1)
Sodium: 137 mmol/L (ref 135–145)
Total Bilirubin: 0.8 mg/dL (ref 0.3–1.2)
Total Protein: 7 g/dL (ref 6.5–8.1)

## 2022-08-11 LAB — ECHOCARDIOGRAM LIMITED
Calc EF: 22.7 %
Height: 64 in
S' Lateral: 5.6 cm
Single Plane A2C EF: 32.5 %
Single Plane A4C EF: 13.1 %
Weight: 2304 oz

## 2022-08-11 LAB — HEPATITIS C ANTIBODY: HCV Ab: NONREACTIVE

## 2022-08-11 LAB — CBC
HCT: 37.5 % (ref 36.0–46.0)
Hemoglobin: 12.2 g/dL (ref 12.0–15.0)
MCH: 28 pg (ref 26.0–34.0)
MCHC: 32.5 g/dL (ref 30.0–36.0)
MCV: 86.2 fL (ref 80.0–100.0)
Platelets: 398 10*3/uL (ref 150–400)
RBC: 4.35 MIL/uL (ref 3.87–5.11)
RDW: 12.6 % (ref 11.5–15.5)
WBC: 19.5 10*3/uL — ABNORMAL HIGH (ref 4.0–10.5)
nRBC: 0 % (ref 0.0–0.2)

## 2022-08-11 LAB — SARS CORONAVIRUS 2 BY RT PCR: SARS Coronavirus 2 by RT PCR: NEGATIVE

## 2022-08-11 LAB — URINALYSIS, ROUTINE W REFLEX MICROSCOPIC
Bacteria, UA: NONE SEEN
Bilirubin Urine: NEGATIVE
Glucose, UA: >=500 mg/dL — AB
Hgb urine dipstick: NEGATIVE
Ketones, ur: NEGATIVE mg/dL
Nitrite: NEGATIVE
Protein, ur: NEGATIVE mg/dL
Specific Gravity, Urine: 1.029 (ref 1.005–1.030)
pH: 6 (ref 5.0–8.0)

## 2022-08-11 LAB — HEPATITIS B SURFACE ANTIGEN: Hepatitis B Surface Ag: NONREACTIVE

## 2022-08-11 LAB — CBG MONITORING, ED
Glucose-Capillary: 109 mg/dL — ABNORMAL HIGH (ref 70–99)
Glucose-Capillary: 109 mg/dL — ABNORMAL HIGH (ref 70–99)
Glucose-Capillary: 138 mg/dL — ABNORMAL HIGH (ref 70–99)

## 2022-08-11 LAB — MAGNESIUM: Magnesium: 2.3 mg/dL (ref 1.7–2.4)

## 2022-08-11 LAB — HEPARIN LEVEL (UNFRACTIONATED)
Heparin Unfractionated: 0.6 IU/mL (ref 0.30–0.70)
Heparin Unfractionated: 0.53 IU/mL (ref 0.30–0.70)

## 2022-08-11 LAB — PHOSPHORUS: Phosphorus: 4.3 mg/dL (ref 2.5–4.6)

## 2022-08-11 LAB — PROCALCITONIN: Procalcitonin: <0.1 ng/mL

## 2022-08-11 LAB — STREP PNEUMONIAE URINARY ANTIGEN: Strep Pneumo Urinary Antigen: NEGATIVE

## 2022-08-11 MED ORDER — LIDOCAINE 5 % EX PTCH
1.0000 | MEDICATED_PATCH | CUTANEOUS | Status: DC
  Administered 2022-08-11 – 2022-08-14 (×5): 1 via TRANSDERMAL
  Filled 2022-08-11 (×5): qty 1

## 2022-08-11 MED ORDER — ACETAMINOPHEN 650 MG RE SUPP: 650.0000 mg | Freq: Four times a day (QID) | RECTAL | Status: DC | PRN

## 2022-08-11 MED ORDER — PANTOPRAZOLE SODIUM 40 MG PO TBEC
40.0000 mg | DELAYED_RELEASE_TABLET | Freq: Every day | ORAL | Status: DC
  Administered 2022-08-11 – 2022-08-12 (×2): 40 mg via ORAL
  Filled 2022-08-11 (×2): qty 1

## 2022-08-11 MED ORDER — ONDANSETRON HCL 4 MG/2ML IJ SOLN
4.0000 mg | Freq: Four times a day (QID) | INTRAMUSCULAR | Status: DC | PRN
  Administered 2022-08-11: 4 mg via INTRAVENOUS
  Filled 2022-08-11 (×2): qty 2

## 2022-08-11 MED ORDER — INSULIN ASPART 100 UNIT/ML IJ SOLN: 0.0000 [IU] | Freq: Every day | INTRAMUSCULAR | Status: DC

## 2022-08-11 MED ORDER — FUROSEMIDE 40 MG PO TABS
40.0000 mg | ORAL_TABLET | Freq: Every day | ORAL | Status: DC
  Administered 2022-08-11 – 2022-08-15 (×5): 40 mg via ORAL
  Filled 2022-08-11 (×5): qty 1

## 2022-08-11 MED ORDER — ASPIRIN 81 MG PO TBEC
81.0000 mg | DELAYED_RELEASE_TABLET | Freq: Every day | ORAL | Status: DC
  Administered 2022-08-11 – 2022-08-15 (×5): 81 mg via ORAL
  Filled 2022-08-11 (×5): qty 1

## 2022-08-11 MED ORDER — ATORVASTATIN CALCIUM 40 MG PO TABS
40.0000 mg | ORAL_TABLET | Freq: Every day | ORAL | Status: DC
  Administered 2022-08-11 – 2022-08-15 (×5): 40 mg via ORAL
  Filled 2022-08-11 (×6): qty 1

## 2022-08-11 MED ORDER — VANCOMYCIN HCL 750 MG/150ML IV SOLN
750.0000 mg | INTRAVENOUS | Status: DC
  Administered 2022-08-11 – 2022-08-12 (×2): 750 mg via INTRAVENOUS
  Filled 2022-08-11 (×3): qty 150

## 2022-08-11 MED ORDER — INFLUENZA VAC A&B SA ADJ QUAD 0.5 ML IM PRSY
0.5000 mL | PREFILLED_SYRINGE | INTRAMUSCULAR | Status: AC
  Administered 2022-08-12: 0.5 mL via INTRAMUSCULAR
  Filled 2022-08-11: qty 0.5

## 2022-08-11 MED ORDER — SODIUM CHLORIDE 0.9 % IV SOLN
2.0000 g | Freq: Two times a day (BID) | INTRAVENOUS | Status: DC
  Administered 2022-08-11 – 2022-08-15 (×9): 2 g via INTRAVENOUS
  Filled 2022-08-11 (×9): qty 12.5

## 2022-08-11 MED ORDER — MORPHINE SULFATE (PF) 2 MG/ML IV SOLN
2.0000 mg | INTRAVENOUS | Status: DC | PRN
  Administered 2022-08-11 – 2022-08-12 (×9): 2 mg via INTRAVENOUS
  Filled 2022-08-11 (×9): qty 1

## 2022-08-11 MED ORDER — CARVEDILOL 3.125 MG PO TABS
3.1250 mg | ORAL_TABLET | Freq: Two times a day (BID) | ORAL | Status: DC
  Administered 2022-08-11 – 2022-08-15 (×10): 3.125 mg via ORAL
  Filled 2022-08-11 (×10): qty 1

## 2022-08-11 MED ORDER — LOSARTAN POTASSIUM 50 MG PO TABS
25.0000 mg | ORAL_TABLET | Freq: Every day | ORAL | Status: DC
  Administered 2022-08-11 – 2022-08-15 (×5): 25 mg via ORAL
  Filled 2022-08-11 (×5): qty 1

## 2022-08-11 MED ORDER — DM-GUAIFENESIN ER 30-600 MG PO TB12
1.0000 | ORAL_TABLET | Freq: Two times a day (BID) | ORAL | Status: DC
  Administered 2022-08-11 – 2022-08-12 (×3): 1 via ORAL
  Filled 2022-08-11 (×4): qty 1

## 2022-08-11 MED ORDER — PNEUMOCOCCAL 20-VAL CONJ VACC 0.5 ML IM SUSY
0.5000 mL | PREFILLED_SYRINGE | INTRAMUSCULAR | Status: AC
  Administered 2022-08-12: 0.5 mL via INTRAMUSCULAR
  Filled 2022-08-11: qty 0.5

## 2022-08-11 MED ORDER — ONDANSETRON HCL 4 MG PO TABS: 4.0000 mg | ORAL_TABLET | Freq: Four times a day (QID) | ORAL | Status: DC | PRN

## 2022-08-11 MED ORDER — INSULIN ASPART 100 UNIT/ML IJ SOLN: 0.0000 [IU] | Freq: Three times a day (TID) | INTRAMUSCULAR | Status: DC

## 2022-08-11 MED ORDER — ACETAMINOPHEN 325 MG PO TABS
650.0000 mg | ORAL_TABLET | Freq: Four times a day (QID) | ORAL | Status: DC | PRN
  Administered 2022-08-12 – 2022-08-13 (×2): 650 mg via ORAL
  Filled 2022-08-11 (×2): qty 2

## 2022-08-11 NOTE — Progress Notes (Signed)
ANTICOAGULATION CONSULT NOTE -   Pharmacy Consult for Heparin  Indication: pulmonary embolus  No Known Allergies  Patient Measurements: Height: 5\' 4"  (162.6 cm) Weight: 65.3 kg (144 lb) IBW/kg (Calculated) : 54.7  Vital Signs: Temp: 98.1 F (36.7 C) (10/23 1320) Temp Source: Oral (10/23 1320) BP: 135/75 (10/23 1320) Pulse Rate: 96 (10/23 1320)  Labs: Recent Labs    08/10/22 1856 08/10/22 2153 08/11/22 0343 08/11/22 0906 08/11/22 1521  HGB 12.3  --  12.2  --   --   HCT 37.2  --  37.5  --   --   PLT 450*  --  398  --   --   LABPROT 14.3  --   --   --   --   INR 1.1  --   --   --   --   HEPARINUNFRC  --   --   --  0.60 0.53  CREATININE 1.20*  --  0.94  --   --   TROPONINIHS  --  14  --   --   --      Estimated Creatinine Clearance: 48.1 mL/min (by C-G formula based on SCr of 0.94 mg/dL).   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 Found to have new onset PE via CT angio. Starting heparin. Labs above reviewed. PTA meds reviewed.  HL 0.6> 0.53 , remains therapeutic  Goal of Therapy:  Heparin level 0.3-0.7 units/ml Monitor platelets by anticoagulation protocol: Yes   Plan:  Continue heparin drip at 1000 units/hr Daily CBC/Heparin level Monitor for bleeding  Isac Sarna, BS Pharm D, BCPS Clinical Pharmacist

## 2022-08-11 NOTE — ED Notes (Signed)
Pt given diet ginger ale at this time  

## 2022-08-11 NOTE — Progress Notes (Signed)
ANTICOAGULATION CONSULT NOTE -   Pharmacy Consult for Heparin  Indication: pulmonary embolus  No Known Allergies  Patient Measurements: Height: 5\' 4"  (162.6 cm) Weight: 65.3 kg (144 lb) IBW/kg (Calculated) : 54.7  Vital Signs: Temp: 98.1 F (36.7 C) (10/23 0816) Temp Source: Oral (10/23 0816) BP: 126/68 (10/23 0830) Pulse Rate: 95 (10/23 0830)  Labs: Recent Labs    08/10/22 1856 08/10/22 2153 08/11/22 0343 08/11/22 0906  HGB 12.3  --  12.2  --   HCT 37.2  --  37.5  --   PLT 450*  --  398  --   LABPROT 14.3  --   --   --   INR 1.1  --   --   --   HEPARINUNFRC  --   --   --  0.60  CREATININE 1.20*  --  0.94  --   TROPONINIHS  --  14  --   --      Estimated Creatinine Clearance: 48.1 mL/min (by C-G formula based on SCr of 0.94 mg/dL).   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 Found to have new onset PE via CT angio. Starting heparin. Labs above reviewed. PTA meds reviewed.  HL 0.6, therapeutic  Goal of Therapy:  Heparin level 0.3-0.7 units/ml Monitor platelets by anticoagulation protocol: Yes   Plan:  Continue heparin drip at 1000 units/hr 1500 Heparin level Daily CBC/Heparin level Monitor for bleeding  Isac Sarna, BS Pharm D, BCPS Clinical Pharmacist

## 2022-08-11 NOTE — Hospital Course (Signed)
71 year old female with a history of hypertension, hyperlipidemia, LBBB, systolic CHF, diabetes mellitus type 2 presenting with pleuritic left chest pain that began on 08/06/2022 and has progressively worsened over the past 3 days prior to admission.  Notably, the patient was recently hospitalized from 07/29/2022 to 08/04/2022.  During that hospital admission, the patient had respiratory failure and was on mechanical ventilation.  Her Respiratory failure was multifactorial including systolic CHF (EF <20%) and pneumonia.  She was initially treated with Zosyn and azithromycin.  She was discharged home with Augmentin.  Regarding her CHF, she was seen by cardiology, and the patient was started on carvedilol, Farxiga, spironolactone, and losartan. Since returning home, the patient states that she had remained somewhat short of breath but not any worsening.  She denies any worsening peripheral edema.  She denies any fevers, chills, nausea, vomiting or diarrhea abdominal pain.  She had a worsening cough without hemoptysis.  This resulted in her left pleuritic chest pain which prompted her return to the emergency department. In the ED, the patient had a low-grade temperature of 99.3.  She was initially tachycardic up to 130s.  She was hemodynamically stable.  Oxygen saturation is 98% on 2 L. CTA chest showed a small LUL pulmonary embolus.  There was RLL opacity with signs of early cavitation.  There is improving LLL infiltrate.  The patient was started on IV heparin.  WBC 20.0, hemoglobin 12.3, platelets 456,000.  Sodium 137, potassium 4.0, bicarbonate 25, serum creatinine 0.94.  UA showed 6-10 WBC.  Lactic acid 1.8>> 1.4.  EKG showed sinus tachycardia with left bundle branch block.

## 2022-08-11 NOTE — Progress Notes (Incomplete)
*  PRELIMINARY RESULTS* Echocardiogram Limited 2D Echocardiogram has been performed.  Summer Christensen 08/11/2022, 10:30 AM

## 2022-08-11 NOTE — Progress Notes (Signed)
ANTICOAGULATION CONSULT NOTE - Initial Consult  Pharmacy Consult for Heparin  Indication: pulmonary embolus  No Known Allergies  Patient Measurements: Height: 5\' 4"  (162.6 cm) Weight: 65.3 kg (144 lb) IBW/kg (Calculated) : 54.7  Vital Signs: Temp: 98.5 F (36.9 C) (10/23 0147) Temp Source: Oral (10/23 0147) BP: 129/73 (10/23 0130) Pulse Rate: 90 (10/23 0200)  Labs: Recent Labs    08/10/22 1856 08/10/22 2153  HGB 12.3  --   HCT 37.2  --   PLT 450*  --   LABPROT 14.3  --   INR 1.1  --   CREATININE 1.20*  --   TROPONINIHS  --  14    Estimated Creatinine Clearance: 37.7 mL/min (A) (by C-G formula based on SCr of 1.2 mg/dL (H)).   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 Found to have new onset PE via CT angio. Starting heparin. Labs above reviewed. PTA meds reviewed.   Goal of Therapy:  Heparin level 0.3-0.7 units/ml Monitor platelets by anticoagulation protocol: Yes   Plan:  Heparin 4000 units BOLUS Start heparin drip at 1000 units/hr 0800 Heparin level Daily CBC/Heparin level Monitor for bleeding  Narda Bonds, PharmD, BCPS Clinical Pharmacist Phone: [phone]

## 2022-08-11 NOTE — TOC Progression Note (Signed)
Transition of Care [facility]) - Progression Note    Patient Details  Name: ALIA PARSLEY MRN: [id number] Date of Birth: Nov 22, 1950  Transition of Care [facility]) CM/SW Contact  Salome Arnt, [place] Phone Number: 08/11/2022, 10:22 AM  Clinical Narrative:   Transition of Care (TOC) Screening Note   Patient Details  Name: BERNIS STECHER Date of Birth: 10/15/1951   Transition [facility] Care [facility]) CM/SW Contact:    Salome Arnt, [place] Phone Number: 08/11/2022, 10:22 AM    Transition of Care Department [facility]) has reviewed patient and no TOC needs have been identified at this time. We will continue to monitor patient advancement through interdisciplinary progression rounds. If new patient transition needs arise, please place a TOC consult.            Expected Discharge Plan and Services                                                 Social Determinants of Health (SDOH) Interventions    Readmission Risk Interventions    08/04/2022   10:51 AM  Readmission Risk Prevention Plan  Post Dischage Appt Complete  Medication Screening Complete  Transportation Screening Complete

## 2022-08-11 NOTE — ED Notes (Signed)
Tray brought to pt 

## 2022-08-11 NOTE — Progress Notes (Signed)
Pharmacy Antibiotic Note  Summer Christensen is a 71 y.o. female admitted on 08/10/2022 with pneumonia.  Pharmacy has been consulted for Vancomycin/Cefepime dosing. WBC elevated. Mild bump in Scr.   Plan: Vancomycin 750 mg IV q24h >>>Estimated AUC: 450 Cefepime 2g IV q12h Trend WBC, temp, renal function  F/U infectious work-up Drug levels as indicated   Height: 5\' 4"  (162.6 cm) Weight: 65.3 kg (144 lb) IBW/kg (Calculated) : 54.7  Temp (24hrs), Avg:98.8 F (37.1 C), Min:98.5 F (36.9 C), Max:99.3 F (37.4 C)  Recent Labs  Lab 08/04/22 0542 08/10/22 1856 08/10/22 1909 08/10/22 2155  WBC  --  20.0*  --   --   CREATININE 0.96 1.20*  --   --   LATICACIDVEN  --   --  1.8 1.4    Estimated Creatinine Clearance: 37.7 mL/min (A) (by C-G formula based on SCr of 1.2 mg/dL (H)).    No Known Allergies  Narda Bonds, PharmD, BCPS Clinical Pharmacist Phone: [phone]

## 2022-08-11 NOTE — Progress Notes (Addendum)
PROGRESS NOTE  Summer Christensen RUE:[id number] DOB: Feb 08, 1951 DOA: 08/10/2022 PCP: Smith Robert, MD  Brief History:  71 year old female with a history of hypertension, hyperlipidemia, LBBB, systolic CHF, diabetes mellitus type 2 presenting with pleuritic left chest pain that began on 08/06/2022 and has progressively worsened over the past 3 days prior to admission.  Notably, the patient was recently hospitalized from 07/29/2022 to 08/04/2022.  During that hospital admission, the patient had respiratory failure and was on mechanical ventilation.  Her Respiratory failure was multifactorial including systolic CHF (EF <14%) and pneumonia.  She was initially treated with Zosyn and azithromycin.  She was discharged home with Augmentin.  Regarding her CHF, she was seen by cardiology, and the patient was started on carvedilol, Farxiga, spironolactone, and losartan. Since returning home, the patient states that she had remained somewhat short of breath but not any worsening.  She denies any worsening peripheral edema.  She denies any fevers, chills, nausea, vomiting or diarrhea abdominal pain.  She had a worsening cough without hemoptysis.  This resulted in her left pleuritic chest pain which prompted her return to the emergency department. In the ED, the patient had a low-grade temperature of 99.3.  She was initially tachycardic up to 130s.  She was hemodynamically stable.  Oxygen saturation is 98% on 2 L. CTA chest showed a small LUL pulmonary embolus.  There was RLL opacity with signs of early cavitation.  There is improving LLL infiltrate.  The patient was started on IV heparin.  WBC 20.0, hemoglobin 12.3, platelets 456,000.  Sodium 137, potassium 4.0, bicarbonate 25, serum creatinine 0.94.  UA showed 6-10 WBC.  Lactic acid 1.8>> 1.4.  EKG showed sinus tachycardia with left bundle branch block.   Assessment/Plan: Acute pulmonary embolus -CTA chest as described above -Limited echo -Continue IV  heparin with plans to transition to apixaban in the next 24 hours -Continue IV morphine every 3 hours as needed for pleuritic chest pain  Lobar pneumonia/necrotizing pneumonia -08/10/2022 CTA chest--POS PE, RLL opacity with early cavitation -Continue vancomycin and cefepime -07/29/2022 CTA chest--bilateral LL consolidation, L>R; bilateral centrilobular GGO -Check PCT  Pleuritic chest pain -Morphine as needed as discussed above  Chronic HFrEF -07/29/2022 echo EF <20%, global HK, grade 3 DD, mild to moderate MR -Continue home dose furosemide -Continue carvedilol, losartan, spironolactone, Jardiance  Impaired glucose tolerance -07/29/2022 hemoglobin A1c 6.3 -Lifestyle modification  Aortic atherosclerosis -hold statin due to elevated LFTs  Transaminasemia -likely due to hemodynamic changes -RUQ [place] -no abd pain -trend LFTs --check COVID -19   Family Communication:   no Family at bedside  Consultants:  none  Code Status:  FULL   DVT Prophylaxis:  IV Heparin    Procedures: As Listed in Progress Note Above  Antibiotics: Vanc 10/22>> Cefepime 10/22>>     Subjective: Patient complains of some shortness of breath and pleuritic chest pain on the left.  She denies any hemoptysis.  There is no nausea, vomiting, diarrhea, abdominal pain.  There is no hematochezia or melena.  Objective: Vitals:   08/11/22 0545 08/11/22 0600 08/11/22 0606 08/11/22 0630  BP:  127/66  127/62  Pulse: 96 95 93 92  Resp: (!) 24  Temp:      TempSrc:      SpO2: 98% 97% 99% 100%  Weight:      Height:        Intake/Output Summary (Last 24 hours) at 08/11/2022 0802 Last data filed  at 08/11/2022 0023 Gross per 24 hour  Intake 800 ml  Output --  Net 800 ml   Weight change:  Exam:  General:  Pt is alert, follows commands appropriately, not in acute distress HEENT: No icterus, No thrush, No neck mass, [place]/AT Cardiovascular: RRR, S1/S2, no rubs, no gallops Respiratory:  Diminished breath sounds bilateral.  Bibasilar rales.  No wheezing. Abdomen: Soft/+BS, non tender, non distended, no guarding Extremities: No edema, No lymphangitis, No petechiae, No rashes, no synovitis   Data Reviewed: I have personally reviewed following labs and imaging studies Basic Metabolic Panel: Recent Labs  Lab 08/10/22 1856 08/11/22 0343  NA 136 137  K 3.7 4.0  CL 102 102  CO2 24 25  GLUCOSE 151* 141*  BUN 19 18  CREATININE 1.20* 0.94  CALCIUM 9.9 9.4  MG  --  2.3  PHOS  --  4.3   Liver Function Tests: Recent Labs  Lab 08/10/22 1856 08/11/22 0343  AST 20 247*  ALT 33 107*  ALKPHOS 90 126  BILITOT 0.5 0.8  PROT 8.4* 7.0  ALBUMIN 3.9 3.3*   No results for input(s): "LIPASE", "AMYLASE" in the last 168 hours. No results for input(s): "AMMONIA" in the last 168 hours. Coagulation Profile: Recent Labs  Lab 08/10/22 1856  INR 1.1   CBC: Recent Labs  Lab 08/10/22 1856 08/11/22 0343  WBC 20.0* 19.5*  NEUTROABS 15.9*  --   HGB 12.3 12.2  HCT 37.2 37.5  MCV 84.9 86.2  PLT 450* 398   Cardiac Enzymes: No results for input(s): "CKTOTAL", "CKMB", "CKMBINDEX", "TROPONINI" in the last 168 hours. BNP: Invalid input(s): "POCBNP" CBG: Recent Labs  Lab 08/11/22 0028 08/11/22 0721  GLUCAP 138* 109*   HbA1C: No results for input(s): "HGBA1C" in the last 72 hours. Urine analysis:    Component Value Date/Time   COLORURINE YELLOW 08/11/2022 0545   APPEARANCEUR CLEAR 08/11/2022 0545   APPEARANCEUR Clear 11/20/2017 1200   LABSPEC 1.029 08/11/2022 0545   PHURINE 6.0 08/11/2022 0545   GLUCOSEU >=500 (A) 08/11/2022 0545   HGBUR NEGATIVE 08/11/2022 0545   BILIRUBINUR NEGATIVE 08/11/2022 0545   BILIRUBINUR Negative 11/20/2017 1200   KETONESUR NEGATIVE 08/11/2022 0545   PROTEINUR NEGATIVE 08/11/2022 0545   UROBILINOGEN 0.2 11/14/2010 1850   NITRITE NEGATIVE 08/11/2022 0545   LEUKOCYTESUR TRACE (A) 08/11/2022 0545   Sepsis  Labs: @LABRCNTIP (procalcitonin:4,lacticidven:4) ) Recent Results (from the past 240 hour(s))  Culture, blood (Routine x 2)     Status: None (Preliminary result)   Collection Time: 08/10/22  6:57 PM   Specimen: Left Antecubital; Blood  Result Value Ref Range Status   Specimen Description LEFT ANTECUBITAL  Final   Special Requests   Final    BOTTLES DRAWN AEROBIC AND ANAEROBIC Blood Culture adequate volume   Culture   Final    NO GROWTH < 12 HOURS Performed at [facility], [address] [place], [place]    Report Status PENDING  Incomplete  Culture, blood (Routine x 2)     Status: None (Preliminary result)   Collection Time: 08/10/22  7:09 PM   Specimen: BLOOD RIGHT FOREARM  Result Value Ref Range Status   Specimen Description BLOOD RIGHT FOREARM  Final   Special Requests   Final    BOTTLES DRAWN AEROBIC AND ANAEROBIC Blood Culture adequate volume   Culture   Final    NO GROWTH < 12 HOURS Performed at [facility], [address], [place], [place]    Report  Status PENDING  Incomplete     Scheduled Meds:  aspirin EC  81 mg Oral Daily   atorvastatin  40 mg Oral Daily   carvedilol  3.125 mg Oral BID WC   dextromethorphan-guaiFENesin  1 tablet Oral BID   furosemide  40 mg Oral Daily   lidocaine  1 patch Transdermal Q24H   losartan  25 mg Oral Daily   pantoprazole  40 mg Oral Daily   Continuous Infusions:  ceFEPime (MAXIPIME) IV     heparin 1,000 Units/hr (08/11/22 0024)   vancomycin      Procedures/Studies: CT Angio Chest PE W/Cm &/Or Wo Cm  Result Date: 08/10/2022 CLINICAL DATA:  Concern for pulmonary embolism. EXAM: CT ANGIOGRAPHY CHEST WITH CONTRAST TECHNIQUE: Multidetector CT imaging of the chest was performed using the standard protocol during bolus administration of intravenous contrast. Multiplanar CT image reconstructions and MIPs were obtained to evaluate the vascular anatomy. RADIATION DOSE REDUCTION: This exam was performed according to the  departmental dose-optimization program which includes automated exposure control, adjustment of the mA and/or kV according to patient size and/or use of iterative reconstruction technique. CONTRAST:  75mL OMNIPAQUE IOHEXOL 350 MG/ML SOLN COMPARISON:  Chest radiograph dated 08/10/2022 and CT dated 07/29/2022. FINDINGS: Evaluation of this exam is limited due to respiratory motion artifact. Cardiovascular: There is mild cardiomegaly with mild dilatation of the left cardiac chambers. No pericardial effusion. Mild atherosclerotic calcification of the thoracic aorta. No aneurysmal dilatation or dissection. Evaluation of the pulmonary arteries is limited due to respiratory motion. Small left upper lobe segmental pulmonary artery emboli noted. No CT evidence of right heart straining. Mediastinum/Nodes: No hilar or mediastinal adenopathy. The esophagus is grossly unremarkable. No mediastinal fluid collection. Lungs/Pleura: Small left pleural effusion. There is left lung base partial compressive atelectasis or infiltrate. Overall improved aeration of the left lower lobe compared to prior CT. Clusters of airspace opacity in the right lower lobe most consistent with pneumonia. There may be areas of early cavitary change at the right lung base. Attention on follow-up imaging recommended. Small subpleural nodule in the superior segment of the left lower lobe, likely post inflammatory and residual from prior infiltration. There is a 5 mm nodule or para fissural lymph node along the left fissure (71/6). There is no pneumothorax. The central airways are patent. Upper Abdomen: Cholecystectomy. Musculoskeletal: Osteopenia with degenerative changes of the spine. No acute osseous pathology. Review of the MIP images confirms the above findings. IMPRESSION: 1. Small left upper lobe segmental pulmonary artery emboli. No CT evidence of right heart straining. 2. Right lower lobe pneumonia. There may be areas of early cavitary change at the  right lung base. Attention on follow-up imaging recommended. 3. Small left pleural effusion with left lung base partial compressive atelectasis or infiltrate. Overall improved aeration of the left lower lobe compared to prior CT. 4. Mild cardiomegaly with mild dilatation of the left cardiac chambers. 5. A 5 mm nodule or para fissural lymph node along the left fissure. 6.  Aortic Atherosclerosis (ICD10-I70.0). These results were called by telephone at the time of interpretation on 08/10/2022 at 10:43 pm to provider Alvino Blood , who verbally acknowledged these results. Electronically Signed   By: Elgie Collard M.D.   On: 08/10/2022 22:44   DG Chest 2 View  Result Date: 08/10/2022 CLINICAL DATA:  Suspected Sepsis EXAM: CHEST - 2 VIEW COMPARISON:  Chest x-ray 08/01/2022, CT chest 07/29/2022 FINDINGS: The heart and mediastinal contours are unchanged. Question right lower lung zone airspace opacity  versus hilar lymph node. Retrocardiac airspace opacity. No pulmonary edema. Small left pleural effusion. No pneumothorax. No acute osseous abnormality. IMPRESSION: 1. Small left pleural effusion. 2. Retrocardiac airspace opacity may represent a combination of infection/inflammation versus atelectasis. 3. Question right lower lung zone airspace opacity versus hilar lymph node. Electronically Signed   By: Tish Frederickson M.D.   On: 08/10/2022 19:10   DG Chest Port 1 View  Result Date: 08/01/2022 CLINICAL DATA:  Fever EXAM: PORTABLE CHEST 1 VIEW COMPARISON:  Chest radiograph 07/31/2022 FINDINGS: No pleural effusion. No pneumothorax. Normal lung volumes. Unchanged cardiac and mediastinal contours. Compared to prior exam there is slight interval increase in conspicuity of a retrocardiac opacity, which could represent atelectasis, but infection is not excluded. No displaced rib fractures. The visualized upper abdomen is unremarkable. IMPRESSION: Compared to prior exam there is slight interval increase in  conspicuity of a retrocardiac opacity, which could represent atelectasis, but infection is not excluded. Electronically Signed   By: Lorenza Cambridge M.D.   On: 08/01/2022 09:49   DG Chest Port 1 View  Result Date: 07/31/2022 CLINICAL DATA:  Bibasilar crackles. EXAM: PORTABLE CHEST 1 VIEW COMPARISON:  07/31/2022 FINDINGS: Interval removal of enteric and endotracheal tubes. Mild cardiac enlargement. No vascular congestion or edema. Lung bases and costophrenic angles are clear. No pneumothorax. IMPRESSION: Cardiac enlargement.  Lungs appear clear postextubation. Electronically Signed   By: Burman Nieves M.D.   On: 07/31/2022 23:34   DG Chest Port 1 View  Result Date: 07/31/2022 CLINICAL DATA:  Acute respiratory failure EXAM: PORTABLE CHEST 1 VIEW COMPARISON:  Chest 07/30/2022 FINDINGS: Endotracheal tube 2.5 cm above the carina. NG tube in the stomach with the tip not visualized. Left lower lobe consolidation with mild progression. Mild right lower lobe airspace disease with progression. IMPRESSION: 1. Endotracheal tube in good position. 2. Progression of bibasilar airspace disease left greater than right. Electronically Signed   By: Marlan Palau M.D.   On: 07/31/2022 14:01   DG Chest Port 1 View  Result Date: 07/30/2022 CLINICAL DATA:  Acute on chronic respiratory failure with hypoxemia. EXAM: PORTABLE CHEST 1 VIEW COMPARISON:  07/29/2022 FINDINGS: ET tube tip is above the carina. Enteric tube tip courses below the level of the hemidiaphragms. Unchanged asymmetric elevation of the right hemidiaphragm. Persistent airspace disease within the left lower lobe which appears unchanged from the previous exam. Decreased interstitial edema pattern. IMPRESSION: 1. Stable support apparatus. 2. Persistent left lower lobe airspace disease. 3. Decreased interstitial edema pattern. Electronically Signed   By: Signa Kell M.D.   On: 07/30/2022 08:54   ECHOCARDIOGRAM COMPLETE  Result Date: 07/29/2022     ECHOCARDIOGRAM REPORT   Patient Name:   JAYDYNN WOLFORD Pidcock Date of Exam: 07/29/2022 Medical Rec #:  [id number]     Height:       64.0 in Accession #:    [id number]    Weight:       145.7 lb Date of Birth:  1951-08-29     BSA:          1.710 m Patient Age:    70 years      BP:           102/69 mmHg Patient Gender: F             HR:           95 bpm. [facility] Procedure: 2D Echo, Color Doppler, Cardiac Doppler and Intracardiac  Opacification Agent Indications:    W09.81 Acute systolic (congestive) heart failure  History:        Patient has prior history of Echocardiogram examinations, most                 recent 01/08/2011. Arrythmias:LBBB; Risk Factors:Hypertension and                 Diabetes.  Sonographer:    Raquel Sarna Senior RDCS Referring Phys: [id number] OLADAPO ADEFESO  Sonographer Comments: Scanned supine on artificial respirator. IMPRESSIONS  1. Left ventricular ejection fraction, by estimation, is <20%. The left ventricle has severely decreased function. The left ventricle demonstrates global hypokinesis. The left ventricular internal cavity size was moderately dilated. Left ventricular diastolic parameters are consistent with Grade III diastolic dysfunction (restrictive).  2. Right ventricular systolic function is normal. The right ventricular size is normal.  3. The mitral valve is normal in structure. Mild to moderate mitral valve regurgitation. No evidence of mitral stenosis.  4. The aortic valve is tricuspid. Aortic valve regurgitation is not visualized. No aortic stenosis is present.  5. The inferior vena cava is normal in size with greater than 50% respiratory variability, suggesting right atrial pressure of 3 mmHg. FINDINGS  Left Ventricle: Left ventricular ejection fraction, by estimation, is <20%. The left ventricle has severely decreased function. The left ventricle demonstrates global hypokinesis. Definity contrast agent was given IV to delineate the left ventricular endocardial  borders. The left ventricular internal cavity size was moderately dilated. There is no left ventricular hypertrophy. Abnormal (paradoxical) septal motion, consistent with left bundle branch block. Left ventricular diastolic parameters are consistent with Grade III diastolic dysfunction (restrictive). Right Ventricle: The right ventricular size is normal. No increase in right ventricular wall thickness. Right ventricular systolic function is normal. Left Atrium: Left atrial size was normal in size. Right Atrium: Right atrial size was normal in size. Pericardium: There is no evidence of pericardial effusion. Mitral Valve: The mitral valve is normal in structure. Mild to moderate mitral valve regurgitation. No evidence of mitral valve stenosis. Tricuspid Valve: The tricuspid valve is normal in structure. Tricuspid valve regurgitation is not demonstrated. No evidence of tricuspid stenosis. Aortic Valve: The aortic valve is tricuspid. Aortic valve regurgitation is not visualized. No aortic stenosis is present. Pulmonic Valve: The pulmonic valve was normal in structure. Pulmonic valve regurgitation is mild. No evidence of pulmonic stenosis. Aorta: The aortic root is normal in size and structure. Venous: The inferior vena cava is normal in size with greater than 50% respiratory variability, suggesting right atrial pressure of 3 mmHg. IAS/Shunts: No atrial level shunt detected by color flow Doppler.  LEFT VENTRICLE PLAX 2D LVIDd:         5.90 cm      Diastology LVIDs:         4.50 cm      LV e' medial:    4.90 cm/s LV PW:         1.00 cm      LV E/e' medial:  20.8 LV IVS:        0.90 cm      LV e' lateral:   7.94 cm/s LVOT diam:     1.90 cm      LV E/e' lateral: 12.8 LV SV:         30 LV SV Index:   18 LVOT Area:     2.84 cm  LV Volumes (MOD) LV vol d, MOD A2C: 131.0 ml LV vol d, MOD  A4C: 114.0 ml LV vol s, MOD A2C: 114.0 ml LV vol s, MOD A4C: 102.0 ml LV SV MOD A2C:     17.0 ml LV SV MOD A4C:     114.0 ml LV SV MOD BP:       14.8 ml RIGHT VENTRICLE RV S prime:     9.03 cm/s TAPSE (M-mode): 1.9 cm LEFT ATRIUM             Index        RIGHT ATRIUM           Index LA diam:        4.00 cm 2.34 cm/m   RA Area:     12.70 cm LA Vol (A2C):   56.3 ml 32.92 ml/m  RA Volume:   26.30 ml  15.38 ml/m LA Vol (A4C):   39.7 ml 23.22 ml/m LA Biplane Vol: 48.8 ml 28.54 ml/m  AORTIC VALVE LVOT Vmax:   92.70 cm/s LVOT Vmean:  62.300 cm/s LVOT VTI:    0.106 m  AORTA Ao Root diam: 3.00 cm Ao Asc diam:  3.30 cm MITRAL VALVE MV Area (PHT): 5.50 cm     SHUNTS MV Decel Time: 138 msec     Systemic VTI:  0.11 m MV E velocity: 102.00 cm/s  Systemic Diam: 1.90 cm MV A velocity: 30.00 cm/s MV E/A ratio:  3.40 Donato Schultz MD Electronically signed by Donato Schultz MD Signature Date/Time: 07/29/2022/10:33:16 AM    Final    DG Chest Portable 1 View  Result Date: 07/29/2022 CLINICAL DATA:  NG tube placement. EXAM: PORTABLE CHEST 1 VIEW COMPARISON:  07/29/2022 FINDINGS: 0451 hours. Endotracheal tube tip is 1.3 cm above the base of the carina. The NG tube passes into the stomach although the distal tip position is not included on the film. The cardio pericardial silhouette is enlarged. Vascular congestion noted with underlying chronic interstitial coarsening. Mild bibasilar atelectasis noted without substantial pleural effusion. Telemetry leads overlie the chest. IMPRESSION: Endotracheal tube tip is 1.3 cm above the base of the carina. NG tube passes into the stomach. Cardiomegaly with vascular congestion and basilar atelectasis. Electronically Signed   By: Kennith Center M.D.   On: 07/29/2022 05:30   CT Angio Chest/Abd/Pel for Dissection W and/or Wo Contrast  Addendum Date: 07/29/2022   ADDENDUM REPORT: 07/29/2022 05:09 ADDENDUM: ETT position and other salient findings discussed by telephone with Dr. Geoffery Lyons on 07/29/2022 at 0501 hours. Electronically Signed   By: Odessa Fleming M.D.   On: 07/29/2022 05:09   Result Date: 07/29/2022 CLINICAL DATA:   71 year old female unresponsive at home. Altered mental status. Intubated. EXAM: CT ANGIOGRAPHY CHEST, ABDOMEN AND PELVIS TECHNIQUE: Non-contrast CT of the chest was initially obtained. Multidetector CT imaging through the chest, abdomen and pelvis was performed using the standard protocol during bolus administration of intravenous contrast. Multiplanar reconstructed images and MIPs were obtained and reviewed to evaluate the vascular anatomy. RADIATION DOSE REDUCTION: This exam was performed according to the departmental dose-optimization program which includes automated exposure control, adjustment of the mA and/or kV according to patient size and/or use of iterative reconstruction technique. CONTRAST:  OMNIPAQUE IOHEXOL 350 MG/ML SOLN COMPARISON:  Portable chest 0347 hours today. CTA chest 01/06/2011. CT Abdomen and Pelvis 12/25/2021. FINDINGS: CTA CHEST FINDINGS Cardiovascular: Calcified aortic atherosclerosis. Calcified coronary artery atherosclerosis. Mild cardiomegaly appears stable since March. No pericardial effusion. Following contrast there is no thoracic aorta dissection. Incidental cardiac pulsation artifact. No thoracic aortic aneurysm. Also, the central  pulmonary arteries are enhancing and appear to be patent. Mediastinum/Nodes: Negative. Lungs/Pleura: Intubated, endotracheal tube tip terminates just at the right mainstem bronchus on series 10, image 72. Atelectatic changes to the airways at the carina. Dependent, confluent pulmonary consolidation in both lower lobes, greater on the left. Early dependent upper lobe involvement. Small pulmonary arterial branches appear to be enhancing in the areas of consolidation. Superimposed perihilar and centrilobular pulmonary ground-glass opacity in both lungs with some septal thickening. Trace layering pleural effusions. Musculoskeletal: Respiratory motion at the sternum and ribs. No acute osseous abnormality identified. Review of the MIP images confirms  the above findings. CTA ABDOMEN AND PELVIS FINDINGS VASCULAR Mild Aortoiliac calcified atherosclerosis. Negative for abdominal aortic aneurysm or dissection. Major arterial structures are patent. Portal venous system also appears to be patent. Review of the MIP images confirms the above findings. NON-VASCULAR Hepatobiliary: Chronically absent gallbladder. Liver appears stable and negative when allowing for motion artifact. Pancreas: Stable, negative. Spleen: Within normal limits. Adrenals/Urinary Tract: Stable, negative adrenal glands. Asymmetric right renal cortical scarring and volume loss. Symmetric renal enhancement. Motion artifact. No convincing hydronephrosis or pararenal inflammation. Ureters appear decompressed. No obvious urinary calculus. Unremarkable bladder. Stomach/Bowel: Intermittent motion artifact. Redundant but decompressed large bowel except for retained stool in the transverse colon. Normal retrocecal appendix on series 6, image 152. No dilated small bowel. Small volume of air in fluid in the stomach. Negative duodenum. No free air, free fluid, or mesenteric inflammation identified. Lymphatic: No lymphadenopathy. Reproductive: Chronically absent uterus, diminutive or absent ovaries. Other: No pelvic free fluid. Musculoskeletal: No acute osseous abnormality identified. Review of the MIP images confirms the above findings. IMPRESSION: 1. Intubated, endotracheal tube tip terminates just inside the right mainstem bronchus. Recommend retracting 1 cm. 2. Negative for aortic dissection or aneurysm. Aortic Atherosclerosis (ICD10-I70.0). 3. Positive for dependent consolidation in both lungs, especially lower lobes greater on the left. This is most compatible with Pneumonia Versus Aspiration. 4. Trace layering pleural effusions and additional central pulmonary ground-glass opacity with septal thickening raising the possibility of mild or developing interstitial edema. Stable cardiomegaly. No pericardial  effusion. 5. No acute or inflammatory process identified in the abdomen or pelvis. Chronic right renal cortical scarring. Electronically Signed: By: Odessa Fleming M.D. On: 07/29/2022 04:57   CT Head Wo Contrast  Result Date: 07/29/2022 CLINICAL DATA:  71 year old female unresponsive at home. Altered mental status. Intubated. EXAM: CT HEAD WITHOUT CONTRAST TECHNIQUE: Contiguous axial images were obtained from the base of the skull through the vertex without intravenous contrast. RADIATION DOSE REDUCTION: This exam was performed according to the departmental dose-optimization program which includes automated exposure control, adjustment of the mA and/or kV according to patient size and/or use of iterative reconstruction technique. COMPARISON:  Head CT 08/03/2009. FINDINGS: Brain: Cerebral volume is within normal limits for age. No midline shift, ventriculomegaly, mass effect, evidence of mass lesion, intracranial hemorrhage or evidence of cortically based acute infarction. Gray-white matter differentiation is within normal limits for age. No convincing encephalomalacia. Vascular: Calcified atherosclerosis at the skull base. No suspicious intracranial vascular hyperdensity. Skull: Stable.  No acute osseous abnormality identified. Sinuses/Orbits: Visualized paranasal sinuses and mastoids are stable and well aerated. Other: Visualized orbits and scalp soft tissues are within normal limits. IMPRESSION: Normal for age non contrast CT appearance of the brain, head. Electronically Signed   By: Odessa Fleming M.D.   On: 07/29/2022 04:48   DG Chest Portable 1 View  Result Date: 07/29/2022 CLINICAL DATA:  71 year old female with shortness of  breath. EXAM: PORTABLE CHEST 1 VIEW COMPARISON:  Chest radiographs 12/31/2011 and earlier. CT Abdomen and Pelvis 12/25/2021. FINDINGS: Portable AP semi upright view at 0347 hours. Cardiomegaly, new since 2013 but appears stable from the CT Abdomen and Pelvis this year. Other mediastinal  contours are within normal limits. Visualized tracheal air column is within normal limits. Lung volumes are within normal limits. No pneumothorax, pleural effusion or consolidation. Mild streaky bilateral perihilar opacity. Increased pulmonary vascularity from prior radiographs, but no overt edema. No acute osseous abnormality identified. Paucity of bowel gas in the upper abdomen. IMPRESSION: 1. Cardiomegaly with vascular congestion but no overt edema. 2. Streaky bilateral perihilar opacity, nonspecific but favor atelectasis. Electronically Signed   By: Odessa Fleming M.D.   On: 07/29/2022 04:00    Catarina Hartshorn, DO  [facility]  If 7PM-7AM, please contact night-coverage [email or web address] Password [email or web address] 08/11/2022, 8:02 AM   LOS: 1 day

## 2022-08-12 DIAGNOSIS — I10 Essential (primary) hypertension: Secondary | ICD-10-CM | POA: Diagnosis not present

## 2022-08-12 DIAGNOSIS — J850 Gangrene and necrosis of lung: Secondary | ICD-10-CM

## 2022-08-12 DIAGNOSIS — I5022 Chronic systolic (congestive) heart failure: Secondary | ICD-10-CM | POA: Diagnosis not present

## 2022-08-12 DIAGNOSIS — J9611 Chronic respiratory failure with hypoxia: Secondary | ICD-10-CM | POA: Diagnosis not present

## 2022-08-12 DIAGNOSIS — J189 Pneumonia, unspecified organism: Secondary | ICD-10-CM | POA: Diagnosis not present

## 2022-08-12 DIAGNOSIS — J9601 Acute respiratory failure with hypoxia: Secondary | ICD-10-CM | POA: Diagnosis not present

## 2022-08-12 DIAGNOSIS — I2699 Other pulmonary embolism without acute cor pulmonale: Secondary | ICD-10-CM | POA: Diagnosis not present

## 2022-08-12 LAB — CBC
HCT: 33 % — ABNORMAL LOW (ref 36.0–46.0)
Hemoglobin: 10.6 g/dL — ABNORMAL LOW (ref 12.0–15.0)
MCH: 28 pg (ref 26.0–34.0)
MCHC: 32.1 g/dL (ref 30.0–36.0)
MCV: 87.3 fL (ref 80.0–100.0)
Platelets: 398 10*3/uL (ref 150–400)
RBC: 3.78 MIL/uL — ABNORMAL LOW (ref 3.87–5.11)
RDW: 12.8 % (ref 11.5–15.5)
WBC: 22.2 10*3/uL — ABNORMAL HIGH (ref 4.0–10.5)
nRBC: 0 % (ref 0.0–0.2)

## 2022-08-12 LAB — HEPARIN LEVEL (UNFRACTIONATED): Heparin Unfractionated: 0.35 IU/mL (ref 0.30–0.70)

## 2022-08-12 LAB — COMPREHENSIVE METABOLIC PANEL
ALT: 101 U/L — ABNORMAL HIGH (ref 0–44)
AST: 74 U/L — ABNORMAL HIGH (ref 15–41)
Albumin: 3 g/dL — ABNORMAL LOW (ref 3.5–5.0)
Alkaline Phosphatase: 130 U/L — ABNORMAL HIGH (ref 38–126)
Anion gap: 9 (ref 5–15)
BUN: 23 mg/dL (ref 8–23)
CO2: 25 mmol/L (ref 22–32)
Calcium: 9.7 mg/dL (ref 8.9–10.3)
Chloride: 103 mmol/L (ref 98–111)
Creatinine, Ser: 1.31 mg/dL — ABNORMAL HIGH (ref 0.44–1.00)
GFR, Estimated: 44 mL/min — ABNORMAL LOW (ref >60)
Glucose, Bld: 132 mg/dL — ABNORMAL HIGH (ref 70–99)
Potassium: 4.5 mmol/L (ref 3.5–5.1)
Sodium: 137 mmol/L (ref 135–145)
Total Bilirubin: 0.7 mg/dL (ref 0.3–1.2)
Total Protein: 6.8 g/dL (ref 6.5–8.1)

## 2022-08-12 LAB — LEGIONELLA PNEUMOPHILA SEROGP 1 UR AG: L. pneumophila Serogp 1 Ur Ag: NEGATIVE

## 2022-08-12 LAB — MRSA NEXT GEN BY PCR, NASAL: MRSA by PCR Next Gen: NOT DETECTED

## 2022-08-12 MED ORDER — ADULT MULTIVITAMIN W/MINERALS CH
1.0000 | ORAL_TABLET | Freq: Every day | ORAL | Status: DC
  Administered 2022-08-12 – 2022-08-15 (×4): 1 via ORAL
  Filled 2022-08-12 (×5): qty 1

## 2022-08-12 MED ORDER — DM-GUAIFENESIN ER 30-600 MG PO TB12
2.0000 | ORAL_TABLET | Freq: Two times a day (BID) | ORAL | Status: DC
  Administered 2022-08-12 – 2022-08-15 (×6): 2 via ORAL
  Filled 2022-08-12 (×2): qty 1
  Filled 2022-08-12 (×4): qty 2

## 2022-08-12 MED ORDER — HYDROMORPHONE HCL 1 MG/ML IJ SOLN
0.5000 mg | INTRAMUSCULAR | Status: DC | PRN
  Administered 2022-08-12 – 2022-08-13 (×6): 0.5 mg via INTRAVENOUS
  Filled 2022-08-12 (×6): qty 0.5

## 2022-08-12 MED ORDER — ENSURE ENLIVE PO LIQD
237.0000 mL | Freq: Three times a day (TID) | ORAL | Status: DC
  Administered 2022-08-12 – 2022-08-15 (×7): 237 mL via ORAL

## 2022-08-12 MED ORDER — PANTOPRAZOLE SODIUM 40 MG PO TBEC
40.0000 mg | DELAYED_RELEASE_TABLET | Freq: Two times a day (BID) | ORAL | Status: DC
  Administered 2022-08-12 – 2022-08-15 (×7): 40 mg via ORAL
  Filled 2022-08-12 (×7): qty 1

## 2022-08-12 MED ORDER — HYDROCODONE BIT-HOMATROP MBR 5-1.5 MG/5ML PO SOLN
5.0000 mL | ORAL | Status: DC | PRN
  Administered 2022-08-12 – 2022-08-14 (×2): 5 mL via ORAL
  Filled 2022-08-12 (×2): qty 5

## 2022-08-12 MED ORDER — EMPAGLIFLOZIN 10 MG PO TABS
10.0000 mg | ORAL_TABLET | Freq: Every day | ORAL | Status: DC
  Administered 2022-08-12 – 2022-08-15 (×4): 10 mg via ORAL
  Filled 2022-08-12 (×4): qty 1

## 2022-08-12 MED ORDER — GABAPENTIN 100 MG PO CAPS
100.0000 mg | ORAL_CAPSULE | Freq: Three times a day (TID) | ORAL | Status: DC
  Administered 2022-08-12 – 2022-08-14 (×6): 100 mg via ORAL
  Filled 2022-08-12 (×6): qty 1

## 2022-08-12 NOTE — Progress Notes (Signed)
Initial Nutrition Assessment  DOCUMENTATION CODES:      INTERVENTION:  Ensure Enlive po BID   Multivitamin daily  Liberalize diet   NUTRITION DIAGNOSIS:   Inadequate oral intake related to acute illness as evidenced by per patient/family report, meal completion < 50%.   GOAL:  Patient will meet greater than or equal to 90% of their needs    MONITOR:  PO intake, Supplement acceptance, Labs, Weight trends  REASON FOR ASSESSMENT:   Malnutrition Screening Tool    ASSESSMENT: Patient is a 71 yo female with hx of CHF, DM2, HTN, HLD who presents with cough and pneumonia. Recent hospitalized due to acute respiratory failure.   Multiple family members bedside. Patient is up in chair. She was able to eat a little breakfast and lunch today (25%). Patient says she has no appetite and even at home  was not eating much.   Patient weight is suprisingly stable at 63-66 kg the past 2 years. At risk for malnutrition given her recent hospitalization, poor appetite and ongoing acute illness.  Medications reviewed and include:  lipitor, lasix, protonix      Latest Ref Rng & Units 08/12/2022    5:15 AM 08/11/2022    3:43 AM 08/10/2022    6:56 PM  BMP  Glucose 70 - 99 mg/dL 132  141  151   BUN 8 - 23 mg/dL 23  18  19    Creatinine 0.44 - 1.00 mg/dL 1.31  0.94  1.20   Sodium 135 - 145 mmol/L 137  137  136   Potassium 3.5 - 5.1 mmol/L 4.5  4.0  3.7   Chloride 98 - 111 mmol/L 103  102  102   CO2 22 - 32 mmol/L 25  25  24    Calcium 8.9 - 10.3 mg/dL 9.7  9.4  9.9     Diet Order:   Diet Order             Diet heart healthy/carb modified Room service appropriate? Yes; Fluid consistency: Thin  Diet effective now                   EDUCATION NEEDS:  Not appropriate for education at this time  Skin:  Skin Assessment: Reviewed RN Assessment  Last BM:  10/22  Height:   Ht Readings from Last 1 Encounters:  08/10/22 5\' 4"  (1.626 m)    Weight:   Wt Readings from Last 1  Encounters:  08/10/22 65.3 kg    Ideal Body Weight:   55 kg  BMI:  Body mass index is 24.72 kg/m.  Estimated Nutritional Needs:   Kcal:  1750-1900  Protein:  85-90 gr  Fluid:  < 2 liters daily  Colman Cater MS,RD,CSG,LDN Contact: Shea Evans

## 2022-08-12 NOTE — Progress Notes (Signed)
PROGRESS NOTE  IXAYANA DRANSFIELD PQD:[id number] DOB: [birth year] DOA: 08/10/2022 PCP: Smith Robert, MD  Brief History:  71 year old female with a history of hypertension, hyperlipidemia, LBBB, systolic CHF, diabetes mellitus type 2 presenting with pleuritic left chest pain that began on 08/06/2022 and has progressively worsened over the past 3 days prior to admission.  Notably, the patient was recently hospitalized from 07/29/2022 to 08/04/2022.  During that hospital admission, the patient had respiratory failure and was on mechanical ventilation.  Her Respiratory failure was multifactorial including systolic CHF (EF <94%) and pneumonia.  She was initially treated with Zosyn and azithromycin.  She was discharged home with Augmentin.  Regarding her CHF, she was seen by cardiology, and the patient was started on carvedilol, Farxiga, spironolactone, and losartan. Since returning home, the patient states that she had remained somewhat short of breath but not any worsening.  She denies any worsening peripheral edema.  She denies any fevers, chills, nausea, vomiting or diarrhea abdominal pain.  She had a worsening cough without hemoptysis.  This resulted in her left pleuritic chest pain which prompted her return to the emergency department. In the ED, the patient had a low-grade temperature of 99.3.  She was initially tachycardic up to 130s.  She was hemodynamically stable.  Oxygen saturation is 98% on 2 L. CTA chest showed a small LUL pulmonary embolus.  There was RLL opacity with signs of early cavitation.  There is improving LLL infiltrate.  The patient was started on IV heparin.  WBC 20.0, hemoglobin 12.3, platelets 456,000.  Sodium 137, potassium 4.0, bicarbonate 25, serum creatinine 0.94.  UA showed 6-10 WBC.  Lactic acid 1.8>> 1.4.  EKG showed sinus tachycardia with left bundle branch block.   Assessment/Plan: Acute pulmonary embolus -CTA chest as described above -10/23-Limited echo EF 20-25%,  global HK, normal RVF, +IV septum flattening -Continue IV heparin with plans to transition to apixaban in the next 24 hours -Continue IV morphine every 3 hours as needed for pleuritic chest pain   Lobar pneumonia/necrotizing pneumonia? -08/10/2022 CTA chest--POS PE, RLL opacity with early cavitation -Continue vancomycin and cefepime -07/29/2022 CTA chest--bilateral LL consolidation, L>R; bilateral centrilobular GGO -Check PCT <0.10 -WBC up to 22.2 -pulmonary consult   Pleuritic chest pain -Morphine as needed as discussed above -hycodan prn cough   Chronic HFrEF -07/29/2022 echo EF <20%, global HK, grade 3 DD, mild to moderate MR -Continue home dose furosemide -Continue carvedilol, losartan, Jardiance -holding spironolactone temporarily due to uptrend K -presently appears euvolemic  Chronic respiratory failure with hypoxia -sent home on 2L on 08/04/22 -currently stable on 2L -may need to increase to 3-4 L with activity due to long cord at home   Impaired glucose tolerance -07/29/2022 hemoglobin A1c 6.3 -Lifestyle modification   Aortic atherosclerosis -hold statin due to elevated LFTs   Transaminasemia -likely due to hemodynamic changes -RUQ US--hepatic steatosis -no abd pain -trend LFTs>>improving --check COVID -19--neg     Family Communication:   daughter updated 10/24   Consultants:  none   Code Status:  FULL    DVT Prophylaxis:  IV Heparin           Subjective: Patient states cough is causing pleuritic cp.  States sob and pleuritic cp are 25% better.  Denies n/v/d, abd pain, hematochezia, melena  Objective: Vitals:   08/11/22 1320 08/11/22 1802 08/11/22 2038 08/12/22 0435  BP: 135/75 110/70 125/85 133/81  Pulse: 96 90 91 85  Resp: Marland Kitchen)  21 20 16 20   Temp: 98.1 F (36.7 C) 99.1 F (37.3 C) (!) 96.9 F (36.1 C) (!) 96.9 F (36.1 C)  TempSrc: Oral Oral    SpO2: 94% 92% 92% 93%  Weight:      Height:        Intake/Output Summary (Last 24 hours)  at 08/12/2022 1306 Last data filed at 08/12/2022 0555 Gross per 24 hour  Intake 1287.34 ml  Output 600 ml  Net 687.34 ml   Weight change:  Exam:  General:  Pt is alert, follows commands appropriately, not in acute distress HEENT: No icterus, No thrush, No neck mass, [place]/AT Cardiovascular: RRR, S1/S2, no rubs, no gallops Respiratory: bibasilar crackles. No wheeze Abdomen: Soft/+BS, non tender, non distended, no guarding Extremities: No edema, No lymphangitis, No petechiae, No rashes, no synovitis   Data Reviewed: I have personally reviewed following labs and imaging studies Basic Metabolic Panel: Recent Labs  Lab 08/10/22 1856 08/11/22 0343 08/12/22 0515  NA 136 137 137  K 3.7 4.0 4.5  CL 102 102 103  CO2 24 25 25   GLUCOSE 151* 141* 132*  BUN 19 18 23   CREATININE 1.20* 0.94 1.31*  CALCIUM 9.9 9.4 9.7  MG  --  2.3  --   PHOS  --  4.3  --    Liver Function Tests: Recent Labs  Lab 08/10/22 1856 08/11/22 0343 08/12/22 0515  AST 20 247* 74*  ALT 33 107* 101*  ALKPHOS 90 126 130*  BILITOT 0.5 0.8 0.7  PROT 8.4* 7.0 6.8  ALBUMIN 3.9 3.3* 3.0*   No results for input(s): "LIPASE", "AMYLASE" in the last 168 hours. No results for input(s): "AMMONIA" in the last 168 hours. Coagulation Profile: Recent Labs  Lab 08/10/22 1856  INR 1.1   CBC: Recent Labs  Lab 08/10/22 1856 08/11/22 0343 08/12/22 0515  WBC 20.0* 19.5* 22.2*  NEUTROABS 15.9*  --   --   HGB 12.3 12.2 10.6*  HCT 37.2 37.5 33.0*  MCV 84.9 86.2 87.3  PLT 450* 398 398   Cardiac Enzymes: No results for input(s): "CKTOTAL", "CKMB", "CKMBINDEX", "TROPONINI" in the last 168 hours. BNP: Invalid input(s): "POCBNP" CBG: Recent Labs  Lab 08/11/22 0028 08/11/22 0721 08/11/22 1141  GLUCAP 138* 109* 109*   HbA1C: No results for input(s): "HGBA1C" in the last 72 hours. Urine analysis:    Component Value Date/Time   COLORURINE YELLOW 08/11/2022 0545   APPEARANCEUR CLEAR 08/11/2022 0545    APPEARANCEUR Clear 11/20/2017 1200   LABSPEC 1.029 08/11/2022 0545   PHURINE 6.0 08/11/2022 0545   GLUCOSEU >=500 (A) 08/11/2022 0545   HGBUR NEGATIVE 08/11/2022 0545   BILIRUBINUR NEGATIVE 08/11/2022 0545   BILIRUBINUR Negative 11/20/2017 1200   KETONESUR NEGATIVE 08/11/2022 0545   PROTEINUR NEGATIVE 08/11/2022 0545   UROBILINOGEN 0.2 11/14/2010 1850   NITRITE NEGATIVE 08/11/2022 0545   LEUKOCYTESUR TRACE (A) 08/11/2022 0545   Sepsis Labs: @LABRCNTIP (procalcitonin:4,lacticidven:4) ) Recent Results (from the past 240 hour(s))  Culture, blood (Routine x 2)     Status: None (Preliminary result)   Collection Time: 08/10/22  6:57 PM   Specimen: Left Antecubital; Blood  Result Value Ref Range Status   Specimen Description LEFT ANTECUBITAL  Final   Special Requests   Final    BOTTLES DRAWN AEROBIC AND ANAEROBIC Blood Culture adequate volume   Culture   Final    NO GROWTH 2 DAYS Performed at [facility], [address] [place], [place] [address]    Report Status PENDING  Incomplete  Culture, blood (Routine x 2)     Status: None (Preliminary result)   Collection Time: 08/10/22  7:09 PM   Specimen: BLOOD RIGHT FOREARM  Result Value Ref Range Status   Specimen Description BLOOD RIGHT FOREARM  Final   Special Requests   Final    BOTTLES DRAWN AEROBIC AND ANAEROBIC Blood Culture adequate volume   Culture   Final    NO GROWTH 2 DAYS Performed at [facility], [address] [place], [place] [address]    Report Status PENDING  Incomplete  SARS Coronavirus 2 by RT PCR (hospital order, performed in [place] hospital lab) *cepheid single result test* Anterior Nasal Swab     Status: None   Collection Time: 08/11/22  8:06 AM   Specimen: Anterior Nasal Swab  Result Value Ref Range Status   SARS Coronavirus 2 by RT PCR NEGATIVE NEGATIVE Final    Comment: (NOTE) SARS-CoV-2 target nucleic acids are NOT DETECTED.  The SARS-CoV-2 RNA is generally detectable in upper and lower respiratory  specimens during the acute phase of infection. The lowest concentration of SARS-CoV-2 viral copies this assay can detect is 250 copies / mL. A negative result does not preclude SARS-CoV-2 infection and should not be used as the sole basis for treatment or other patient management decisions.  A negative result may occur with improper specimen collection / handling, submission of specimen other than nasopharyngeal swab, presence of viral mutation(s) within the areas targeted by this assay, and inadequate number of viral copies (<250 copies / mL). A negative result must be combined with clinical observations, patient history, and epidemiological information.  Fact Sheet for Patients:   https://www.patel.info/  Fact Sheet for Healthcare Providers: https://hall.com/  This test is not yet approved or  cleared by the [place] FDA and has been authorized for detection and/or diagnosis of SARS-CoV-2 by FDA under an Emergency Use Authorization (EUA).  This EUA will remain in effect (meaning this test can be used) for the duration of the COVID-19 declaration under Section 564(b)(1) of the Act, 21 U.S.C. section 360bbb-3(b)(1), unless the authorization is terminated or revoked sooner.  Performed at [facility], [address] [place], [place] [address]      Scheduled Meds:  aspirin EC  81 mg Oral Daily   atorvastatin  40 mg Oral Daily   carvedilol  3.125 mg Oral BID WC   dextromethorphan-guaiFENesin  1 tablet Oral BID   empagliflozin  10 mg Oral Daily   furosemide  40 mg Oral Daily   lidocaine  1 patch Transdermal Q24H   losartan  25 mg Oral Daily   pantoprazole  40 mg Oral Daily   Continuous Infusions:  ceFEPime (MAXIPIME) IV 2 g (08/12/22 1010)   heparin 1,000 Units/hr (08/12/22 0349)   vancomycin Stopped (08/11/22 2328)    Procedures/Studies: ECHOCARDIOGRAM LIMITED  Result Date: 08/11/2022    ECHOCARDIOGRAM LIMITED REPORT    Patient Name:   GRACIELYNN BIRKEL Khanna Date of Exam: 08/11/2022 Medical Rec #:  [id number]     Height:       64.0 in Accession #:    [id number]    Weight:       144.0 lb Date of Birth:  [birth year]     BSA:          1.701 m Patient Age:    85 years      BP:           127/62 mmHg Patient Gender: F  HR:           102 bpm. [facility] Procedure: Limited Echo and Limited Color Doppler Indications:    Pulmonary embolus  History:        Patient has prior history of Echocardiogram examinations, most                 recent 07/29/2022. CHF, Arrythmias:LBBB, Signs/Symptoms:Chest                 Pain; Risk Factors:Hypertension and Diabetes. Pulmonary embolus.  Sonographer:    Mikki Harbor Referring Phys: [id number] OLADAPO ADEFESO IMPRESSIONS  1. Global hypokinesis with anteroseptal akinesis.. Left ventricular ejection fraction, by estimation, is 20 to 25%. The left ventricle has severely decreased function. The left ventricle demonstrates global hypokinesis. The left ventricular internal cavity size was moderately dilated.  2. Systolic flattening of the ventricular septum suggests RV pressure overload. . Right ventricular systolic function is normal. The right ventricular size is normal. There is mildly elevated pulmonary artery systolic pressure.  3. The tricuspid valve is abnormal. Tricuspid valve regurgitation is mild.  4. The inferior vena cava is normal in size with greater than 50% respiratory variability, suggesting right atrial pressure of 3 mmHg.  5. Limited echo to evaluate RV function FINDINGS  Left Ventricle: Global hypokinesis with anteroseptal akinesis. Left ventricular ejection fraction, by estimation, is 20 to 25%. The left ventricle has severely decreased function. The left ventricle demonstrates global hypokinesis. The left ventricular internal cavity size was moderately dilated. There is no left ventricular hypertrophy. Right Ventricle: Systolic flattening of the ventricular septum suggests  RV pressure overload. The right ventricular size is normal. Right vetricular wall thickness was not well visualized. Right ventricular systolic function is normal. There is mildly elevated pulmonary artery systolic pressure. The tricuspid regurgitant velocity is 3.12 m/s, and with an assumed right atrial pressure of 3 mmHg, the estimated right ventricular systolic pressure is 41.9 mmHg. Pericardium: There is no evidence of pericardial effusion. Tricuspid Valve: The tricuspid valve is abnormal. Tricuspid valve regurgitation is mild . No evidence of tricuspid stenosis. Aorta: The aortic root is normal in size and structure. Venous: The inferior vena cava is normal in size with greater than 50% respiratory variability, suggesting right atrial pressure of 3 mmHg. LEFT VENTRICLE PLAX 2D LVIDd:         6.60 cm LVIDs:         5.60 cm LV PW:         0.80 cm LV IVS:        1.00 cm LVOT diam:     2.00 cm LVOT Area:     3.14 cm  LV Volumes (MOD) LV vol d, MOD A2C: 151.0 ml LV vol d, MOD A4C: 145.0 ml LV vol s, MOD A2C: 102.0 ml LV vol s, MOD A4C: 126.0 ml LV SV MOD A2C:     49.0 ml LV SV MOD A4C:     145.0 ml LV SV MOD BP:      34.0 ml RIGHT VENTRICLE RV Basal diam:  3.00 cm RV Mid diam:    2.10 cm RV S prime:     15.60 cm/s LEFT ATRIUM           Index        RIGHT ATRIUM           Index LA diam:      4.60 cm 2.70 cm/m   RA Area:     10.50 cm LA  Vol [facility]): 42.1 ml 24.74 ml/m  RA Volume:   19.70 ml  11.58 ml/m LA Vol (A4C): 59.1 ml 34.74 ml/m   AORTA Ao Root diam: 3.00 cm TRICUSPID VALVE TR Peak grad:   38.9 mmHg TR Vmax:        312.00 cm/s  SHUNTS Systemic Diam: 2.00 cm Dina Rich MD Electronically signed by Dina Rich MD Signature Date/Time: 08/11/2022/1:02:42 PM    Final    US Venous Img Lower Bilateral (DVT)  Result Date: 08/11/2022 CLINICAL DATA:  Pulmonary embolism. EXAM: BILATERAL LOWER EXTREMITY VENOUS DOPPLER ULTRASOUND TECHNIQUE: Gray-scale sonography with graded compression, as well as color  Doppler and duplex ultrasound were performed to evaluate the lower extremity deep venous systems from the level of the common femoral vein and including the common femoral, femoral, profunda femoral, popliteal and calf veins including the posterior tibial, peroneal and gastrocnemius veins when visible. The superficial great saphenous vein was also interrogated. Spectral Doppler was utilized to evaluate flow at rest and with distal augmentation maneuvers in the common femoral, femoral and popliteal veins. COMPARISON:  None Available. FINDINGS: RIGHT LOWER EXTREMITY Common Femoral Vein: No evidence of thrombus. Normal compressibility, respiratory phasicity and response to augmentation. Saphenofemoral Junction: No evidence of thrombus. Normal compressibility and flow on color Doppler imaging. Profunda Femoral Vein: No evidence of thrombus. Normal compressibility and flow on color Doppler imaging. Femoral Vein: No evidence of thrombus. Normal compressibility, respiratory phasicity and response to augmentation. Popliteal Vein: No evidence of thrombus. Normal compressibility, respiratory phasicity and response to augmentation. Calf Veins: Thrombus visualized in the posterior tibial and peroneal veins in the calf. Superficial Great Saphenous Vein: Superficial thrombophlebitis of the great saphenous vein at the level of the knee extending into some communicating superficial varicosities. Venous Reflux:  None. Other Findings:  None. LEFT LOWER EXTREMITY Common Femoral Vein: No evidence of thrombus. Normal compressibility, respiratory phasicity and response to augmentation. Saphenofemoral Junction: No evidence of thrombus. Normal compressibility and flow on color Doppler imaging. Profunda Femoral Vein: No evidence of thrombus. Normal compressibility and flow on color Doppler imaging. Femoral Vein: No evidence of thrombus. Normal compressibility, respiratory phasicity and response to augmentation. Popliteal Vein: Nonocclusive  thrombus in the left popliteal vein. Calf Veins: Thrombus visualized in the left peroneal vein. Posterior tibial and anterior tibial veins demonstrate normal patency. Superficial Great Saphenous Vein: No evidence of thrombus. Normal compressibility. Venous Reflux:  None. Other Findings: No evidence of superficial thrombophlebitis or abnormal fluid collection. IMPRESSION: 1. Positive for deep vein thrombosis in the right calf veins and in the left popliteal and peroneal veins. 2. Superficial thrombophlebitis of the right great saphenous vein at the level of the knee extending into some communicating superficial varicosities. Electronically Signed   By: Irish Lack M.D.   On: 08/11/2022 08:53   US Abdomen Limited RUQ (LIVER/GB)  Result Date: 08/11/2022 CLINICAL DATA:  Transaminasemia. Status post cholecystectomy and around 2005. EXAM: ULTRASOUND ABDOMEN LIMITED RIGHT UPPER QUADRANT COMPARISON:  CT chest, abdomen, and pelvis 07/29/2022 FINDINGS: Gallbladder: The gallbladder is surgically absent. Common bile duct: Diameter: 2 mm, within normal limits. No intrahepatic or extrahepatic biliary ductal dilatation. Liver: Smooth liver contours. Mildly increased hepatic echogenicity. Mildly heterogeneous hepatic echotexture. Portal vein is patent on color Doppler imaging with normal direction of blood flow towards the liver. Other: None. IMPRESSION: 1. Mildly increased hepatic echogenicity, most compatible with hepatic steatosis. Low-density suggesting hepatic steatosis was also seen on recent CT. 2. Status post cholecystectomy. No biliary ductal dilatation. Electronically Signed  By: Neita Garnet M.D.   On: 08/11/2022 08:51   CT Angio Chest PE W/Cm &/Or Wo Cm  Result Date: 08/10/2022 CLINICAL DATA:  Concern for pulmonary embolism. EXAM: CT ANGIOGRAPHY CHEST WITH CONTRAST TECHNIQUE: Multidetector CT imaging of the chest was performed using the standard protocol during bolus administration of intravenous  contrast. Multiplanar CT image reconstructions and MIPs were obtained to evaluate the vascular anatomy. RADIATION DOSE REDUCTION: This exam was performed according to the departmental dose-optimization program which includes automated exposure control, adjustment of the mA and/or kV according to patient size and/or use of iterative reconstruction technique. CONTRAST:  75mL OMNIPAQUE IOHEXOL 350 MG/ML SOLN COMPARISON:  Chest radiograph dated 08/10/2022 and CT dated 07/29/2022. FINDINGS: Evaluation of this exam is limited due to respiratory motion artifact. Cardiovascular: There is mild cardiomegaly with mild dilatation of the left cardiac chambers. No pericardial effusion. Mild atherosclerotic calcification of the thoracic aorta. No aneurysmal dilatation or dissection. Evaluation of the pulmonary arteries is limited due to respiratory motion. Small left upper lobe segmental pulmonary artery emboli noted. No CT evidence of right heart straining. Mediastinum/Nodes: No hilar or mediastinal adenopathy. The esophagus is grossly unremarkable. No mediastinal fluid collection. Lungs/Pleura: Small left pleural effusion. There is left lung base partial compressive atelectasis or infiltrate. Overall improved aeration of the left lower lobe compared to prior CT. Clusters of airspace opacity in the right lower lobe most consistent with pneumonia. There may be areas of early cavitary change at the right lung base. Attention on follow-up imaging recommended. Small subpleural nodule in the superior segment of the left lower lobe, likely post inflammatory and residual from prior infiltration. There is a 5 mm nodule or para fissural lymph node along the left fissure (71/6). There is no pneumothorax. The central airways are patent. Upper Abdomen: Cholecystectomy. Musculoskeletal: Osteopenia with degenerative changes of the spine. No acute osseous pathology. Review of the MIP images confirms the above findings. IMPRESSION: 1. Small left  upper lobe segmental pulmonary artery emboli. No CT evidence of right heart straining. 2. Right lower lobe pneumonia. There may be areas of early cavitary change at the right lung base. Attention on follow-up imaging recommended. 3. Small left pleural effusion with left lung base partial compressive atelectasis or infiltrate. Overall improved aeration of the left lower lobe compared to prior CT. 4. Mild cardiomegaly with mild dilatation of the left cardiac chambers. 5. A 5 mm nodule or para fissural lymph node along the left fissure. 6.  Aortic Atherosclerosis (ICD10-I70.0). These results were called by telephone at the time of interpretation on 08/10/2022 at 10:43 pm to provider Alvino Blood , who verbally acknowledged these results. Electronically Signed   By: Elgie Collard M.D.   On: 08/10/2022 22:44   DG Chest 2 View  Result Date: 08/10/2022 CLINICAL DATA:  Suspected Sepsis EXAM: CHEST - 2 VIEW COMPARISON:  Chest x-ray 08/01/2022, CT chest 07/29/2022 FINDINGS: The heart and mediastinal contours are unchanged. Question right lower lung zone airspace opacity versus hilar lymph node. Retrocardiac airspace opacity. No pulmonary edema. Small left pleural effusion. No pneumothorax. No acute osseous abnormality. IMPRESSION: 1. Small left pleural effusion. 2. Retrocardiac airspace opacity may represent a combination of infection/inflammation versus atelectasis. 3. Question right lower lung zone airspace opacity versus hilar lymph node. Electronically Signed   By: Tish Frederickson M.D.   On: 08/10/2022 19:10   DG Chest Port 1 View  Result Date: 08/01/2022 CLINICAL DATA:  Fever EXAM: PORTABLE CHEST 1 VIEW COMPARISON:  Chest radiograph 07/31/2022  FINDINGS: No pleural effusion. No pneumothorax. Normal lung volumes. Unchanged cardiac and mediastinal contours. Compared to prior exam there is slight interval increase in conspicuity of a retrocardiac opacity, which could represent atelectasis, but infection is  not excluded. No displaced rib fractures. The visualized upper abdomen is unremarkable. IMPRESSION: Compared to prior exam there is slight interval increase in conspicuity of a retrocardiac opacity, which could represent atelectasis, but infection is not excluded. Electronically Signed   By: Lorenza Cambridge M.D.   On: 08/01/2022 09:49   DG Chest Port 1 View  Result Date: 07/31/2022 CLINICAL DATA:  Bibasilar crackles. EXAM: PORTABLE CHEST 1 VIEW COMPARISON:  07/31/2022 FINDINGS: Interval removal of enteric and endotracheal tubes. Mild cardiac enlargement. No vascular congestion or edema. Lung bases and costophrenic angles are clear. No pneumothorax. IMPRESSION: Cardiac enlargement.  Lungs appear clear postextubation. Electronically Signed   By: Burman Nieves M.D.   On: 07/31/2022 23:34   DG Chest Port 1 View  Result Date: 07/31/2022 CLINICAL DATA:  Acute respiratory failure EXAM: PORTABLE CHEST 1 VIEW COMPARISON:  Chest 07/30/2022 FINDINGS: Endotracheal tube 2.5 cm above the carina. NG tube in the stomach with the tip not visualized. Left lower lobe consolidation with mild progression. Mild right lower lobe airspace disease with progression. IMPRESSION: 1. Endotracheal tube in good position. 2. Progression of bibasilar airspace disease left greater than right. Electronically Signed   By: Marlan Palau M.D.   On: 07/31/2022 14:01   DG Chest Port 1 View  Result Date: 07/30/2022 CLINICAL DATA:  Acute on chronic respiratory failure with hypoxemia. EXAM: PORTABLE CHEST 1 VIEW COMPARISON:  07/29/2022 FINDINGS: ET tube tip is above the carina. Enteric tube tip courses below the level of the hemidiaphragms. Unchanged asymmetric elevation of the right hemidiaphragm. Persistent airspace disease within the left lower lobe which appears unchanged from the previous exam. Decreased interstitial edema pattern. IMPRESSION: 1. Stable support apparatus. 2. Persistent left lower lobe airspace disease. 3. Decreased  interstitial edema pattern. Electronically Signed   By: Signa Kell M.D.   On: 07/30/2022 08:54   ECHOCARDIOGRAM COMPLETE  Result Date: 07/29/2022    ECHOCARDIOGRAM REPORT   Patient Name:   RETHA BITHER Mcmackin Date of Exam: 07/29/2022 Medical Rec #:  [id number]     Height:       64.0 in Accession #:    [id number]    Weight:       145.7 lb Date of Birth:  19-Jul-1951     BSA:          1.710 m Patient Age:    70 years      BP:           102/69 mmHg Patient Gender: F             HR:           95 bpm. [facility] Procedure: 2D Echo, Color Doppler, Cardiac Doppler and Intracardiac            Opacification Agent Indications:    I50.21 Acute systolic (congestive) heart failure  History:        Patient has prior history of Echocardiogram examinations, most                 recent 01/08/2011. Arrythmias:LBBB; Risk Factors:Hypertension and                 Diabetes.  Sonographer:    Irving Burton Senior RDCS Referring Phys: [id number] OLADAPO ADEFESO  Sonographer Comments: Scanned  supine on artificial respirator. IMPRESSIONS  1. Left ventricular ejection fraction, by estimation, is <20%. The left ventricle has severely decreased function. The left ventricle demonstrates global hypokinesis. The left ventricular internal cavity size was moderately dilated. Left ventricular diastolic parameters are consistent with Grade III diastolic dysfunction (restrictive).  2. Right ventricular systolic function is normal. The right ventricular size is normal.  3. The mitral valve is normal in structure. Mild to moderate mitral valve regurgitation. No evidence of mitral stenosis.  4. The aortic valve is tricuspid. Aortic valve regurgitation is not visualized. No aortic stenosis is present.  5. The inferior vena cava is normal in size with greater than 50% respiratory variability, suggesting right atrial pressure of 3 mmHg. FINDINGS  Left Ventricle: Left ventricular ejection fraction, by estimation, is <20%. The left ventricle has severely  decreased function. The left ventricle demonstrates global hypokinesis. Definity contrast agent was given IV to delineate the left ventricular endocardial borders. The left ventricular internal cavity size was moderately dilated. There is no left ventricular hypertrophy. Abnormal (paradoxical) septal motion, consistent with left bundle branch block. Left ventricular diastolic parameters are consistent with Grade III diastolic dysfunction (restrictive). Right Ventricle: The right ventricular size is normal. No increase in right ventricular wall thickness. Right ventricular systolic function is normal. Left Atrium: Left atrial size was normal in size. Right Atrium: Right atrial size was normal in size. Pericardium: There is no evidence of pericardial effusion. Mitral Valve: The mitral valve is normal in structure. Mild to moderate mitral valve regurgitation. No evidence of mitral valve stenosis. Tricuspid Valve: The tricuspid valve is normal in structure. Tricuspid valve regurgitation is not demonstrated. No evidence of tricuspid stenosis. Aortic Valve: The aortic valve is tricuspid. Aortic valve regurgitation is not visualized. No aortic stenosis is present. Pulmonic Valve: The pulmonic valve was normal in structure. Pulmonic valve regurgitation is mild. No evidence of pulmonic stenosis. Aorta: The aortic root is normal in size and structure. Venous: The inferior vena cava is normal in size with greater than 50% respiratory variability, suggesting right atrial pressure of 3 mmHg. IAS/Shunts: No atrial level shunt detected by color flow Doppler.  LEFT VENTRICLE PLAX 2D LVIDd:         5.90 cm      Diastology LVIDs:         4.50 cm      LV e' medial:    4.90 cm/s LV PW:         1.00 cm      LV E/e' medial:  20.8 LV IVS:        0.90 cm      LV e' lateral:   7.94 cm/s LVOT diam:     1.90 cm      LV E/e' lateral: 12.8 LV SV:         30 LV SV Index:   18 LVOT Area:     2.84 cm  LV Volumes (MOD) LV vol d, MOD A2C: 131.0 ml  LV vol d, MOD A4C: 114.0 ml LV vol s, MOD A2C: 114.0 ml LV vol s, MOD A4C: 102.0 ml LV SV MOD A2C:     17.0 ml LV SV MOD A4C:     114.0 ml LV SV MOD BP:      14.8 ml RIGHT VENTRICLE RV S prime:     9.03 cm/s TAPSE (M-mode): 1.9 cm LEFT ATRIUM             Index  RIGHT ATRIUM           Index LA diam:        4.00 cm 2.34 cm/m   RA Area:     12.70 cm LA Vol (A2C):   56.3 ml 32.92 ml/m  RA Volume:   26.30 ml  15.38 ml/m LA Vol (A4C):   39.7 ml 23.22 ml/m LA Biplane Vol: 48.8 ml 28.54 ml/m  AORTIC VALVE LVOT Vmax:   92.70 cm/s LVOT Vmean:  62.300 cm/s LVOT VTI:    0.106 m  AORTA Ao Root diam: 3.00 cm Ao Asc diam:  3.30 cm MITRAL VALVE MV Area (PHT): 5.50 cm     SHUNTS MV Decel Time: 138 msec     Systemic VTI:  0.11 m MV E velocity: 102.00 cm/s  Systemic Diam: 1.90 cm MV A velocity: 30.00 cm/s MV E/A ratio:  3.40 Donato Schultz MD Electronically signed by Donato Schultz MD Signature Date/Time: 07/29/2022/10:33:16 AM    Final    DG Chest Portable 1 View  Result Date: 07/29/2022 CLINICAL DATA:  NG tube placement. EXAM: PORTABLE CHEST 1 VIEW COMPARISON:  07/29/2022 FINDINGS: 0451 hours. Endotracheal tube tip is 1.3 cm above the base of the carina. The NG tube passes into the stomach although the distal tip position is not included on the film. The cardio pericardial silhouette is enlarged. Vascular congestion noted with underlying chronic interstitial coarsening. Mild bibasilar atelectasis noted without substantial pleural effusion. Telemetry leads overlie the chest. IMPRESSION: Endotracheal tube tip is 1.3 cm above the base of the carina. NG tube passes into the stomach. Cardiomegaly with vascular congestion and basilar atelectasis. Electronically Signed   By: Kennith Center M.D.   On: 07/29/2022 05:30   CT Angio Chest/Abd/Pel for Dissection W and/or Wo Contrast  Addendum Date: 07/29/2022   ADDENDUM REPORT: 07/29/2022 05:09 ADDENDUM: ETT position and other salient findings discussed by telephone with Dr.  Geoffery Lyons on 07/29/2022 at 0501 hours. Electronically Signed   By: Odessa Fleming M.D.   On: 07/29/2022 05:09   Result Date: 07/29/2022 CLINICAL DATA:  71 year old female unresponsive at home. Altered mental status. Intubated. EXAM: CT ANGIOGRAPHY CHEST, ABDOMEN AND PELVIS TECHNIQUE: Non-contrast CT of the chest was initially obtained. Multidetector CT imaging through the chest, abdomen and pelvis was performed using the standard protocol during bolus administration of intravenous contrast. Multiplanar reconstructed images and MIPs were obtained and reviewed to evaluate the vascular anatomy. RADIATION DOSE REDUCTION: This exam was performed according to the departmental dose-optimization program which includes automated exposure control, adjustment of the mA and/or kV according to patient size and/or use of iterative reconstruction technique. CONTRAST:  OMNIPAQUE IOHEXOL 350 MG/ML SOLN COMPARISON:  Portable chest 0347 hours today. CTA chest 01/06/2011. CT Abdomen and Pelvis 12/25/2021. FINDINGS: CTA CHEST FINDINGS Cardiovascular: Calcified aortic atherosclerosis. Calcified coronary artery atherosclerosis. Mild cardiomegaly appears stable since March. No pericardial effusion. Following contrast there is no thoracic aorta dissection. Incidental cardiac pulsation artifact. No thoracic aortic aneurysm. Also, the central pulmonary arteries are enhancing and appear to be patent. Mediastinum/Nodes: Negative. Lungs/Pleura: Intubated, endotracheal tube tip terminates just at the right mainstem bronchus on series 10, image 72. Atelectatic changes to the airways at the carina. Dependent, confluent pulmonary consolidation in both lower lobes, greater on the left. Early dependent upper lobe involvement. Small pulmonary arterial branches appear to be enhancing in the areas of consolidation. Superimposed perihilar and centrilobular pulmonary ground-glass opacity in both lungs with some septal thickening. Trace layering  pleural effusions. Musculoskeletal:  Respiratory motion at the sternum and ribs. No acute osseous abnormality identified. Review of the MIP images confirms the above findings. CTA ABDOMEN AND PELVIS FINDINGS VASCULAR Mild Aortoiliac calcified atherosclerosis. Negative for abdominal aortic aneurysm or dissection. Major arterial structures are patent. Portal venous system also appears to be patent. Review of the MIP images confirms the above findings. NON-VASCULAR Hepatobiliary: Chronically absent gallbladder. Liver appears stable and negative when allowing for motion artifact. Pancreas: Stable, negative. Spleen: Within normal limits. Adrenals/Urinary Tract: Stable, negative adrenal glands. Asymmetric right renal cortical scarring and volume loss. Symmetric renal enhancement. Motion artifact. No convincing hydronephrosis or pararenal inflammation. Ureters appear decompressed. No obvious urinary calculus. Unremarkable bladder. Stomach/Bowel: Intermittent motion artifact. Redundant but decompressed large bowel except for retained stool in the transverse colon. Normal retrocecal appendix on series 6, image 152. No dilated small bowel. Small volume of air in fluid in the stomach. Negative duodenum. No free air, free fluid, or mesenteric inflammation identified. Lymphatic: No lymphadenopathy. Reproductive: Chronically absent uterus, diminutive or absent ovaries. Other: No pelvic free fluid. Musculoskeletal: No acute osseous abnormality identified. Review of the MIP images confirms the above findings. IMPRESSION: 1. Intubated, endotracheal tube tip terminates just inside the right mainstem bronchus. Recommend retracting 1 cm. 2. Negative for aortic dissection or aneurysm. Aortic Atherosclerosis (ICD10-I70.0). 3. Positive for dependent consolidation in both lungs, especially lower lobes greater on the left. This is most compatible with Pneumonia Versus Aspiration. 4. Trace layering pleural effusions and additional central  pulmonary ground-glass opacity with septal thickening raising the possibility of mild or developing interstitial edema. Stable cardiomegaly. No pericardial effusion. 5. No acute or inflammatory process identified in the abdomen or pelvis. Chronic right renal cortical scarring. Electronically Signed: By: Odessa Fleming M.D. On: 07/29/2022 04:57   CT Head Wo Contrast  Result Date: 07/29/2022 CLINICAL DATA:  71 year old female unresponsive at home. Altered mental status. Intubated. EXAM: CT HEAD WITHOUT CONTRAST TECHNIQUE: Contiguous axial images were obtained from the base of the skull through the vertex without intravenous contrast. RADIATION DOSE REDUCTION: This exam was performed according to the departmental dose-optimization program which includes automated exposure control, adjustment of the mA and/or kV according to patient size and/or use of iterative reconstruction technique. COMPARISON:  Head CT 08/03/2009. FINDINGS: Brain: Cerebral volume is within normal limits for age. No midline shift, ventriculomegaly, mass effect, evidence of mass lesion, intracranial hemorrhage or evidence of cortically based acute infarction. Gray-white matter differentiation is within normal limits for age. No convincing encephalomalacia. Vascular: Calcified atherosclerosis at the skull base. No suspicious intracranial vascular hyperdensity. Skull: Stable.  No acute osseous abnormality identified. Sinuses/Orbits: Visualized paranasal sinuses and mastoids are stable and well aerated. Other: Visualized orbits and scalp soft tissues are within normal limits. IMPRESSION: Normal for age non contrast CT appearance of the brain, head. Electronically Signed   By: Odessa Fleming M.D.   On: 07/29/2022 04:48   DG Chest Portable 1 View  Result Date: 07/29/2022 CLINICAL DATA:  71 year old female with shortness of breath. EXAM: PORTABLE CHEST 1 VIEW COMPARISON:  Chest radiographs 12/31/2011 and earlier. CT Abdomen and Pelvis 12/25/2021. FINDINGS:  Portable AP semi upright view at 0347 hours. Cardiomegaly, new since 2013 but appears stable from the CT Abdomen and Pelvis this year. Other mediastinal contours are within normal limits. Visualized tracheal air column is within normal limits. Lung volumes are within normal limits. No pneumothorax, pleural effusion or consolidation. Mild streaky bilateral perihilar opacity. Increased pulmonary vascularity from prior radiographs, but no overt edema. No  acute osseous abnormality identified. Paucity of bowel gas in the upper abdomen. IMPRESSION: 1. Cardiomegaly with vascular congestion but no overt edema. 2. Streaky bilateral perihilar opacity, nonspecific but favor atelectasis. Electronically Signed   By: Odessa Fleming M.D.   On: 07/29/2022 04:00    Catarina Hartshorn, DO  [facility]  If 7PM-7AM, please contact night-coverage [email or web address] Password [email or web address] 08/12/2022, 1:06 PM   LOS: 2 days

## 2022-08-12 NOTE — Progress Notes (Signed)
ANTICOAGULATION CONSULT NOTE -   Pharmacy Consult for Heparin  Indication: pulmonary embolus  No Known Allergies  Patient Measurements: Height: 5\' 4"  (162.6 cm) Weight: 65.3 kg (144 lb) IBW/kg (Calculated) : 54.7  Vital Signs: Temp: 96.9 F (36.1 C) (10/24 0435) BP: 133/81 (10/24 0435) Pulse Rate: 85 (10/24 0435)  Labs: Recent Labs    08/10/22 1856 08/10/22 2153 08/11/22 0343 08/11/22 0906 08/11/22 1521 08/12/22 0515  HGB 12.3  --  12.2  --   --  10.6*  HCT 37.2  --  37.5  --   --  33.0*  PLT 450*  --  398  --   --  398  LABPROT 14.3  --   --   --   --   --   INR 1.1  --   --   --   --   --   HEPARINUNFRC  --   --   --  0.60 0.53 0.35  CREATININE 1.20*  --  0.94  --   --  1.31*  TROPONINIHS  --  14  --   --   --   --      Estimated Creatinine Clearance: 34.5 mL/min (A) (by C-G formula based on SCr of 1.31 mg/dL (H)).   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 Found to have a PE via CT angio. Starting heparin. Labs above reviewed. PTA meds reviewed.  HL 0.35 , remains therapeutic  Goal of Therapy:  Heparin level 0.3-0.7 units/ml Monitor platelets by anticoagulation protocol: Yes   Plan:  Continue heparin drip at 1000 units/hr Daily CBC/Heparin level Monitor for bleeding F/U transition to po DOAC  Isac Sarna, BS Pharm D, BCPS Clinical Pharmacist

## 2022-08-12 NOTE — Consult Note (Signed)
NAME:  Summer Christensen, MRN:  [id number], DOB:  May 07, 1951, LOS: 2 ADMISSION DATE:  08/10/2022, CONSULTATION DATE:  10/24  REFERRING MD:  Tat   CHIEF COMPLAINT:  cough /cp    History of Present Illness:  40 yobf never smoker s/p ET 10/0-10/12 for likely Sys chf complicated by ? Aspiration with nl flora on ET specimen and persistent LLL decreased in aeration at time of d/c on 10/16 on 2lpm and persistent severe dry coughing fits since d/c  then 10/18 developed  L  pleuritic cp.    Pertinent  Medical History   71 y.o. 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  Pain was constant and was rated as 10/10 on pain scale which worsens on coughing.  She states that she has not been moving around much since being discharged on 10/16.  She denies fever, chills, nausea, vomiting, abdominal pain.  Patient was admitted from 10/10 to 10/16 due to acute respiratory failure with hypoxia, acute CHF (systolic and diastolic) and was discharged with Lasix, Coreg, Farxiga, spironolactone and ARB.  Significant Hospital Events: Including procedures, antibiotic start and stop dates in addition to other pertinent events   CTa  10/22 1. Small left upper lobe segmental pulmonary artery emboli. No CT evidence of right heart straining. 2. Right lower lobe pneumonia. There may be areas of early cavitary change at the right lung base. 3. Small left pleural effusion with left lung base partial compressive atelectasis or infiltrate. Overall improved aeration of the left lower lobe compared to prior CT. 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   Scheduled Meds:  aspirin EC  81 mg Oral Daily   atorvastatin  40 mg Oral Daily    carvedilol  3.125 mg Oral BID WC   dextromethorphan-guaiFENesin  1 tablet Oral BID   empagliflozin  10 mg Oral Daily   furosemide  40 mg Oral Daily   lidocaine  1 patch Transdermal Q24H   losartan  25 mg Oral Daily   pantoprazole  40 mg Oral Daily   Continuous Infusions:  ceFEPime (MAXIPIME) IV 2 g (08/12/22 1010)   heparin 1,000 Units/hr (08/12/22 0349)   vancomycin Stopped (08/11/22 2328)   PRN Meds:.acetaminophen **OR** acetaminophen, HYDROcodone bit-homatropine, morphine injection, ondansetron **OR** ondansetron (ZOFRAN) IV  Interim History / Subjective:  Still with severe cough / L ant cp with coughing   Objective   Blood pressure 133/81, pulse 85, temperature (!) 96.9 F (36.1 C), resp. rate 20, height 5' 4"  (1.626 m), weight 65.3 kg, SpO2 93 %.        Intake/Output Summary (Last 24 hours) at 08/12/2022 1307 Last data filed at 08/12/2022 0555 Gross per 24 hour  Intake 1287.34 ml  Output 600 ml  Net 687.34 ml   Filed Weights   08/10/22 1818  Weight: 65.3 kg    Examination: Tmax:  99.1 General appearance:    very uncomfortable elderly bf  sitting on side of bed holding L ant chest wall and bending over the bedside table   At Rest 02 sats  99% on 2lpm   No jvd Oropharynx clear,  mucosa nl Neck supple Lungs with distant bs both bases, no cw instability or rubs  RRR no s3 or  or sign murmur Abd soft nl excursion  Extr warm with no edema or clubbing noted - thrombosed vericose veins on L  Neuro  Sensorium intact ,  no apparent motor deficits         Best Practice (right click and "Reselect all SmartList Selections" daily)  Per [facility]   Labs   CBC: Recent Labs  Lab 08/10/22 1856 08/11/22 0343 08/12/22 0515  WBC 20.0* 19.5* 22.2*  NEUTROABS 15.9*  --   --   HGB 12.3 12.2 10.6*  HCT 37.2 37.5 33.0*  MCV 84.9 86.2 87.3  PLT 450* 398 505    Basic Metabolic Panel: Recent Labs  Lab 08/10/22 1856 08/11/22 0343 08/12/22 0515  NA 136 137 137  K 3.7 4.0  4.5  CL 102 102 103  CO2 24 25 25   GLUCOSE 151* 141* 132*  BUN 19 18 23   CREATININE 1.20* 0.94 1.31*  CALCIUM 9.9 9.4 9.7  MG  --  2.3  --   PHOS  --  4.3  --    GFR: Estimated Creatinine Clearance: 34.5 mL/min (A) (by C-G formula based on SCr of 1.31 mg/dL (H)). Recent Labs  Lab 08/10/22 1856 08/10/22 1909 08/10/22 2155 08/11/22 0343 08/12/22 0515  PROCALCITON  --   --   --  <0.10  --   WBC 20.0*  --   --  19.5* 22.2*  LATICACIDVEN  --  1.8 1.4  --   --     Liver Function Tests: Recent Labs  Lab 08/10/22 1856 08/11/22 0343 08/12/22 0515  AST 20 247* 74*  ALT 33 107* 101*  ALKPHOS 90 126 130*  BILITOT 0.5 0.8 0.7  PROT 8.4* 7.0 6.8  ALBUMIN 3.9 3.3* 3.0*   No results for input(s): "LIPASE", "AMYLASE" in the last 168 hours. No results for input(s): "AMMONIA" in the last 168 hours.  ABG    Component Value Date/Time   PHART 7.42 07/30/2022 0319   PCO2ART 35 07/30/2022 0319   PO2ART 100 07/30/2022 0319   HCO3 30.2 (H) 07/31/2022 2129   TCO2 24 07/29/2022 0351   ACIDBASEDEF 1.3 07/30/2022 0319   O2SAT 26.8 07/31/2022 2129     Coagulation Profile: Recent Labs  Lab 08/10/22 1856  INR 1.1    Cardiac Enzymes: No results for input(s): "CKTOTAL", "CKMB", "CKMBINDEX", "TROPONINI" in the last 168 hours.  HbA1C: Hgb A1c MFr Bld  Date/Time Value Ref Range Status  07/29/2022 06:23 AM 6.3 (H) 4.8 - 5.6 % Final    Comment:    (NOTE) Pre diabetes:          5.7%-6.4%  Diabetes:              >6.4%  Glycemic control for   <7.0% adults with diabetes     CBG: Recent Labs  Lab 08/11/22 0028 08/11/22 0721 08/11/22 1141  GLUCAP 138* 109* 109*      Past Medical History:  She,  has a past medical history of Bundle branch block left (10/2010 she), Chest pain, hysterectomy, Hypertension, and Migraine.   Surgical History:   Past Surgical History:  Procedure Laterality Date   ABDOMINAL HYSTERECTOMY     COLONOSCOPY N/A 06/11/2016   Procedure: COLONOSCOPY;   Surgeon: Rogene Houston, MD;  Location: [facility];  Service: Endoscopy;  Laterality: N/A;  2:00 - moved to 1:00 - Ann notified pt   LAPAROSCOPIC CHOLECYSTECTOMY  2005   pain.   ROTATOR CUFF REPAIR  2009   arthroscopic   TUBAL  LIGATION     bilateral     Social History:   reports that she has never smoked. She has never used smokeless tobacco. She reports that she does not drink alcohol and does not use drugs.   Family History:  Her family history includes Heart attack in her father; Stroke in her mother.   Allergies No Known Allergies   Home Medications  Prior to Admission medications   Medication Sig Start Date End Date Taking? Authorizing Provider  aspirin EC 81 MG tablet Take 1 tablet (81 mg total) by mouth daily. Swallow whole. 08/05/22  Yes Barton Dubois, MD  atorvastatin (LIPITOR) 40 MG tablet Take 40 mg by mouth daily. 12/12/21  Yes [provider]  carvedilol (COREG) 3.125 MG tablet Take 1 tablet (3.125 mg total) by mouth 2 (two) times daily with a meal. 08/04/22  Yes Barton Dubois, MD  dicyclomine (BENTYL) 20 MG tablet Take 20 mg by mouth 4 (four) times daily as needed for spasms. 06/24/22  Yes [provider]  empagliflozin (JARDIANCE) 10 MG TABS tablet Take 1 tablet (10 mg total) by mouth daily. 08/05/22  Yes Barton Dubois, MD  furosemide (LASIX) 40 MG tablet Take 1 tablet (40 mg total) by mouth daily. 08/05/22  Yes Barton Dubois, MD  losartan (COZAAR) 25 MG tablet Take 1 tablet (25 mg total) by mouth daily. 08/04/22  Yes Barton Dubois, MD  pantoprazole (PROTONIX) 40 MG tablet Take 1 tablet (40 mg total) by mouth daily. 08/05/22  Yes Barton Dubois, MD  Saccharomyces boulardii (PROBIOTIC) 250 MG CAPS Take 1 capsule by mouth daily. 06/24/22  Yes [provider]  spironolactone (ALDACTONE) 25 MG tablet Take 0.5 tablets (12.5 mg total) by mouth daily. 08/05/22  Yes Barton Dubois, MD     Imp/Rec  1)  Acute PE / dvt p admit with resp failure/  vent dependent probably complicated by aspiration/hcap    2) L chest wall pain is likely from severe coughing fits/ rib injury and not directly related to small L PE but hard to be sure as also has small effusion with pleuritis likely  >>> control cough with opioids short term, gabapentin may be needed long term >>> control pain as above plus perhaps steroids is ESR up as probably should avoid nsaids here.   3) Asp vs HCAP both lower lobes > rx maxepime/vanc pending cultures.    [name], MD [facility] [place] [phone]   After 7:00 pm call Elink  [phone]

## 2022-08-12 NOTE — Progress Notes (Signed)
Responded to nursing call:   Uncontrolled left chest pain I arrived in room to assess patient.  She was asleep when I entered. Aroused easily to volic Subjective: When ask about her chest pain--she states "I'm ok right now" States pain is 4-5 Denies worsen sob. Denies n/v  Vitals:   08/11/22 2038 08/12/22 0435 08/12/22 1318 08/12/22 1536  BP: 125/85 133/81 (!) 109/57 119/66  Pulse: 91 85 83 81  Resp: 16 20 17 18   Temp: (!) 96.9 F (36.1 C) (!) 96.9 F (36.1 C) 98.8 F (37.1 C) 98.3 F (36.8 C)  TempSrc:   Oral   SpO2: 92% 93% 99% 90%  Weight:      Height:      98.3-84-18-119/66 CV--RRR Lung--bibasilar rales Abd--soft+BS/NT   Assessment/Plan: Pleuritic chest pain -pt seems comfortable presently -change morphine to IV dilaudid for next dose -continue hycodan -daughter updated at bedside     Orson Eva, DO [facility]

## 2022-08-13 DIAGNOSIS — I5022 Chronic systolic (congestive) heart failure: Secondary | ICD-10-CM | POA: Diagnosis not present

## 2022-08-13 DIAGNOSIS — I2699 Other pulmonary embolism without acute cor pulmonale: Secondary | ICD-10-CM | POA: Diagnosis not present

## 2022-08-13 DIAGNOSIS — J9611 Chronic respiratory failure with hypoxia: Secondary | ICD-10-CM | POA: Diagnosis not present

## 2022-08-13 DIAGNOSIS — J9601 Acute respiratory failure with hypoxia: Secondary | ICD-10-CM | POA: Diagnosis not present

## 2022-08-13 LAB — BASIC METABOLIC PANEL
Anion gap: 8 (ref 5–15)
BUN: 29 mg/dL — ABNORMAL HIGH (ref 8–23)
CO2: 23 mmol/L (ref 22–32)
Calcium: 9.9 mg/dL (ref 8.9–10.3)
Chloride: 107 mmol/L (ref 98–111)
Creatinine, Ser: 1.17 mg/dL — ABNORMAL HIGH (ref 0.44–1.00)
GFR, Estimated: 50 mL/min — ABNORMAL LOW (ref >60)
Glucose, Bld: 157 mg/dL — ABNORMAL HIGH (ref 70–99)
Potassium: 4 mmol/L (ref 3.5–5.1)
Sodium: 138 mmol/L (ref 135–145)

## 2022-08-13 LAB — CBC
HCT: 32.7 % — ABNORMAL LOW (ref 36.0–46.0)
Hemoglobin: 10.6 g/dL — ABNORMAL LOW (ref 12.0–15.0)
MCH: 28.2 pg (ref 26.0–34.0)
MCHC: 32.4 g/dL (ref 30.0–36.0)
MCV: 87 fL (ref 80.0–100.0)
Platelets: 403 10*3/uL — ABNORMAL HIGH (ref 150–400)
RBC: 3.76 MIL/uL — ABNORMAL LOW (ref 3.87–5.11)
RDW: 12.8 % (ref 11.5–15.5)
WBC: 19.6 10*3/uL — ABNORMAL HIGH (ref 4.0–10.5)
nRBC: 0 % (ref 0.0–0.2)

## 2022-08-13 LAB — HEPARIN LEVEL (UNFRACTIONATED): Heparin Unfractionated: 0.24 IU/mL — ABNORMAL LOW (ref 0.30–0.70)

## 2022-08-13 LAB — SEDIMENTATION RATE: Sed Rate: 45 mm/hr — ABNORMAL HIGH (ref 0–22)

## 2022-08-13 LAB — GLUCOSE, CAPILLARY: Glucose-Capillary: 109 mg/dL — ABNORMAL HIGH (ref 70–99)

## 2022-08-13 MED ORDER — APIXABAN 5 MG PO TABS: 5.0000 mg | ORAL_TABLET | Freq: Two times a day (BID) | ORAL | Status: DC

## 2022-08-13 MED ORDER — APIXABAN 5 MG PO TABS
10.0000 mg | ORAL_TABLET | Freq: Two times a day (BID) | ORAL | Status: DC
  Administered 2022-08-13 – 2022-08-15 (×5): 10 mg via ORAL
  Filled 2022-08-13 (×5): qty 2

## 2022-08-13 NOTE — Progress Notes (Signed)
ANTICOAGULATION CONSULT NOTE -   Pharmacy Consult for Heparin  Indication: pulmonary embolus  No Known Allergies  Patient Measurements: Height: 5\' 4"  (162.6 cm) Weight: 65.3 kg (144 lb) IBW/kg (Calculated) : 54.7  Vital Signs: Temp: 99.1 F (37.3 C) (10/25 0451) Temp Source: Oral (10/25 0451) BP: 129/63 (10/25 0451) Pulse Rate: 87 (10/25 0451)  Labs: Recent Labs    08/10/22 1856 08/10/22 2153 08/11/22 0343 08/11/22 0906 08/11/22 1521 08/12/22 0515 08/13/22 0444 08/13/22 0445  HGB 12.3  --  12.2  --   --  10.6* 10.6*  --   HCT 37.2  --  37.5  --   --  33.0* 32.7*  --   PLT 450*  --  398  --   --  398 403*  --   LABPROT 14.3  --   --   --   --   --   --   --   INR 1.1  --   --   --   --   --   --   --   HEPARINUNFRC  --   --   --    < > 0.53 0.35  --  0.24*  CREATININE 1.20*  --  0.94  --   --  1.31* 1.17*  --   TROPONINIHS  --  14  --   --   --   --   --   --    < > = values in this interval not displayed.     Estimated Creatinine Clearance: 38.6 mL/min (A) (by C-G formula based on SCr of 1.17 mg/dL (H)).   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 Found to have a PE via CT angio. Transitioning from heparin to apixaban  Goal of Therapy:  Heparin level 0.3-0.7 units/ml Monitor platelets by anticoagulation protocol: Yes   Plan:  Stop heparin infusion Start apixaban 10 mg twice daily x 7 days followed by apixaban 5 mg twice daily. Monitor for bleeding   Margot Ables, PharmD Clinical Pharmacist 08/13/2022 8:00 AM

## 2022-08-13 NOTE — Care Management Important Message (Signed)
Important Message  Patient Details  Name: Summer Christensen MRN: [id number] Date of Birth: Mar 17, 1951   Medicare Important Message Given:  N/A - LOS <3 / Initial given by admissions     Tommy Medal 08/13/2022, 4:15 PM

## 2022-08-13 NOTE — Evaluation (Signed)
Physical Therapy Evaluation Patient Details Name: Summer Christensen MRN: [id number] DOB: September 13, 1951 Today's Date: 08/13/2022  History of Present Illness  71 year old female with a history of hypertension, hyperlipidemia, LBBB, systolic CHF, diabetes mellitus type 2 presenting with pleuritic left chest pain. Admitted 10/22 after discovered to have acute pulmonary embolism, Lobar pneumonia/necrotizing pneumonia.  Clinical Impression  Pt admitted with above diagnosis. Demonstrates ability to ambulate with RW at supervision level. SpO2 85-88% on RA, up to 95% on 2L supplemental o2 and rest. Was receiving HHPT prior to this admission and would benefit to re-establish HHPT again upon d/c.  Pt currently with functional limitations due to the deficits listed below (see PT Problem List). Pt will benefit from skilled PT to increase their independence and safety with mobility to allow discharge to the venue listed below.          Recommendations for follow up therapy are one component of a multi-disciplinary discharge planning process, led by the attending physician.  Recommendations may be updated based on patient status, additional functional criteria and insurance authorization.  Follow Up Recommendations Home health PT      Assistance Recommended at Discharge Set up Supervision/Assistance  Patient can return home with the following  A little help with walking and/or transfers;A little help with bathing/dressing/bathroom;Assistance with cooking/housework;Assist for transportation;Help with stairs or ramp for entrance    Equipment Recommendations Rolling walker (2 wheels)  Recommendations for Other Services       Functional Status Assessment Patient has had a recent decline in their functional status and demonstrates the ability to make significant improvements in function in a reasonable and predictable amount of time.     Precautions / Restrictions Precautions Precautions: Fall Precaution  Comments: monitor O2 Restrictions Weight Bearing Restrictions: No      Mobility  Bed Mobility               General bed mobility comments: in recliner    Transfers Overall transfer level: Needs assistance Equipment used: Rolling walker (2 wheels), None Transfers: Sit to/from Stand Sit to Stand: Supervision           General transfer comment: supervision for safety, minor instability without AD. Improved stability with use of RW for support upon rising. Good control with descent.    Ambulation/Gait Ambulation/Gait assistance: Supervision Gait Distance (Feet): 80 Feet Assistive device: Rolling walker (2 wheels) Gait Pattern/deviations: Step-through pattern Gait velocity: decreased Gait velocity interpretation: <1.8 ft/sec, indicate of risk for recurrent falls   General Gait Details: Slower gait speed, bracing Lt flank for comfort. Good RW control. No buckling or overt LOB noted during bout. SpO2 85-88% on RA. Improves to 95% with 2L supplemental O2. Mild dyspnea.  Stairs            Wheelchair Mobility    Modified Rankin (Stroke Patients Only)       Balance Overall balance assessment: Needs assistance Sitting-balance support: No upper extremity supported Sitting balance-Leahy Scale: Good     Standing balance support: During functional activity, No upper extremity supported Standing balance-Leahy Scale: Fair                               Pertinent Vitals/Pain Pain Assessment Pain Assessment: 0-10 Pain Score: 8  Pain Location: left lower chest Pain Descriptors / Indicators: Guarding, Discomfort Pain Intervention(s): Monitored during session, Repositioned    Home Living Family/patient expects to be discharged to:: Private residence Living Arrangements: Children  Available Help at Discharge: Family;Available 24 hours/day Type of Home: House Home Access: Stairs to enter Entrance Stairs-Rails: None Entrance Stairs-Number of Steps: 2    Home Layout: One level Home Equipment: Conservation officer, nature (2 wheels)      Prior Function Prior Level of Function : Independent/Modified Independent             Mobility Comments: very active. able to mow lawn prior to recent admissions ADLs Comments: Independent     Hand Dominance        Extremity/Trunk Assessment   Upper Extremity Assessment Upper Extremity Assessment: Defer to OT evaluation    Lower Extremity Assessment Lower Extremity Assessment: Generalized weakness       Communication   Communication: No difficulties  Cognition Arousal/Alertness: Awake/alert Behavior During Therapy: WFL for tasks assessed/performed Overall Cognitive Status: Within Functional Limits for tasks assessed                                          General Comments General comments (skin integrity, edema, etc.): SpO2 at rest on 2L supplemental 02 95%. 85-88% on RA with mobility.    Exercises     Assessment/Plan    PT Assessment Patient needs continued PT services  PT Problem List Decreased strength;Decreased activity tolerance;Decreased balance;Decreased mobility;Cardiopulmonary status limiting activity;Pain       PT Treatment Interventions DME instruction;Gait training;Stair training;Functional mobility training;Therapeutic activities;Therapeutic exercise;Balance training;Patient/family education    PT Goals (Current goals can be found in the Care Plan section)  Acute Rehab PT Goals Patient Stated Goal: return home with family to assist PT Goal Formulation: With patient Time For Goal Achievement: 08/27/22 Potential to Achieve Goals: Good    Frequency Min 3X/week     Co-evaluation               AM-PAC PT "6 Clicks" Mobility  Outcome Measure Help needed turning from your back to your side while in a flat bed without using bedrails?: None Help needed moving from lying on your back to sitting on the side of a flat bed without using bedrails?:  None Help needed moving to and from a bed to a chair (including a wheelchair)?: A Little Help needed standing up from a chair using your arms (e.g., wheelchair or bedside chair)?: A Little Help needed to walk in hospital room?: A Little Help needed climbing 3-5 steps with a railing? : A Little 6 Click Score: 20    End of Session Equipment Utilized During Treatment: Oxygen Activity Tolerance: Patient tolerated treatment well Patient left: in chair;with call bell/phone within reach Nurse Communication: Mobility status PT Visit Diagnosis: Other abnormalities of gait and mobility (R26.89);Muscle weakness (generalized) (M62.81);Pain;Difficulty in walking, not elsewhere classified (R26.2) Pain - part of body:  (chest)    Time: 2585-2778 PT Time Calculation (min) (ACUTE ONLY): 26 min   Charges:   PT Evaluation $PT Eval Low Complexity: 1 Low PT Treatments $Gait Training: 8-22 mins        Candie Mile, PT, DPT Physical Therapist Acute Rehabilitation Services [place] 08/13/2022, 11:45 AM

## 2022-08-13 NOTE — Progress Notes (Signed)
PROGRESS NOTE  Summer Christensen XBJ:[id number] DOB: Sep 03, 1951 DOA: 08/10/2022 PCP: Smith Robert, MD  Brief History:  71 year old female with a history of hypertension, hyperlipidemia, LBBB, systolic CHF, diabetes mellitus type 2 presenting with pleuritic left chest pain that began on 08/06/2022 and has progressively worsened over the past 3 days prior to admission.  Notably, the patient was recently hospitalized from 07/29/2022 to 08/04/2022.  During that hospital admission, the patient had respiratory failure and was on mechanical ventilation.  Her Respiratory failure was multifactorial including systolic CHF (EF <30%) and pneumonia.  She was initially treated with Zosyn and azithromycin.  She was discharged home with Augmentin.  Regarding her CHF, she was seen by cardiology, and the patient was started on carvedilol, Farxiga, spironolactone, and losartan. Since returning home, the patient states that she had remained somewhat short of breath but not any worsening.  She denies any worsening peripheral edema.  She denies any fevers, chills, nausea, vomiting or diarrhea abdominal pain.  She had a worsening cough without hemoptysis.  This resulted in her left pleuritic chest pain which prompted her return to the emergency department. In the ED, the patient had a low-grade temperature of 99.3.  She was initially tachycardic up to 130s.  She was hemodynamically stable.  Oxygen saturation is 98% on 2 L. CTA chest showed a small LUL pulmonary embolus.  There was RLL opacity with signs of early cavitation.  There is improving LLL infiltrate.  The patient was started on IV heparin.  WBC 20.0, hemoglobin 12.3, platelets 456,000.  Sodium 137, potassium 4.0, bicarbonate 25, serum creatinine 0.94.  UA showed 6-10 WBC.  Lactic acid 1.8>> 1.4.  EKG showed sinus tachycardia with left bundle branch block.   Assessment/Plan: Acute pulmonary embolus / DVT Right leg  -CTA chest as described  above -10/23-Limited echo EF 20-25%, global HK, normal RVF, +IV septum flattening -Continue IV heparin with plans to transition to apixaban on 10/25 -Continue low dose IV hydromorphine every 3 hours as needed for pleuritic chest pain -venous duplex: Positive for deep vein thrombosis in the right calf veins and in the left popliteal and peroneal veins - added TED hoses   Lobar pneumonia/necrotizing pneumonia? -08/10/2022 CTA chest--POS PE, RLL opacity with early cavitation -Continue cefepime; DC vanc with neg MRSA screen -07/29/2022 CTA chest--bilateral LL consolidation, L>R; bilateral centrilobular GGO -Check PCT <0.10 -WBC up to 19 -pulmonary consult appreciated    Pleuritic chest pain -Morphine as needed as discussed above -hycodan prn cough   Chronic HFrEF -07/29/2022 echo EF <20%, global HK, grade 3 DD, mild to moderate MR -Continue home dose furosemide -Continue carvedilol, losartan, Jardiance -holding spironolactone temporarily due to uptrend K -presently appears euvolemic  Chronic respiratory failure with hypoxia -sent home on 2L on 08/04/22 -currently stable on 2L -may need to increase to 3-4 L with activity due to long cord at home   Impaired glucose tolerance -07/29/2022 hemoglobin A1c 6.3 -Lifestyle modification   Aortic atherosclerosis -hold statin due to elevated LFTs   Transaminasemia -likely due to hemodynamic changes -RUQ US--hepatic steatosis -no abd pain -trend LFTs>>improving --check COVID -19--neg     Family Communication:   daughter updated 10/24   Consultants:  none   Code Status:  FULL    DVT Prophylaxis:  IV Heparin ---> apixaban >>     Subjective: Reports chest pain symptoms seem much better today.  No SOB.  No bleeding.  No blood in stool or  melena.    Objective: Vitals:   08/12/22 1536 08/12/22 1955 08/13/22 0451 08/13/22 1342  BP: 119/66 117/72 129/63 (!) 112/56  Pulse: 81 76 87 85  Resp: 18 18 20 18   Temp: 98.3 F (36.8 C)  98.4 F (36.9 C) 99.1 F (37.3 C) 99.5 F (37.5 C)  TempSrc:  Oral Oral Oral  SpO2: 90% 93% 95% 96%  Weight:      Height:        Intake/Output Summary (Last 24 hours) at 08/13/2022 1348 Last data filed at 08/13/2022 0900 Gross per 24 hour  Intake 816.29 ml  Output 1500 ml  Net -683.71 ml   Weight change:  Exam:  General:  Pt is alert, follows commands appropriately, not in acute distress HEENT: No icterus, No thrush, No neck mass, [place]/AT Cardiovascular: RRR, S1/S2, no rubs, no gallops Respiratory: bibasilar crackles. No wheeze Abdomen: Soft/+BS, non tender, non distended, no guarding Extremities: No edema, No lymphangitis, No petechiae, No rashes, no synovitis  Data Reviewed: I have personally reviewed following labs and imaging studies Basic Metabolic Panel: Recent Labs  Lab 08/10/22 1856 08/11/22 0343 08/12/22 0515 08/13/22 0444  NA 136 137 137 138  K 3.7 4.0 4.5 4.0  CL 102 102 103 107  CO2 24 25 25 23   GLUCOSE 151* 141* 132* 157*  BUN 19 18 23  29*  CREATININE 1.20* 0.94 1.31* 1.17*  CALCIUM 9.9 9.4 9.7 9.9  MG  --  2.3  --   --   PHOS  --  4.3  --   --    Liver Function Tests: Recent Labs  Lab 08/10/22 1856 08/11/22 0343 08/12/22 0515  AST 20 247* 74*  ALT 33 107* 101*  ALKPHOS 90 126 130*  BILITOT 0.5 0.8 0.7  PROT 8.4* 7.0 6.8  ALBUMIN 3.9 3.3* 3.0*   No results for input(s): "LIPASE", "AMYLASE" in the last 168 hours. No results for input(s): "AMMONIA" in the last 168 hours. Coagulation Profile: Recent Labs  Lab 08/10/22 1856  INR 1.1   CBC: Recent Labs  Lab 08/10/22 1856 08/11/22 0343 08/12/22 0515 08/13/22 0444  WBC 20.0* 19.5* 22.2* 19.6*  NEUTROABS 15.9*  --   --   --   HGB 12.3 12.2 10.6* 10.6*  HCT 37.2 37.5 33.0* 32.7*  MCV 84.9 86.2 87.3 87.0  PLT 450* 398 398 403*   Cardiac Enzymes: No results for input(s): "CKTOTAL", "CKMB", "CKMBINDEX", "TROPONINI" in the last 168 hours. BNP: Invalid input(s): "POCBNP" CBG: Recent  Labs  Lab 08/11/22 0028 08/11/22 0721 08/11/22 1141  GLUCAP 138* 109* 109*   HbA1C: No results for input(s): "HGBA1C" in the last 72 hours. Urine analysis:    Component Value Date/Time   COLORURINE YELLOW 08/11/2022 0545   APPEARANCEUR CLEAR 08/11/2022 0545   APPEARANCEUR Clear 11/20/2017 1200   LABSPEC 1.029 08/11/2022 0545   PHURINE 6.0 08/11/2022 0545   GLUCOSEU >=500 (A) 08/11/2022 0545   HGBUR NEGATIVE 08/11/2022 0545   BILIRUBINUR NEGATIVE 08/11/2022 0545   BILIRUBINUR Negative 11/20/2017 1200   KETONESUR NEGATIVE 08/11/2022 0545   PROTEINUR NEGATIVE 08/11/2022 0545   UROBILINOGEN 0.2 11/14/2010 1850   NITRITE NEGATIVE 08/11/2022 0545   LEUKOCYTESUR TRACE (A) 08/11/2022 0545    Recent Results (from the past 240 hour(s))  Culture, blood (Routine x 2)     Status: None (Preliminary result)   Collection Time: 08/10/22  6:57 PM   Specimen: Left Antecubital; Blood  Result Value Ref Range Status   Specimen Description LEFT ANTECUBITAL  Final   Special Requests   Final    BOTTLES DRAWN AEROBIC AND ANAEROBIC Blood Culture adequate volume   Culture   Final    NO GROWTH 3 DAYS Performed at [facility], [address], [place], [place] [address]    Report Status PENDING  Incomplete  Culture, blood (Routine x 2)     Status: None (Preliminary result)   Collection Time: 08/10/22  7:09 PM   Specimen: BLOOD RIGHT FOREARM  Result Value Ref Range Status   Specimen Description BLOOD RIGHT FOREARM  Final   Special Requests   Final    BOTTLES DRAWN AEROBIC AND ANAEROBIC Blood Culture adequate volume   Culture   Final    NO GROWTH 3 DAYS Performed at [facility], [address], [place], [place] [address]    Report Status PENDING  Incomplete  SARS Coronavirus 2 by RT PCR (hospital order, performed in [facility] hospital lab) *cepheid single result test* Anterior Nasal Swab     Status: None   Collection Time: 08/11/22  8:06 AM   Specimen: Anterior Nasal Swab  Result Value Ref  Range Status   SARS Coronavirus 2 by RT PCR NEGATIVE NEGATIVE Final    Comment: (NOTE) SARS-CoV-2 target nucleic acids are NOT DETECTED.  The SARS-CoV-2 RNA is generally detectable in upper and lower respiratory specimens during the acute phase of infection. The lowest concentration of SARS-CoV-2 viral copies this assay can detect is 250 copies / mL. A negative result does not preclude SARS-CoV-2 infection and should not be used as the sole basis for treatment or other patient management decisions.  A negative result may occur with improper specimen collection / handling, submission of specimen other than nasopharyngeal swab, presence of viral mutation(s) within the areas targeted by this assay, and inadequate number of viral copies (<250 copies / mL). A negative result must be combined with clinical observations, patient history, and epidemiological information.  Fact Sheet for Patients:   RoadLapTop.co.za  Fact Sheet for Healthcare Providers: http://kim-miller.com/  This test is not yet approved or  cleared by the [place] FDA and has been authorized for detection and/or diagnosis of SARS-CoV-2 by FDA under an Emergency Use Authorization (EUA).  This EUA will remain in effect (meaning this test can be used) for the duration of the COVID-19 declaration under Section 564(b)(1) of the Act, 21 U.S.C. section 360bbb-3(b)(1), unless the authorization is terminated or revoked sooner.  Performed at [facility], [address] [place], [place] [address]   MRSA Next Gen by PCR, Nasal     Status: None   Collection Time: 08/12/22 12:09 PM   Specimen: Nasal Mucosa; Nasal Swab  Result Value Ref Range Status   MRSA by PCR Next Gen NOT DETECTED NOT DETECTED Final    Comment: (NOTE) The GeneXpert MRSA Assay (FDA approved for NASAL specimens only), is one component of a comprehensive MRSA colonization surveillance program. It is not intended  to diagnose MRSA infection nor to guide or monitor treatment for MRSA infections. Test performance is not FDA approved in patients less than 68 years old. Performed at [facility], [address] [place], [place] [address]      Scheduled Meds:  apixaban  10 mg Oral BID   Followed by   Melene Muller ON 08/20/2022] apixaban  5 mg Oral BID   aspirin EC  81 mg Oral Daily   atorvastatin  40 mg Oral Daily   carvedilol  3.125 mg Oral BID WC   dextromethorphan-guaiFENesin  2 tablet Oral BID   empagliflozin  10 mg Oral Daily   feeding supplement  237 mL Oral TID BM   furosemide  40 mg Oral Daily   gabapentin  100 mg Oral TID   lidocaine  1 patch Transdermal Q24H   losartan  25 mg Oral Daily   multivitamin with minerals  1 tablet Oral Daily   pantoprazole  40 mg Oral BID AC   Continuous Infusions:  ceFEPime (MAXIPIME) IV 2 g (08/13/22 0934)    Procedures/Studies: ECHOCARDIOGRAM LIMITED  Result Date: 08/11/2022    ECHOCARDIOGRAM LIMITED REPORT   Patient Name:   Summer Christensen Hopkins Date of Exam: 08/11/2022 Medical Rec #:  [id number]     Height:       64.0 in Accession #:    [id number]    Weight:       144.0 lb Date of Birth:  18-Jun-1951     BSA:          1.701 m Patient Age:    70 years      BP:           127/62 mmHg Patient Gender: F             HR:           102 bpm. [facility] Procedure: Limited Echo and Limited Color Doppler Indications:    Pulmonary embolus  History:        Patient has prior history of Echocardiogram examinations, most                 recent 07/29/2022. CHF, Arrythmias:LBBB, Signs/Symptoms:Chest                 Pain; Risk Factors:Hypertension and Diabetes. Pulmonary embolus.  Sonographer:    Mikki Harbor Referring Phys: [id number] OLADAPO ADEFESO IMPRESSIONS  1. Global hypokinesis with anteroseptal akinesis.. Left ventricular ejection fraction, by estimation, is 20 to 25%. The left ventricle has severely decreased function. The left ventricle demonstrates global  hypokinesis. The left ventricular internal cavity size was moderately dilated.  2. Systolic flattening of the ventricular septum suggests RV pressure overload. . Right ventricular systolic function is normal. The right ventricular size is normal. There is mildly elevated pulmonary artery systolic pressure.  3. The tricuspid valve is abnormal. Tricuspid valve regurgitation is mild.  4. The inferior vena cava is normal in size with greater than 50% respiratory variability, suggesting right atrial pressure of 3 mmHg.  5. Limited echo to evaluate RV function FINDINGS  Left Ventricle: Global hypokinesis with anteroseptal akinesis. Left ventricular ejection fraction, by estimation, is 20 to 25%. The left ventricle has severely decreased function. The left ventricle demonstrates global hypokinesis. The left ventricular internal cavity size was moderately dilated. There is no left ventricular hypertrophy. Right Ventricle: Systolic flattening of the ventricular septum suggests RV pressure overload. The right ventricular size is normal. Right vetricular wall thickness was not well visualized. Right ventricular systolic function is normal. There is mildly elevated pulmonary artery systolic pressure. The tricuspid regurgitant velocity is 3.12 m/s, and with an assumed right atrial pressure of 3 mmHg, the estimated right ventricular systolic pressure is 41.9 mmHg. Pericardium: There is no evidence of pericardial effusion. Tricuspid Valve: The tricuspid valve is abnormal. Tricuspid valve regurgitation is mild . No evidence of tricuspid stenosis. Aorta: The aortic root is normal in size and structure. Venous: The inferior vena cava is normal in size with greater than 50% respiratory variability, suggesting right atrial pressure  of 3 mmHg. LEFT VENTRICLE PLAX 2D LVIDd:         6.60 cm LVIDs:         5.60 cm LV PW:         0.80 cm LV IVS:        1.00 cm LVOT diam:     2.00 cm LVOT Area:     3.14 cm  LV Volumes (MOD) LV vol d, MOD  A2C: 151.0 ml LV vol d, MOD A4C: 145.0 ml LV vol s, MOD A2C: 102.0 ml LV vol s, MOD A4C: 126.0 ml LV SV MOD A2C:     49.0 ml LV SV MOD A4C:     145.0 ml LV SV MOD BP:      34.0 ml RIGHT VENTRICLE RV Basal diam:  3.00 cm RV Mid diam:    2.10 cm RV S prime:     15.60 cm/s LEFT ATRIUM           Index        RIGHT ATRIUM           Index LA diam:      4.60 cm 2.70 cm/m   RA Area:     10.50 cm LA Vol (A2C): 42.1 ml 24.74 ml/m  RA Volume:   19.70 ml  11.58 ml/m LA Vol (A4C): 59.1 ml 34.74 ml/m   AORTA Ao Root diam: 3.00 cm TRICUSPID VALVE TR Peak grad:   38.9 mmHg TR Vmax:        312.00 cm/s  SHUNTS Systemic Diam: 2.00 cm Dina Rich MD Electronically signed by Dina Rich MD Signature Date/Time: 08/11/2022/1:02:42 PM    Final    US Venous Img Lower Bilateral (DVT)  Result Date: 08/11/2022 CLINICAL DATA:  Pulmonary embolism. EXAM: BILATERAL LOWER EXTREMITY VENOUS DOPPLER ULTRASOUND TECHNIQUE: Gray-scale sonography with graded compression, as well as color Doppler and duplex ultrasound were performed to evaluate the lower extremity deep venous systems from the level of the common femoral vein and including the common femoral, femoral, profunda femoral, popliteal and calf veins including the posterior tibial, peroneal and gastrocnemius veins when visible. The superficial great saphenous vein was also interrogated. Spectral Doppler was utilized to evaluate flow at rest and with distal augmentation maneuvers in the common femoral, femoral and popliteal veins. COMPARISON:  None Available. FINDINGS: RIGHT LOWER EXTREMITY Common Femoral Vein: No evidence of thrombus. Normal compressibility, respiratory phasicity and response to augmentation. Saphenofemoral Junction: No evidence of thrombus. Normal compressibility and flow on color Doppler imaging. Profunda Femoral Vein: No evidence of thrombus. Normal compressibility and flow on color Doppler imaging. Femoral Vein: No evidence of thrombus. Normal  compressibility, respiratory phasicity and response to augmentation. Popliteal Vein: No evidence of thrombus. Normal compressibility, respiratory phasicity and response to augmentation. Calf Veins: Thrombus visualized in the posterior tibial and peroneal veins in the calf. Superficial Great Saphenous Vein: Superficial thrombophlebitis of the great saphenous vein at the level of the knee extending into some communicating superficial varicosities. Venous Reflux:  None. Other Findings:  None. LEFT LOWER EXTREMITY Common Femoral Vein: No evidence of thrombus. Normal compressibility, respiratory phasicity and response to augmentation. Saphenofemoral Junction: No evidence of thrombus. Normal compressibility and flow on color Doppler imaging. Profunda Femoral Vein: No evidence of thrombus. Normal compressibility and flow on color Doppler imaging. Femoral Vein: No evidence of thrombus. Normal compressibility, respiratory phasicity and response to augmentation. Popliteal Vein: Nonocclusive thrombus in the left popliteal vein. Calf Veins: Thrombus visualized in the left peroneal vein.  Posterior tibial and anterior tibial veins demonstrate normal patency. Superficial Great Saphenous Vein: No evidence of thrombus. Normal compressibility. Venous Reflux:  None. Other Findings: No evidence of superficial thrombophlebitis or abnormal fluid collection. IMPRESSION: 1. Positive for deep vein thrombosis in the right calf veins and in the left popliteal and peroneal veins. 2. Superficial thrombophlebitis of the right great saphenous vein at the level of the knee extending into some communicating superficial varicosities. Electronically Signed   By: Irish Lack M.D.   On: 08/11/2022 08:53   US Abdomen Limited RUQ (LIVER/GB)  Result Date: 08/11/2022 CLINICAL DATA:  Transaminasemia. Status post cholecystectomy and around 2005. EXAM: ULTRASOUND ABDOMEN LIMITED RIGHT UPPER QUADRANT COMPARISON:  CT chest, abdomen, and pelvis  07/29/2022 FINDINGS: Gallbladder: The gallbladder is surgically absent. Common bile duct: Diameter: 2 mm, within normal limits. No intrahepatic or extrahepatic biliary ductal dilatation. Liver: Smooth liver contours. Mildly increased hepatic echogenicity. Mildly heterogeneous hepatic echotexture. Portal vein is patent on color Doppler imaging with normal direction of blood flow towards the liver. Other: None. IMPRESSION: 1. Mildly increased hepatic echogenicity, most compatible with hepatic steatosis. Low-density suggesting hepatic steatosis was also seen on recent CT. 2. Status post cholecystectomy. No biliary ductal dilatation. Electronically Signed   By: Neita Garnet M.D.   On: 08/11/2022 08:51   CT Angio Chest PE W/Cm &/Or Wo Cm  Result Date: 08/10/2022 CLINICAL DATA:  Concern for pulmonary embolism. EXAM: CT ANGIOGRAPHY CHEST WITH CONTRAST TECHNIQUE: Multidetector CT imaging of the chest was performed using the standard protocol during bolus administration of intravenous contrast. Multiplanar CT image reconstructions and MIPs were obtained to evaluate the vascular anatomy. RADIATION DOSE REDUCTION: This exam was performed according to the departmental dose-optimization program which includes automated exposure control, adjustment of the mA and/or kV according to patient size and/or use of iterative reconstruction technique. CONTRAST:  73mL OMNIPAQUE IOHEXOL 350 MG/ML SOLN COMPARISON:  Chest radiograph dated 08/10/2022 and CT dated 07/29/2022. FINDINGS: Evaluation of this exam is limited due to respiratory motion artifact. Cardiovascular: There is mild cardiomegaly with mild dilatation of the left cardiac chambers. No pericardial effusion. Mild atherosclerotic calcification of the thoracic aorta. No aneurysmal dilatation or dissection. Evaluation of the pulmonary arteries is limited due to respiratory motion. Small left upper lobe segmental pulmonary artery emboli noted. No CT evidence of right heart  straining. Mediastinum/Nodes: No hilar or mediastinal adenopathy. The esophagus is grossly unremarkable. No mediastinal fluid collection. Lungs/Pleura: Small left pleural effusion. There is left lung base partial compressive atelectasis or infiltrate. Overall improved aeration of the left lower lobe compared to prior CT. Clusters of airspace opacity in the right lower lobe most consistent with pneumonia. There may be areas of early cavitary change at the right lung base. Attention on follow-up imaging recommended. Small subpleural nodule in the superior segment of the left lower lobe, likely post inflammatory and residual from prior infiltration. There is a 5 mm nodule or para fissural lymph node along the left fissure (71/6). There is no pneumothorax. The central airways are patent. Upper Abdomen: Cholecystectomy. Musculoskeletal: Osteopenia with degenerative changes of the spine. No acute osseous pathology. Review of the MIP images confirms the above findings. IMPRESSION: 1. Small left upper lobe segmental pulmonary artery emboli. No CT evidence of right heart straining. 2. Right lower lobe pneumonia. There may be areas of early cavitary change at the right lung base. Attention on follow-up imaging recommended. 3. Small left pleural effusion with left lung base partial compressive atelectasis or infiltrate. Overall improved  aeration of the left lower lobe compared to prior CT. 4. Mild cardiomegaly with mild dilatation of the left cardiac chambers. 5. A 5 mm nodule or para fissural lymph node along the left fissure. 6.  Aortic Atherosclerosis (ICD10-I70.0). These results were called by telephone at the time of interpretation on 08/10/2022 at 10:43 pm to provider Alvino Blood , who verbally acknowledged these results. Electronically Signed   By: Elgie Collard M.D.   On: 08/10/2022 22:44   DG Chest 2 View  Result Date: 08/10/2022 CLINICAL DATA:  Suspected Sepsis EXAM: CHEST - 2 VIEW COMPARISON:  Chest  x-ray 08/01/2022, CT chest 07/29/2022 FINDINGS: The heart and mediastinal contours are unchanged. Question right lower lung zone airspace opacity versus hilar lymph node. Retrocardiac airspace opacity. No pulmonary edema. Small left pleural effusion. No pneumothorax. No acute osseous abnormality. IMPRESSION: 1. Small left pleural effusion. 2. Retrocardiac airspace opacity may represent a combination of infection/inflammation versus atelectasis. 3. Question right lower lung zone airspace opacity versus hilar lymph node. Electronically Signed   By: Tish Frederickson M.D.   On: 08/10/2022 19:10   DG Chest Port 1 View  Result Date: 08/01/2022 CLINICAL DATA:  Fever EXAM: PORTABLE CHEST 1 VIEW COMPARISON:  Chest radiograph 07/31/2022 FINDINGS: No pleural effusion. No pneumothorax. Normal lung volumes. Unchanged cardiac and mediastinal contours. Compared to prior exam there is slight interval increase in conspicuity of a retrocardiac opacity, which could represent atelectasis, but infection is not excluded. No displaced rib fractures. The visualized upper abdomen is unremarkable. IMPRESSION: Compared to prior exam there is slight interval increase in conspicuity of a retrocardiac opacity, which could represent atelectasis, but infection is not excluded. Electronically Signed   By: Lorenza Cambridge M.D.   On: 08/01/2022 09:49   DG Chest Port 1 View  Result Date: 07/31/2022 CLINICAL DATA:  Bibasilar crackles. EXAM: PORTABLE CHEST 1 VIEW COMPARISON:  07/31/2022 FINDINGS: Interval removal of enteric and endotracheal tubes. Mild cardiac enlargement. No vascular congestion or edema. Lung bases and costophrenic angles are clear. No pneumothorax. IMPRESSION: Cardiac enlargement.  Lungs appear clear postextubation. Electronically Signed   By: Burman Nieves M.D.   On: 07/31/2022 23:34   DG Chest Port 1 View  Result Date: 07/31/2022 CLINICAL DATA:  Acute respiratory failure EXAM: PORTABLE CHEST 1 VIEW COMPARISON:  Chest  07/30/2022 FINDINGS: Endotracheal tube 2.5 cm above the carina. NG tube in the stomach with the tip not visualized. Left lower lobe consolidation with mild progression. Mild right lower lobe airspace disease with progression. IMPRESSION: 1. Endotracheal tube in good position. 2. Progression of bibasilar airspace disease left greater than right. Electronically Signed   By: Marlan Palau M.D.   On: 07/31/2022 14:01   DG Chest Port 1 View  Result Date: 07/30/2022 CLINICAL DATA:  Acute on chronic respiratory failure with hypoxemia. EXAM: PORTABLE CHEST 1 VIEW COMPARISON:  07/29/2022 FINDINGS: ET tube tip is above the carina. Enteric tube tip courses below the level of the hemidiaphragms. Unchanged asymmetric elevation of the right hemidiaphragm. Persistent airspace disease within the left lower lobe which appears unchanged from the previous exam. Decreased interstitial edema pattern. IMPRESSION: 1. Stable support apparatus. 2. Persistent left lower lobe airspace disease. 3. Decreased interstitial edema pattern. Electronically Signed   By: Signa Kell M.D.   On: 07/30/2022 08:54   ECHOCARDIOGRAM COMPLETE  Result Date: 07/29/2022    ECHOCARDIOGRAM REPORT   Patient Name:   Summer Christensen [place] Date of Exam: 07/29/2022 Medical Rec #:  [id number]  Height:       64.0 in Accession #:    [id number]    Weight:       145.7 lb Date of Birth:  [birth year]     BSA:          1.710 m Patient Age:    70 years      BP:           102/69 mmHg Patient Gender: F             HR:           95 bpm. [facility] Procedure: 2D Echo, Color Doppler, Cardiac Doppler and Intracardiac            Opacification Agent Indications:    I50.21 Acute systolic (congestive) heart failure  History:        Patient has prior history of Echocardiogram examinations, most                 recent 01/08/2011. Arrythmias:LBBB; Risk Factors:Hypertension and                 Diabetes.  Sonographer:    Irving Burton Senior RDCS Referring Phys: [id number] OLADAPO  ADEFESO  Sonographer Comments: Scanned supine on artificial respirator. IMPRESSIONS  1. Left ventricular ejection fraction, by estimation, is <20%. The left ventricle has severely decreased function. The left ventricle demonstrates global hypokinesis. The left ventricular internal cavity size was moderately dilated. Left ventricular diastolic parameters are consistent with Grade III diastolic dysfunction (restrictive).  2. Right ventricular systolic function is normal. The right ventricular size is normal.  3. The mitral valve is normal in structure. Mild to moderate mitral valve regurgitation. No evidence of mitral stenosis.  4. The aortic valve is tricuspid. Aortic valve regurgitation is not visualized. No aortic stenosis is present.  5. The inferior vena cava is normal in size with greater than 50% respiratory variability, suggesting right atrial pressure of 3 mmHg. FINDINGS  Left Ventricle: Left ventricular ejection fraction, by estimation, is <20%. The left ventricle has severely decreased function. The left ventricle demonstrates global hypokinesis. Definity contrast agent was given IV to delineate the left ventricular endocardial borders. The left ventricular internal cavity size was moderately dilated. There is no left ventricular hypertrophy. Abnormal (paradoxical) septal motion, consistent with left bundle branch block. Left ventricular diastolic parameters are consistent with Grade III diastolic dysfunction (restrictive). Right Ventricle: The right ventricular size is normal. No increase in right ventricular wall thickness. Right ventricular systolic function is normal. Left Atrium: Left atrial size was normal in size. Right Atrium: Right atrial size was normal in size. Pericardium: There is no evidence of pericardial effusion. Mitral Valve: The mitral valve is normal in structure. Mild to moderate mitral valve regurgitation. No evidence of mitral valve stenosis. Tricuspid Valve: The tricuspid valve is  normal in structure. Tricuspid valve regurgitation is not demonstrated. No evidence of tricuspid stenosis. Aortic Valve: The aortic valve is tricuspid. Aortic valve regurgitation is not visualized. No aortic stenosis is present. Pulmonic Valve: The pulmonic valve was normal in structure. Pulmonic valve regurgitation is mild. No evidence of pulmonic stenosis. Aorta: The aortic root is normal in size and structure. Venous: The inferior vena cava is normal in size with greater than 50% respiratory variability, suggesting right atrial pressure of 3 mmHg. IAS/Shunts: No atrial level shunt detected by color flow Doppler.  LEFT VENTRICLE PLAX 2D LVIDd:         5.90 cm  Diastology LVIDs:         4.50 cm      LV e' medial:    4.90 cm/s LV PW:         1.00 cm      LV E/e' medial:  20.8 LV IVS:        0.90 cm      LV e' lateral:   7.94 cm/s LVOT diam:     1.90 cm      LV E/e' lateral: 12.8 LV SV:         30 LV SV Index:   18 LVOT Area:     2.84 cm  LV Volumes (MOD) LV vol d, MOD A2C: 131.0 ml LV vol d, MOD A4C: 114.0 ml LV vol s, MOD A2C: 114.0 ml LV vol s, MOD A4C: 102.0 ml LV SV MOD A2C:     17.0 ml LV SV MOD A4C:     114.0 ml LV SV MOD BP:      14.8 ml RIGHT VENTRICLE RV S prime:     9.03 cm/s TAPSE (M-mode): 1.9 cm LEFT ATRIUM             Index        RIGHT ATRIUM           Index LA diam:        4.00 cm 2.34 cm/m   RA Area:     12.70 cm LA Vol (A2C):   56.3 ml 32.92 ml/m  RA Volume:   26.30 ml  15.38 ml/m LA Vol (A4C):   39.7 ml 23.22 ml/m LA Biplane Vol: 48.8 ml 28.54 ml/m  AORTIC VALVE LVOT Vmax:   92.70 cm/s LVOT Vmean:  62.300 cm/s LVOT VTI:    0.106 m  AORTA Ao Root diam: 3.00 cm Ao Asc diam:  3.30 cm MITRAL VALVE MV Area (PHT): 5.50 cm     SHUNTS MV Decel Time: 138 msec     Systemic VTI:  0.11 m MV E velocity: 102.00 cm/s  Systemic Diam: 1.90 cm MV A velocity: 30.00 cm/s MV E/A ratio:  3.40 Donato Schultz MD Electronically signed by Donato Schultz MD Signature Date/Time: 07/29/2022/10:33:16 AM    Final    DG  Chest Portable 1 View  Result Date: 07/29/2022 CLINICAL DATA:  NG tube placement. EXAM: PORTABLE CHEST 1 VIEW COMPARISON:  07/29/2022 FINDINGS: 0451 hours. Endotracheal tube tip is 1.3 cm above the base of the carina. The NG tube passes into the stomach although the distal tip position is not included on the film. The cardio pericardial silhouette is enlarged. Vascular congestion noted with underlying chronic interstitial coarsening. Mild bibasilar atelectasis noted without substantial pleural effusion. Telemetry leads overlie the chest. IMPRESSION: Endotracheal tube tip is 1.3 cm above the base of the carina. NG tube passes into the stomach. Cardiomegaly with vascular congestion and basilar atelectasis. Electronically Signed   By: Kennith Center M.D.   On: 07/29/2022 05:30   CT Angio Chest/Abd/Pel for Dissection W and/or Wo Contrast  Addendum Date: 07/29/2022   ADDENDUM REPORT: 07/29/2022 05:09 ADDENDUM: ETT position and other salient findings discussed by telephone with Dr. Geoffery Lyons on 07/29/2022 at 0501 hours. Electronically Signed   By: Odessa Fleming M.D.   On: 07/29/2022 05:09   Result Date: 07/29/2022 CLINICAL DATA:  71 year old female unresponsive at home. Altered mental status. Intubated. EXAM: CT ANGIOGRAPHY CHEST, ABDOMEN AND PELVIS TECHNIQUE: Non-contrast CT of the chest was initially obtained. Multidetector CT imaging through the chest,  abdomen and pelvis was performed using the standard protocol during bolus administration of intravenous contrast. Multiplanar reconstructed images and MIPs were obtained and reviewed to evaluate the vascular anatomy. RADIATION DOSE REDUCTION: This exam was performed according to the departmental dose-optimization program which includes automated exposure control, adjustment of the mA and/or kV according to patient size and/or use of iterative reconstruction technique. CONTRAST:  192mL OMNIPAQUE IOHEXOL 350 MG/ML SOLN COMPARISON:  Portable chest 0347 hours today.  CTA chest 01/06/2011. CT Abdomen and Pelvis 12/25/2021. FINDINGS: CTA CHEST FINDINGS Cardiovascular: Calcified aortic atherosclerosis. Calcified coronary artery atherosclerosis. Mild cardiomegaly appears stable since March. No pericardial effusion. Following contrast there is no thoracic aorta dissection. Incidental cardiac pulsation artifact. No thoracic aortic aneurysm. Also, the central pulmonary arteries are enhancing and appear to be patent. Mediastinum/Nodes: Negative. Lungs/Pleura: Intubated, endotracheal tube tip terminates just at the right mainstem bronchus on series 10, image 72. Atelectatic changes to the airways at the carina. Dependent, confluent pulmonary consolidation in both lower lobes, greater on the left. Early dependent upper lobe involvement. Small pulmonary arterial branches appear to be enhancing in the areas of consolidation. Superimposed perihilar and centrilobular pulmonary ground-glass opacity in both lungs with some septal thickening. Trace layering pleural effusions. Musculoskeletal: Respiratory motion at the sternum and ribs. No acute osseous abnormality identified. Review of the MIP images confirms the above findings. CTA ABDOMEN AND PELVIS FINDINGS VASCULAR Mild Aortoiliac calcified atherosclerosis. Negative for abdominal aortic aneurysm or dissection. Major arterial structures are patent. Portal venous system also appears to be patent. Review of the MIP images confirms the above findings. NON-VASCULAR Hepatobiliary: Chronically absent gallbladder. Liver appears stable and negative when allowing for motion artifact. Pancreas: Stable, negative. Spleen: Within normal limits. Adrenals/Urinary Tract: Stable, negative adrenal glands. Asymmetric right renal cortical scarring and volume loss. Symmetric renal enhancement. Motion artifact. No convincing hydronephrosis or pararenal inflammation. Ureters appear decompressed. No obvious urinary calculus. Unremarkable bladder. Stomach/Bowel:  Intermittent motion artifact. Redundant but decompressed large bowel except for retained stool in the transverse colon. Normal retrocecal appendix on series 6, image 152. No dilated small bowel. Small volume of air in fluid in the stomach. Negative duodenum. No free air, free fluid, or mesenteric inflammation identified. Lymphatic: No lymphadenopathy. Reproductive: Chronically absent uterus, diminutive or absent ovaries. Other: No pelvic free fluid. Musculoskeletal: No acute osseous abnormality identified. Review of the MIP images confirms the above findings. IMPRESSION: 1. Intubated, endotracheal tube tip terminates just inside the right mainstem bronchus. Recommend retracting 1 cm. 2. Negative for aortic dissection or aneurysm. Aortic Atherosclerosis (ICD10-I70.0). 3. Positive for dependent consolidation in both lungs, especially lower lobes greater on the left. This is most compatible with Pneumonia Versus Aspiration. 4. Trace layering pleural effusions and additional central pulmonary ground-glass opacity with septal thickening raising the possibility of mild or developing interstitial edema. Stable cardiomegaly. No pericardial effusion. 5. No acute or inflammatory process identified in the abdomen or pelvis. Chronic right renal cortical scarring. Electronically Signed: By: Genevie Ann M.D. On: 07/29/2022 04:57   CT Head Wo Contrast  Result Date: 07/29/2022 CLINICAL DATA:  71 year old female unresponsive at home. Altered mental status. Intubated. EXAM: CT HEAD WITHOUT CONTRAST TECHNIQUE: Contiguous axial images were obtained from the base of the skull through the vertex without intravenous contrast. RADIATION DOSE REDUCTION: This exam was performed according to the departmental dose-optimization program which includes automated exposure control, adjustment of the mA and/or kV according to patient size and/or use of iterative reconstruction technique. COMPARISON:  Head CT 08/03/2009. FINDINGS: Brain:  Cerebral  volume is within normal limits for age. No midline shift, ventriculomegaly, mass effect, evidence of mass lesion, intracranial hemorrhage or evidence of cortically based acute infarction. Gray-white matter differentiation is within normal limits for age. No convincing encephalomalacia. Vascular: Calcified atherosclerosis at the skull base. No suspicious intracranial vascular hyperdensity. Skull: Stable.  No acute osseous abnormality identified. Sinuses/Orbits: Visualized paranasal sinuses and mastoids are stable and well aerated. Other: Visualized orbits and scalp soft tissues are within normal limits. IMPRESSION: Normal for age non contrast CT appearance of the brain, head. Electronically Signed   By: Odessa Fleming M.D.   On: 07/29/2022 04:48   DG Chest Portable 1 View  Result Date: 07/29/2022 CLINICAL DATA:  71 year old female with shortness of breath. EXAM: PORTABLE CHEST 1 VIEW COMPARISON:  Chest radiographs 12/31/2011 and earlier. CT Abdomen and Pelvis 12/25/2021. FINDINGS: Portable AP semi upright view at 0347 hours. Cardiomegaly, new since 2013 but appears stable from the CT Abdomen and Pelvis this year. Other mediastinal contours are within normal limits. Visualized tracheal air column is within normal limits. Lung volumes are within normal limits. No pneumothorax, pleural effusion or consolidation. Mild streaky bilateral perihilar opacity. Increased pulmonary vascularity from prior radiographs, but no overt edema. No acute osseous abnormality identified. Paucity of bowel gas in the upper abdomen. IMPRESSION: 1. Cardiomegaly with vascular congestion but no overt edema. 2. Streaky bilateral perihilar opacity, nonspecific but favor atelectasis. Electronically Signed   By: Odessa Fleming M.D.   On: 07/29/2022 04:00    Standley Dakins, MD  How to contact the [facility] Attending or Consulting provider 7A - 7P or covering provider during after hours 7P -7A, for this patient?  Check the care team in [facility] and look for a)  attending/consulting [facility] provider listed and b) the [facility] team listed Log into [email or web address] and use [place]'s universal password to access. If you do not have the password, please contact the hospital operator. Locate the [facility] provider you are looking for under [facility] and page to a number that you can be directly reached. If you still have difficulty reaching the provider, please page the [facility] (Director on Call) for the Hospitalists listed on amion for assistance.  08/13/2022, 1:48 PM   LOS: 3 days

## 2022-08-13 NOTE — Progress Notes (Signed)
Continues to have left sided pain and received dilaudid twice today. Sat up in chair for long time today, walked in hall with PT and has walked with standby to bathroom.

## 2022-08-13 NOTE — Plan of Care (Signed)
  Problem: Clinical Measurements: Goal: Respiratory complications will improve Outcome: Not Progressing   Problem: Clinical Measurements: Goal: Diagnostic test results will improve Outcome: Not Progressing   Problem: Clinical Measurements: Goal: Cardiovascular complication will be avoided Outcome: Not Progressing   Problem: Pain Managment: Goal: General experience of comfort will improve Outcome: Not Progressing

## 2022-08-13 NOTE — Progress Notes (Signed)
NAME:  Summer Christensen, MRN:  [id number], DOB:  09-14-1951, LOS: 3 ADMISSION DATE:  08/10/2022, CONSULTATION DATE:  10/24  REFERRING MD:  Tat   CHIEF COMPLAINT:  cough /cp    History of Present Illness:  62 yobf never smoker s/p ET 10/0-10/12 for likely Sys chf complicated by ? Aspiration with nl flora on ET specimen and persistent LLL decreased in aeration at time of d/c on 10/16 on 2lpm and persistent severe dry coughing fits since d/c  then 10/18 developed  L  pleuritic cp.    Pertinent  Medical History   71 y.o. 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  Pain was constant and was rated as 10/10 on pain scale which worsens on coughing.  She states that she has not been moving around much since being discharged on 10/16.  She denies fever, chills, nausea, vomiting, abdominal pain.  Patient was admitted from 10/10 to 10/16 due to acute respiratory failure with hypoxia, acute CHF (systolic and diastolic) and was discharged with Lasix, Coreg, Farxiga, spironolactone and ARB.  Significant Hospital Events: Including procedures, antibiotic start and stop dates in addition to other pertinent events   CTa  10/22 1. Small left upper lobe segmental pulmonary artery emboli. No CT evidence of right heart straining. 2. Right lower lobe pneumonia. There may be areas of early cavitary change at the right lung base. 3. Small left pleural effusion with left lung base partial compressive atelectasis or infiltrate. Overall improved aeration of the left lower lobe compared to prior CT. 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   Scheduled Meds:  apixaban  10 mg Oral BID   Followed by   Derrill Memo ON 08/20/2022]  apixaban  5 mg Oral BID   aspirin EC  81 mg Oral Daily   atorvastatin  40 mg Oral Daily   carvedilol  3.125 mg Oral BID WC   dextromethorphan-guaiFENesin  2 tablet Oral BID   empagliflozin  10 mg Oral Daily   feeding supplement  237 mL Oral TID BM   furosemide  40 mg Oral Daily   gabapentin  100 mg Oral TID   lidocaine  1 patch Transdermal Q24H   losartan  25 mg Oral Daily   multivitamin with minerals  1 tablet Oral Daily   pantoprazole  40 mg Oral BID AC   Continuous Infusions:  ceFEPime (MAXIPIME) IV 2 g (08/13/22 0934)   vancomycin Stopped (08/12/22 2252)   PRN Meds:.acetaminophen **OR** acetaminophen, HYDROcodone bit-homatropine, HYDROmorphone (DILAUDID) injection, ondansetron **OR** ondansetron (ZOFRAN) IV  Interim History / Subjective:  Cough much better as is the L ant/lateral cp   Objective   Blood pressure 129/63, pulse 87, temperature 99.1 F (37.3 C), temperature source Oral, resp. rate 20, height 5' 4"  (1.626 m), weight 65.3 kg, SpO2 95 %.        Intake/Output Summary (Last 24 hours) at 08/13/2022 1302 Last data filed at 08/13/2022 0900 Gross per 24 hour  Intake 816.29 ml  Output 1500 ml  Net -683.71 ml   Filed Weights   08/10/22 1818  Weight: 65.3 kg    Examination: Tmax:  99.1  General appearance:    elderly bf sitting up in chair   At Rest  02 sats  95% on 2lpm   No jvd Oropharynx clear,  mucosa nl Neck supple Lungs with decreased bs both bases / dry sounding cough  RRR no s3 or or sign murmur Abd soft/ benign  Extr warm with no edema or clubbing noted Neuro  Sensorium intact,  no apparent motor deficits        Best Practice (right click and "Reselect all SmartList Selections" daily)  Per [facility]   Labs   CBC: Recent Labs  Lab 08/10/22 1856 08/11/22 0343 08/12/22 0515 08/13/22 0444  WBC 20.0* 19.5* 22.2* 19.6*  NEUTROABS 15.9*  --   --   --   HGB 12.3 12.2 10.6* 10.6*  HCT 37.2 37.5 33.0* 32.7*  MCV 84.9 86.2 87.3 87.0  PLT 450* 398  398 403*    Basic Metabolic Panel: Recent Labs  Lab 08/10/22 1856 08/11/22 0343 08/12/22 0515 08/13/22 0444  NA 136 137 137 138  K 3.7 4.0 4.5 4.0  CL 102 102 103 107  CO2 24 25 25 23   GLUCOSE 151* 141* 132* 157*  BUN 19 18 23  29*  CREATININE 1.20* 0.94 1.31* 1.17*  CALCIUM 9.9 9.4 9.7 9.9  MG  --  2.3  --   --   PHOS  --  4.3  --   --    GFR: Estimated Creatinine Clearance: 38.6 mL/min (A) (by C-G formula based on SCr of 1.17 mg/dL (H)). Recent Labs  Lab 08/10/22 1856 08/10/22 1909 08/10/22 2155 08/11/22 0343 08/12/22 0515 08/13/22 0444  PROCALCITON  --   --   --  <0.10  --   --   WBC 20.0*  --   --  19.5* 22.2* 19.6*  LATICACIDVEN  --  1.8 1.4  --   --   --     Liver Function Tests: Recent Labs  Lab 08/10/22 1856 08/11/22 0343 08/12/22 0515  AST 20 247* 74*  ALT 33 107* 101*  ALKPHOS 90 126 130*  BILITOT 0.5 0.8 0.7  PROT 8.4* 7.0 6.8  ALBUMIN 3.9 3.3* 3.0*   No results for input(s): "LIPASE", "AMYLASE" in the last 168 hours. No results for input(s): "AMMONIA" in the last 168 hours.  ABG    Component Value Date/Time   PHART 7.42 07/30/2022 0319   PCO2ART 35 07/30/2022 0319   PO2ART 100 07/30/2022 0319   HCO3 30.2 (H) 07/31/2022 2129   TCO2 24 07/29/2022 0351   ACIDBASEDEF 1.3 07/30/2022 0319   O2SAT 26.8 07/31/2022 2129     Coagulation Profile: Recent Labs  Lab 08/10/22 1856  INR 1.1    Cardiac Enzymes: No results for input(s): "CKTOTAL", "CKMB", "CKMBINDEX", "TROPONINI" in the last 168 hours.  HbA1C: Hgb A1c MFr Bld  Date/Time Value Ref Range Status  07/29/2022 06:23 AM 6.3 (H) 4.8 - 5.6 % Final    Comment:    (NOTE) Pre diabetes:          5.7%-6.4%  Diabetes:              >6.4%  Glycemic control for   <7.0% adults with diabetes     CBG: Recent Labs  Lab 08/11/22 0028 08/11/22 0721 08/11/22 1141  GLUCAP 138* 109* 109*       Lab Results  Component Value Date   ESRSEDRATE 73 (H) 08/13/2022         Imp/Rec  1)   Acute PE / dvt p recent admit with resp failure/ vent dependent probably complicated by aspiration/hcap and acute on  chronic hypoxemic Resp failure - now on DOAC and tol fine - continue mobilize/ IS    2) L chest wall pain is likely from severe coughing fits/ rib injury and not directly related to small L PE but hard to be sure as also has small effusion with pleuritis likely  >>> continue gabapentin and prn opioids  >>> ESR up a bit typical of pleuritis or organizing pna > check again am 10/26 and consider short course prednisone if trending up   3) Asp vs HCAP both lower lobes   - MRSA PCR screen neg/ other studies pending  >>> continue maxepime/ d/c vanc   ? Home 10/26 ??    [name], MD Pulmonary and [place] [phone]   After 7:00 pm call Elink  [phone]

## 2022-08-14 ENCOUNTER — Inpatient Hospital Stay (HOSPITAL_COMMUNITY): Payer: Medicare Other

## 2022-08-14 LAB — CBC
HCT: 30.9 % — ABNORMAL LOW (ref 36.0–46.0)
Hemoglobin: 10 g/dL — ABNORMAL LOW (ref 12.0–15.0)
MCH: 28.2 pg (ref 26.0–34.0)
MCHC: 32.4 g/dL (ref 30.0–36.0)
MCV: 87.3 fL (ref 80.0–100.0)
Platelets: 410 10*3/uL — ABNORMAL HIGH (ref 150–400)
RBC: 3.54 MIL/uL — ABNORMAL LOW (ref 3.87–5.11)
RDW: 12.7 % (ref 11.5–15.5)
WBC: 15.7 10*3/uL — ABNORMAL HIGH (ref 4.0–10.5)
nRBC: 0 % (ref 0.0–0.2)

## 2022-08-14 LAB — SEDIMENTATION RATE: Sed Rate: 110 mm/hr — ABNORMAL HIGH (ref 0–22)

## 2022-08-14 MED ORDER — GABAPENTIN 100 MG PO CAPS
200.0000 mg | ORAL_CAPSULE | Freq: Three times a day (TID) | ORAL | Status: DC
  Administered 2022-08-14 – 2022-08-15 (×4): 200 mg via ORAL
  Filled 2022-08-14 (×4): qty 2

## 2022-08-14 MED ORDER — PREDNISONE 20 MG PO TABS
20.0000 mg | ORAL_TABLET | Freq: Two times a day (BID) | ORAL | Status: DC
  Administered 2022-08-14 – 2022-08-15 (×3): 20 mg via ORAL
  Filled 2022-08-14 (×4): qty 1

## 2022-08-14 MED ORDER — APIXABAN 5 MG PO TABS: ORAL_TABLET | ORAL | 2 refills | Status: DC

## 2022-08-14 NOTE — Progress Notes (Signed)
Pt has been up OOB to bathroom and to chair without difficulty this am. Only complains of pain in left side of chest with movement. Currently pt is in radiology department for ordered CXR.

## 2022-08-14 NOTE — Discharge Instructions (Signed)
IMPORTANT INFORMATION: PAY CLOSE ATTENTION   PHYSICIAN DISCHARGE INSTRUCTIONS  Follow with Primary care provider  Kikel, Stephen, MD  and other consultants as instructed by your Hospitalist Physician  SEEK MEDICAL CARE OR RETURN TO EMERGENCY ROOM IF SYMPTOMS COME BACK, WORSEN OR NEW PROBLEM DEVELOPS   Please note: You were cared for by a hospitalist during your hospital stay. Every effort will be made to forward records to your primary care provider.  You can request that your primary care provider send for your hospital records if they have not received them.  Once you are discharged, your primary care physician will handle any further medical issues. Please note that NO REFILLS for any discharge medications will be authorized once you are discharged, as it is imperative that you return to your primary care physician (or establish a relationship with a primary care physician if you do not have one) for your post hospital discharge needs so that they can reassess your need for medications and monitor your lab values.  Please get a complete blood count and chemistry panel checked by your Primary MD at your next visit, and again as instructed by your Primary MD.  Get Medicines reviewed and adjusted: Please take all your medications with you for your next visit with your Primary MD  Laboratory/radiological data: Please request your Primary MD to go over all hospital tests and procedure/radiological results at the follow up, please ask your primary care provider to get all Hospital records sent to his/her office.  In some cases, they will be blood work, cultures and biopsy results pending at the time of your discharge. Please request that your primary care provider follow up on these results.  If you are diabetic, please bring your blood sugar readings with you to your follow up appointment with primary care.    Please call and make your follow up appointments as soon as possible.    Also Note  the following: If you experience worsening of your admission symptoms, develop shortness of breath, life threatening emergency, suicidal or homicidal thoughts you must seek medical attention immediately by calling 911 or calling your MD immediately  if symptoms less severe.  You must read complete instructions/literature along with all the possible adverse reactions/side effects for all the Medicines you take and that have been prescribed to you. Take any new Medicines after you have completely understood and accpet all the possible adverse reactions/side effects.   Do not drive when taking Pain medications or sleeping medications (Benzodiazepines)  Do not take more than prescribed Pain, Sleep and Anxiety Medications. It is not advisable to combine anxiety,sleep and pain medications without talking with your primary care practitioner  Special Instructions: If you have smoked or chewed Tobacco  in the last 2 yrs please stop smoking, stop any regular Alcohol  and or any Recreational drug use.  Wear Seat belts while driving.  Do not drive if taking any narcotic, mind altering or controlled substances or recreational drugs or alcohol.       

## 2022-08-14 NOTE — Progress Notes (Signed)
Pt has been in bed most of this afternoon since back from x-ray. Continues with c/o pain left chest with movement. Medications admin'd per order. No s/o SOB, no resp difficulty. VSS. Currently husband at bedside.

## 2022-08-14 NOTE — Care Management Important Message (Signed)
Important Message  Patient Details  Name: Summer Christensen MRN: [id number] Date of Birth: 1951-06-10   Medicare Important Message Given:  Yes     Tommy Medal 08/14/2022, 12:08 PM

## 2022-08-14 NOTE — Progress Notes (Signed)
Pt stating she has numbness in the fingertips of her 2nd - 5th fingers of her right hand. Pt stated it started earlier today and she thought is was due to the IVF fluids but it hasn't gotten any better since IVF stopped. Right radial pulse +/strong, right arm and hand with normal skin color/temp, cap refill < 3 sec. Pt able to move fingers/wrist hand and arm without difficulty. Will notify MD.

## 2022-08-14 NOTE — Progress Notes (Signed)
NAME:  Summer Christensen, MRN:  [id number], DOB:  [birth year], LOS: 4 ADMISSION DATE:  08/10/2022, CONSULTATION DATE:  10/24  REFERRING MD:  Tat   CHIEF COMPLAINT:  cough /cp    History of Present Illness:  33 yobf never smoker s/p ET 10/0-10/12 for likely Sys chf complicated by ? Aspiration with nl flora on ET specimen and persistent LLL decreased in aeration at time of d/c on 10/16 on 2lpm and persistent severe dry coughing fits since d/c  then 10/18 developed  L  pleuritic cp.    Pertinent  Medical History   71 y.o. 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  Pain was constant and was rated as 10/10 on pain scale which worsens on coughing.  She states that she has not been moving around much since being discharged on 10/16.  She denies fever, chills, nausea, vomiting, abdominal pain.  Patient was admitted from 10/10 to 10/16 due to acute respiratory failure with hypoxia, acute CHF (systolic and diastolic) and was discharged with Lasix, Coreg, Farxiga, spironolactone and ARB.  Significant Hospital Events: Including procedures, antibiotic start and stop dates in addition to other pertinent events   CTa  10/22 1. Small left upper lobe segmental pulmonary artery emboli. No CT evidence of right heart straining. 2. Right lower lobe pneumonia. There may be areas of early cavitary change at the right lung base. 3. Small left pleural effusion with left lung base partial compressive atelectasis or infiltrate. Overall improved aeration of the left lower lobe compared to prior CT. 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   Scheduled Meds:  apixaban  10 mg Oral BID   Followed by   Derrill Memo ON 08/20/2022]  apixaban  5 mg Oral BID   aspirin EC  81 mg Oral Daily   atorvastatin  40 mg Oral Daily   carvedilol  3.125 mg Oral BID WC   dextromethorphan-guaiFENesin  2 tablet Oral BID   empagliflozin  10 mg Oral Daily   feeding supplement  237 mL Oral TID BM   furosemide  40 mg Oral Daily   gabapentin  200 mg Oral TID   lidocaine  1 patch Transdermal Q24H   losartan  25 mg Oral Daily   multivitamin with minerals  1 tablet Oral Daily   pantoprazole  40 mg Oral BID AC   predniSONE  20 mg Oral BID WC   Continuous Infusions:  ceFEPime (MAXIPIME) IV 2 g (08/14/22 1208)   PRN Meds:.acetaminophen **OR** acetaminophen, HYDROcodone bit-homatropine, HYDROmorphone (DILAUDID) injection, ondansetron **OR** ondansetron (ZOFRAN) IV  Interim History / Subjective:  Cough gone, no change L positional cp   Objective   Blood pressure 130/64, pulse 86, temperature 97.9 F (36.6 C), resp. rate 16, height 5' 4"  (1.626 m), weight 65.3 kg, SpO2 100 %.        Intake/Output Summary (Last 24 hours) at 08/14/2022 1328 Last data filed at 08/14/2022 0900 Gross per 24 hour  Intake 480 ml  Output --  Net 480 ml   Filed Weights   08/10/22 1818  Weight: 65.3 kg    Examination: Tmax:  99.5 General appearance:    uncomfortable elderly bf lying sideways in chair c/o same pain under L breast  At Rest 02 sats  100% on ? How much 02   No jvd Oropharynx clear,  mucosa nl Neck supple Lungs with decreased bs/ dullness both bases  RRR no s3 or or sign murmur Abd soft with nl excursion  Extr warm with no edema or clubbing noted Neuro  Sensorium inactt,  no apparent motor deficits    I personally reviewed images and agree with radiology impression as follows:  CXR:   pa and lateral 10/26  Left greater than right bibasilar atelectasis/infiltrate with small left pleural effusion.      Best Practice (right click and "Reselect all SmartList Selections" daily)  Per [facility]   Labs   CBC: Recent Labs  Lab  08/10/22 1856 08/11/22 0343 08/12/22 0515 08/13/22 0444 08/14/22 0337  WBC 20.0* 19.5* 22.2* 19.6* 15.7*  NEUTROABS 15.9*  --   --   --   --   HGB 12.3 12.2 10.6* 10.6* 10.0*  HCT 37.2 37.5 33.0* 32.7* 30.9*  MCV 84.9 86.2 87.3 87.0 87.3  PLT 450* 398 398 403* 410*    Basic Metabolic Panel: Recent Labs  Lab 08/10/22 1856 08/11/22 0343 08/12/22 0515 08/13/22 0444  NA 136 137 137 138  K 3.7 4.0 4.5 4.0  CL 102 102 103 107  CO2 24 25 25 23   GLUCOSE 151* 141* 132* 157*  BUN 19 18 23  29*  CREATININE 1.20* 0.94 1.31* 1.17*  CALCIUM 9.9 9.4 9.7 9.9  MG  --  2.3  --   --   PHOS  --  4.3  --   --    GFR: Estimated Creatinine Clearance: 38.6 mL/min (A) (by C-G formula based on SCr of 1.17 mg/dL (H)). Recent Labs  Lab 08/10/22 1909 08/10/22 2155 08/11/22 0343 08/12/22 0515 08/13/22 0444 08/14/22 0337  PROCALCITON  --   --  <0.10  --   --   --   WBC  --   --  19.5* 22.2* 19.6* 15.7*  LATICACIDVEN 1.8 1.4  --   --   --   --     Liver Function Tests: Recent Labs  Lab 08/10/22 1856 08/11/22 0343 08/12/22 0515  AST 20 247* 74*  ALT 33 107* 101*  ALKPHOS 90 126 130*  BILITOT 0.5 0.8 0.7  PROT 8.4* 7.0 6.8  ALBUMIN 3.9 3.3* 3.0*   No results for input(s): "LIPASE", "AMYLASE" in the last 168 hours. No results for input(s): "AMMONIA" in the last 168 hours.  ABG    Component Value Date/Time   PHART 7.42 07/30/2022 0319   PCO2ART 35 07/30/2022 0319   PO2ART 100 07/30/2022 0319   HCO3 30.2 (H) 07/31/2022 2129   TCO2 24 07/29/2022 0351   ACIDBASEDEF 1.3 07/30/2022 0319   O2SAT 26.8 07/31/2022 2129     Coagulation Profile: Recent Labs  Lab 08/10/22 1856  INR 1.1    Cardiac Enzymes: No results for input(s): "CKTOTAL", "CKMB", "CKMBINDEX", "TROPONINI" in the last 168 hours.  HbA1C: Hgb A1c MFr Bld  Date/Time Value Ref Range Status  07/29/2022 06:23 AM 6.3 (H) 4.8 - 5.6 % Final    Comment:    (NOTE) Pre diabetes:          5.7%-6.4%  Diabetes:               >6.4%  Glycemic control for   <7.0% adults with diabetes     CBG: Recent Labs  Lab 08/11/22 0028 08/11/22 0721 08/11/22 1141 08/13/22 1613  GLUCAP 138* 109* 109* 109*  Lab Results  Component Value Date   ESRSEDRATE 110 (H) 08/14/2022   ESRSEDRATE 45 (H) 08/13/2022         Imp/Rec  1)  Acute PE / dvt p recent admit with resp failure/ vent dependent probably complicated by aspiration/hcap and acute on chronic hypoxemic Resp failure - now on DOAC and tol fine - continue mobilize/ IS    2) L chest wall pain is likely from severe coughing fits/ rib injury and not directly related to small L PE but hard to be sure as also has small effusion with pleuritis likely   >>> increased  gabapentin to 200 mg tid and continue  prn opioids  >>> ESR trending way up c/w pleuritis vs organizing pna > try prednisone 20 mg bid until better then 20 mgdaily x 5 days, 10 mg daily x 5 days and stop  3) Asp vs HCAP both lower lobes   - MRSA PCR screen neg/ other studies pending  >>> rx per [facility]  F/u 2 weeks in my office     [name], MD [facility] [phone]   After 7:00 pm call Elink  [phone]

## 2022-08-14 NOTE — Progress Notes (Signed)
Pt states no relief of pain from previous meds and also states has started with dry cough. Hydrocodone cough med admin'd per PRN order. No increased SOB, no resp difficulty.

## 2022-08-14 NOTE — Discharge Summary (Addendum)
Physician Discharge Summary  Summer Christensen VHQ:[id number] DOB: 1950-11-15 DOA: 08/10/2022  PCP: Smith Robert, MD  Admit date: 08/10/2022 Discharge date: 08/15/2022  Admitted From:  HOME  Disposition: HOME with HH  Recommendations for Outpatient Follow-up:  Follow up with PCP in 1 weeks Follow up with [facility] in 2 weeks Please obtain BMP/CBC in 1-2 weeks to follow up Please recheck LFTs in 2 weeks to follow up mildly elevated LFTs Bleeding precautions while on anticoagulant therapy PCP to decide if patient needs to continue aspirin while on apixaban, holding ASA at discharge Ambulatory referral to cardiology to follow up cardiomyopathy  Home Health:  PT   Discharge Condition: STABLE   CODE STATUS: FULL DIET: resume prior home diet   Brief Hospitalization Summary: Please see all hospital notes, images, labs for full details of the hospitalization. Brief History:  71 year old female with a history of hypertension, hyperlipidemia, LBBB, systolic CHF, diabetes mellitus type 2 presenting with pleuritic left chest pain that began on 08/06/2022 and has progressively worsened over the past 3 days prior to admission.  Notably, the patient was recently hospitalized from 07/29/2022 to 08/04/2022.  During that hospital admission, the patient had respiratory failure and was on mechanical ventilation.  Her Respiratory failure was multifactorial including systolic CHF (EF <41%) and pneumonia.  She was initially treated with Zosyn and azithromycin.  She was discharged home with Augmentin.  Regarding her CHF, she was seen by cardiology, and the patient was started on carvedilol, Farxiga, spironolactone, and losartan.  Since returning home, the patient states that she had remained somewhat short of breath but not any worsening.  She denies any worsening peripheral edema.  She denies any fevers, chills, nausea, vomiting or diarrhea abdominal pain.  She had a worsening cough without  hemoptysis.  This resulted in her left pleuritic chest pain which prompted her return to the emergency department.  In the ED, the patient had a low-grade temperature of 99.3.  She was initially tachycardic up to 130s.  She was hemodynamically stable.  Oxygen saturation is 98% on 2 L.  CTA chest showed a small LUL pulmonary embolus.  There was RLL opacity with signs of early cavitation.  There is improving LLL infiltrate.  The patient was started on IV heparin.  WBC 20.0, hemoglobin 12.3, platelets 456,000.  Sodium 137, potassium 4.0, bicarbonate 25, serum creatinine 0.94.  UA showed 6-10 WBC.  Lactic acid 1.8>> 1.4.  EKG showed sinus tachycardia with left bundle branch block.   HOSPITAL COURSE BY PROBLEM   Acute pulmonary embolus / DVT Right leg  -CTA chest as described above -10/23-Limited echo EF 20-25%, global HK, normal RVF, +IV septum flattening -Treated with IV heparin with transition to apixaban on 10/25 -venous duplex: Positive for deep vein thrombosis in the right calf veins and in the left popliteal and peroneal veins - added TED hoses   Lobar pneumonia and atelectasis  -08/10/2022 CTA chest--POS PE, RLL opacity with early cavitation -Continue cefepime; DC vanc with neg MRSA screen -07/29/2022 CTA chest--bilateral LL consolidation, L>R; bilateral centrilobular GGO -Check PCT <0.10 -WBC peaked and now trending down with therapy -pulmonary consult appreciated  -de-escalated antibiotics to doxycycline to complete 3 more days    Pleuritic chest pain -improved with treatments  -discussed with Dr. Sherene Sires, Rx for discharge prednisone 20 mg taper over 1 week, gabapentin 300 mg TID, oxycodone prn, follow up with pulmonary clinic in 2 weeks outpatient   Chronic HFrEF -07/29/2022 echo EF <20%, global HK, grade  3 DD, mild to moderate MR -Continue home dose furosemide -Continue carvedilol, losartan, Jardiance, spironolactone -presently appears euvolemic -follow up with cardiologist :  ambulatory referral to cardiology   Chronic respiratory failure with hypoxia -sent home on 2L on 08/04/22 -currently stable on 2L -may need to increase to 3-4 L with activity due to long cord at home   Impaired glucose tolerance -07/29/2022 hemoglobin A1c 6.3 -Lifestyle modification   Aortic atherosclerosis -resumed atorvastatin   Transaminasemia -likely due to hemodynamic changes -RUQ US--hepatic steatosis -no abd pain -trend LFTs>>improving --check COVID -19--neg --recheck LFTs outpatient with PCP recommended in 1 month   Discharge Diagnoses:  Principal Problem:   Pulmonary embolism (HCC) Active Problems:   Essential hypertension   Gastroesophageal reflux disease   Acute respiratory failure with hypoxia (HCC)   Pleural effusion   Elevated brain natriuretic peptide (BNP) level   Type 2 diabetes mellitus with hyperlipidemia (HCC)   HCAP (healthcare-associated pneumonia)   Chronic HFrEF (heart failure with reduced ejection fraction) (HCC)   Necrotizing pneumonia (HCC)   Chronic respiratory failure with hypoxia [facility])   Discharge Instructions: Discharge Instructions     Ambulatory referral to Cardiology   Complete by: As directed    Ambulatory referral to Pulmonology   Complete by: As directed    2 week hospital follow up per Dr. Sherene Sires   Reason for referral: Other      Allergies as of 08/15/2022   No Known Allergies      Medication List     STOP taking these medications    aspirin EC 81 MG tablet       TAKE these medications    apixaban 5 MG Tabs tablet Commonly known as: ELIQUIS 2 po bid thru 10/31 then 1 po BID starting 08/20/22 Start taking on: August 20, 2022   atorvastatin 40 MG tablet Commonly known as: LIPITOR Take 40 mg by mouth daily.   carvedilol 3.125 MG tablet Commonly known as: COREG Take 1 tablet (3.125 mg total) by mouth 2 (two) times daily with a meal.   dicyclomine 20 MG tablet Commonly known as: BENTYL Take 20 mg by  mouth 4 (four) times daily as needed for spasms.   empagliflozin 10 MG Tabs tablet Commonly known as: JARDIANCE Take 1 tablet (10 mg total) by mouth daily.   furosemide 40 MG tablet Commonly known as: LASIX Take 1 tablet (40 mg total) by mouth daily.   gabapentin 300 MG capsule Commonly known as: NEURONTIN Take 1 capsule (300 mg total) by mouth 3 (three) times daily for 14 days.   losartan 25 MG tablet Commonly known as: COZAAR Take 1 tablet (25 mg total) by mouth daily.   oxyCODONE 5 MG immediate release tablet Commonly known as: Oxy IR/ROXICODONE Take 1 tablet (5 mg total) by mouth every 6 (six) hours as needed for up to 3 days for severe pain.   pantoprazole 40 MG tablet Commonly known as: PROTONIX Take 1 tablet (40 mg total) by mouth daily.   predniSONE 20 MG tablet Commonly known as: DELTASONE Take 1 tablet (20 mg total) by mouth 2 (two) times daily with a meal for 3 days, THEN 1 tablet (20 mg total) daily with breakfast for 4 days. Start taking on: August 15, 2022   Probiotic 250 MG Caps Take 1 capsule by mouth daily.   spironolactone 25 MG tablet Commonly known as: ALDACTONE Take 0.5 tablets (12.5 mg total) by mouth daily.        Follow-up Information  Smith Robert, MD. Schedule an appointment as soon as possible for a visit in 1 week(s).   Specialty: Family Medicine Why: Hospital Follow Up Contact information: [address] [place] [address] [phone]         [facility]igh[facility]. [facility]. Schedule an appointment as soon as possible for a visit in 1 week(s).   Specialty: Cardiology Why: Hospital Follow Up Contact information: [address] [id number] mc [place] [address] [phone]        [facility]. Schedule an appointment as soon as possible for a visit in 2 week(s).   Specialty: Pulmonology Why: Hospital Follow Up Contact information: [address], [address] [place] [address] [phone]               No Known Allergies Allergies as of 08/15/2022   No Known Allergies      Medication List     STOP taking these medications    aspirin EC 81 MG tablet       TAKE these medications    apixaban 5 MG Tabs tablet Commonly known as: ELIQUIS 2 po bid thru 10/31 then 1 po BID starting 08/20/22 Start taking on: August 20, 2022   atorvastatin 40 MG tablet Commonly known as: LIPITOR Take 40 mg by mouth daily.   carvedilol 3.125 MG tablet Commonly known as: COREG Take 1 tablet (3.125 mg total) by mouth 2 (two) times daily with a meal.   dicyclomine 20 MG tablet Commonly known as: BENTYL Take 20 mg by mouth 4 (four) times daily as needed for spasms.   empagliflozin 10 MG Tabs tablet Commonly known as: JARDIANCE Take 1 tablet (10 mg total) by mouth daily.   furosemide 40 MG tablet Commonly known as: LASIX Take 1 tablet (40 mg total) by mouth daily.   gabapentin 300 MG capsule Commonly known as: NEURONTIN Take 1 capsule (300 mg total) by mouth 3 (three) times daily for 14 days.   losartan 25 MG tablet Commonly known as: COZAAR Take 1 tablet (25 mg total) by mouth daily.   oxyCODONE 5 MG immediate release tablet Commonly known as: Oxy IR/ROXICODONE Take 1 tablet (5 mg total) by mouth every 6 (six) hours as needed for up to 3 days for severe pain.   pantoprazole 40 MG tablet Commonly known as: PROTONIX Take 1 tablet (40 mg total) by mouth daily.   predniSONE 20 MG tablet Commonly known as: DELTASONE Take 1 tablet (20 mg total) by mouth 2 (two) times daily with a meal for 3 days, THEN 1 tablet (20 mg total) daily with breakfast for 4 days. Start taking on: August 15, 2022   Probiotic 250 MG Caps Take 1 capsule by mouth daily.   spironolactone 25 MG tablet Commonly known as: ALDACTONE Take 0.5 tablets (12.5 mg total) by mouth daily.        Procedures/Studies: DG  Ribs Unilateral Left  Result Date: 08/14/2022 CLINICAL DATA:  Left-sided chest pain. EXAM: LEFT RIBS - 2 VIEW COMPARISON:  Chest radiograph from earlier today. FINDINGS: No fracture or other bone lesions are seen involving the ribs. Left basilar atelectasis/airspace disease and a small left pleural effusion appear unchanged. IMPRESSION: 1. No rib fractures. 2. Left basilar atelectasis/airspace disease and a small left pleural effusion appear unchanged. Electronically Signed   By: Romona Curls M.D.   On: 08/14/2022 13:54   DG Chest 2 View  Result Date: 08/14/2022 CLINICAL DATA:  Left-sided chest pain. EXAM: CHEST - 2 VIEW COMPARISON:  08/10/2022 FINDINGS: Low volume film. Left greater than right bibasilar atelectasis/infiltrate with small left pleural effusion noted. No pulmonary edema. The visualized bony structures of the thorax are unremarkable. Telemetry leads overlie the chest. IMPRESSION: Left greater than right bibasilar atelectasis/infiltrate with small left pleural effusion. Electronically Signed   By: Kennith Center M.D.   On: 08/14/2022 10:45   ECHOCARDIOGRAM LIMITED  Result Date: 08/11/2022    ECHOCARDIOGRAM LIMITED REPORT   Patient Name:   Summer Christensen Durnil Date of Exam: 08/11/2022 Medical Rec #:  [id number]     Height:       64.0 in Accession #:    [id number]    Weight:       144.0 lb Date of Birth:  16-Nov-1950     BSA:          1.701 m Patient Age:    70 years      BP:           127/62 mmHg Patient Gender: F             HR:           102 bpm. [facility] Procedure: Limited Echo and Limited Color Doppler Indications:    Pulmonary embolus  History:        Patient has prior history of Echocardiogram examinations, most                 recent 07/29/2022. CHF, Arrythmias:LBBB, Signs/Symptoms:Chest                 Pain; Risk Factors:Hypertension and Diabetes. Pulmonary embolus.  Sonographer:    Mikki Harbor Referring Phys: [id number] OLADAPO ADEFESO IMPRESSIONS  1. Global hypokinesis  with anteroseptal akinesis.. Left ventricular ejection fraction, by estimation, is 20 to 25%. The left ventricle has severely decreased function. The left ventricle demonstrates global hypokinesis. The left ventricular internal cavity size was moderately dilated.  2. Systolic flattening of the ventricular septum suggests RV pressure overload. . Right ventricular systolic function is normal. The right ventricular size is normal. There is mildly elevated pulmonary artery systolic pressure.  3. The tricuspid valve is abnormal. Tricuspid valve regurgitation is mild.  4. The inferior vena cava is normal in size with greater than 50% respiratory variability, suggesting right atrial pressure of 3 mmHg.  5. Limited echo to evaluate RV function FINDINGS  Left Ventricle: Global hypokinesis with anteroseptal akinesis. Left ventricular ejection fraction, by estimation, is 20 to 25%. The left ventricle has severely decreased function. The left ventricle demonstrates global hypokinesis. The left ventricular internal cavity size was moderately dilated. There is no left ventricular hypertrophy. Right Ventricle: Systolic flattening of the ventricular septum suggests RV pressure overload. The right ventricular size is normal. Right vetricular wall thickness was not well visualized. Right ventricular systolic function is normal. There is mildly elevated pulmonary artery systolic pressure. The tricuspid regurgitant velocity is 3.12 m/s, and with an assumed right atrial pressure of 3 mmHg, the estimated right ventricular systolic pressure is 41.9 mmHg. Pericardium: There is no evidence of pericardial effusion. Tricuspid Valve: The tricuspid valve is abnormal. Tricuspid valve regurgitation is mild . No evidence of tricuspid stenosis. Aorta: The aortic root is normal in size and structure. Venous: The inferior vena cava is normal in size with greater than 50% respiratory variability, suggesting right atrial pressure of 3 mmHg. LEFT  VENTRICLE PLAX 2D LVIDd:  6.60 cm LVIDs:         5.60 cm LV PW:         0.80 cm LV IVS:        1.00 cm LVOT diam:     2.00 cm LVOT Area:     3.14 cm  LV Volumes (MOD) LV vol d, MOD A2C: 151.0 ml LV vol d, MOD A4C: 145.0 ml LV vol s, MOD A2C: 102.0 ml LV vol s, MOD A4C: 126.0 ml LV SV MOD A2C:     49.0 ml LV SV MOD A4C:     145.0 ml LV SV MOD BP:      34.0 ml RIGHT VENTRICLE RV Basal diam:  3.00 cm RV Mid diam:    2.10 cm RV S prime:     15.60 cm/s LEFT ATRIUM           Index        RIGHT ATRIUM           Index LA diam:      4.60 cm 2.70 cm/m   RA Area:     10.50 cm LA Vol (A2C): 42.1 ml 24.74 ml/m  RA Volume:   19.70 ml  11.58 ml/m LA Vol (A4C): 59.1 ml 34.74 ml/m   AORTA Ao Root diam: 3.00 cm TRICUSPID VALVE TR Peak grad:   38.9 mmHg TR Vmax:        312.00 cm/s  SHUNTS Systemic Diam: 2.00 cm Dina Rich MD Electronically signed by Dina Rich MD Signature Date/Time: 08/11/2022/1:02:42 PM    Final    US Venous Img Lower Bilateral (DVT)  Result Date: 08/11/2022 CLINICAL DATA:  Pulmonary embolism. EXAM: BILATERAL LOWER EXTREMITY VENOUS DOPPLER ULTRASOUND TECHNIQUE: Gray-scale sonography with graded compression, as well as color Doppler and duplex ultrasound were performed to evaluate the lower extremity deep venous systems from the level of the common femoral vein and including the common femoral, femoral, profunda femoral, popliteal and calf veins including the posterior tibial, peroneal and gastrocnemius veins when visible. The superficial great saphenous vein was also interrogated. Spectral Doppler was utilized to evaluate flow at rest and with distal augmentation maneuvers in the common femoral, femoral and popliteal veins. COMPARISON:  None Available. FINDINGS: RIGHT LOWER EXTREMITY Common Femoral Vein: No evidence of thrombus. Normal compressibility, respiratory phasicity and response to augmentation. Saphenofemoral Junction: No evidence of thrombus. Normal compressibility and flow on  color Doppler imaging. Profunda Femoral Vein: No evidence of thrombus. Normal compressibility and flow on color Doppler imaging. Femoral Vein: No evidence of thrombus. Normal compressibility, respiratory phasicity and response to augmentation. Popliteal Vein: No evidence of thrombus. Normal compressibility, respiratory phasicity and response to augmentation. Calf Veins: Thrombus visualized in the posterior tibial and peroneal veins in the calf. Superficial Great Saphenous Vein: Superficial thrombophlebitis of the great saphenous vein at the level of the knee extending into some communicating superficial varicosities. Venous Reflux:  None. Other Findings:  None. LEFT LOWER EXTREMITY Common Femoral Vein: No evidence of thrombus. Normal compressibility, respiratory phasicity and response to augmentation. Saphenofemoral Junction: No evidence of thrombus. Normal compressibility and flow on color Doppler imaging. Profunda Femoral Vein: No evidence of thrombus. Normal compressibility and flow on color Doppler imaging. Femoral Vein: No evidence of thrombus. Normal compressibility, respiratory phasicity and response to augmentation. Popliteal Vein: Nonocclusive thrombus in the left popliteal vein. Calf Veins: Thrombus visualized in the left peroneal vein. Posterior tibial and anterior tibial veins demonstrate normal patency. Superficial Great Saphenous Vein: No evidence of thrombus.  Normal compressibility. Venous Reflux:  None. Other Findings: No evidence of superficial thrombophlebitis or abnormal fluid collection. IMPRESSION: 1. Positive for deep vein thrombosis in the right calf veins and in the left popliteal and peroneal veins. 2. Superficial thrombophlebitis of the right great saphenous vein at the level of the knee extending into some communicating superficial varicosities. Electronically Signed   By: Irish Lack M.D.   On: 08/11/2022 08:53   US Abdomen Limited RUQ (LIVER/GB)  Result Date: 08/11/2022 CLINICAL  DATA:  Transaminasemia. Status post cholecystectomy and around 2005. EXAM: ULTRASOUND ABDOMEN LIMITED RIGHT UPPER QUADRANT COMPARISON:  CT chest, abdomen, and pelvis 07/29/2022 FINDINGS: Gallbladder: The gallbladder is surgically absent. Common bile duct: Diameter: 2 mm, within normal limits. No intrahepatic or extrahepatic biliary ductal dilatation. Liver: Smooth liver contours. Mildly increased hepatic echogenicity. Mildly heterogeneous hepatic echotexture. Portal vein is patent on color Doppler imaging with normal direction of blood flow towards the liver. Other: None. IMPRESSION: 1. Mildly increased hepatic echogenicity, most compatible with hepatic steatosis. Low-density suggesting hepatic steatosis was also seen on recent CT. 2. Status post cholecystectomy. No biliary ductal dilatation. Electronically Signed   By: Neita Garnet M.D.   On: 08/11/2022 08:51   CT Angio Chest PE W/Cm &/Or Wo Cm  Result Date: 08/10/2022 CLINICAL DATA:  Concern for pulmonary embolism. EXAM: CT ANGIOGRAPHY CHEST WITH CONTRAST TECHNIQUE: Multidetector CT imaging of the chest was performed using the standard protocol during bolus administration of intravenous contrast. Multiplanar CT image reconstructions and MIPs were obtained to evaluate the vascular anatomy. RADIATION DOSE REDUCTION: This exam was performed according to the departmental dose-optimization program which includes automated exposure control, adjustment of the mA and/or kV according to patient size and/or use of iterative reconstruction technique. CONTRAST:  47mL OMNIPAQUE IOHEXOL 350 MG/ML SOLN COMPARISON:  Chest radiograph dated 08/10/2022 and CT dated 07/29/2022. FINDINGS: Evaluation of this exam is limited due to respiratory motion artifact. Cardiovascular: There is mild cardiomegaly with mild dilatation of the left cardiac chambers. No pericardial effusion. Mild atherosclerotic calcification of the thoracic aorta. No aneurysmal dilatation or dissection.  Evaluation of the pulmonary arteries is limited due to respiratory motion. Small left upper lobe segmental pulmonary artery emboli noted. No CT evidence of right heart straining. Mediastinum/Nodes: No hilar or mediastinal adenopathy. The esophagus is grossly unremarkable. No mediastinal fluid collection. Lungs/Pleura: Small left pleural effusion. There is left lung base partial compressive atelectasis or infiltrate. Overall improved aeration of the left lower lobe compared to prior CT. Clusters of airspace opacity in the right lower lobe most consistent with pneumonia. There may be areas of early cavitary change at the right lung base. Attention on follow-up imaging recommended. Small subpleural nodule in the superior segment of the left lower lobe, likely post inflammatory and residual from prior infiltration. There is a 5 mm nodule or para fissural lymph node along the left fissure (71/6). There is no pneumothorax. The central airways are patent. Upper Abdomen: Cholecystectomy. Musculoskeletal: Osteopenia with degenerative changes of the spine. No acute osseous pathology. Review of the MIP images confirms the above findings. IMPRESSION: 1. Small left upper lobe segmental pulmonary artery emboli. No CT evidence of right heart straining. 2. Right lower lobe pneumonia. There may be areas of early cavitary change at the right lung base. Attention on follow-up imaging recommended. 3. Small left pleural effusion with left lung base partial compressive atelectasis or infiltrate. Overall improved aeration of the left lower lobe compared to prior CT. 4. Mild cardiomegaly with mild dilatation of  the left cardiac chambers. 5. A 5 mm nodule or para fissural lymph node along the left fissure. 6.  Aortic Atherosclerosis (ICD10-I70.0). These results were called by telephone at the time of interpretation on 08/10/2022 at 10:43 pm to provider Garnette Gunner , who verbally acknowledged these results. Electronically Signed   By:  Anner Crete M.D.   On: 08/10/2022 22:44   DG Chest 2 View  Result Date: 08/10/2022 CLINICAL DATA:  Suspected Sepsis EXAM: CHEST - 2 VIEW COMPARISON:  Chest x-ray 08/01/2022, CT chest 07/29/2022 FINDINGS: The heart and mediastinal contours are unchanged. Question right lower lung zone airspace opacity versus hilar lymph node. Retrocardiac airspace opacity. No pulmonary edema. Small left pleural effusion. No pneumothorax. No acute osseous abnormality. IMPRESSION: 1. Small left pleural effusion. 2. Retrocardiac airspace opacity may represent a combination of infection/inflammation versus atelectasis. 3. Question right lower lung zone airspace opacity versus hilar lymph node. Electronically Signed   By: Iven Finn M.D.   On: 08/10/2022 19:10   DG Chest Port 1 View  Result Date: 08/01/2022 CLINICAL DATA:  Fever EXAM: PORTABLE CHEST 1 VIEW COMPARISON:  Chest radiograph 07/31/2022 FINDINGS: No pleural effusion. No pneumothorax. Normal lung volumes. Unchanged cardiac and mediastinal contours. Compared to prior exam there is slight interval increase in conspicuity of a retrocardiac opacity, which could represent atelectasis, but infection is not excluded. No displaced rib fractures. The visualized upper abdomen is unremarkable. IMPRESSION: Compared to prior exam there is slight interval increase in conspicuity of a retrocardiac opacity, which could represent atelectasis, but infection is not excluded. Electronically Signed   By: Marin Roberts M.D.   On: 08/01/2022 09:49   DG Chest Port 1 View  Result Date: 07/31/2022 CLINICAL DATA:  Bibasilar crackles. EXAM: PORTABLE CHEST 1 VIEW COMPARISON:  07/31/2022 FINDINGS: Interval removal of enteric and endotracheal tubes. Mild cardiac enlargement. No vascular congestion or edema. Lung bases and costophrenic angles are clear. No pneumothorax. IMPRESSION: Cardiac enlargement.  Lungs appear clear postextubation. Electronically Signed   By: Lucienne Capers M.D.    On: 07/31/2022 23:34   DG Chest Port 1 View  Result Date: 07/31/2022 CLINICAL DATA:  Acute respiratory failure EXAM: PORTABLE CHEST 1 VIEW COMPARISON:  Chest 07/30/2022 FINDINGS: Endotracheal tube 2.5 cm above the carina. NG tube in the stomach with the tip not visualized. Left lower lobe consolidation with mild progression. Mild right lower lobe airspace disease with progression. IMPRESSION: 1. Endotracheal tube in good position. 2. Progression of bibasilar airspace disease left greater than right. Electronically Signed   By: Franchot Gallo M.D.   On: 07/31/2022 14:01   DG Chest Port 1 View  Result Date: 07/30/2022 CLINICAL DATA:  Acute on chronic respiratory failure with hypoxemia. EXAM: PORTABLE CHEST 1 VIEW COMPARISON:  07/29/2022 FINDINGS: ET tube tip is above the carina. Enteric tube tip courses below the level of the hemidiaphragms. Unchanged asymmetric elevation of the right hemidiaphragm. Persistent airspace disease within the left lower lobe which appears unchanged from the previous exam. Decreased interstitial edema pattern. IMPRESSION: 1. Stable support apparatus. 2. Persistent left lower lobe airspace disease. 3. Decreased interstitial edema pattern. Electronically Signed   By: Kerby Moors M.D.   On: 07/30/2022 08:54   ECHOCARDIOGRAM COMPLETE  Result Date: 07/29/2022    ECHOCARDIOGRAM REPORT   Patient Name:   Summer Christensen Villatoro Date of Exam: 07/29/2022 Medical Rec #:  [id number]     Height:       64.0 in Accession #:    [id number]  Weight:       145.7 lb Date of Birth:  [birth year]     BSA:          1.710 m Patient Age:    70 years      BP:           102/69 mmHg Patient Gender: F             HR:           95 bpm. [facility] Procedure: 2D Echo, Color Doppler, Cardiac Doppler and Intracardiac            Opacification Agent Indications:    I50.21 Acute systolic (congestive) heart failure  History:        Patient has prior history of Echocardiogram examinations, most                  recent 01/08/2011. Arrythmias:LBBB; Risk Factors:Hypertension and                 Diabetes.  Sonographer:    Irving Burton Senior RDCS Referring Phys: [id number] OLADAPO ADEFESO  Sonographer Comments: Scanned supine on artificial respirator. IMPRESSIONS  1. Left ventricular ejection fraction, by estimation, is <20%. The left ventricle has severely decreased function. The left ventricle demonstrates global hypokinesis. The left ventricular internal cavity size was moderately dilated. Left ventricular diastolic parameters are consistent with Grade III diastolic dysfunction (restrictive).  2. Right ventricular systolic function is normal. The right ventricular size is normal.  3. The mitral valve is normal in structure. Mild to moderate mitral valve regurgitation. No evidence of mitral stenosis.  4. The aortic valve is tricuspid. Aortic valve regurgitation is not visualized. No aortic stenosis is present.  5. The inferior vena cava is normal in size with greater than 50% respiratory variability, suggesting right atrial pressure of 3 mmHg. FINDINGS  Left Ventricle: Left ventricular ejection fraction, by estimation, is <20%. The left ventricle has severely decreased function. The left ventricle demonstrates global hypokinesis. Definity contrast agent was given IV to delineate the left ventricular endocardial borders. The left ventricular internal cavity size was moderately dilated. There is no left ventricular hypertrophy. Abnormal (paradoxical) septal motion, consistent with left bundle branch block. Left ventricular diastolic parameters are consistent with Grade III diastolic dysfunction (restrictive). Right Ventricle: The right ventricular size is normal. No increase in right ventricular wall thickness. Right ventricular systolic function is normal. Left Atrium: Left atrial size was normal in size. Right Atrium: Right atrial size was normal in size. Pericardium: There is no evidence of pericardial effusion. Mitral Valve:  The mitral valve is normal in structure. Mild to moderate mitral valve regurgitation. No evidence of mitral valve stenosis. Tricuspid Valve: The tricuspid valve is normal in structure. Tricuspid valve regurgitation is not demonstrated. No evidence of tricuspid stenosis. Aortic Valve: The aortic valve is tricuspid. Aortic valve regurgitation is not visualized. No aortic stenosis is present. Pulmonic Valve: The pulmonic valve was normal in structure. Pulmonic valve regurgitation is mild. No evidence of pulmonic stenosis. Aorta: The aortic root is normal in size and structure. Venous: The inferior vena cava is normal in size with greater than 50% respiratory variability, suggesting right atrial pressure of 3 mmHg. IAS/Shunts: No atrial level shunt detected by color flow Doppler.  LEFT VENTRICLE PLAX 2D LVIDd:         5.90 cm      Diastology LVIDs:         4.50 cm      LV  e' medial:    4.90 cm/s LV PW:         1.00 cm      LV E/e' medial:  20.8 LV IVS:        0.90 cm      LV e' lateral:   7.94 cm/s LVOT diam:     1.90 cm      LV E/e' lateral: 12.8 LV SV:         30 LV SV Index:   18 LVOT Area:     2.84 cm  LV Volumes (MOD) LV vol d, MOD A2C: 131.0 ml LV vol d, MOD A4C: 114.0 ml LV vol s, MOD A2C: 114.0 ml LV vol s, MOD A4C: 102.0 ml LV SV MOD A2C:     17.0 ml LV SV MOD A4C:     114.0 ml LV SV MOD BP:      14.8 ml RIGHT VENTRICLE RV S prime:     9.03 cm/s TAPSE (M-mode): 1.9 cm LEFT ATRIUM             Index        RIGHT ATRIUM           Index LA diam:        4.00 cm 2.34 cm/m   RA Area:     12.70 cm LA Vol (A2C):   56.3 ml 32.92 ml/m  RA Volume:   26.30 ml  15.38 ml/m LA Vol (A4C):   39.7 ml 23.22 ml/m LA Biplane Vol: 48.8 ml 28.54 ml/m  AORTIC VALVE LVOT Vmax:   92.70 cm/s LVOT Vmean:  62.300 cm/s LVOT VTI:    0.106 m  AORTA Ao Root diam: 3.00 cm Ao Asc diam:  3.30 cm MITRAL VALVE MV Area (PHT): 5.50 cm     SHUNTS MV Decel Time: 138 msec     Systemic VTI:  0.11 m MV E velocity: 102.00 cm/s  Systemic Diam: 1.90  cm MV A velocity: 30.00 cm/s MV E/A ratio:  3.40 Donato Schultz MD Electronically signed by Donato Schultz MD Signature Date/Time: 07/29/2022/10:33:16 AM    Final    DG Chest Portable 1 View  Result Date: 07/29/2022 CLINICAL DATA:  NG tube placement. EXAM: PORTABLE CHEST 1 VIEW COMPARISON:  07/29/2022 FINDINGS: 0451 hours. Endotracheal tube tip is 1.3 cm above the base of the carina. The NG tube passes into the stomach although the distal tip position is not included on the film. The cardio pericardial silhouette is enlarged. Vascular congestion noted with underlying chronic interstitial coarsening. Mild bibasilar atelectasis noted without substantial pleural effusion. Telemetry leads overlie the chest. IMPRESSION: Endotracheal tube tip is 1.3 cm above the base of the carina. NG tube passes into the stomach. Cardiomegaly with vascular congestion and basilar atelectasis. Electronically Signed   By: Kennith Center M.D.   On: 07/29/2022 05:30   CT Angio Chest/Abd/Pel for Dissection W and/or Wo Contrast  Addendum Date: 07/29/2022   ADDENDUM REPORT: 07/29/2022 05:09 ADDENDUM: ETT position and other salient findings discussed by telephone with Dr. Geoffery Lyons on 07/29/2022 at 0501 hours. Electronically Signed   By: Odessa Fleming M.D.   On: 07/29/2022 05:09   Result Date: 07/29/2022 CLINICAL DATA:  71 year old female unresponsive at home. Altered mental status. Intubated. EXAM: CT ANGIOGRAPHY CHEST, ABDOMEN AND PELVIS TECHNIQUE: Non-contrast CT of the chest was initially obtained. Multidetector CT imaging through the chest, abdomen and pelvis was performed using the standard protocol during bolus administration of intravenous contrast. Multiplanar reconstructed images  and MIPs were obtained and reviewed to evaluate the vascular anatomy. RADIATION DOSE REDUCTION: This exam was performed according to the departmental dose-optimization program which includes automated exposure control, adjustment of the mA and/or kV  according to patient size and/or use of iterative reconstruction technique. CONTRAST:  OMNIPAQUE IOHEXOL 350 MG/ML SOLN COMPARISON:  Portable chest 0347 hours today. CTA chest 01/06/2011. CT Abdomen and Pelvis 12/25/2021. FINDINGS: CTA CHEST FINDINGS Cardiovascular: Calcified aortic atherosclerosis. Calcified coronary artery atherosclerosis. Mild cardiomegaly appears stable since March. No pericardial effusion. Following contrast there is no thoracic aorta dissection. Incidental cardiac pulsation artifact. No thoracic aortic aneurysm. Also, the central pulmonary arteries are enhancing and appear to be patent. Mediastinum/Nodes: Negative. Lungs/Pleura: Intubated, endotracheal tube tip terminates just at the right mainstem bronchus on series 10, image 72. Atelectatic changes to the airways at the carina. Dependent, confluent pulmonary consolidation in both lower lobes, greater on the left. Early dependent upper lobe involvement. Small pulmonary arterial branches appear to be enhancing in the areas of consolidation. Superimposed perihilar and centrilobular pulmonary ground-glass opacity in both lungs with some septal thickening. Trace layering pleural effusions. Musculoskeletal: Respiratory motion at the sternum and ribs. No acute osseous abnormality identified. Review of the MIP images confirms the above findings. CTA ABDOMEN AND PELVIS FINDINGS VASCULAR Mild Aortoiliac calcified atherosclerosis. Negative for abdominal aortic aneurysm or dissection. Major arterial structures are patent. Portal venous system also appears to be patent. Review of the MIP images confirms the above findings. NON-VASCULAR Hepatobiliary: Chronically absent gallbladder. Liver appears stable and negative when allowing for motion artifact. Pancreas: Stable, negative. Spleen: Within normal limits. Adrenals/Urinary Tract: Stable, negative adrenal glands. Asymmetric right renal cortical scarring and volume loss. Symmetric renal enhancement.  Motion artifact. No convincing hydronephrosis or pararenal inflammation. Ureters appear decompressed. No obvious urinary calculus. Unremarkable bladder. Stomach/Bowel: Intermittent motion artifact. Redundant but decompressed large bowel except for retained stool in the transverse colon. Normal retrocecal appendix on series 6, image 152. No dilated small bowel. Small volume of air in fluid in the stomach. Negative duodenum. No free air, free fluid, or mesenteric inflammation identified. Lymphatic: No lymphadenopathy. Reproductive: Chronically absent uterus, diminutive or absent ovaries. Other: No pelvic free fluid. Musculoskeletal: No acute osseous abnormality identified. Review of the MIP images confirms the above findings. IMPRESSION: 1. Intubated, endotracheal tube tip terminates just inside the right mainstem bronchus. Recommend retracting 1 cm. 2. Negative for aortic dissection or aneurysm. Aortic Atherosclerosis (ICD10-I70.0). 3. Positive for dependent consolidation in both lungs, especially lower lobes greater on the left. This is most compatible with Pneumonia Versus Aspiration. 4. Trace layering pleural effusions and additional central pulmonary ground-glass opacity with septal thickening raising the possibility of mild or developing interstitial edema. Stable cardiomegaly. No pericardial effusion. 5. No acute or inflammatory process identified in the abdomen or pelvis. Chronic right renal cortical scarring. Electronically Signed: By: Odessa Fleming M.D. On: 07/29/2022 04:57   CT Head Wo Contrast  Result Date: 07/29/2022 CLINICAL DATA:  71 year old female unresponsive at home. Altered mental status. Intubated. EXAM: CT HEAD WITHOUT CONTRAST TECHNIQUE: Contiguous axial images were obtained from the base of the skull through the vertex without intravenous contrast. RADIATION DOSE REDUCTION: This exam was performed according to the departmental dose-optimization program which includes automated exposure control,  adjustment of the mA and/or kV according to patient size and/or use of iterative reconstruction technique. COMPARISON:  Head CT 08/03/2009. FINDINGS: Brain: Cerebral volume is within normal limits for age. No midline shift, ventriculomegaly, mass effect, evidence of mass  lesion, intracranial hemorrhage or evidence of cortically based acute infarction. Gray-white matter differentiation is within normal limits for age. No convincing encephalomalacia. Vascular: Calcified atherosclerosis at the skull base. No suspicious intracranial vascular hyperdensity. Skull: Stable.  No acute osseous abnormality identified. Sinuses/Orbits: Visualized paranasal sinuses and mastoids are stable and well aerated. Other: Visualized orbits and scalp soft tissues are within normal limits. IMPRESSION: Normal for age non contrast CT appearance of the brain, head. Electronically Signed   By: Odessa Fleming M.D.   On: 07/29/2022 04:48   DG Chest Portable 1 View  Result Date: 07/29/2022 CLINICAL DATA:  71 year old female with shortness of breath. EXAM: PORTABLE CHEST 1 VIEW COMPARISON:  Chest radiographs 12/31/2011 and earlier. CT Abdomen and Pelvis 12/25/2021. FINDINGS: Portable AP semi upright view at 0347 hours. Cardiomegaly, new since 2013 but appears stable from the CT Abdomen and Pelvis this year. Other mediastinal contours are within normal limits. Visualized tracheal air column is within normal limits. Lung volumes are within normal limits. No pneumothorax, pleural effusion or consolidation. Mild streaky bilateral perihilar opacity. Increased pulmonary vascularity from prior radiographs, but no overt edema. No acute osseous abnormality identified. Paucity of bowel gas in the upper abdomen. IMPRESSION: 1. Cardiomegaly with vascular congestion but no overt edema. 2. Streaky bilateral perihilar opacity, nonspecific but favor atelectasis. Electronically Signed   By: Odessa Fleming M.D.   On: 07/29/2022 04:00     Subjective: Pt sitting up and  ambulating in room, reports chest pain have been improving  Discharge Exam: Vitals:   08/15/22 0503 08/15/22 1457  BP: 118/68 116/68  Pulse: 77 76  Resp: 16 17  Temp: 98.6 F (37 C) 98.7 F (37.1 C)  SpO2: 97% 97%   Vitals:   08/14/22 1703 08/14/22 2118 08/15/22 0503 08/15/22 1457  BP: 130/66 135/62 118/68 116/68  Pulse: 85 74 77 76  Resp:  16 16 17   Temp:  98.7 F (37.1 C) 98.6 F (37 C) 98.7 F (37.1 C)  TempSrc:  Oral Oral Oral  SpO2:  93% 97% 97%  Weight:      Height:        General: Pt is alert, awake, not in acute distress Cardiovascular: RRR, S1/S2 +, no rubs, no gallops Respiratory: CTA bilaterally, no wheezing, no rhonchi Abdominal: Soft, NT, ND, bowel sounds + Extremities: no edema, no cyanosis   The results of significant diagnostics from this hospitalization (including imaging, microbiology, ancillary and laboratory) are listed below for reference.     Microbiology: Recent Results (from the past 240 hour(s))  Culture, blood (Routine x 2)     Status: None   Collection Time: 08/10/22  6:57 PM   Specimen: Left Antecubital; Blood  Result Value Ref Range Status   Specimen Description LEFT ANTECUBITAL  Final   Special Requests   Final    BOTTLES DRAWN AEROBIC AND ANAEROBIC Blood Culture adequate volume   Culture   Final    NO GROWTH 5 DAYS Performed at [facility], [address] [place], [place] [address]    Report Status 08/15/2022 FINAL  Final  Culture, blood (Routine x 2)     Status: None   Collection Time: 08/10/22  7:09 PM   Specimen: BLOOD RIGHT FOREARM  Result Value Ref Range Status   Specimen Description BLOOD RIGHT FOREARM  Final   Special Requests   Final    BOTTLES DRAWN AEROBIC AND ANAEROBIC Blood Culture adequate volume   Culture   Final    NO GROWTH 5  DAYS Performed at [facility], [address], [place], [place] [address]    Report Status 08/15/2022 FINAL  Final  SARS Coronavirus 2 by RT PCR (hospital order, performed in [facility] hospital lab) *cepheid single result test* Anterior Nasal Swab     Status: None   Collection Time: 08/11/22  8:06 AM   Specimen: Anterior Nasal Swab  Result Value Ref Range Status   SARS Coronavirus 2 by RT PCR NEGATIVE NEGATIVE Final    Comment: (NOTE) SARS-CoV-2 target nucleic acids are NOT DETECTED.  The SARS-CoV-2 RNA is generally detectable in upper and lower respiratory specimens during the acute phase of infection. The lowest concentration of SARS-CoV-2 viral copies this assay can detect is 250 copies / mL. A negative result does not preclude SARS-CoV-2 infection and should not be used as the sole basis for treatment or other patient management decisions.  A negative result may occur with improper specimen collection / handling, submission of specimen other than nasopharyngeal swab, presence of viral mutation(s) within the areas targeted by this assay, and inadequate number of viral copies (<250 copies / mL). A negative result must be combined with clinical observations, patient history, and epidemiological information.  Fact Sheet for Patients:   RoadLapTop.co.za  Fact Sheet for Healthcare Providers: http://kim-miller.com/  This test is not yet approved or  cleared by the [place] FDA and has been authorized for detection and/or diagnosis of SARS-CoV-2 by FDA under an Emergency Use Authorization (EUA).  This EUA will remain in effect (meaning this test can be used) for the duration of the COVID-19 declaration under Section 564(b)(1) of the Act, 21 U.S.C. section 360bbb-3(b)(1), unless the authorization is terminated or revoked sooner.  Performed at [facility], [address] [place], [place] [address]   MRSA Next Gen by PCR, Nasal     Status: None   Collection Time: 08/12/22 12:09 PM   Specimen: Nasal Mucosa; Nasal Swab  Result Value Ref Range Status   MRSA by PCR Next Gen NOT DETECTED NOT DETECTED Final     Comment: (NOTE) The GeneXpert MRSA Assay (FDA approved for NASAL specimens only), is one component of a comprehensive MRSA colonization surveillance program. It is not intended to diagnose MRSA infection nor to guide or monitor treatment for MRSA infections. Test performance is not FDA approved in patients less than 33 years old. Performed at [facility], [address], [place], [place] [address]      Labs: BNP (last 3 results) Recent Labs    07/29/22 0345 08/10/22 1930  BNP 1,157.0* 264.0*   Basic Metabolic Panel: Recent Labs  Lab 08/10/22 1856 08/11/22 0343 08/12/22 0515 08/13/22 0444  NA 136 137 137 138  K 3.7 4.0 4.5 4.0  CL 102 102 103 107  CO2 24 25 25 23   GLUCOSE 151* 141* 132* 157*  BUN 19 18 23  29*  CREATININE 1.20* 0.94 1.31* 1.17*  CALCIUM 9.9 9.4 9.7 9.9  MG  --  2.3  --   --   PHOS  --  4.3  --   --    Liver Function Tests: Recent Labs  Lab 08/10/22 1856 08/11/22 0343 08/12/22 0515  AST 20 247* 74*  ALT 33 107* 101*  ALKPHOS 90 126 130*  BILITOT 0.5 0.8 0.7  PROT 8.4* 7.0 6.8  ALBUMIN 3.9 3.3* 3.0*   No results for input(s): "LIPASE", "AMYLASE" in the last 168 hours. No results for input(s): "AMMONIA" in the last 168 hours. CBC: Recent Labs  Lab 08/10/22 1856 08/11/22 0343 08/12/22 0515 08/13/22 0444 08/14/22 0337  WBC 20.0* 19.5* 22.2* 19.6* 15.7*  NEUTROABS 15.9*  --   --   --   --   HGB 12.3 12.2 10.6* 10.6* 10.0*  HCT 37.2 37.5 33.0* 32.7* 30.9*  MCV 84.9 86.2 87.3 87.0 87.3  PLT 450* 398 398 403* 410*   Cardiac Enzymes: No results for input(s): "CKTOTAL", "CKMB", "CKMBINDEX", "TROPONINI" in the last 168 hours. BNP: Invalid input(s): "POCBNP" CBG: Recent Labs  Lab 08/11/22 0028 08/11/22 0721 08/11/22 1141 08/13/22 1613  GLUCAP 138* 109* 109* 109*   D-Dimer No results for input(s): "DDIMER" in the last 72 hours. Hgb A1c No results for input(s): "HGBA1C" in the last 72 hours. Lipid Profile No results for input(s):  "CHOL", "HDL", "LDLCALC", "TRIG", "CHOLHDL", "LDLDIRECT" in the last 72 hours. Thyroid function studies No results for input(s): "TSH", "T4TOTAL", "T3FREE", "THYROIDAB" in the last 72 hours.  Invalid input(s): "FREET3" Anemia work up No results for input(s): "VITAMINB12", "FOLATE", "FERRITIN", "TIBC", "IRON", "RETICCTPCT" in the last 72 hours. Urinalysis    Component Value Date/Time   COLORURINE YELLOW 08/11/2022 0545   APPEARANCEUR CLEAR 08/11/2022 0545   APPEARANCEUR Clear 11/20/2017 1200   LABSPEC 1.029 08/11/2022 0545   PHURINE 6.0 08/11/2022 0545   GLUCOSEU >=500 (A) 08/11/2022 0545   HGBUR NEGATIVE 08/11/2022 0545   BILIRUBINUR NEGATIVE 08/11/2022 0545   BILIRUBINUR Negative 11/20/2017 1200   KETONESUR NEGATIVE 08/11/2022 0545   PROTEINUR NEGATIVE 08/11/2022 0545   UROBILINOGEN 0.2 11/14/2010 1850   NITRITE NEGATIVE 08/11/2022 0545   LEUKOCYTESUR TRACE (A) 08/11/2022 0545   Sepsis Labs Recent Labs  Lab 08/11/22 0343 08/12/22 0515 08/13/22 0444 08/14/22 0337  WBC 19.5* 22.2* 19.6* 15.7*   Microbiology Recent Results (from the past 240 hour(s))  Culture, blood (Routine x 2)     Status: None   Collection Time: 08/10/22  6:57 PM   Specimen: Left Antecubital; Blood  Result Value Ref Range Status   Specimen Description LEFT ANTECUBITAL  Final   Special Requests   Final    BOTTLES DRAWN AEROBIC AND ANAEROBIC Blood Culture adequate volume   Culture   Final    NO GROWTH 5 DAYS Performed at [facility], [address] [place], [place] [address]    Report Status 08/15/2022 FINAL  Final  Culture, blood (Routine x 2)     Status: None   Collection Time: 08/10/22  7:09 PM   Specimen: BLOOD RIGHT FOREARM  Result Value Ref Range Status   Specimen Description BLOOD RIGHT FOREARM  Final   Special Requests   Final    BOTTLES DRAWN AEROBIC AND ANAEROBIC Blood Culture adequate volume   Culture   Final    NO GROWTH 5 DAYS Performed at [facility], [address]  [place], [place] [address]    Report Status 08/15/2022 FINAL  Final  SARS Coronavirus 2 by RT PCR (hospital order, performed in [facility] hospital lab) *cepheid single result test* Anterior Nasal Swab     Status: None   Collection Time: 08/11/22  8:06 AM   Specimen: Anterior Nasal Swab  Result Value Ref Range Status   SARS Coronavirus 2 by RT PCR NEGATIVE NEGATIVE Final    Comment: (NOTE) SARS-CoV-2 target nucleic acids are NOT DETECTED.  The SARS-CoV-2 RNA is generally detectable in upper and lower respiratory specimens during the acute phase of infection. The lowest concentration of SARS-CoV-2 viral copies this assay can detect is 250 copies / mL. A  negative result does not preclude SARS-CoV-2 infection and should not be used as the sole basis for treatment or other patient management decisions.  A negative result may occur with improper specimen collection / handling, submission of specimen other than nasopharyngeal swab, presence of viral mutation(s) within the areas targeted by this assay, and inadequate number of viral copies (<250 copies / mL). A negative result must be combined with clinical observations, patient history, and epidemiological information.  Fact Sheet for Patients:   RoadLapTop.co.za  Fact Sheet for Healthcare Providers: http://kim-miller.com/  This test is not yet approved or  cleared by the [place] FDA and has been authorized for detection and/or diagnosis of SARS-CoV-2 by FDA under an Emergency Use Authorization (EUA).  This EUA will remain in effect (meaning this test can be used) for the duration of the COVID-19 declaration under Section 564(b)(1) of the Act, 21 U.S.C. section 360bbb-3(b)(1), unless the authorization is terminated or revoked sooner.  Performed at [facility], [address], [place], [place] [address]   MRSA Next Gen by PCR, Nasal     Status: None   Collection Time: 08/12/22 12:09 PM    Specimen: Nasal Mucosa; Nasal Swab  Result Value Ref Range Status   MRSA by PCR Next Gen NOT DETECTED NOT DETECTED Final    Comment: (NOTE) The GeneXpert MRSA Assay (FDA approved for NASAL specimens only), is one component of a comprehensive MRSA colonization surveillance program. It is not intended to diagnose MRSA infection nor to guide or monitor treatment for MRSA infections. Test performance is not FDA approved in patients less than 21 years old. Performed at [facility], [address], [place], [place] [address]     Time coordinating discharge: 43 mins  SIGNED:  Standley Dakins, MD  [facility] 08/15/2022, 3:02 PM How to contact the [facility] Attending or Consulting provider 7A - 7P or covering provider during after hours 7P -7A, for this patient?  Check the care team in [facility] and look for a) attending/consulting [facility] provider listed and b) the [facility] team listed Log into [email or web address] and use [facility] universal password to access. If you do not have the password, please contact the hospital operator. Locate the [facility] provider you are looking for under [facility] and page to a number that you can be directly reached. If you still have difficulty reaching the provider, please page the [facility] (Director on Call) for the Hospitalists listed on amion for assistance.

## 2022-08-14 NOTE — Progress Notes (Signed)
Pt assisted back to bed after lunch. C/o increased pain with movement in left chest area. Dr. Melvyn Novas in to see pt, new orders placed. Pt informed that discharge order for today is cancelled per MDs [facility]. Pt stated understanding.

## 2022-08-15 LAB — CULTURE, BLOOD (ROUTINE X 2)
Culture: NO GROWTH
Culture: NO GROWTH
Special Requests: ADEQUATE
Special Requests: ADEQUATE

## 2022-08-15 MED ORDER — GABAPENTIN 300 MG PO CAPS: 300.0000 mg | ORAL_CAPSULE | Freq: Three times a day (TID) | ORAL | 0 refills | Status: DC

## 2022-08-15 MED ORDER — OXYCODONE HCL 5 MG PO TABS: 5.0000 mg | ORAL_TABLET | Freq: Four times a day (QID) | ORAL | 0 refills | Status: AC | PRN

## 2022-08-15 MED ORDER — PREDNISONE 20 MG PO TABS: ORAL_TABLET | ORAL | 0 refills | Status: AC

## 2022-08-15 NOTE — Progress Notes (Addendum)
08/15/2022 3:06 PM   DC was held 10/26 due to pleuritic chest pain symptoms.  Dr. Melvyn Novas put patient on new regimen and symptoms are better now.  I discussed with him and patient can discharge home today.  DC summary updated.   Murvin Natal MD   How to contact the [facility] Attending or Consulting provider [place] or covering provider during after hours [place], for this patient?  Check the care team in [place] and look for a) attending/consulting [facility] provider listed and b) the [facility] team listed Log into [email or web address] and use [place]'s universal password to access. If you do not have the password, please contact the hospital operator. Locate the [facility] provider you are looking for under [facility] and page to a number that you can be directly reached. If you still have difficulty reaching the provider, please page the [facility] (Director on Call) for the Hospitalists listed on amion for assistance.

## 2022-08-15 NOTE — Progress Notes (Signed)
NAME:  Summer Christensen, MRN:  [id number], DOB:  [birth year], LOS: 5 ADMISSION DATE:  08/10/2022, CONSULTATION DATE:  10/24  REFERRING MD:  Tat   CHIEF COMPLAINT:  cough /cp    History of Present Illness:  31 yobf never smoker s/p ET 10/0-10/12 for likely Sys chf complicated by ? Aspiration with nl flora on ET specimen and persistent LLL decreased in aeration at time of d/c on 10/16 on 2lpm and persistent severe dry coughing fits since d/c  then 10/18 developed  L  pleuritic cp.    Pertinent  Medical History   71 y.o. 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  Pain was constant and was rated as 10/10 on pain scale which worsens on coughing.  She states that she has not been moving around much since being discharged on 10/16.  She denies fever, chills, nausea, vomiting, abdominal pain.  Patient was admitted from 10/10 to 10/16 due to acute respiratory failure with hypoxia, acute CHF (systolic and diastolic) and was discharged with Lasix, Coreg, Farxiga, spironolactone and ARB.  Significant Hospital Events: Including procedures, antibiotic start and stop dates in addition to other pertinent events   CTa  10/22 1. Small left upper lobe segmental pulmonary artery emboli. No CT evidence of right heart straining. 2. Right lower lobe pneumonia. There may be areas of early cavitary change at the right lung base. 3. Small left pleural effusion with left lung base partial compressive atelectasis or infiltrate. Overall improved aeration of the left lower lobe compared to prior CT. 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  -  Rib detail on L  :   Neg fx  Scheduled Meds:  apixaban  10 mg Oral BID   Followed  by   Derrill Memo ON 08/20/2022] apixaban  5 mg Oral BID   aspirin EC  81 mg Oral Daily   atorvastatin  40 mg Oral Daily   carvedilol  3.125 mg Oral BID WC   dextromethorphan-guaiFENesin  2 tablet Oral BID   empagliflozin  10 mg Oral Daily   feeding supplement  237 mL Oral TID BM   furosemide  40 mg Oral Daily   gabapentin  200 mg Oral TID   lidocaine  1 patch Transdermal Q24H   losartan  25 mg Oral Daily   multivitamin with minerals  1 tablet Oral Daily   pantoprazole  40 mg Oral BID AC   predniSONE  20 mg Oral BID WC   Continuous Infusions:  ceFEPime (MAXIPIME) IV 2 g (08/15/22 0826)   PRN Meds:.acetaminophen **OR** acetaminophen, HYDROcodone bit-homatropine, HYDROmorphone (DILAUDID) injection, ondansetron **OR** ondansetron (ZOFRAN) IV  Interim History / Subjective:  Still L ant cp with cough/ non productive, but improving over last sev days of gabapentin though also still getting IV opioids prn   Objective   Blood pressure 118/68, pulse 77, temperature 98.6 F (37 C), temperature source Oral, resp. rate 16, height _0  (1.626 m), weight 65.3 kg, SpO2 97 %.        Intake/Output Summary (Last 24 hours) at 08/15/2022 1342 Last data filed at 08/14/2022 1700 Gross per 24 hour  Intake 240 ml  Output --  Net 240 ml   [employer]   08/10/22  1818  Weight: 65.3 kg    Examination:  Tmax:  99 General appearance:    a bit brighter affect today   At Rest 02 sats  93% on RA  No jvd Oropharynx clear,  mucosa nl Neck supple Lungs with decreased BS bases s rub  RRR no s3 or or sign murmur Abd soft/ nl  excursion  Extr warm with no edema or clubbing noted Neuro  Sensorium intact ,  no apparent motor deficits    I personally reviewed images and agree with radiology impression as follows:  CXR:   rib detail 10/26  Neg   Best Practice (right click and "Reselect all SmartList Selections" daily)  Per [facility]   Labs   CBC: Recent Labs  Lab 08/10/22 1856 08/11/22 0343  08/12/22 0515 08/13/22 0444 08/14/22 0337  WBC 20.0* 19.5* 22.2* 19.6* 15.7*  NEUTROABS 15.9*  --   --   --   --   HGB 12.3 12.2 10.6* 10.6* 10.0*  HCT 37.2 37.5 33.0* 32.7* 30.9*  MCV 84.9 86.2 87.3 87.0 87.3  PLT 450* 398 398 403* 410*    Basic Metabolic Panel: Recent Labs  Lab 08/10/22 1856 08/11/22 0343 08/12/22 0515 08/13/22 0444  NA 136 137 137 138  K 3.7 4.0 4.5 4.0  CL 102 102 103 107  CO2 _0 GLUCOSE 151* 141* 132* 157*  BUN _1 29*  CREATININE 1.20* 0.94 1.31* 1.17*  CALCIUM 9.9 9.4 9.7 9.9  MG  --  2.3  --   --   PHOS  --  4.3  --   --    GFR: Estimated Creatinine Clearance: 38.6 mL/min (A) (by C-G formula based on SCr of 1.17 mg/dL (H)). Recent Labs  Lab 08/10/22 1909 08/10/22 2155 08/11/22 0343 08/12/22 0515 08/13/22 0444 08/14/22 0337  PROCALCITON  --   --  <0.10  --   --   --   WBC  --   --  19.5* 22.2* 19.6* 15.7*  LATICACIDVEN 1.8 1.4  --   --   --   --     Liver Function Tests: Recent Labs  Lab 08/10/22 1856 08/11/22 0343 08/12/22 0515  AST 20 247* 74*  ALT 33 107* 101*  ALKPHOS 90 126 130*  BILITOT 0.5 0.8 0.7  PROT 8.4* 7.0 6.8  ALBUMIN 3.9 3.3* 3.0*   No results for input(s): "LIPASE", "AMYLASE" in the last 168 hours. No results for input(s): "AMMONIA" in the last 168 hours.  ABG    Component Value Date/Time   PHART 7.42 07/30/2022 0319   PCO2ART 35 07/30/2022 0319   PO2ART 100 07/30/2022 0319   HCO3 30.2 (H) 07/31/2022 2129   TCO2 24 07/29/2022 0351   ACIDBASEDEF 1.3 07/30/2022 0319   O2SAT 26.8 07/31/2022 2129     Coagulation Profile: Recent Labs  Lab 08/10/22 1856  INR 1.1    Cardiac Enzymes: No results for input(s): "CKTOTAL", "CKMB", "CKMBINDEX", "TROPONINI" in the last 168 hours.  HbA1C: Hgb A1c MFr Bld  Date/Time Value Ref Range Status  07/29/2022 06:23 AM 6.3 (H) 4.8 - 5.6 % Final    Comment:    (NOTE) Pre diabetes:          5.7%-6.4%  Diabetes:              >6.4%  Glycemic control  for   <7.0% adults with diabetes     CBG: Recent Labs  Lab 08/11/22 0028 08/11/22 0721 08/11/22 1141  08/13/22 1613  GLUCAP 138* 109* 109* 109*       Lab Results  Component Value Date   ESRSEDRATE 110 (H) 08/14/2022   ESRSEDRATE 45 (H) 08/13/2022         Imp/Rec  1)  Acute PE / dvt p recent admit with resp failure/ vent dependent for chf  probably complicated by aspiration/hcap and acute on chronic hypoxemic Resp failure - now on DOAC and tol fine - continue mobilize  >>> IS ordered but not delivered / needs one for d/c if possible   2) L chest wall pain is likely from severe coughing fits/ rib injury and not directly related to small L PE but hard to be sure as also has small effusion with pleuritis likely   >>> increase gabapentin to 300 mg qid and continue  prn opioids but just the po route >>> ESR trending way up c/w pleuritis vs organizing pna > try prednisone 20 mg bid until better then 20 mgdaily x 5 days, 10 mg daily x 5 days and stop  3) Asp vs HCAP both lower lobes   - MRSA PCR screen neg/ other studies pending  >>> rx per [facility]  F/u 2 weeks in my office     [name], MD Pulmonary and [place] [facility] [phone]   After 7:00 pm call Elink  [phone]

## 2022-08-19 ENCOUNTER — Ambulatory Visit (INDEPENDENT_AMBULATORY_CARE_PROVIDER_SITE_OTHER): Payer: 59 | Admitting: Internal Medicine

## 2022-08-19 NOTE — Progress Notes (Signed)
Erroneous encounter - please disregard.

## 2022-08-22 ENCOUNTER — Emergency Department (HOSPITAL_COMMUNITY)
Admission: EM | Admit: 2022-08-22 | Discharge: 2022-08-22 | Disposition: A | Discharge status: Home / Self Care | Payer: Medicare Other | Attending: Emergency Medicine | Admitting: Emergency Medicine

## 2022-08-22 ENCOUNTER — Emergency Department (HOSPITAL_COMMUNITY): Payer: Medicare Other

## 2022-08-22 DIAGNOSIS — Z79899 Other long term (current) drug therapy: Secondary | ICD-10-CM | POA: Diagnosis not present

## 2022-08-22 DIAGNOSIS — I509 Heart failure, unspecified: Secondary | ICD-10-CM | POA: Diagnosis not present

## 2022-08-22 DIAGNOSIS — E876 Hypokalemia: Secondary | ICD-10-CM | POA: Diagnosis not present

## 2022-08-22 DIAGNOSIS — Z7901 Long term (current) use of anticoagulants: Secondary | ICD-10-CM | POA: Insufficient documentation

## 2022-08-22 DIAGNOSIS — D72829 Elevated white blood cell count, unspecified: Secondary | ICD-10-CM | POA: Insufficient documentation

## 2022-08-22 DIAGNOSIS — Z86711 Personal history of pulmonary embolism: Secondary | ICD-10-CM | POA: Insufficient documentation

## 2022-08-22 DIAGNOSIS — R799 Abnormal finding of blood chemistry, unspecified: Secondary | ICD-10-CM | POA: Diagnosis present

## 2022-08-22 DIAGNOSIS — R899 Unspecified abnormal finding in specimens from other organs, systems and tissues: Secondary | ICD-10-CM

## 2022-08-22 LAB — CBC WITH DIFFERENTIAL/PLATELET
Abs Immature Granulocytes: 0.15 10*3/uL — ABNORMAL HIGH (ref 0.00–0.07)
Basophils Absolute: 0 10*3/uL (ref 0.0–0.1)
Basophils Relative: 0 %
Eosinophils Absolute: 0.2 10*3/uL (ref 0.0–0.5)
Eosinophils Relative: 1 %
HCT: 35.8 % — ABNORMAL LOW (ref 36.0–46.0)
Hemoglobin: 11.7 g/dL — ABNORMAL LOW (ref 12.0–15.0)
Immature Granulocytes: 1 %
Lymphocytes Relative: 22 %
Lymphs Abs: 4.3 10*3/uL — ABNORMAL HIGH (ref 0.7–4.0)
MCH: 27.8 pg (ref 26.0–34.0)
MCHC: 32.7 g/dL (ref 30.0–36.0)
MCV: 85 fL (ref 80.0–100.0)
Monocytes Absolute: 1 10*3/uL (ref 0.1–1.0)
Monocytes Relative: 5 %
Neutro Abs: 14.3 10*3/uL — ABNORMAL HIGH (ref 1.7–7.7)
Neutrophils Relative %: 71 %
Platelets: 414 10*3/uL — ABNORMAL HIGH (ref 150–400)
RBC: 4.21 MIL/uL (ref 3.87–5.11)
RDW: 13 % (ref 11.5–15.5)
WBC: 20 10*3/uL — ABNORMAL HIGH (ref 4.0–10.5)
nRBC: 0 % (ref 0.0–0.2)

## 2022-08-22 LAB — URINALYSIS, ROUTINE W REFLEX MICROSCOPIC
Bacteria, UA: NONE SEEN
Bilirubin Urine: NEGATIVE
Glucose, UA: >=500 mg/dL — AB
Hgb urine dipstick: NEGATIVE
Ketones, ur: NEGATIVE mg/dL
Leukocytes,Ua: NEGATIVE
Nitrite: NEGATIVE
Protein, ur: NEGATIVE mg/dL
Specific Gravity, Urine: 1.01 (ref 1.005–1.030)
pH: 6 (ref 5.0–8.0)

## 2022-08-22 LAB — COMPREHENSIVE METABOLIC PANEL
ALT: 76 U/L — ABNORMAL HIGH (ref 0–44)
AST: 35 U/L (ref 15–41)
Albumin: 3.4 g/dL — ABNORMAL LOW (ref 3.5–5.0)
Alkaline Phosphatase: 105 U/L (ref 38–126)
Anion gap: 11 (ref 5–15)
BUN: 29 mg/dL — ABNORMAL HIGH (ref 8–23)
CO2: 29 mmol/L (ref 22–32)
Calcium: 9.7 mg/dL (ref 8.9–10.3)
Chloride: 97 mmol/L — ABNORMAL LOW (ref 98–111)
Creatinine, Ser: 1.08 mg/dL — ABNORMAL HIGH (ref 0.44–1.00)
GFR, Estimated: 55 mL/min — ABNORMAL LOW (ref >60)
Glucose, Bld: 233 mg/dL — ABNORMAL HIGH (ref 70–99)
Potassium: 3.1 mmol/L — ABNORMAL LOW (ref 3.5–5.1)
Sodium: 137 mmol/L (ref 135–145)
Total Bilirubin: 0.7 mg/dL (ref 0.3–1.2)
Total Protein: 7.1 g/dL (ref 6.5–8.1)

## 2022-08-22 LAB — LACTIC ACID, PLASMA
Lactic Acid, Venous: 2.4 mmol/L (ref 0.5–1.9)
Lactic Acid, Venous: 1.4 mmol/L (ref 0.5–1.9)

## 2022-08-22 MED ORDER — SODIUM CHLORIDE 0.9 % IV BOLUS
1000.0000 mL | Freq: Once | INTRAVENOUS | Status: AC
  Administered 2022-08-22: 1000 mL via INTRAVENOUS

## 2022-08-22 NOTE — ED Triage Notes (Addendum)
Pt to ED c/o abnormal labs, reports received call today from PCP to come to ED because blood work that was taken yesterday shows "sepsis" Pt recently discharged from hospital for respiratory failure. Pt voicing no complaints at this time.

## 2022-08-22 NOTE — Discharge Instructions (Addendum)
You were seen today due to a reportedly abnormal lab value.  Your work-up today was reassuring for no signs of sepsis at this time.  You do have an elevated white blood cell count but this may be due to the steroids you have been taking over the past few days.  Since you are asymptomatic at this time it is reasonable to discharge you with no new antibiotics.  If you develop any life-threatening symptoms please return to the emergency department for reevaluation.

## 2022-08-22 NOTE — ED Provider Notes (Signed)
[facility] EMERGENCY DEPARTMENT Provider Note   CSN: [id number] Arrival date & time: 08/22/22  1348     History  Chief Complaint  Patient presents with   abnormal labs    Summer Christensen is a 71 y.o. female.  Patient presents to the emergency department complaining of abnormal labs.  Patient states that she received a phone call today from her primary care provider who recommend she come to the emergency department because her blood work, drawn yesterday, shows "sepsis".  The patient has been admitted twice over the past month to the hospital, initially due to congestive heart failure with community-acquired pneumonia, and secondly due to pulmonary emboli.  The patient currently has no complaints.  She complains of no chest pain, shortness of breath, abdominal pain, nausea, vomiting, urinary symptoms.  HPI     Home Medications Prior to Admission medications   Medication Sig Start Date End Date Taking? Authorizing Provider  apixaban (ELIQUIS) 5 MG TABS tablet 2 po bid thru 10/31 then 1 po BID starting 08/20/22 08/20/22   Johnson, Clanford L, MD  atorvastatin (LIPITOR) 40 MG tablet Take 40 mg by mouth daily. 12/12/21   [provider]  carvedilol (COREG) 3.125 MG tablet Take 1 tablet (3.125 mg total) by mouth 2 (two) times daily with a meal. 08/04/22   Vassie Loll, MD  dicyclomine (BENTYL) 20 MG tablet Take 20 mg by mouth 4 (four) times daily as needed for spasms. 06/24/22   [provider]  empagliflozin (JARDIANCE) 10 MG TABS tablet Take 1 tablet (10 mg total) by mouth daily. 08/05/22   Vassie Loll, MD  furosemide (LASIX) 40 MG tablet Take 1 tablet (40 mg total) by mouth daily. 08/05/22   Vassie Loll, MD  gabapentin (NEURONTIN) 300 MG capsule Take 1 capsule (300 mg total) by mouth 3 (three) times daily for 14 days. 08/15/22 08/29/22  Johnson, Clanford L, MD  losartan (COZAAR) 25 MG tablet Take 1 tablet (25 mg total) by mouth daily. 08/04/22   Vassie Loll, MD   pantoprazole (PROTONIX) 40 MG tablet Take 1 tablet (40 mg total) by mouth daily. 08/05/22   Vassie Loll, MD  predniSONE (DELTASONE) 20 MG tablet Take 1 tablet (20 mg total) by mouth 2 (two) times daily with a meal for 3 days, THEN 1 tablet (20 mg total) daily with breakfast for 4 days. 08/15/22 08/22/22  Johnson, Clanford L, MD  Saccharomyces boulardii (PROBIOTIC) 250 MG CAPS Take 1 capsule by mouth daily. 06/24/22   [provider]  spironolactone (ALDACTONE) 25 MG tablet Take 0.5 tablets (12.5 mg total) by mouth daily. 08/05/22   Vassie Loll, MD      Allergies    Patient has no known allergies.    Review of Systems   Review of Systems  Constitutional:  Negative for fever.  Respiratory:  Negative for shortness of breath.   Cardiovascular:  Negative for chest pain.  Gastrointestinal:  Negative for abdominal pain, nausea and vomiting.  Genitourinary:  Negative for dysuria.  Neurological:  Negative for weakness and light-headedness.    Physical Exam Updated Vital Signs BP 137/67   Pulse 63   Temp 98.3 F (36.8 C) (Oral)   Resp 15   Ht 5\' 4"  (1.626 m)   Wt 61.2 kg   SpO2 100%   BMI 23.17 kg/m  Physical Exam Vitals and nursing note reviewed.  Constitutional:      General: She is not in acute distress.    Appearance: She is well-developed.  HENT:     Head: Normocephalic and atraumatic.  Eyes:     Conjunctiva/sclera: Conjunctivae normal.  Cardiovascular:     Rate and Rhythm: Normal rate and regular rhythm.     Heart sounds: No murmur heard. Pulmonary:     Effort: Pulmonary effort is normal. No respiratory distress.     Breath sounds: Normal breath sounds.  Abdominal:     Palpations: Abdomen is soft.     Tenderness: There is no abdominal tenderness.  Musculoskeletal:        General: No swelling.     Cervical back: Neck supple.  Skin:    General: Skin is warm and dry.     Capillary Refill: Capillary refill takes less than 2 seconds.  Neurological:      Mental Status: She is alert.  Psychiatric:        Mood and Affect: Mood normal.     ED Results / Procedures / Treatments   Labs (all labs ordered are listed, but only abnormal results are displayed) Labs Reviewed  COMPREHENSIVE METABOLIC PANEL - Abnormal; Notable for the following components:      Result Value   Potassium 3.1 (*)    Chloride 97 (*)    Glucose, Bld 233 (*)    BUN 29 (*)    Creatinine, Ser 1.08 (*)    Albumin 3.4 (*)    ALT 76 (*)    GFR, Estimated 55 (*)    All other components within normal limits  LACTIC ACID, PLASMA - Abnormal; Notable for the following components:   Lactic Acid, Venous 2.4 (*)    All other components within normal limits  CBC WITH DIFFERENTIAL/PLATELET - Abnormal; Notable for the following components:   WBC 20.0 (*)    Hemoglobin 11.7 (*)    HCT 35.8 (*)    Platelets 414 (*)    Neutro Abs 14.3 (*)    Lymphs Abs 4.3 (*)    Abs Immature Granulocytes 0.15 (*)    All other components within normal limits  URINALYSIS, ROUTINE W REFLEX MICROSCOPIC - Abnormal; Notable for the following components:   Color, Urine STRAW (*)    Glucose, UA >=500 (*)    All other components within normal limits  LACTIC ACID, PLASMA  LACTIC ACID, PLASMA    EKG None  Radiology DG Chest 2 View  Result Date: 08/22/2022 CLINICAL DATA:  Pneumonia. EXAM: CHEST - 2 VIEW COMPARISON:  August 14, 2022. FINDINGS: Stable cardiomediastinal silhouette. Mild left basilar atelectasis or infiltrate is noted with small left pleural effusion. Minimal right basilar subsegmental atelectasis is noted. Bony thorax is unremarkable. IMPRESSION: Mild left basilar atelectasis or infiltrate is noted with small left pleural effusion. Minimal right basilar subsegmental atelectasis. Electronically Signed   By: Lupita Raider M.D.   On: 08/22/2022 16:22    Procedures Procedures    Medications Ordered in ED Medications  sodium chloride 0.9 % bolus 1,000 mL (0 mLs Intravenous Stopped  08/22/22 1843)    ED Course/ Medical Decision Making/ A&P                           Medical Decision Making Amount and/or Complexity of Data Reviewed Labs: ordered. Radiology: ordered.   The patient is currently asymptomatic.  I attempted to review the patient's past medical history from her recent primary care visit but unfortunately they are not connected in care everywhere and I am unable to review the records.  I  did review the discharge summary from October 26 where she was admitted due to acute PE and right leg DVT.  She was also followed for liver pneumonia and atelectasis.  Discharged on doxycycline with 3 more days of therapy at time of discharge.  I attempted to call the patient's primary care provider but their office closed at noon today and I was unable to make contact  I ordered and reviewed labs.  Pertinent results include white count of 20,000, hemoglobin 11.7, potassium 3.1, UA negative for infection, initial lactic acid 2.4 with repeat at 1.4  I ordered and interpreted imaging including a chest x-ray. Mild left basilar atelectasis or infiltrate is noted with small left  pleural effusion. Minimal right basilar subsegmental atelectasis  I agree with the radiologist findings  I ordered the patient normal saline for rehydration.  Upon reassessment the patient states the same  Unfortunately due to the practice being closed for the day I was unable to find out what lab value sent the patient to the hospital.  I am assuming it was the continued leukocytosis.  The patient has been on a steroid Dosepak for the past few days and I wonder if the leukocytosis was due to the steroids.  She is having no symptoms at this time.  Lungs are clear to auscultation bilaterally.  Lactic acid was initially mildly elevated but decreased with fluid administration.  I see no indication at this time for a new antibiotic or for admission.  The patient is eager to discharge home and I think this is  perfectly reasonable.  The patient will be discharged home at this time.  I do recommend she follow-up with her primary care provider        Final Clinical Impression(s) / ED Diagnoses Final diagnoses:  Abnormal laboratory test result  Hypokalemia  Leukocytosis, unspecified type    Rx / DC Orders ED Discharge Orders     None         Ronny Bacon 08/22/22 1849    Lajean Saver, MD 08/26/22 1620

## 2022-09-03 ENCOUNTER — Institutional Professional Consult (permissible substitution): Payer: 59 | Admitting: Internal Medicine

## 2022-09-04 ENCOUNTER — Ambulatory Visit (HOSPITAL_COMMUNITY)
Admission: RE | Admit: 2022-09-04 | Discharge: 2022-09-04 | Disposition: A | Discharge status: Home / Self Care | Payer: Medicare Other | Source: Ambulatory Visit | Attending: Internal Medicine | Admitting: Internal Medicine

## 2022-09-04 ENCOUNTER — Ambulatory Visit: Payer: Medicare Other | Admitting: Internal Medicine

## 2022-09-04 ENCOUNTER — Encounter: Payer: Self-pay | Admitting: Internal Medicine

## 2022-09-04 VITALS — BP 120/68 | HR 64 | Temp 98.4°F | Ht 64.0 in | Wt 141.6 lb

## 2022-09-04 DIAGNOSIS — J181 Lobar pneumonia, unspecified organism: Secondary | ICD-10-CM

## 2022-09-04 DIAGNOSIS — J9601 Acute respiratory failure with hypoxia: Secondary | ICD-10-CM | POA: Diagnosis not present

## 2022-09-04 DIAGNOSIS — I2699 Other pulmonary embolism without acute cor pulmonale: Secondary | ICD-10-CM

## 2022-09-04 NOTE — Patient Instructions (Addendum)
Ok to Leave off gabapentin and aspirin   Keep appt with cardiology in [place]   Keep appt with primary care   Please remember to go to the  x-ray department  @  [facility] for your tests - we will call you with the results when they are available     Follow up in this clinic is as needed

## 2022-09-04 NOTE — Progress Notes (Signed)
 Summer Christensen, female    DOB: 02/28/1951   MRN: [id number]   Brief patient profile:  70   yobf never smoker referred to pulmonary clinic in [place]  09/04/2022 s/p admit    Eval 08/15/22 at [facility]  1)  Acute PE / R dvt p recent admit with resp failure/ vent dependent for chf  probably complicated by aspiration/hcap and acute on chronic hypoxemic Resp failure - rx  DOAC and tol fine - continue mobilize        2) L chest wall pain is likely from severe coughing fits/ rib injury and not directly related to small L PE but hard to be sure as also has small effusion with pleuritis likely  Lab Results  Component Value Date   ESRSEDRATE 110 (H) 08/14/2022   ESRSEDRATE 45 (H) 08/13/2022  >>> increase gabapentin to 300 mg qid and continue  prn opioids po  >>> ESR trending way up c/w pleuritis vs organizing pna > try prednisone 20 mg bid until better then 20 mg daily x 5 days, 10 mg daily x 5 days and stop   3) Asp vs HCAP both lower lobes   - MRSA PCR screen neg/ other studies pending  >>> rx per [facility]     History of Present Illness  09/04/2022  Pulmonary/ 1st office eval/[place] / [place] post hosp f/u  Chief Complaint  Patient presents with   Hospitalization Follow-up    HFU after CAP 10/10-10/16  Dyspnea:  50 ft to mb slow pace ok s 02  Cough: completely resolved as has the cp no longer on gabapentin or prednisone  Sleep: flat one pillow no resp cc  SABA use: none  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   Chest pain    Hx of hysterectomy    Hypertension    Migraine     Outpatient Medications Prior to Visit  Medication Sig Dispense Refill   apixaban (ELIQUIS) 5 MG TABS tablet 2 po bid thru 10/31 then 1 po BID starting 08/20/22 60 tablet 2   atorvastatin (LIPITOR) 40 MG tablet Take 40 mg by mouth daily.     carvedilol (COREG) 3.125 MG tablet Take 1 tablet (3.125 mg total) by mouth 2 (two) times daily with a meal. 60 tablet 2    dicyclomine (BENTYL) 20 MG tablet Take 20 mg by mouth 4 (four) times daily as needed for spasms.     empagliflozin (JARDIANCE) 10 MG TABS tablet Take 1 tablet (10 mg total) by mouth daily. 30 tablet 2   furosemide (LASIX) 40 MG tablet Take 1 tablet (40 mg total) by mouth daily. 30 tablet 2   losartan (COZAAR) 25 MG tablet Take 1 tablet (25 mg total) by mouth daily. 30 tablet 2   pantoprazole (PROTONIX) 40 MG tablet Take 1 tablet (40 mg total) by mouth daily. 30 tablet 2   Saccharomyces boulardii (PROBIOTIC) 250 MG CAPS Take 1 capsule by mouth daily.     spironolactone (ALDACTONE) 25 MG tablet Take 0.5 tablets (12.5 mg total) by mouth daily. 30 tablet 2   gabapentin (NEURONTIN) 300 MG capsule Take 1 capsule (300 mg total) by mouth 3 (three) times daily for 14 days. 42 capsule 0   No facility-administered medications prior to visit.     Objective:     BP 120/68   Pulse 64   Temp 98.4 F (36.9 C)   Ht 5' 4" (1.626 m)   Wt   141 lb 9.6 oz (64.2 kg)   SpO2 97% Comment: ra  BMI 24.31 kg/m   SpO2: 97 % (ra) pleasant amb bf nad   HEENT : Oropharynx  clear        NECK :  without  apparent JVD/ palpable Nodes/TM    LUNGS: no acc muscle use,  Nl contour chest which is clear to A and P bilaterally without cough on insp or exp maneuvers   CV:  RRR  no s3  2/6 sem  or increase in P2, and no edema   ABD:  soft and nontender with nl inspiratory excursion in the supine position. No bruits or organomegaly appreciated   MS:  Nl gait/ ext warm without deformities Or obvious joint restrictions  calf tenderness, cyanosis or clubbing    SKIN: warm and dry without lesions    NEURO:  alert, approp, nl sensorium with  no motor or cerebellar deficits apparent.   CTa  08/10/22 1. Small left upper lobe segmental pulmonary artery emboli. No CT evidence of right heart straining. 2. Right lower lobe pneumonia. There may be areas of early cavitary change at the right lung base. 3. Small left pleural  effusion with left lung base partial compressive atelectasis or infiltrate. Overall improved aeration of the left lower lobe compared to prior CT. 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  -  Rib detail on L  :   Neg fx  CXR PA and Lateral:   09/04/2022 :    I personally reviewed images and impression is as follows:     Marked serial improvement in aeration both bases vs priors     Assessment   Pulmonary embolism ([place]) Dx 08/10/22 LUL s/p admit with chf / assoc R DVT  - Echo 08/11/22  c/w ef 25% and only mild elevation in PAS with nl RA pressures   Rec 6 m eliquis then recheck venous dopplers and if neg consider d/c eliquis if ok with cardiology   Acute respiratory failure with hypoxia ([place]) Assoc with chf/ pna / PE - see admit 07/2022  - 09/04/2022   Walked on RA  x  3  lap(s) =  approx 450  ft  @ fast pace, stopped due to end of study  with lowest 02 sats 97% and no SOB    Marked clinical improvement so at this point  does not need sitting or amb 02 and ok to d/c it though ideally would like ono on RA first > ordered   Lobar pneumonia ([place]) Likely aspiration related to acute resp failure from chf - see admits x 2 07/2022  - marked serial improvement on cxr/ no desats 09/04/2022 > no f/u needed if no   desats on ONO RA   May have had an element of organizing pna (BOOP like) assoc with high ESR but no evidence of rebound off pred and pulmonary clinic f/u can be prn at this point   Each maintenance medication was reviewed in detail including emphasizing most importantly the difference between maintenance and prns and under what circumstances the prns are to be triggered using an action plan format where appropriate.  Total time for H and P, chart review, counseling,  directly observing portions of ambulatory 02 saturation study/ and generating customized AVS unique to this office visit / same day charting  = 46 min post hosp f/u and transition of care.  [name], MD 09/04/2022

## 2022-09-05 ENCOUNTER — Ambulatory Visit: Payer: 59 | Admitting: Internal Medicine

## 2022-09-06 ENCOUNTER — Encounter: Payer: Self-pay | Admitting: Internal Medicine

## 2022-09-06 ENCOUNTER — Telehealth: Payer: Self-pay | Admitting: Internal Medicine

## 2022-09-06 DIAGNOSIS — J9601 Acute respiratory failure with hypoxia: Secondary | ICD-10-CM

## 2022-09-06 NOTE — Assessment & Plan Note (Signed)
Assoc with chf/ pna / PE - see admit 07/2022  - 09/04/2022   Walked on RA  x  3  lap(s) =  approx 450  ft  @ fast pace, stopped due to end of study  with lowest 02 sats 97% and no SOB    Marked clinical improvement so at this point  does not need sitting or amb 02 and ok to d/c it though ideally would like ono on RA first > ordered

## 2022-09-06 NOTE — Assessment & Plan Note (Signed)
Dx 08/10/22 LUL s/p admit with chf / assoc R DVT  - Echo 08/11/22  c/w ef 25% and only mild elevation in PAS with nl RA pressures   Rec 6 m eliquis then recheck venous dopplers and if neg consider d/c eliquis if ok with cardiology

## 2022-09-06 NOTE — Assessment & Plan Note (Signed)
Likely aspiration related to acute resp failure from chf - see admits x 2 07/2022  - marked serial improvement on cxr/ no desats 09/04/2022 > no f/u needed if no   desats on ONO RA   May have had an element of organizing pna (BOOP like) assoc with high ESR but no evidence of rebound off pred and pulmonary clinic f/u can be prn at this point   Each maintenance medication was reviewed in detail including emphasizing most importantly the difference between maintenance and prns and under what circumstances the prns are to be triggered using an action plan format where appropriate.  Total time for H and P, chart review, counseling,  directly observing portions of ambulatory 02 saturation study/ and generating customized AVS unique to this office visit / same day charting  = 46 min post hosp f/u and transition of care.

## 2022-09-06 NOTE — Telephone Encounter (Signed)
Needs ono RA so we can safely d/c 02 completely

## 2022-09-08 ENCOUNTER — Encounter: Payer: Self-pay | Admitting: Internal Medicine

## 2022-09-08 ENCOUNTER — Ambulatory Visit: Payer: 59 | Attending: Internal Medicine | Admitting: Internal Medicine

## 2022-09-08 ENCOUNTER — Encounter: Payer: Self-pay | Admitting: *Deleted

## 2022-09-08 VITALS — BP 136/70 | HR 84 | Ht 64.0 in | Wt 142.0 lb

## 2022-09-08 DIAGNOSIS — I824Z1 Acute embolism and thrombosis of unspecified deep veins of right distal lower extremity: Secondary | ICD-10-CM

## 2022-09-08 DIAGNOSIS — I5022 Chronic systolic (congestive) heart failure: Secondary | ICD-10-CM | POA: Diagnosis not present

## 2022-09-08 MED ORDER — LOSARTAN POTASSIUM 50 MG PO TABS: 50.0000 mg | ORAL_TABLET | Freq: Every day | ORAL | 1 refills | Status: DC

## 2022-09-08 MED ORDER — CARVEDILOL 6.25 MG PO TABS: 6.2500 mg | ORAL_TABLET | Freq: Two times a day (BID) | ORAL | 1 refills | Status: DC

## 2022-09-08 NOTE — Progress Notes (Signed)
This encounter was created in error - please disregard.

## 2022-09-08 NOTE — Telephone Encounter (Signed)
ATC patient to notify of ONO order and to ask what DME she has. LVM letting her know of ONO order.

## 2022-09-08 NOTE — Patient Instructions (Addendum)
Medication Instructions:  Your physician has recommended you make the following change in your medication:  Stop lisinopril Increase losartan to 50 mg daily Increase carvedilol to 6.25 mg twice daily Continue other medications the same  Labwork: none  Testing/Procedures: Coronary CTA  Follow-Up: Your physician recommends that you schedule a follow-up appointment in: 2 weeks  Any Other Special Instructions Will Be Listed Below (If Applicable).  If you need a refill on your cardiac medications before your next appointment, please call your pharmacy.    Your cardiac CT will be scheduled at one of the below locations:   [facility] [address] [place], [place] [address] [phone]  If scheduled at [facility], please arrive at the [facility] and Children's Entrance (Entrance C2) of [facility] 30 minutes prior to test start time. You can use the FREE valet parking offered at entrance C (encouraged to control the heart rate for the test)  Proceed to the [facility] Radiology Department (first floor) to check-in and test prep.  All radiology patients and guests should use entrance C2 at [facility], accessed from [facility], even though the hospital's physical address listed is [address].      Please follow these instructions carefully (unless otherwise directed):   On the Night Before the Test: Be sure to Drink plenty of water. Do not consume any caffeinated/decaffeinated beverages or chocolate 12 hours prior to your test. Do not take any antihistamines 12 hours prior to your test. If the patient has contrast allergy:-No contrast allergy  On the Day of the Test: Drink plenty of water until 1 hour prior to the test. Do not eat any food 1 hour prior to test. You may take your regular medications prior to the test except jardiance, furosemide and spironolactone  FEMALES- please wear underwire-free bra if  available, avoid dresses & tight clothing       After the Test: Drink plenty of water. After receiving IV contrast, you may experience a mild flushed feeling. This is normal. On occasion, you may experience a mild rash up to 24 hours after the test. This is not dangerous. If this occurs, you can take Benadryl 25 mg and increase your fluid intake. If you experience trouble breathing, this can be serious. If it is severe call 911 IMMEDIATELY. If it is mild, please call our office.  We will call to schedule your test 2-4 weeks out understanding that some insurance companies will need an authorization prior to the service being performed.   For non-scheduling related questions, please contact the cardiac imaging nurse navigator should you have any questions/concerns: Rockwell Alexandria, Cardiac Imaging Nurse Navigator Larey Brick, Cardiac Imaging Nurse Navigator [facility] Direct Office Dial: [phone]   For scheduling needs, including cancellations and rescheduling, please call [place], [phone].

## 2022-09-08 NOTE — Progress Notes (Unsigned)
Cardiology Office Note  Date: 09/08/2022   ID: Tiarah, Shisler 12/23/1950, MRN [id number]  PCP:  Smith Robert, MD  Cardiologist:  Marjo Bicker, MD Electrophysiologist:  None   Reason for Office Visit: No chief complaint on file.   History of Present Illness: Summer Christensen is a 71 y.o. 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   ROTATOR CUFF REPAIR  2009   arthroscopic   TUBAL LIGATION     bilateral    Current Outpatient Medications  Medication Sig Dispense Refill   apixaban (ELIQUIS) 5 MG TABS tablet 2 po bid thru 10/31 then 1 po BID starting 08/20/22 60 tablet 2   atorvastatin (LIPITOR) 40 MG tablet Take 40 mg by mouth daily.     carvedilol (COREG) 6.25 MG tablet Take 1 tablet (6.25 mg total) by mouth 2 (two) times daily. 60 tablet 1   dicyclomine (BENTYL) 20 MG tablet Take 20 mg by mouth 4 (four) times daily as needed for spasms.     empagliflozin (JARDIANCE) 10 MG TABS tablet Take 1 tablet (10 mg total) by mouth daily. 30 tablet 2   furosemide (LASIX) 40 MG tablet Take 1 tablet (40 mg total) by mouth daily. 30 tablet 2   losartan (COZAAR) 50 MG tablet Take 1 tablet (50 mg total) by mouth daily. 30 tablet 1   pantoprazole (PROTONIX) 40 MG tablet Take 1 tablet (40 mg total) by mouth daily. 30 tablet 2   Saccharomyces boulardii (PROBIOTIC) 250 MG CAPS Take 1 capsule by mouth daily.     spironolactone (ALDACTONE) 25 MG tablet Take 0.5 tablets (12.5 mg total) by mouth daily. 30 tablet 2   No current facility-administered medications for  this visit.   Allergies:  Patient has no known allergies.   Social History: The patient  reports that she has never smoked. She has never used smokeless tobacco. She reports that she does not drink alcohol and does not use drugs.   Family History: The patient's family history includes Heart attack in her father; Stroke in her mother.   ROS:  Please see the history of present illness. Otherwise, complete review of systems is positive for {NONE DEFAULTED:18576}.  All other systems are reviewed and negative.   Physical Exam: VS:  BP 136/70   Pulse 84   Ht 5\' 4"  (1.626 m)   Wt 142 lb (64.4 kg)   SpO2 100%   BMI 24.37 kg/m , BMI Body mass index is 24.37 kg/m.  Wt Readings from Last 3 Encounters:  09/08/22 142 lb (64.4 kg)  09/04/22 141 lb 9.6 oz (64.2 kg)  08/22/22 135 lb (61.2 kg)    General: Patient appears comfortable at rest. HEENT: Conjunctiva and lids normal, oropharynx clear with moist mucosa. Neck: Supple, no elevated JVP or carotid bruits, no thyromegaly. Lungs: Clear to auscultation, nonlabored breathing at rest. Cardiac: Regular rate and rhythm, no S3 or significant systolic murmur, no pericardial rub. Abdomen: Soft, nontender, no  hepatomegaly, bowel sounds present, no guarding or rebound. Extremities: No pitting edema, distal pulses 2+. Skin: Warm and dry. Musculoskeletal: No kyphosis. Neuropsychiatric: Alert and oriented x3, affect grossly appropriate.  ECG:  {EKG/Telemetry Strips Reviewed:[phone]}  Recent Labwork: 08/10/2022: B Natriuretic Peptide 264.0 08/11/2022: Magnesium 2.3 08/22/2022: ALT 76; AST 35; BUN 29; Creatinine, Ser 1.08; Hemoglobin 11.7; Platelets 414; Potassium 3.1; Sodium 137     Component Value Date/Time   TRIG 123 08/02/2022 0352    Other Studies Reviewed Today:   Assessment and Plan:    Medication Adjustments/Labs and Tests Ordered: Current medicines are reviewed at length with the patient today.  Concerns regarding medicines are  outlined above.   Tests Ordered: Orders Placed This Encounter  Procedures   CT CORONARY MORPH W/CTA COR W/SCORE W/CA W/CM &/OR WO/CM   EKG 12-Lead    Medication Changes: Meds ordered this encounter  Medications   carvedilol (COREG) 6.25 MG tablet    Sig: Take 1 tablet (6.25 mg total) by mouth 2 (two) times daily.    Dispense:  60 tablet    Refill:  1    09/08/2022 dose increase   losartan (COZAAR) 50 MG tablet    Sig: Take 1 tablet (50 mg total) by mouth daily.    Dispense:  30 tablet    Refill:  1    09/08/2022 dose increase    Disposition:  Follow up {follow up:15908}  Signed Chieko Neises Verne Spurr, MD, 09/08/2022 4:00 PM    [facility] at [facility] [address] [place], [place], [place] [address]

## 2022-09-09 DIAGNOSIS — I82409 Acute embolism and thrombosis of unspecified deep veins of unspecified lower extremity: Secondary | ICD-10-CM | POA: Insufficient documentation

## 2022-09-09 NOTE — Telephone Encounter (Signed)
Called and spoke with patient and she voiced understanding of test being ordered. Confirmed that DME is adapt. Order placed. Nothing further needed

## 2022-09-22 ENCOUNTER — Telehealth (HOSPITAL_COMMUNITY): Payer: Self-pay | Admitting: Emergency Medicine

## 2022-09-22 NOTE — Telephone Encounter (Signed)
Reaching out to patient to offer assistance regarding upcoming cardiac imaging study; pt verbalizes understanding of appt date/time, parking situation and where to check in, pre-test NPO status and medications ordered, and verified current allergies; name and call back number provided for further questions should they arise Rockwell Alexandria RN Navigator Cardiac Imaging [facility] [phone] office [phone] cell  Arrival 1230 Holding diuretics Coreg increased during last office visit

## 2022-09-23 ENCOUNTER — Ambulatory Visit (HOSPITAL_COMMUNITY)
Admission: RE | Admit: 2022-09-23 | Discharge: 2022-09-23 | Disposition: A | Discharge status: Home / Self Care | Payer: Medicare Other | Source: Ambulatory Visit | Attending: Internal Medicine | Admitting: Internal Medicine

## 2022-09-23 DIAGNOSIS — I700 Atherosclerosis of aorta: Secondary | ICD-10-CM

## 2022-09-23 DIAGNOSIS — I5022 Chronic systolic (congestive) heart failure: Secondary | ICD-10-CM | POA: Insufficient documentation

## 2022-09-23 MED ORDER — IOHEXOL 350 MG/ML SOLN
100.0000 mL | Freq: Once | INTRAVENOUS | Status: AC | PRN
  Administered 2022-09-23: 100 mL via INTRAVENOUS

## 2022-09-23 MED ORDER — NITROGLYCERIN 0.4 MG SL SUBL
0.8000 mg | SUBLINGUAL_TABLET | Freq: Once | SUBLINGUAL | Status: AC
  Administered 2022-09-23: 0.8 mg via SUBLINGUAL

## 2022-09-23 MED ORDER — NITROGLYCERIN 0.4 MG SL SUBL
SUBLINGUAL_TABLET | SUBLINGUAL | Status: AC
  Filled 2022-09-23: qty 2

## 2022-09-29 ENCOUNTER — Ambulatory Visit: Payer: Medicare Other | Attending: Internal Medicine | Admitting: Internal Medicine

## 2022-09-29 ENCOUNTER — Encounter: Payer: Self-pay | Admitting: Internal Medicine

## 2022-09-29 VITALS — BP 134/76 | HR 78 | Ht 64.0 in | Wt 142.0 lb

## 2022-09-29 DIAGNOSIS — I5022 Chronic systolic (congestive) heart failure: Secondary | ICD-10-CM

## 2022-09-29 DIAGNOSIS — E7849 Other hyperlipidemia: Secondary | ICD-10-CM | POA: Insufficient documentation

## 2022-09-29 DIAGNOSIS — I428 Other cardiomyopathies: Secondary | ICD-10-CM | POA: Insufficient documentation

## 2022-09-29 DIAGNOSIS — R160 Hepatomegaly, not elsewhere classified: Secondary | ICD-10-CM | POA: Insufficient documentation

## 2022-09-29 DIAGNOSIS — E785 Hyperlipidemia, unspecified: Secondary | ICD-10-CM | POA: Insufficient documentation

## 2022-09-29 MED ORDER — CARVEDILOL 12.5 MG PO TABS: 12.5000 mg | ORAL_TABLET | Freq: Two times a day (BID) | ORAL | 1 refills | Status: DC

## 2022-09-29 MED ORDER — SPIRONOLACTONE 25 MG PO TABS: 25.0000 mg | ORAL_TABLET | Freq: Every day | ORAL | 1 refills | Status: DC

## 2022-09-29 NOTE — Progress Notes (Signed)
Cardiology Office Note  Date: 09/29/2022   ID: Summer, Christensen Jun 30, 1951, MRN [id number]  PCP:  Smith Robert, MD  Cardiologist:  Marjo Bicker, MD Electrophysiologist:  None   Reason for Office Visit: Management of cardiomyopathy   History of Present Illness: Summer Christensen is a 71 y.o. female known to have HTN, new onset cardiomyopathy (diagnosed in 07/2022) was referred to cardiology clinic for evaluation of cardiomyopathy.  Patient was admitted to [facility] in 07/2022 with severe sepsis secondary to pneumonia and new onset cardiomyopathy with LVEF less than 20%. She was then discharged on antibiotics and outpatient ischemic evaluation was warranted.  She was subsequently admitted again in 2 weeks and was diagnosed with LUL pulmonary embolus and acute DVT of her right leg for which she is currently on Eliquis. Patient is here for a follow-up visit.  She underwent a CTA cardiac that showed coronary calcium score of 53.8 and mild calcified plaque in the ostial LAD. She reported having fatigue since hospital discharge but otherwise denied any angina, DOE, lightheadedness, dizziness, LE swelling.  She is compliant with her medications and has no side effects. She denies smoking cigarettes, illicit drug abuse or alcohol use.  She reports higher co-pay with Eliquis and Jardiance and but wants to wait till January 2024 because she is switching her insurance to [facility] and wants to make sure her medications are covered.  If she continues to have higher co-pay for Eliquis and Jardiance with [facility] in January, she is instructed to call the clinic and request for switching the medications.  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   ROTATOR CUFF REPAIR  2009   arthroscopic   TUBAL LIGATION     bilateral    Current Outpatient Medications  Medication Sig Dispense Refill   apixaban (ELIQUIS) 5 MG TABS tablet 2 po bid thru 10/31 then 1 po BID starting 08/20/22 60 tablet 2   atorvastatin (LIPITOR) 40 MG tablet Take 40 mg by mouth daily.     carvedilol (COREG) 6.25 MG tablet Take 1 tablet (6.25 mg total) by mouth 2 (two) times daily. 60 tablet 1   dicyclomine (BENTYL) 20 MG tablet Take 20 mg by mouth 4 (four) times daily as needed for spasms.     empagliflozin (JARDIANCE) 10 MG TABS tablet Take 1 tablet (10 mg total) by mouth daily. 30 tablet 2   furosemide (LASIX) 40 MG tablet Take 1 tablet (40 mg total) by mouth daily. 30 tablet 2   losartan (COZAAR) 50 MG tablet Take 1 tablet (50 mg total) by mouth daily. 30 tablet 1   pantoprazole (PROTONIX) 40 MG tablet Take 1 tablet (40 mg total) by mouth daily. 30 tablet 2   Saccharomyces boulardii (PROBIOTIC) 250 MG CAPS Take 1 capsule by mouth daily.     spironolactone (ALDACTONE) 25 MG tablet Take 0.5 tablets (12.5 mg total) by mouth daily. 30 tablet 2   No current facility-administered medications for this  visit.   Allergies:  Patient has no known allergies.   Social History: The patient  reports that she has never smoked. She has never used smokeless tobacco. She reports that she does not drink alcohol and does not use drugs.   Family History: The patient's family history includes Heart attack in her father; Stroke in her mother.   ROS:  Please see the history of present illness. Otherwise, complete review of systems is positive for none.  All other systems are reviewed and negative.   Physical Exam: VS:  There were no vitals taken for this visit., BMI There is no height or weight on file to calculate  BMI.  Wt Readings from Last 3 Encounters:  09/08/22 142 lb (64.4 kg)  09/04/22 141 lb 9.6 oz (64.2 kg)  08/22/22 135 lb (61.2 kg)    General: Patient appears comfortable at rest. HEENT: Conjunctiva and lids normal, oropharynx clear with moist mucosa. Neck: Supple, no elevated JVP or carotid bruits, no thyromegaly. Lungs: Clear to auscultation, nonlabored breathing at rest. Cardiac: Regular rate and rhythm, no S3 or significant systolic murmur, no pericardial rub. Abdomen: Soft, nontender, no hepatomegaly, bowel sounds present, no guarding or rebound. Extremities: No pitting edema, distal pulses 2+. Skin: Warm and dry. Musculoskeletal: No kyphosis. Neuropsychiatric: Alert and oriented x3, affect grossly appropriate.  ECG: Normal sinus rhythm and left bundle branch block  Recent Labwork: 08/10/2022: B Natriuretic Peptide 264.0 08/11/2022: Magnesium 2.3 08/22/2022: ALT 76; AST 35; BUN 29; Creatinine, Ser 1.08; Hemoglobin 11.7; Platelets 414; Potassium 3.1; Sodium 137     Component Value Date/Time   TRIG 123 08/02/2022 0352    Other Studies Reviewed Today: Echo from 07/2022 LVEF 20 to 25% with anteroseptal akinesis RV systolic function is normal and RV size normal Mildly elevated pulmonary artery systolic pressures  Assessment and Plan: Patient is a 71 year old F known to have HTN, new onset cardiomyopathy (diagnosed in 07/2022), PE/DVT diagnosed in 7/23 on Eliquis was referred to cardiology clinic for evaluation of cardiomyopathy at the request of Dr. Bryna Colander.  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  If she continues to have higher co-pay, she always can switch to Coumadin. She voiced understanding.  # HLD, unknown value -Continue atorvastatin 40 mg nightly.   # Liver mass on CT cardiac from 10/08/2022 -Suspected mass lesion in the right lobe of liver 2.7 into 2.8 cm concerning for tumor.  MR imaging with and without contrast was recommended.  Instructed patient to follow-up with her PCP for MRI imaging.  She voiced understanding.  I have spent a total of 33 minutes with patient reviewing chart, EKGs, labs and examining patient as well as establishing an assessment and plan that was discussed with the patient.  > 50% of time was spent in direct patient care.      Medication Adjustments/Labs and Tests Ordered: Current medicines are reviewed at length with the patient today.  Concerns regarding medicines are outlined above.   Tests Ordered: No  orders of the defined types were placed in this encounter.   Medication Changes: No orders of the defined types were placed in this encounter.   Disposition:  Follow up  1 month  Signed Neco Kling Verne Spurr, MD, 09/29/2022 1:20 PM    [facility] at [facility] [address] [place], [place], [place] [address]

## 2022-09-29 NOTE — Patient Instructions (Addendum)
Medication Instructions:  Your physician has recommended you make the following change in your medication:  Increase carvedilol to 12.5 mg twice daily Increase spironolactone to 25 mg daily Continue other medications the same  Labwork: none  Testing/Procedures: none  Follow-Up: Your physician recommends that you schedule a follow-up appointment in: 1 month  Any Other Special Instructions Will Be Listed Below (If Applicable). You have been referred to Cardiac Rehab at [facility].   If you need a refill on your cardiac medications before your next appointment, please call your pharmacy.

## 2022-10-07 ENCOUNTER — Encounter (HOSPITAL_COMMUNITY): Payer: Self-pay

## 2022-10-07 ENCOUNTER — Encounter (HOSPITAL_COMMUNITY)
Admission: RE | Admit: 2022-10-07 | Discharge: 2022-10-07 | Disposition: A | Discharge status: Home / Self Care | Payer: Medicare Other | Source: Ambulatory Visit | Attending: Internal Medicine | Admitting: Internal Medicine

## 2022-10-07 VITALS — BP 118/68 | HR 73 | Ht 64.0 in | Wt 144.2 lb

## 2022-10-07 DIAGNOSIS — I5022 Chronic systolic (congestive) heart failure: Secondary | ICD-10-CM | POA: Insufficient documentation

## 2022-10-07 LAB — GLUCOSE, CAPILLARY: Glucose-Capillary: 142 mg/dL — ABNORMAL HIGH (ref 70–99)

## 2022-10-07 NOTE — Progress Notes (Signed)
Cardiac Individual Treatment Plan  Patient Details  Name: Summer Christensen MRN: [id number] Date of Birth: [birth year] Referring Provider:   Flowsheet Row CARDIAC REHAB PHASE II ORIENTATION from 10/07/2022 in [facility] CARDIAC REHABILITATION  Referring Provider Dr. Jenene Slicker       Initial Encounter Date:  Flowsheet Row CARDIAC REHAB PHASE II ORIENTATION from 10/07/2022 in [place] CARDIAC REHABILITATION  Date 10/07/22       Visit Diagnosis: Heart failure, chronic systolic (HCC)  Patient's Home Medications on Admission:  Current Outpatient Medications:    apixaban (ELIQUIS) 5 MG TABS tablet, 2 po bid thru 10/31 then 1 po BID starting 08/20/22, Disp: 60 tablet, Rfl: 2   aspirin EC 81 MG tablet, Take 81 mg by mouth daily. Swallow whole., Disp: , Rfl:    carvedilol (COREG) 12.5 MG tablet, Take 1 tablet (12.5 mg total) by mouth 2 (two) times daily., Disp: 180 tablet, Rfl: 1   empagliflozin (JARDIANCE) 10 MG TABS tablet, Take 1 tablet (10 mg total) by mouth daily., Disp: 30 tablet, Rfl: 2   furosemide (LASIX) 40 MG tablet, Take 1 tablet (40 mg total) by mouth daily., Disp: 30 tablet, Rfl: 2   losartan (COZAAR) 50 MG tablet, Take 1 tablet (50 mg total) by mouth daily., Disp: 30 tablet, Rfl: 1   pantoprazole (PROTONIX) 40 MG tablet, Take 1 tablet (40 mg total) by mouth daily., Disp: 30 tablet, Rfl: 2   Saccharomyces boulardii (PROBIOTIC) 250 MG CAPS, Take 1 capsule by mouth daily., Disp: , Rfl:    spironolactone (ALDACTONE) 25 MG tablet, Take 1 tablet (25 mg total) by mouth daily., Disp: 90 tablet, Rfl: 1  Past Medical History: 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   Chest pain    Hx of hysterectomy    Hypertension    Migraine     Tobacco Use: Social History   Tobacco Use  Smoking Status Never  Smokeless Tobacco Never    Labs: Review Flowsheet       Latest Ref Rng & Units 07/29/2022 07/30/2022 07/31/2022 08/02/2022  Labs for ITP  Cardiac and Pulmonary Rehab  Trlycerides <150 mg/dL - 536  - 644   Hemoglobin A1c 4.8 - 5.6 % 6.3  - - -  PH, Arterial 7.35 - 7.45 7.24  7.42  - -  PCO2 arterial 32 - 48 mmHg 53  35  - -  Bicarbonate 20.0 - 28.0 mmol/L 22.7  22.1  22.7  30.2  -  TCO2 22 - 32 mmol/L 24  - - -  Acid-base deficit 0.0 - 2.0 mmol/L 5.2  9.0  1.3  - -  O2 Saturation % 98.2  33.1  99  26.8  -    Capillary Blood Glucose: Lab Results  Component Value Date   GLUCAP 142 (H) 10/07/2022   GLUCAP 109 (H) 08/13/2022   GLUCAP 109 (H) 08/11/2022   GLUCAP 109 (H) 08/11/2022   GLUCAP 138 (H) 08/11/2022    POCT Glucose     Row Name 10/07/22 0831             POCT Blood Glucose   Pre-Exercise 142 mg/dL                Exercise Target Goals: Exercise Program Goal: Individual exercise prescription set using results from initial 6 min walk test and THRR while considering  patient's activity barriers and safety.   Exercise Prescription Goal: Starting with aerobic activity 30 plus minutes  a day, 3 days per week for initial exercise prescription. Provide home exercise prescription and guidelines that participant acknowledges understanding prior to discharge.  Activity Barriers & Risk Stratification:  Activity Barriers & Cardiac Risk Stratification - 10/07/22 0855       Activity Barriers & Cardiac Risk Stratification   Activity Barriers Back Problems;Joint Problems;Decreased Ventricular Function    Cardiac Risk Stratification High             6 Minute Walk:  6 Minute Walk     Row Name 10/07/22 0857         6 Minute Walk   Phase Initial     Distance 1400 feet     Walk Time 6 minutes     # of Rest Breaks 0     MPH 2.65     METS 2.99     RPE 11     VO2 Peak 10.48     Symptoms No     Resting HR 63 bpm     Resting BP 118/68     Resting Oxygen Saturation  97 %     Exercise Oxygen Saturation  during 6 min walk 97 %     Max Ex. HR 85 bpm     Max Ex. BP 136/68     2 Minute Post BP 122/68               Oxygen Initial Assessment:   Oxygen Re-Evaluation:   Oxygen Discharge (Final Oxygen Re-Evaluation):   Initial Exercise Prescription:  Initial Exercise Prescription - 10/07/22 0800       Date of Initial Exercise RX and Referring Provider   Date 10/07/22    Referring Provider Dr. Jenene Slicker    Expected Discharge Date 11/24/22      Treadmill   MPH 1.7    Grade 0    Minutes 17      Recumbant Elliptical   Level 1    RPM 60    Minutes 22      Prescription Details   Frequency (times per week) 3    Duration Progress to 30 minutes of continuous aerobic without signs/symptoms of physical distress      Intensity   THRR 40-80% of Max Heartrate 60-119    Ratings of Perceived Exertion 11-13      Resistance Training   Training Prescription Yes    Weight 3    Reps 10-15             Perform Capillary Blood Glucose checks as needed.  Exercise Prescription Changes:   Exercise Comments:   Exercise Goals and Review:   Exercise Goals     Row Name 10/07/22 0902             Exercise Goals   Increase Physical Activity Yes       Intervention Provide advice, education, support and counseling about physical activity/exercise needs.;Develop an individualized exercise prescription for aerobic and resistive training based on initial evaluation findings, risk stratification, comorbidities and participant's personal goals.       Expected Outcomes Short Term: Attend rehab on a regular basis to increase amount of physical activity.;Long Term: Add in home exercise to make exercise part of routine and to increase amount of physical activity.;Long Term: Exercising regularly at least 3-5 days a week.       Increase Strength and Stamina Yes       Intervention Provide advice, education, support and counseling about physical activity/exercise needs.;Develop an individualized exercise prescription  for aerobic and resistive training based on initial evaluation findings,  risk stratification, comorbidities and participant's personal goals.       Expected Outcomes Short Term: Increase workloads from initial exercise prescription for resistance, speed, and METs.;Short Term: Perform resistance training exercises routinely during rehab and add in resistance training at home;Long Term: Improve cardiorespiratory fitness, muscular endurance and strength as measured by increased METs and functional capacity ( )       Able to understand and use rate of perceived exertion (RPE) scale Yes       Intervention Provide education and explanation on how to use RPE scale       Expected Outcomes Short Term: Able to use RPE daily in rehab to express subjective intensity level;Long Term:  Able to use RPE to guide intensity level when exercising independently       Knowledge and understanding of Target Heart Rate Range (THRR) Yes       Intervention Provide education and explanation of THRR including how the numbers were predicted and where they are located for reference       Expected Outcomes Short Term: Able to use daily as guideline for intensity in rehab;Short Term: Able to state/look up THRR;Long Term: Able to use THRR to govern intensity when exercising independently       Able to check pulse independently Yes       Intervention Provide education and demonstration on how to check pulse in carotid and radial arteries.;Review the importance of being able to check your own pulse for safety during independent exercise       Expected Outcomes Long Term: Able to check pulse independently and accurately;Short Term: Able to explain why pulse checking is important during independent exercise       Understanding of Exercise Prescription Yes       Intervention Provide education, explanation, and written materials on patient's individual exercise prescription       Expected Outcomes Short Term: Able to explain program exercise prescription;Long Term: Able to explain home exercise prescription to  exercise independently                Exercise Goals Re-Evaluation :    Discharge Exercise Prescription (Final Exercise Prescription Changes):   Nutrition:  Target Goals: Understanding of nutrition guidelines, daily intake of sodium 1500mg , cholesterol 200mg , calories 30% from fat and 7% or less from saturated fats, daily to have 5 or more servings of fruits and vegetables.  Biometrics:  Pre Biometrics - 10/07/22 0903       Pre Biometrics   Height  (1.626 m)    Weight 144 lb 2.9 oz (65.4 kg)    Waist Circumference 34 inches    Hip Circumference 38 inches    Waist to Hip Ratio 0.89 %    BMI (Calculated) 24.74    Triceps Skinfold 25 mm    % Body Fat 36.8 %    Grip Strength 19.4 kg    Flexibility 13 in    Single Leg Stand 19.85 seconds              Nutrition Therapy Plan and Nutrition Goals:  Nutrition Therapy & Goals - 10/07/22 0858       Intervention Plan   Intervention Nutrition handout(s) given to patient.    Expected Outcomes Short Term Goal: Understand basic principles of dietary content, such as calories, fat, sodium, cholesterol and nutrients.             Nutrition Assessments:  Nutrition  Assessments - 10/07/22 0858       MEDFICTS Scores   Pre Score 21            MEDIFICTS Score Key: ?70 Need to make dietary changes  40-70 Heart Healthy Diet ? 40 Therapeutic Level Cholesterol Diet   Picture Your Plate Scores: <76 Unhealthy dietary pattern with much room for improvement. 41-50 Dietary pattern unlikely to meet recommendations for good health and room for improvement. 51-60 More healthful dietary pattern, with some room for improvement.  >60 Healthy dietary pattern, although there may be some specific behaviors that could be improved.    Nutrition Goals Re-Evaluation:   Nutrition Goals Discharge (Final Nutrition Goals Re-Evaluation):   Psychosocial: Target Goals: Acknowledge presence or absence of significant depression  and/or stress, maximize coping skills, provide positive support system. Participant is able to verbalize types and ability to use techniques and skills needed for reducing stress and depression.  Initial Review & Psychosocial Screening:  Initial Psych Review & Screening - 10/07/22 0857       Initial Review   Current issues with Current Sleep Concerns      Family Dynamics   Good Support System? Yes    Comments Her support system includes her three children.      Barriers   Psychosocial barriers to participate in program There are no identifiable barriers or psychosocial needs.      Screening Interventions   Interventions Encouraged to exercise    Expected Outcomes Long Term goal: The participant improves quality of Life and PHQ9 Scores as seen by post scores and/or verbalization of changes;Short Term goal: Identification and review with participant of any Quality of Life or Depression concerns found by scoring the questionnaire.             Quality of Life Scores:  Quality of Life - 10/07/22 0940       Quality of Life   Select Quality of Life      Quality of Life Scores   Health/Function Pre 30 %    Socioeconomic Pre 28.71 %    Psych/Spiritual Pre 30 %    Family Pre 30 %    GLOBAL Pre 29.72 %            Scores of 19 and below usually indicate a poorer quality of life in these areas.  A difference of  2-3 points is a clinically meaningful difference.  A difference of 2-3 points in the total score of the Quality of Life Index has been associated with significant improvement in overall quality of life, self-image, physical symptoms, and general health in studies assessing change in quality of life.  PHQ-9: Review Flowsheet       10/07/2022 11/20/2017  Depression screen PHQ 2/9  Decreased Interest 0 3 3  Down, Depressed, Hopeless 0 3 3  PHQ - 2 Score 0 6 6  Altered sleeping 1 3  Tired, decreased energy 0 0  Change in appetite 1 0  Feeling bad or failure about  yourself  0 1  Trouble concentrating 0 0  Moving slowly or fidgety/restless 0 0  Suicidal thoughts 0 0  PHQ-9 Score 2 10  Difficult doing work/chores Not difficult at all Not difficult at all   Interpretation of Total Score  Total Score Depression Severity:  1-4 = Minimal depression, 5-9 = Mild depression, 10-14 = Moderate depression, 15-19 = Moderately severe depression, 20-27 = Severe depression   Psychosocial Evaluation and Intervention:  Psychosocial Evaluation - 10/07/22 5465  Psychosocial Evaluation & Interventions   Interventions Encouraged to exercise with the program and follow exercise prescription    Comments Pt has no barriers to participating in CR. She does report issues with her sleep, and she has no other identifiable psychosocial issues. She reports that she worked 32 years for [employer], and many of these years she worked nights. She thinks that this has permanently altered her sleep. She will sometimes take Advil PM to aid her sleep. She scored a 2 on her PHQ-9 and she relates this to her sleep and lack of appetite. She has still not regained her appetite since being in the hospital in October. She reports losing 15 lbs since her original admission. She reports having a good support system from her three children. Her goals while in the program are to return to her daily activites such as gardening. She only wants to do half of the program, but I told her to let us know if she changes her mind and wants to do the full program. She is eager to start the program.    Expected Outcomes Pt will continue to have no identifiable spsychosocial issues.    Continue Psychosocial Services  No Follow up required             Psychosocial Re-Evaluation:   Psychosocial Discharge (Final Psychosocial Re-Evaluation):   Vocational Rehabilitation: Provide vocational rehab assistance to qualifying candidates.   Vocational Rehab Evaluation & Intervention:  Vocational Rehab -  10/07/22 0907       Initial Vocational Rehab Evaluation & Intervention   Assessment shows need for Vocational Rehabilitation No      Vocational Rehab Re-Evaulation   Comments retired             Education: Education Goals: Education classes will be provided on a weekly basis, covering required topics. Participant will state understanding/return demonstration of topics presented.  Learning Barriers/Preferences:  Learning Barriers/Preferences - 10/07/22 0901       Learning Barriers/Preferences   Learning Barriers None    Learning Preferences Skilled Demonstration             Education Topics: Hypertension, Hypertension Reduction -Define heart disease and high blood pressure. Discus how high blood pressure affects the body and ways to reduce high blood pressure.   Exercise and Your Heart -Discuss why it is important to exercise, the FITT principles of exercise, normal and abnormal responses to exercise, and how to exercise safely.   Angina -Discuss definition of angina, causes of angina, treatment of angina, and how to decrease risk of having angina.   Cardiac Medications -Review what the following cardiac medications are used for, how they affect the body, and side effects that may occur when taking the medications.  Medications include Aspirin, Beta blockers, calcium channel blockers, ACE Inhibitors, angiotensin receptor blockers, diuretics, digoxin, and antihyperlipidemics.   Congestive Heart Failure -Discuss the definition of CHF, how to live with CHF, the signs and symptoms of CHF, and how keep track of weight and sodium intake.   Heart Disease and Intimacy -Discus the effect sexual activity has on the heart, how changes occur during intimacy as we age, and safety during sexual activity.   Smoking Cessation / COPD -Discuss different methods to quit smoking, the health benefits of quitting smoking, and the definition of COPD.   Nutrition I: Fats -Discuss  the types of cholesterol, what cholesterol does to the heart, and how cholesterol levels can be controlled.   Nutrition II: Labels -Discuss the  different components of food labels and how to read food label   Heart Parts/Heart Disease and PAD -Discuss the anatomy of the heart, the pathway of blood circulation through the heart, and these are affected by heart disease.   Stress I: Signs and Symptoms -Discuss the causes of stress, how stress may lead to anxiety and depression, and ways to limit stress.   Stress II: Relaxation -Discuss different types of relaxation techniques to limit stress.   Warning Signs of Stroke / TIA -Discuss definition of a stroke, what the signs and symptoms are of a stroke, and how to identify when someone is having stroke.   Knowledge Questionnaire Score:  Knowledge Questionnaire Score - 10/07/22 0903       Knowledge Questionnaire Score   Pre Score 15/24             Core Components/Risk Factors/Patient Goals at Admission:  Personal Goals and Risk Factors at Admission - 10/07/22 0907       Core Components/Risk Factors/Patient Goals on Admission   Improve shortness of breath with ADL's Yes    Intervention Provide education, individualized exercise plan and daily activity instruction to help decrease symptoms of SOB with activities of daily living.    Expected Outcomes Short Term: Improve cardiorespiratory fitness to achieve a reduction of symptoms when performing ADLs;Long Term: Be able to perform more ADLs without symptoms or delay the onset of symptoms    Heart Failure Yes    Intervention Provide a combined exercise and nutrition program that is supplemented with education, support and counseling about heart failure. Directed toward relieving symptoms such as shortness of breath, decreased exercise tolerance, and extremity edema.    Expected Outcomes Improve functional capacity of life;Short term: Attendance in program 2-3 days a week with  increased exercise capacity. Reported lower sodium intake. Reported increased fruit and vegetable intake. Reports medication compliance.;Short term: Daily weights obtained and reported for increase. Utilizing diuretic protocols set by physician.;Long term: Adoption of self-care skills and reduction of barriers for early signs and symptoms recognition and intervention leading to self-care maintenance.    Hypertension Yes    Intervention Provide education on lifestyle modifcations including regular physical activity/exercise, weight management, moderate sodium restriction and increased consumption of fresh fruit, vegetables, and low fat dairy, alcohol moderation, and smoking cessation.;Monitor prescription use compliance.    Expected Outcomes Short Term: Continued assessment and intervention until BP is < 140/62mm HG in hypertensive participants. < 130/43mm HG in hypertensive participants with diabetes, heart failure or chronic kidney disease.;Long Term: Maintenance of blood pressure at goal levels.    Personal Goal Other Yes    Personal Goal Return to daily activites such as gardening.    Intervention Attend CR and begin a home exercise program.    Expected Outcomes Pt will meet stated goals.             Core Components/Risk Factors/Patient Goals Review:    Core Components/Risk Factors/Patient Goals at Discharge (Final Review):    ITP Comments:   Comments: Patient arrived for 1st visit/orientation/education at 0800. Patient was referred to CR by Dr. Jenene Slicker due to Chronic Systolic Heart Failure (I50.22). During orientation advised patient on arrival and appointment times what to wear, what to do before, during and after exercise. Reviewed attendance and class policy.  Pt is scheduled to return Cardiac Rehab on 10/10/2022 at 0815. Pt was advised to come to class 15 minutes before class starts.  Discussed RPE/Dpysnea scales. Patient participated in warm up stretches. Patient  was able to  complete 6 minute walk test.  Telemetry: NSR with LBBB. Patient was measured for the equipment. Discussed equipment safety with patient. Took patient pre-anthropometric measurements. Patient finished visit at 0920.

## 2022-10-10 ENCOUNTER — Encounter (HOSPITAL_COMMUNITY)
Admission: RE | Admit: 2022-10-10 | Discharge: 2022-10-10 | Disposition: A | Discharge status: Home / Self Care | Payer: Medicare Other | Source: Ambulatory Visit | Attending: Internal Medicine | Admitting: Internal Medicine

## 2022-10-10 DIAGNOSIS — I5022 Chronic systolic (congestive) heart failure: Secondary | ICD-10-CM

## 2022-10-10 LAB — GLUCOSE, CAPILLARY: Glucose-Capillary: 152 mg/dL — ABNORMAL HIGH (ref 70–99)

## 2022-10-10 NOTE — Progress Notes (Signed)
Daily Session Note  Patient Details  Name: Summer Christensen MRN: [id number] Date of Birth: [birth year] Referring Provider:   Flowsheet Row CARDIAC REHAB PHASE II ORIENTATION from 10/07/2022 in [place]  Referring Provider Dr. Dellia Cloud       Encounter Date: 10/10/2022  Check In:  Session Check In - 10/10/22 0815       Check-In   Supervising physician immediately available to respond to emergencies [facility] MD immediately available    Physician(s) Dr. Dellia Cloud    Location [facility]    Staff Present Hoy Register MHA, MS, ACSM-CEP;Leana Roe, BS, Exercise Physiologist;Daphyne Hassell Done, RN, BSN    Virtual Visit No    Medication changes reported     No    Fall or balance concerns reported    No    Tobacco Cessation No Change    Warm-up and Cool-down Performed as group-led instruction    Resistance Training Performed Yes    VAD Patient? No    PAD/SET Patient? No      Pain Assessment   Currently in Pain? No/denies    Multiple Pain Sites No             Capillary Blood Glucose: Results for orders placed or performed during the hospital encounter of 10/10/22 (from the past 24 hour(s))  Glucose, capillary     Status: Abnormal   Collection Time: 10/10/22  8:09 AM  Result Value Ref Range   Glucose-Capillary 152 (H) 70 - 99 mg/dL      Social History   Tobacco Use  Smoking Status Never  Smokeless Tobacco Never    Goals Met:  Independence with exercise equipment Exercise tolerated well No report of concerns or symptoms today Strength training completed today  Goals Unmet:  Not Applicable  Comments: checkout time is 0915   Dr. Carlyle Dolly is Medical Director for [facility]

## 2022-10-11 ENCOUNTER — Telehealth: Payer: Self-pay | Admitting: Internal Medicine

## 2022-10-11 DIAGNOSIS — J9601 Acute respiratory failure with hypoxia: Secondary | ICD-10-CM

## 2022-10-11 NOTE — Telephone Encounter (Signed)
ONO ok, can d/c 02 if she wishes though  still (barely ) qualifies and fine to d/c

## 2022-10-13 ENCOUNTER — Encounter (HOSPITAL_COMMUNITY): Payer: Medicare Other

## 2022-10-14 NOTE — Telephone Encounter (Signed)
Called and notified patient off ONO results. She voiced understanding and asked we place an order to have it picked up. Order placed. Nothing further needed

## 2022-10-15 ENCOUNTER — Encounter (HOSPITAL_COMMUNITY)
Admission: RE | Admit: 2022-10-15 | Discharge: 2022-10-15 | Disposition: A | Discharge status: Home / Self Care | Payer: Medicare Other | Source: Ambulatory Visit | Attending: Internal Medicine | Admitting: Internal Medicine

## 2022-10-15 DIAGNOSIS — I5022 Chronic systolic (congestive) heart failure: Secondary | ICD-10-CM

## 2022-10-15 LAB — GLUCOSE, CAPILLARY: Glucose-Capillary: 218 mg/dL — ABNORMAL HIGH (ref 70–99)

## 2022-10-15 NOTE — Progress Notes (Signed)
Daily Session Note  Patient Details  Name: Summer Christensen MRN: [id number] Date of Birth: 1951/08/17 Referring Provider:   Flowsheet Row CARDIAC REHAB PHASE II ORIENTATION from 10/07/2022 in [place]  Referring Provider Dr. Dellia Cloud       Encounter Date: 10/15/2022  Check In:  Session Check In - 10/15/22 0809       Check-In   Supervising physician immediately available to respond to emergencies [facility] MD immediately available    Physician(s) Dr. Johnsie Cancel    Location [facility]    Staff Present Hoy Register MHA, MS, ACSM-CEP;[employer] BSN, RN;Heather Mel Almond, [place], Exercise Physiologist    Virtual Visit No    Medication changes reported     No    Fall or balance concerns reported    No    Tobacco Cessation No Change    Warm-up and Cool-down Performed as group-led instruction    Resistance Training Performed Yes    VAD Patient? No    PAD/SET Patient? No      Pain Assessment   Currently in Pain? No/denies    Multiple Pain Sites No             Capillary Blood Glucose: No results found for this or any previous visit (from the past 24 hour(s)).    Social History   Tobacco Use  Smoking Status Never  Smokeless Tobacco Never    Goals Met:  Independence with exercise equipment Exercise tolerated well No report of concerns or symptoms today Strength training completed today  Goals Unmet:  Not Applicable  Comments: checkout time is 0915   Dr. Carlyle Dolly is Medical Director for [facility]

## 2022-10-16 ENCOUNTER — Other Ambulatory Visit (HOSPITAL_COMMUNITY): Payer: Self-pay | Admitting: Emergency Medicine

## 2022-10-16 DIAGNOSIS — R1900 Intra-abdominal and pelvic swelling, mass and lump, unspecified site: Secondary | ICD-10-CM

## 2022-10-17 ENCOUNTER — Encounter (HOSPITAL_COMMUNITY)
Admission: RE | Admit: 2022-10-17 | Discharge: 2022-10-17 | Disposition: A | Discharge status: Home / Self Care | Payer: Medicare Other | Source: Ambulatory Visit | Attending: Internal Medicine | Admitting: Internal Medicine

## 2022-10-17 DIAGNOSIS — I5022 Chronic systolic (congestive) heart failure: Secondary | ICD-10-CM

## 2022-10-17 NOTE — Progress Notes (Signed)
Daily Session Note  Patient Details  Name: Summer Christensen MRN: [id number] Date of Birth: [birth year] Referring Provider:   Flowsheet Row CARDIAC REHAB PHASE II ORIENTATION from 10/07/2022 in [place]  Referring Provider Dr. Dellia Cloud       Encounter Date: 10/17/2022  Check In:  Session Check In - 10/17/22 0813       Check-In   Supervising physician immediately available to respond to emergencies [facility] MD immediately available    Physician(s) Dr. Johnsie Cancel    Location [facility]    Staff Present Leana Roe, BS, Exercise Physiologist;Dalton Sherrie George, MS, ACSM-CEP    Virtual Visit No    Medication changes reported     No    Fall or balance concerns reported    No    Tobacco Cessation No Change    Warm-up and Cool-down Performed as group-led instruction    Resistance Training Performed Yes    VAD Patient? No    PAD/SET Patient? No      Pain Assessment   Currently in Pain? No/denies    Multiple Pain Sites No             Capillary Blood Glucose: No results found for this or any previous visit (from the past 24 hour(s)).    Social History   Tobacco Use  Smoking Status Never  Smokeless Tobacco Never    Goals Met:  Independence with exercise equipment Exercise tolerated well No report of concerns or symptoms today Strength training completed today  Goals Unmet:  Not Applicable  Comments: check out 0915   Dr. Carlyle Dolly is Medical Director for [facility] [place]

## 2022-10-20 ENCOUNTER — Encounter (HOSPITAL_COMMUNITY): Payer: Medicare HMO

## 2022-10-22 ENCOUNTER — Encounter (HOSPITAL_COMMUNITY)
Admission: RE | Admit: 2022-10-22 | Discharge: 2022-10-22 | Disposition: A | Discharge status: Home / Self Care | Payer: Medicare HMO | Source: Ambulatory Visit | Attending: Internal Medicine | Admitting: Internal Medicine

## 2022-10-22 DIAGNOSIS — I509 Heart failure, unspecified: Secondary | ICD-10-CM | POA: Diagnosis present

## 2022-10-22 DIAGNOSIS — I5022 Chronic systolic (congestive) heart failure: Secondary | ICD-10-CM | POA: Insufficient documentation

## 2022-10-22 NOTE — Progress Notes (Signed)
Daily Session Note  Patient Details  Name: Summer Christensen MRN: [id number] Date of Birth: 1951-08-06 Referring Provider:   Flowsheet Row CARDIAC REHAB PHASE II ORIENTATION from 10/07/2022 in [place]  Referring Provider Dr. Dellia Cloud       Encounter Date: 10/22/2022  Check In:  Session Check In - 10/22/22 0815       Check-In   Supervising physician immediately available to respond to emergencies [facility] MD immediately available    Physician(s) Dr. Domenic Polite    Location [facility]    Staff Present Hoy Register MHA, MS, ACSM-CEP;[employer] BSN, RN;Heather Mel [place], [place], Exercise Physiologist    Virtual Visit No    Medication changes reported     No    Fall or balance concerns reported    No    Tobacco Cessation No Change    Warm-up and Cool-down Performed as group-led instruction    Resistance Training Performed Yes    VAD Patient? No    PAD/SET Patient? No      Pain Assessment   Currently in Pain? Yes    Pain Score 8     Pain Location Back    Pain Orientation Lower    Pain Descriptors / Indicators Sore    Pain Type Chronic pain    Pain Onset More than a month ago    Pain Frequency Intermittent    Multiple Pain Sites No             Capillary Blood Glucose: No results found for this or any previous visit (from the past 24 hour(s)).    Social History   Tobacco Use  Smoking Status Never  Smokeless Tobacco Never    Goals Met:  Independence with exercise equipment Exercise tolerated well No report of concerns or symptoms today Strength training completed today  Goals Unmet:  Not Applicable  Comments: checkout time is 0915   Dr. Carlyle Dolly is Medical Director for [facility]

## 2022-10-22 NOTE — Progress Notes (Signed)
Cardiac Individual Treatment Plan  Patient Details  Name: Summer Christensen MRN: [id number] Date of Birth: [birth year] Referring Provider:   Flowsheet Row CARDIAC REHAB PHASE II ORIENTATION from 10/07/2022 in [facility] CARDIAC REHABILITATION  Referring Provider Dr. Jenene Slicker       Initial Encounter Date:  Flowsheet Row CARDIAC REHAB PHASE II ORIENTATION from 10/07/2022 in [place] CARDIAC REHABILITATION  Date 10/07/22       Visit Diagnosis: Heart failure, chronic systolic (HCC)  Patient's Home Medications on Admission:  Current Outpatient Medications:    apixaban (ELIQUIS) 5 MG TABS tablet, 2 po bid thru 10/31 then 1 po BID starting 08/20/22, Disp: 60 tablet, Rfl: 2   aspirin EC 81 MG tablet, Take 81 mg by mouth daily. Swallow whole., Disp: , Rfl:    carvedilol (COREG) 12.5 MG tablet, Take 1 tablet (12.5 mg total) by mouth 2 (two) times daily., Disp: 180 tablet, Rfl: 1   empagliflozin (JARDIANCE) 10 MG TABS tablet, Take 1 tablet (10 mg total) by mouth daily., Disp: 30 tablet, Rfl: 2   furosemide (LASIX) 40 MG tablet, Take 1 tablet (40 mg total) by mouth daily., Disp: 30 tablet, Rfl: 2   losartan (COZAAR) 50 MG tablet, Take 1 tablet (50 mg total) by mouth daily., Disp: 30 tablet, Rfl: 1   pantoprazole (PROTONIX) 40 MG tablet, Take 1 tablet (40 mg total) by mouth daily., Disp: 30 tablet, Rfl: 2   Saccharomyces boulardii (PROBIOTIC) 250 MG CAPS, Take 1 capsule by mouth daily., Disp: , Rfl:    spironolactone (ALDACTONE) 25 MG tablet, Take 1 tablet (25 mg total) by mouth daily., Disp: 90 tablet, Rfl: 1  Past Medical History: 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   Chest pain    Hx of hysterectomy    Hypertension    Migraine     Tobacco Use: Social History   Tobacco Use  Smoking Status Never  Smokeless Tobacco Never    Labs: Review Flowsheet       Latest Ref Rng & Units 07/29/2022 07/30/2022 07/31/2022 08/02/2022  Labs for ITP  Cardiac and Pulmonary Rehab  Trlycerides <150 mg/dL - 096  - 438   Hemoglobin A1c 4.8 - 5.6 % 6.3  - - -  PH, Arterial 7.35 - 7.45 7.24  7.42  - -  PCO2 arterial 32 - 48 mmHg 53  35  - -  Bicarbonate 20.0 - 28.0 mmol/L 22.7  22.1  22.7  30.2  -  TCO2 22 - 32 mmol/L 24  - - -  Acid-base deficit 0.0 - 2.0 mmol/L 5.2  9.0  1.3  - -  O2 Saturation % 98.2  33.1  99  26.8  -    Capillary Blood Glucose: Lab Results  Component Value Date   GLUCAP 218 (H) 10/15/2022   GLUCAP 152 (H) 10/10/2022   GLUCAP 142 (H) 10/07/2022   GLUCAP 109 (H) 08/13/2022   GLUCAP 109 (H) 08/11/2022    POCT Glucose     Row Name 10/07/22 0831             POCT Blood Glucose   Pre-Exercise 142 mg/dL                Exercise Target Goals: Exercise Program Goal: Individual exercise prescription set using results from initial 6 min walk test and THRR while considering  patient's activity barriers and safety.   Exercise Prescription Goal: Starting with aerobic activity 30 plus minutes  a day, 3 days per week for initial exercise prescription. Provide home exercise prescription and guidelines that participant acknowledges understanding prior to discharge.  Activity Barriers & Risk Stratification:  Activity Barriers & Cardiac Risk Stratification - 10/07/22 0855       Activity Barriers & Cardiac Risk Stratification   Activity Barriers Back Problems;Joint Problems;Decreased Ventricular Function    Cardiac Risk Stratification High             6 Minute Walk:  6 Minute Walk     Row Name 10/07/22 0857         6 Minute Walk   Phase Initial     Distance 1400 feet     Walk Time 6 minutes     # of Rest Breaks 0     MPH 2.65     METS 2.99     RPE 11     VO2 Peak 10.48     Symptoms No     Resting HR 63 bpm     Resting BP 118/68     Resting Oxygen Saturation  97 %     Exercise Oxygen Saturation  during 6 min walk 97 %     Max Ex. HR 85 bpm     Max Ex. BP 136/68     2 Minute Post BP 122/68               Oxygen Initial Assessment:   Oxygen Re-Evaluation:   Oxygen Discharge (Final Oxygen Re-Evaluation):   Initial Exercise Prescription:  Initial Exercise Prescription - 10/07/22 0800       Date of Initial Exercise RX and Referring Provider   Date 10/07/22    Referring Provider Dr. Jenene Slicker    Expected Discharge Date 11/24/22      Treadmill   MPH 1.7    Grade 0    Minutes 17      Recumbant Elliptical   Level 1    RPM 60    Minutes 22      Prescription Details   Frequency (times per week) 3    Duration Progress to 30 minutes of continuous aerobic without signs/symptoms of physical distress      Intensity   THRR 40-80% of Max Heartrate 60-119    Ratings of Perceived Exertion 11-13      Resistance Training   Training Prescription Yes    Weight 3    Reps 10-15             Perform Capillary Blood Glucose checks as needed.  Exercise Prescription Changes:   Exercise Prescription Changes     Row Name 10/17/22 1200             Response to Exercise   Blood Pressure (Admit) 118/64       Blood Pressure (Exercise) 122/60       Blood Pressure (Exit) 116/62       Heart Rate (Admit) 82 bpm       Heart Rate (Exercise) 111 bpm       Heart Rate (Exit) 91 bpm       Rating of Perceived Exertion (Exercise) 11       Perceived Dyspnea (Exercise) 11       Duration Continue with 30 min of aerobic exercise without signs/symptoms of physical distress.       Intensity THRR unchanged         Progression   Progression Continue to progress workloads to maintain intensity without signs/symptoms of physical distress.  Resistance Training   Training Prescription Yes       Weight 3       Reps 10-15       Time 10 Minutes         Treadmill   MPH 2       Grade 0.5       Minutes 17       METs 2.67         Recumbant Elliptical   Level 2       RPM 57       Minutes 22       METs 4.5                Exercise Comments:   Exercise Goals  and Review:   Exercise Goals     Row Name 10/07/22 0902 10/21/22 1105           Exercise Goals   Increase Physical Activity Yes Yes      Intervention Provide advice, education, support and counseling about physical activity/exercise needs.;Develop an individualized exercise prescription for aerobic and resistive training based on initial evaluation findings, risk stratification, comorbidities and participant's personal goals. Provide advice, education, support and counseling about physical activity/exercise needs.;Develop an individualized exercise prescription for aerobic and resistive training based on initial evaluation findings, risk stratification, comorbidities and participant's personal goals.      Expected Outcomes Short Term: Attend rehab on a regular basis to increase amount of physical activity.;Long Term: Add in home exercise to make exercise part of routine and to increase amount of physical activity.;Long Term: Exercising regularly at least 3-5 days a week. Short Term: Attend rehab on a regular basis to increase amount of physical activity.;Long Term: Add in home exercise to make exercise part of routine and to increase amount of physical activity.;Long Term: Exercising regularly at least 3-5 days a week.      Increase Strength and Stamina Yes Yes      Intervention Provide advice, education, support and counseling about physical activity/exercise needs.;Develop an individualized exercise prescription for aerobic and resistive training based on initial evaluation findings, risk stratification, comorbidities and participant's personal goals. Provide advice, education, support and counseling about physical activity/exercise needs.;Develop an individualized exercise prescription for aerobic and resistive training based on initial evaluation findings, risk stratification, comorbidities and participant's personal goals.      Expected Outcomes Short Term: Increase workloads from initial  exercise prescription for resistance, speed, and METs.;Short Term: Perform resistance training exercises routinely during rehab and add in resistance training at home;Long Term: Improve cardiorespiratory fitness, muscular endurance and strength as measured by increased METs and functional capacity (6MWT) Short Term: Increase workloads from initial exercise prescription for resistance, speed, and METs.;Short Term: Perform resistance training exercises routinely during rehab and add in resistance training at home;Long Term: Improve cardiorespiratory fitness, muscular endurance and strength as measured by increased METs and functional capacity (6MWT)      Able to understand and use rate of perceived exertion (RPE) scale Yes Yes      Intervention Provide education and explanation on how to use RPE scale Provide education and explanation on how to use RPE scale      Expected Outcomes Short Term: Able to use RPE daily in rehab to express subjective intensity level;Long Term:  Able to use RPE to guide intensity level when exercising independently Short Term: Able to use RPE daily in rehab to express subjective intensity level;Long Term:  Able to use RPE to guide intensity level  when exercising independently      Knowledge and understanding of Target Heart Rate Range (THRR) Yes Yes      Intervention Provide education and explanation of THRR including how the numbers were predicted and where they are located for reference Provide education and explanation of THRR including how the numbers were predicted and where they are located for reference      Expected Outcomes Short Term: Able to use daily as guideline for intensity in rehab;Short Term: Able to state/look up THRR;Long Term: Able to use THRR to govern intensity when exercising independently --      Able to check pulse independently Yes Yes      Intervention Provide education and demonstration on how to check pulse in carotid and radial arteries.;Review the  importance of being able to check your own pulse for safety during independent exercise Provide education and demonstration on how to check pulse in carotid and radial arteries.;Review the importance of being able to check your own pulse for safety during independent exercise      Expected Outcomes Long Term: Able to check pulse independently and accurately;Short Term: Able to explain why pulse checking is important during independent exercise Long Term: Able to check pulse independently and accurately;Short Term: Able to explain why pulse checking is important during independent exercise      Understanding of Exercise Prescription Yes Yes      Intervention Provide education, explanation, and written materials on patient's individual exercise prescription Provide education, explanation, and written materials on patient's individual exercise prescription      Expected Outcomes Short Term: Able to explain program exercise prescription;Long Term: Able to explain home exercise prescription to exercise independently Short Term: Able to explain program exercise prescription;Long Term: Able to explain home exercise prescription to exercise independently               Exercise Goals Re-Evaluation :  Exercise Goals Re-Evaluation     Row Name 10/21/22 1105             Exercise Goal Re-Evaluation   Exercise Goals Review Increase Physical Activity;Increase Strength and Stamina;Able to understand and use rate of perceived exertion (RPE) scale;Knowledge and understanding of Target Heart Rate Range (THRR);Able to check pulse independently;Understanding of Exercise Prescription       Comments Pt has completed 4 sessions of CR. She is egar to get started with rehab and is motivated during class. She has incrreased her speed on the treadmill and level on the Ellp. She is currently exercising at 4.5 METs on the ellp. Will continue to monitor and update when able.       Expected Outcomes Through exercise at  rehab and home, the patient will meet their stated goals.                 Discharge Exercise Prescription (Final Exercise Prescription Changes):  Exercise Prescription Changes - 10/17/22 1200       Response to Exercise   Blood Pressure (Admit) 118/64    Blood Pressure (Exercise) 122/60    Blood Pressure (Exit) 116/62    Heart Rate (Admit) 82 bpm    Heart Rate (Exercise) 111 bpm    Heart Rate (Exit) 91 bpm    Rating of Perceived Exertion (Exercise) 11    Perceived Dyspnea (Exercise) 11    Duration Continue with 30 min of aerobic exercise without signs/symptoms of physical distress.    Intensity THRR unchanged      Progression   Progression Continue  to progress workloads to maintain intensity without signs/symptoms of physical distress.      Resistance Training   Training Prescription Yes    Weight 3    Reps 10-15    Time 10 Minutes      Treadmill   MPH 2    Grade 0.5    Minutes 17    METs 2.67      Recumbant Elliptical   Level 2    RPM 57    Minutes 22    METs 4.5             Nutrition:  Target Goals: Understanding of nutrition guidelines, daily intake of sodium 1500mg , cholesterol 200mg , calories 30% from fat and 7% or less from saturated fats, daily to have 5 or more servings of fruits and vegetables.  Biometrics:  Pre Biometrics - 10/07/22 0903       Pre Biometrics   Height 5\' 4"  (1.626 m)    Weight 65.4 kg    Waist Circumference 34 inches    Hip Circumference 38 inches    Waist to Hip Ratio 0.89 %    BMI (Calculated) 24.74    Triceps Skinfold 25 mm    % Body Fat 36.8 %    Grip Strength 19.4 kg    Flexibility 13 in    Single Leg Stand 19.85 seconds              Nutrition Therapy Plan and Nutrition Goals:  Nutrition Therapy & Goals - 10/15/22 1000       Personal Nutrition Goals   Comments Patient scored 21 on her diet assessment. We have 2 educational sessions on heart healthy nutrition with handouts and assistance with RD  referral if patient is interested.      Intervention Plan   Intervention Nutrition handout(s) given to patient.    Expected Outcomes Short Term Goal: Understand basic principles of dietary content, such as calories, fat, sodium, cholesterol and nutrients.             Nutrition Assessments:  Nutrition Assessments - 10/07/22 0858       MEDFICTS Scores   Pre Score 21            MEDIFICTS Score Key: ?70 Need to make dietary changes  40-70 Heart Healthy Diet ? 40 Therapeutic Level Cholesterol Diet   Picture Your Plate Scores: <16<40 Unhealthy dietary pattern with much room for improvement. 41-50 Dietary pattern unlikely to meet recommendations for good health and room for improvement. 51-60 More healthful dietary pattern, with some room for improvement.  >60 Healthy dietary pattern, although there may be some specific behaviors that could be improved.    Nutrition Goals Re-Evaluation:   Nutrition Goals Discharge (Final Nutrition Goals Re-Evaluation):   Psychosocial: Target Goals: Acknowledge presence or absence of significant depression and/or stress, maximize coping skills, provide positive support system. Participant is able to verbalize types and ability to use techniques and skills needed for reducing stress and depression.  Initial Review & Psychosocial Screening:  Initial Psych Review & Screening - 10/07/22 0857       Initial Review   Current issues with Current Sleep Concerns      Family Dynamics   Good Support System? Yes    Comments Her support system includes her three children.      Barriers   Psychosocial barriers to participate in program There are no identifiable barriers or psychosocial needs.      Screening Interventions   Interventions Encouraged to  exercise    Expected Outcomes Long Term goal: The participant improves quality of Life and PHQ9 Scores as seen by post scores and/or verbalization of changes;Short Term goal: Identification and review  with participant of any Quality of Life or Depression concerns found by scoring the questionnaire.             Quality of Life Scores:  Quality of Life - 10/07/22 1146       Quality of Life   Select Quality of Life            Scores of 19 and below usually indicate a poorer quality of life in these areas.  A difference of  2-3 points is a clinically meaningful difference.  A difference of 2-3 points in the total score of the Quality of Life Index has been associated with significant improvement in overall quality of life, self-image, physical symptoms, and general health in studies assessing change in quality of life.  PHQ-9: Review Flowsheet       10/07/2022 11/20/2017  Depression screen PHQ 2/9  Decreased Interest 0 3 3  Down, Depressed, Hopeless 0 3 3  PHQ - 2 Score 0 6 6  Altered sleeping 1 3  Tired, decreased energy 0 0  Change in appetite 1 0  Feeling bad or failure about yourself  0 1  Trouble concentrating 0 0  Moving slowly or fidgety/restless 0 0  Suicidal thoughts 0 0  PHQ-9 Score 2 10  Difficult doing work/chores Not difficult at all Not difficult at all   Interpretation of Total Score  Total Score Depression Severity:  1-4 = Minimal depression, 5-9 = Mild depression, 10-14 = Moderate depression, 15-19 = Moderately severe depression, 20-27 = Severe depression   Psychosocial Evaluation and Intervention:  Psychosocial Evaluation - 10/07/22 0933       Psychosocial Evaluation & Interventions   Interventions Encouraged to exercise with the program and follow exercise prescription    Comments Pt has no barriers to participating in CR. She does report issues with her sleep, and she has no other identifiable psychosocial issues. She reports that she worked 32 years for [employer], and many of these years she worked nights. She thinks that this has permanently altered her sleep. She will sometimes take Advil PM to aid her sleep. She scored a 2 on her PHQ-9 and she  relates this to her sleep and lack of appetite. She has still not regained her appetite since being in the hospital in October. She reports losing 15 lbs since her original admission. She reports having a good support system from her three children. Her goals while in the program are to return to her daily activites such as gardening. She only wants to do half of the program, but I told her to let us know if she changes her mind and wants to do the full program. She is eager to start the program.    Expected Outcomes Pt will continue to have no identifiable spsychosocial issues.    Continue Psychosocial Services  No Follow up required             Psychosocial Re-Evaluation:  Psychosocial Re-Evaluation     Row Name 10/15/22 1001             Psychosocial Re-Evaluation   Current issues with Current Sleep Concerns       Comments Patient is new to the program. She has completed 2 sessions. She continues to have no psychosocial barriers identified. She  continues to use Advil PM for sleep. We will continue to monitor her progress.       Expected Outcomes Patient will continue to have no psychosocial barriers identified.       Interventions Relaxation education;Stress management education;Encouraged to attend Cardiac Rehabilitation for the exercise       Continue Psychosocial Services  No Follow up required                Psychosocial Discharge (Final Psychosocial Re-Evaluation):  Psychosocial Re-Evaluation - 10/15/22 1001       Psychosocial Re-Evaluation   Current issues with Current Sleep Concerns    Comments Patient is new to the program. She has completed 2 sessions. She continues to have no psychosocial barriers identified. She continues to use Advil PM for sleep. We will continue to monitor her progress.    Expected Outcomes Patient will continue to have no psychosocial barriers identified.    Interventions Relaxation education;Stress management education;Encouraged to attend  Cardiac Rehabilitation for the exercise    Continue Psychosocial Services  No Follow up required             Vocational Rehabilitation: Provide vocational rehab assistance to qualifying candidates.   Vocational Rehab Evaluation & Intervention:  Vocational Rehab - 10/07/22 0907       Initial Vocational Rehab Evaluation & Intervention   Assessment shows need for Vocational Rehabilitation No      Vocational Rehab Re-Evaulation   Comments retired             Education: Education Goals: Education classes will be provided on a weekly basis, covering required topics. Participant will state understanding/return demonstration of topics presented.  Learning Barriers/Preferences:  Learning Barriers/Preferences - 10/07/22 0901       Learning Barriers/Preferences   Learning Barriers None    Learning Preferences Skilled Demonstration             Education Topics: Hypertension, Hypertension Reduction -Define heart disease and high blood pressure. Discus how high blood pressure affects the body and ways to reduce high blood pressure.   Exercise and Your Heart -Discuss why it is important to exercise, the FITT principles of exercise, normal and abnormal responses to exercise, and how to exercise safely.   Angina -Discuss definition of angina, causes of angina, treatment of angina, and how to decrease risk of having angina.   Cardiac Medications -Review what the following cardiac medications are used for, how they affect the body, and side effects that may occur when taking the medications.  Medications include Aspirin, Beta blockers, calcium channel blockers, ACE Inhibitors, angiotensin receptor blockers, diuretics, digoxin, and antihyperlipidemics.   Congestive Heart Failure -Discuss the definition of CHF, how to live with CHF, the signs and symptoms of CHF, and how keep track of weight and sodium intake.   Heart Disease and Intimacy -Discus the effect sexual  activity has on the heart, how changes occur during intimacy as we age, and safety during sexual activity.   Smoking Cessation / COPD -Discuss different methods to quit smoking, the health benefits of quitting smoking, and the definition of COPD.   Nutrition I: Fats -Discuss the types of cholesterol, what cholesterol does to the heart, and how cholesterol levels can be controlled.   Nutrition II: Labels -Discuss the different components of food labels and how to read food label   Heart Parts/Heart Disease and PAD -Discuss the anatomy of the heart, the pathway of blood circulation through the heart, and these are affected by heart  disease.   Stress I: Signs and Symptoms -Discuss the causes of stress, how stress may lead to anxiety and depression, and ways to limit stress.   Stress II: Relaxation -Discuss different types of relaxation techniques to limit stress. Flowsheet Row CARDIAC REHAB PHASE II EXERCISE from 10/15/2022 in [place]  Date 10/15/22  Educator DF  Instruction Review Code 2- Demonstrated Understanding       Warning Signs of Stroke / TIA -Discuss definition of a stroke, what the signs and symptoms are of a stroke, and how to identify when someone is having stroke.   Knowledge Questionnaire Score:  Knowledge Questionnaire Score - 10/07/22 0903       Knowledge Questionnaire Score   Pre Score 15/24             Core Components/Risk Factors/Patient Goals at Admission:  Personal Goals and Risk Factors at Admission - 10/07/22 0907       Core Components/Risk Factors/Patient Goals on Admission   Improve shortness of breath with ADL's Yes    Intervention Provide education, individualized exercise plan and daily activity instruction to help decrease symptoms of SOB with activities of daily living.    Expected Outcomes Short Term: Improve cardiorespiratory fitness to achieve a reduction of symptoms when performing ADLs;Long Term: Be  able to perform more ADLs without symptoms or delay the onset of symptoms    Heart Failure Yes    Intervention Provide a combined exercise and nutrition program that is supplemented with education, support and counseling about heart failure. Directed toward relieving symptoms such as shortness of breath, decreased exercise tolerance, and extremity edema.    Expected Outcomes Improve functional capacity of life;Short term: Attendance in program 2-3 days a week with increased exercise capacity. Reported lower sodium intake. Reported increased fruit and vegetable intake. Reports medication compliance.;Short term: Daily weights obtained and reported for increase. Utilizing diuretic protocols set by physician.;Long term: Adoption of self-care skills and reduction of barriers for early signs and symptoms recognition and intervention leading to self-care maintenance.    Hypertension Yes    Intervention Provide education on lifestyle modifcations including regular physical activity/exercise, weight management, moderate sodium restriction and increased consumption of fresh fruit, vegetables, and low fat dairy, alcohol moderation, and smoking cessation.;Monitor prescription use compliance.    Expected Outcomes Short Term: Continued assessment and intervention until BP is < 140/27mm HG in hypertensive participants. < 130/63mm HG in hypertensive participants with diabetes, heart failure or chronic kidney disease.;Long Term: Maintenance of blood pressure at goal levels.    Personal Goal Other Yes    Personal Goal Return to daily activites such as gardening.    Intervention Attend CR and begin a home exercise program.    Expected Outcomes Pt will meet stated goals.             Core Components/Risk Factors/Patient Goals Review:   Goals and Risk Factor Review     Row Name 10/15/22 1003             Core Components/Risk Factors/Patient Goals Review   Personal Goals Review Improve shortness of breath with  ADL's;Hypertension;Other;Heart Failure;Diabetes       Review Patient was referred to CR with chronic systolic HF. She has multiple risk factors for CAD and is participating in the program for risk modification. She has completed 2 sessions. Her current weight is 144.3 lbs maintained from her orientation visit. Her blood pressure is well controlled. Her DM is managed with Jardiance. Her last A1C  was 07/29/22 at 6.3%. Her personal goals for the program are to be able to return to her daily activities such as gardening. We will continue to monitor her progress as she works towards meeting these goals.       Expected Outcomes Patient will complete the program meeting both personal and program goals.                Core Components/Risk Factors/Patient Goals at Discharge (Final Review):   Goals and Risk Factor Review - 10/15/22 1003       Core Components/Risk Factors/Patient Goals Review   Personal Goals Review Improve shortness of breath with ADL's;Hypertension;Other;Heart Failure;Diabetes    Review Patient was referred to CR with chronic systolic HF. She has multiple risk factors for CAD and is participating in the program for risk modification. She has completed 2 sessions. Her current weight is 144.3 lbs maintained from her orientation visit. Her blood pressure is well controlled. Her DM is managed with Jardiance. Her last A1C was 07/29/22 at 6.3%. Her personal goals for the program are to be able to return to her daily activities such as gardening. We will continue to monitor her progress as she works towards meeting these goals.    Expected Outcomes Patient will complete the program meeting both personal and program goals.             ITP Comments:   Comments: ITP REVIEW Pt is making expected progress toward Cardiac Rehab goals after completing 4 sessions. Recommend continued exercise, life style modification, education, and increased stamina and strength.

## 2022-10-24 ENCOUNTER — Encounter (HOSPITAL_COMMUNITY): Payer: Medicare HMO

## 2022-10-27 ENCOUNTER — Telehealth: Payer: Self-pay | Admitting: Internal Medicine

## 2022-10-27 ENCOUNTER — Encounter (HOSPITAL_COMMUNITY): Payer: Medicare HMO

## 2022-10-27 NOTE — Telephone Encounter (Signed)
Pt c/o Shortness Of Breath: STAT if SOB developed within the last 24 hours or pt is noticeably SOB on the phone  1. Are you currently SOB (can you hear that pt is SOB on the phone)?   Yes  2. How long have you been experiencing SOB?   Last 3 days  3. Are you SOB when sitting or when up moving around?   Moving around  4. Are you currently experiencing any other symptoms?  Dizziness on Saturday  Patient stated she has a cold and she is not sure if this is contributing to her SOB. Patient would like advice on next steps,

## 2022-10-27 NOTE — Telephone Encounter (Signed)
Reports having a cold since Friday with cough, congestion & SOB. Did not sound SOB on phone and was able to make full sentences without pausing or hesitation. Denies fever, chest pain, sore throat, weight gain, swelling or headache. BP today 111/60 & HR 90. Has not checked O2 Sat. Advised to contact PCP or go to Urgent Care. Verbalized understanding.

## 2022-10-29 ENCOUNTER — Encounter (HOSPITAL_COMMUNITY): Payer: Medicare HMO

## 2022-10-31 ENCOUNTER — Encounter (HOSPITAL_COMMUNITY): Payer: Medicare HMO

## 2022-11-03 ENCOUNTER — Encounter (HOSPITAL_COMMUNITY): Payer: Medicare HMO

## 2022-11-03 ENCOUNTER — Other Ambulatory Visit (HOSPITAL_COMMUNITY)
Admission: RE | Admit: 2022-11-03 | Discharge: 2022-11-03 | Disposition: A | Discharge status: Home / Self Care | Payer: Medicare HMO | Source: Ambulatory Visit | Attending: Internal Medicine | Admitting: Internal Medicine

## 2022-11-03 ENCOUNTER — Encounter (INDEPENDENT_AMBULATORY_CARE_PROVIDER_SITE_OTHER): Payer: Medicare HMO | Admitting: Internal Medicine

## 2022-11-03 ENCOUNTER — Encounter: Payer: Self-pay | Admitting: Internal Medicine

## 2022-11-03 VITALS — BP 112/58 | HR 84 | Ht 64.0 in | Wt 144.0 lb

## 2022-11-03 DIAGNOSIS — I509 Heart failure, unspecified: Secondary | ICD-10-CM | POA: Insufficient documentation

## 2022-11-03 LAB — BRAIN NATRIURETIC PEPTIDE: B Natriuretic Peptide: 198 pg/mL — ABNORMAL HIGH (ref 0.0–100.0)

## 2022-11-03 NOTE — Patient Instructions (Signed)
Medication Instructions:  Your physician recommends that you continue on your current medications as directed. Please refer to the Current Medication list given to you today.   Labwork: BNP- Today  Testing/Procedures: Your physician has requested that you have an echocardiogram. Echocardiography is a painless test that uses sound waves to create images of your heart. It provides your doctor with information about the size and shape of your heart and how well your heart's chambers and valves are working. This procedure takes approximately one hour. There are no restrictions for this procedure. Please do NOT wear cologne, perfume, aftershave, or lotions (deodorant is allowed). Please arrive 15 minutes prior to your appointment time.   Follow-Up: Follow up with Dr. Dellia Cloud in 4 months.   Any Other Special Instructions Will Be Listed Below (If Applicable).     If you need a refill on your cardiac medications before your next appointment, please call your pharmacy.

## 2022-11-04 NOTE — Progress Notes (Signed)
Cardiology Office Note  Date: 11/04/2022   ID: Shelsie, Tijerino 11/06/1950, MRN [id number]  PCP:  Vidal Schwalbe, MD  Cardiologist:  Chalmers Guest, MD Electrophysiologist:  None   Reason for Office Visit: Follow-up of NICM   History of Present Illness: Summer Christensen is a 72 y.o. female known to have HTN, NICM (diagnosed in 07/2022) was referred to cardiology clinic for follow-up visit.  Patient was admitted to [facility] in 07/2022 with severe sepsis secondary to pneumonia and new onset cardiomyopathy with LVEF less than 20%. She was then discharged on antibiotics and outpatient ischemic evaluation was warranted.  She was subsequently admitted again in 2 weeks and was diagnosed with LUL pulmonary embolus and acute DVT of her right leg for which she is currently on Eliquis. She underwent a CTA cardiac that showed coronary calcium score of 53.8 and mild calcified plaque in the ostial LAD. Patient is here for a follow-up visit. She reported having fatigue since hospital discharge but otherwise denied any angina,, lightness, dizziness, LE swelling.  She has cold for the last 10 days to 2 weeks due to which her sinuses were congested and has SOB. She is compliant with her medications and has no side effects. She denies smoking cigarettes, illicit drug abuse or alcohol use.  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   ROTATOR CUFF REPAIR  2009   arthroscopic   TUBAL LIGATION     bilateral    Current Outpatient Medications  Medication Sig Dispense Refill    apixaban (ELIQUIS) 5 MG TABS tablet 2 po bid thru 10/31 then 1 po BID starting 08/20/22 60 tablet 2   aspirin EC 81 MG tablet Take 81 mg by mouth daily. Swallow whole.     carvedilol (COREG) 12.5 MG tablet Take 1 tablet (12.5 mg total) by mouth 2 (two) times daily. 180 tablet 1   cefdinir (OMNICEF) 300 MG capsule 1 capsule Orally twice a day for 10 days     empagliflozin (JARDIANCE) 10 MG TABS tablet Take 1 tablet (10 mg total) by mouth daily. 30 tablet 2   furosemide (LASIX) 40 MG tablet Take 1 tablet (40 mg total) by mouth daily. 30 tablet 2   losartan (COZAAR) 50 MG tablet Take 1 tablet (50 mg total) by mouth daily. 30 tablet 1   pantoprazole (PROTONIX) 40 MG tablet Take 1 tablet (40 mg total) by mouth daily. 30 tablet 2   Saccharomyces boulardii (PROBIOTIC) 250 MG CAPS Take 1 capsule by mouth daily.     spironolactone (ALDACTONE) 25 MG tablet Take 1 tablet (25 mg total) by mouth daily. 90 tablet 1   No current facility-administered medications for this visit.   Allergies:  Patient has no known allergies.   Social History: The patient  reports that she has never smoked. She has never used smokeless tobacco. She reports that she does not drink alcohol and does not use drugs.   Family History: The patient's family  history includes Heart attack in her father; Stroke in her mother.   ROS:  Please see the history of present illness. Otherwise, complete review of systems is positive for none.  All other systems are reviewed and negative.   Physical Exam: VS:  BP (!) 112/58   Pulse 84   Ht 5\' 4"  (1.626 m)   Wt 144 lb (65.3 kg)   SpO2 99%   BMI 24.72 kg/m , BMI Body mass index is 24.72 kg/m.  Wt Readings from Last 3 Encounters:  11/03/22 144 lb (65.3 kg)  10/07/22 144 lb 2.9 oz (65.4 kg)  09/29/22 142 lb (64.4 kg)    General: Patient appears comfortable at rest. HEENT: Conjunctiva and lids normal, oropharynx clear with moist mucosa. Neck: Supple, no elevated JVP or carotid bruits, no  thyromegaly. Lungs: Clear to auscultation, nonlabored breathing at rest. Cardiac: Regular rate and rhythm, no S3 or significant systolic murmur, no pericardial rub. Abdomen: Soft, nontender, no hepatomegaly, bowel sounds present, no guarding or rebound. Extremities: No pitting edema, distal pulses 2+. Skin: Warm and dry. Musculoskeletal: No kyphosis. Neuropsychiatric: Alert and oriented x3, affect grossly appropriate.  ECG: Normal sinus rhythm and left bundle branch block  Recent Labwork: 08/11/2022: Magnesium 2.3 08/22/2022: ALT 76; AST 35; BUN 29; Creatinine, Ser 1.08; Hemoglobin 11.7; Platelets 414; Potassium 3.1; Sodium 137 11/03/2022: B Natriuretic Peptide 198.0     Component Value Date/Time   TRIG 123 08/02/2022 0352    Other Studies Reviewed Today: Echo from 07/2022 LVEF 20 to 25% with anteroseptal akinesis RV systolic function is normal and RV size normal Mildly elevated pulmonary artery systolic pressures  Assessment and Plan: Patient is a 72 year old F known to have HTN, new onset cardiomyopathy (diagnosed in 07/2022), PE/DVT diagnosed in 7/23 on Eliquis presented to cardiology clinic for follow-up visit.  # NICM, LVEF 20 to 25% with no device (BNP 198 on 11/03/2022) -Patient has SOB and sinus congestion for the last 2 weeks due to cold. Physical exam patient shows patient is compensated. BNP today is 198. I would recommend to treat cold and not uptitrate any of her GDMT medications. -Continue Lasix 40 mg once daily -Continue carvedilol 12.5 mg twice daily -Continue losartan 50 mg once daily (ideally would prefer to switch to Entresto 24-26 mg twice daily but patient will want to check with her insurance about her co-pay and get back to me) -Continue Jardiance 10 mg once daily -Continue spironolactone 25 mg once daily -Obtain 2D echocardiogram in 3 months for LVEF assessment  # Acute LUL PE # Acute R DVT -Continue Eliquis 5 mg twice daily for total duration of 6  months -Currently she is being $190 per month for Eliquis co-pay and wants to think about switching to Coumadin but reluctant due to frequent INR checks.  # HLD, unknown value -Continue atorvastatin 40 mg nightly.  # Liver mass on CT cardiac from 10/08/2022 -Suspected mass lesion in the right lobe of liver concerning for tumor, MR imaging with and without contrast was recommended. Follow-up with PCP for further MRI imaging.  I have spent a total of 32 minutes with patient reviewing chart, EKGs, labs and examining patient as well as establishing an assessment and plan that was discussed with the patient.  > 50% of time was spent in direct patient care.      Medication Adjustments/Labs and Tests Ordered: Current medicines are reviewed at length with the patient today.  Concerns regarding medicines are outlined above.   Tests Ordered: Orders  Placed This Encounter  Procedures   B Nat Peptide   ECHOCARDIOGRAM COMPLETE    Medication Changes: No orders of the defined types were placed in this encounter.   Disposition:  Follow up  4 month  Signed Fergus Throne Fidel Levy, MD, 11/04/2022 9:21 AM    [facility] [place], [place],  [phone]

## 2022-11-05 ENCOUNTER — Encounter (HOSPITAL_COMMUNITY): Payer: Medicare HMO

## 2022-11-07 ENCOUNTER — Encounter (HOSPITAL_COMMUNITY): Payer: Medicare HMO

## 2022-11-10 ENCOUNTER — Encounter (HOSPITAL_COMMUNITY)
Admission: RE | Admit: 2022-11-10 | Discharge: 2022-11-10 | Disposition: A | Discharge status: Home / Self Care | Payer: Medicare HMO | Source: Ambulatory Visit | Attending: Internal Medicine | Admitting: Internal Medicine

## 2022-11-10 DIAGNOSIS — I5022 Chronic systolic (congestive) heart failure: Secondary | ICD-10-CM

## 2022-11-10 NOTE — Progress Notes (Signed)
Daily Session Note  Patient Details  Name: Summer Christensen MRN: [id number] Date of Birth: May 18, 1951 Referring Provider:   Flowsheet Row CARDIAC REHAB PHASE II ORIENTATION from 10/07/2022 in [place]  Referring Provider Dr. Dellia Cloud       Encounter Date: 11/10/2022  Check In:  Session Check In - 11/10/22 0815       Check-In   Supervising physician immediately available to respond to emergencies [facility] MD immediately available    Physician(s) Dr. Harl Bowie    Location [facility]    Staff Present Hoy Register MHA, MS, ACSM-CEP;Leana Roe, BS, Exercise Physiologist;Daphyne Hassell Done, RN, BSN    Virtual Visit No    Medication changes reported     No    Fall or balance concerns reported    No    Tobacco Cessation No Change    Warm-up and Cool-down Performed as group-led instruction    Resistance Training Performed Yes    VAD Patient? No    PAD/SET Patient? No      Pain Assessment   Currently in Pain? No/denies    Pain Score 0-No pain    Multiple Pain Sites No             Capillary Blood Glucose: No results found for this or any previous visit (from the past 24 hour(s)).    Social History   Tobacco Use  Smoking Status Never  Smokeless Tobacco Never    Goals Met:  Independence with exercise equipment Exercise tolerated well No report of concerns or symptoms today Strength training completed today  Goals Unmet:  Not Applicable  Comments: checkout time is 0915   Dr. Carlyle Dolly is Medical Director for [place]

## 2022-11-11 ENCOUNTER — Ambulatory Visit (HOSPITAL_COMMUNITY)
Admission: RE | Admit: 2022-11-11 | Discharge: 2022-11-11 | Disposition: A | Discharge status: Home / Self Care | Payer: Medicare HMO | Source: Ambulatory Visit | Attending: Emergency Medicine | Admitting: Emergency Medicine

## 2022-11-11 DIAGNOSIS — R1900 Intra-abdominal and pelvic swelling, mass and lump, unspecified site: Secondary | ICD-10-CM | POA: Diagnosis present

## 2022-11-11 MED ORDER — GADOBUTROL 1 MMOL/ML IV SOLN
7.0000 mL | Freq: Once | INTRAVENOUS | Status: AC | PRN
  Administered 2022-11-11: 7 mL via INTRAVENOUS

## 2022-11-12 ENCOUNTER — Encounter (HOSPITAL_COMMUNITY)
Admission: RE | Admit: 2022-11-12 | Discharge: 2022-11-12 | Disposition: A | Discharge status: Home / Self Care | Payer: Medicare HMO | Source: Ambulatory Visit | Attending: Internal Medicine | Admitting: Internal Medicine

## 2022-11-12 DIAGNOSIS — I5022 Chronic systolic (congestive) heart failure: Secondary | ICD-10-CM

## 2022-11-12 NOTE — Progress Notes (Signed)
Daily Session Note  Patient Details  Name: Summer Christensen MRN: [id number] Date of Birth: 1951-05-20 Referring Provider:   Flowsheet Row CARDIAC REHAB PHASE II ORIENTATION from 10/07/2022 in [place]  Referring Provider Dr. Dellia Cloud       Encounter Date: 11/12/2022  Check In:  Session Check In - 11/12/22 0815       Check-In   Supervising physician immediately available to respond to emergencies [facility] MD immediately available    Physician(s) Dr. Domenic Polite    Location [facility]    Staff Present Leana Roe, BS, Exercise Physiologist;Dalton Sherrie George, MS, ACSM-CEP    Virtual Visit No    Medication changes reported     No    Fall or balance concerns reported    No    Tobacco Cessation No Change    Warm-up and Cool-down Performed as group-led instruction    Resistance Training Performed Yes    VAD Patient? No    PAD/SET Patient? No      Pain Assessment   Currently in Pain? No/denies    Pain Score 0-No pain    Multiple Pain Sites No             Capillary Blood Glucose: No results found for this or any previous visit (from the past 24 hour(s)).    Social History   Tobacco Use  Smoking Status Never  Smokeless Tobacco Never    Goals Met:  Independence with exercise equipment Exercise tolerated well No report of concerns or symptoms today Strength training completed today  Goals Unmet:  Not Applicable  Comments: check out 0915   Dr. Carlyle Dolly is Medical Director for [place]

## 2022-11-13 ENCOUNTER — Telehealth: Payer: Self-pay | Admitting: Internal Medicine

## 2022-11-13 NOTE — Telephone Encounter (Signed)
Routing to patient's PCP

## 2022-11-14 ENCOUNTER — Encounter (HOSPITAL_COMMUNITY)
Admission: RE | Admit: 2022-11-14 | Discharge: 2022-11-14 | Disposition: A | Discharge status: Home / Self Care | Payer: Medicare HMO | Source: Ambulatory Visit | Attending: Internal Medicine | Admitting: Internal Medicine

## 2022-11-14 DIAGNOSIS — I5022 Chronic systolic (congestive) heart failure: Secondary | ICD-10-CM

## 2022-11-14 NOTE — Progress Notes (Signed)
Daily Session Note  Patient Details  Name: Summer Christensen MRN: [id number] Date of Birth: July 22, 1951 Referring Provider:   Flowsheet Row CARDIAC REHAB PHASE II ORIENTATION from 10/07/2022 in [place]  Referring Provider Dr. Dellia Cloud       Encounter Date: 11/14/2022  Check In:  Session Check In - 11/14/22 0815       Check-In   Supervising physician immediately available to respond to emergencies [facility] MD immediately available    Physician(s) Dr. Domenic Polite    Location [facility]    Staff Present Leana Roe, BS, Exercise Physiologist;Dalton Sherrie George, MS, ACSM-CEP    Virtual Visit No    Medication changes reported     No    Fall or balance concerns reported    No    Tobacco Cessation No Change    Warm-up and Cool-down Performed as group-led instruction    Resistance Training Performed Yes    VAD Patient? No    PAD/SET Patient? No      Pain Assessment   Currently in Pain? No/denies    Pain Score 0-No pain    Multiple Pain Sites No             Capillary Blood Glucose: No results found for this or any previous visit (from the past 24 hour(s)).    Social History   Tobacco Use  Smoking Status Never  Smokeless Tobacco Never    Goals Met:  Independence with exercise equipment Exercise tolerated well  Goals Unmet:  Not Applicable  Comments: check out 0915   Dr. Carlyle Dolly is Medical Director for [place]

## 2022-11-17 ENCOUNTER — Encounter (HOSPITAL_COMMUNITY)
Admission: RE | Admit: 2022-11-17 | Discharge: 2022-11-17 | Disposition: A | Discharge status: Home / Self Care | Payer: Medicare HMO | Source: Ambulatory Visit | Attending: Internal Medicine | Admitting: Internal Medicine

## 2022-11-17 VITALS — Wt 146.6 lb

## 2022-11-17 DIAGNOSIS — I5022 Chronic systolic (congestive) heart failure: Secondary | ICD-10-CM | POA: Diagnosis not present

## 2022-11-17 NOTE — Progress Notes (Signed)
Daily Session Note  Patient Details  Name: Summer Christensen MRN: [id number] Date of Birth: 04-06-1951 Referring Provider:   Flowsheet Row CARDIAC REHAB PHASE II ORIENTATION from 10/07/2022 in [place]  Referring Provider Dr. Dellia Cloud       Encounter Date: 11/17/2022  Check In:  Session Check In - 11/17/22 0812       Check-In   Supervising physician immediately available to respond to emergencies [facility] MD immediately available    Physician(s) Dr. Dellia Cloud    Location [facility]    Staff Present Hoy Register MHA, MS, ACSM-CEP;Leana Roe, BS, Exercise Physiologist    Virtual Visit No    Medication changes reported     No    Fall or balance concerns reported    No    Tobacco Cessation No Change    Warm-up and Cool-down Performed as group-led instruction    Resistance Training Performed Yes    VAD Patient? No    PAD/SET Patient? No      Pain Assessment   Currently in Pain? No/denies    Pain Score 0-No pain    Multiple Pain Sites No             Capillary Blood Glucose: No results found for this or any previous visit (from the past 24 hour(s)).    Social History   Tobacco Use  Smoking Status Never  Smokeless Tobacco Never    Goals Met:  Independence with exercise equipment Exercise tolerated well No report of concerns or symptoms today Strength training completed today  Goals Unmet:  Not Applicable  Comments: checkout time is 0915   Dr. Carlyle Dolly is Medical Director for [place]

## 2022-11-17 NOTE — Progress Notes (Signed)
Daily Session Note  Patient Details  Name: Summer Christensen MRN: [id number] Date of Birth: 08/13/1951 Referring Provider:   Flowsheet Row CARDIAC REHAB PHASE II ORIENTATION from 10/07/2022 in [place] [facility]  Referring Provider Dr. Mallipeddi       Encounter Date: 11/17/2022  Check In:  Session Check In - 11/17/22 0812       Check-In   Supervising physician immediately available to respond to emergencies [facility] MD immediately available    Physician(s) Dr. mallipeddi    Location [facility]    Staff Present Maria Coin MHA, MS, ACSM-CEP;Heather Bailey, BS, Exercise Physiologist    Virtual Visit No    Medication changes reported     No    Fall or balance concerns reported    No    Tobacco Cessation No Change    Warm-up and Cool-down Performed as group-led instruction    Resistance Training Performed Yes    VAD Patient? No    PAD/SET Patient? No      Pain Assessment   Currently in Pain? No/denies    Pain Score 0-No pain    Multiple Pain Sites No             Capillary Blood Glucose: No results found for this or any previous visit (from the past 24 hour(s)).    Social History   Tobacco Use  Smoking Status Never  Smokeless Tobacco Never    Goals Met:  Independence with exercise equipment Exercise tolerated well No report of concerns or symptoms today Strength training completed today  Goals Unmet:  Not Applicable  Comments: checkout time is 0915   Dr. Jonathan Branch is Medical Director for [facility] 

## 2022-11-19 ENCOUNTER — Encounter (HOSPITAL_COMMUNITY)
Admission: RE | Admit: 2022-11-19 | Discharge: 2022-11-19 | Disposition: A | Discharge status: Home / Self Care | Payer: Medicare HMO | Source: Ambulatory Visit | Attending: Internal Medicine | Admitting: Internal Medicine

## 2022-11-19 ENCOUNTER — Telehealth: Payer: Self-pay | Admitting: Internal Medicine

## 2022-11-19 DIAGNOSIS — I5022 Chronic systolic (congestive) heart failure: Secondary | ICD-10-CM | POA: Diagnosis not present

## 2022-11-19 MED ORDER — FUROSEMIDE 40 MG PO TABS: 40.0000 mg | ORAL_TABLET | Freq: Every day | ORAL | 3 refills | Status: DC

## 2022-11-19 MED ORDER — EMPAGLIFLOZIN 10 MG PO TABS: 10.0000 mg | ORAL_TABLET | Freq: Every day | ORAL | 6 refills | Status: DC

## 2022-11-19 MED ORDER — APIXABAN 5 MG PO TABS: ORAL_TABLET | ORAL | 5 refills | Status: DC

## 2022-11-19 MED ORDER — SPIRONOLACTONE 25 MG PO TABS: 25.0000 mg | ORAL_TABLET | Freq: Every day | ORAL | 3 refills | Status: DC

## 2022-11-19 NOTE — Telephone Encounter (Signed)
Prescription refill request for Eliquis received. Indication: Rt DVT Last office visit: 11/03/22  V Mallipeddi MD Scr: 1.08 on 08/22/22 Age: 72 Weight: 65.3kg  Based on above findings Eliquis 5mg  twice daily is the appropriate dose.  Refill approved.

## 2022-11-19 NOTE — Telephone Encounter (Signed)
Pt called last week to have Rx's refilled, looks like she called pulmonary and they routed to PCP. She needs Eliquis, Jardiance, Spirnolactone, and Lasix. Pharmacy is [facility] [place]

## 2022-11-19 NOTE — Progress Notes (Signed)
Daily Session Note  Patient Details  Name: Summer Christensen MRN: [id number] Date of Birth: [birth year] Referring Provider:   Flowsheet Row CARDIAC REHAB PHASE II ORIENTATION from 10/07/2022 in [place]  Referring Provider Dr. Dellia Cloud       Encounter Date: 11/19/2022  Check In:  Session Check In - 11/19/22 0812       Check-In   Supervising physician immediately available to respond to emergencies [facility] MD immediately available    Physician(s) Dr. Dellia Cloud    Location [facility]    Staff Present Hoy Register MHA, MS, ACSM-CEP;Leana Roe, BS, Exercise Physiologist;Daphyne Hassell Done, RN, BSN    Virtual Visit No    Medication changes reported     No    Fall or balance concerns reported    No    Tobacco Cessation No Change    Warm-up and Cool-down Performed as group-led instruction    Resistance Training Performed Yes    VAD Patient? No    PAD/SET Patient? No      Pain Assessment   Currently in Pain? No/denies    Pain Score 0-No pain    Multiple Pain Sites No             Capillary Blood Glucose: No results found for this or any previous visit (from the past 24 hour(s)).    Social History   Tobacco Use  Smoking Status Never  Smokeless Tobacco Never    Goals Met:  Independence with exercise equipment Exercise tolerated well No report of concerns or symptoms today Strength training completed today  Goals Unmet:  Not Applicable  Comments: checkout time is 0915   Dr. Carlyle Dolly is Medical Director for [place]

## 2022-11-19 NOTE — Telephone Encounter (Signed)
Completed. Will route to Edrick Oh, RN for Eliquis refill.

## 2022-11-19 NOTE — Progress Notes (Signed)
Cardiac Individual Treatment Plan  Patient Details  Name: Summer Christensen MRN: [id number] Date of Birth: Jun 09, 1951 Referring Provider:   Flowsheet Row CARDIAC REHAB PHASE II ORIENTATION from 10/07/2022 in [facility] CARDIAC REHABILITATION  Referring Provider Dr. Jenene Slicker       Initial Encounter Date:  Flowsheet Row CARDIAC REHAB PHASE II ORIENTATION from 10/07/2022 in [place] CARDIAC REHABILITATION  Date 10/07/22       Visit Diagnosis: Heart failure, chronic systolic (HCC)  Patient's Home Medications on Admission:  Current Outpatient Medications:    apixaban (ELIQUIS) 5 MG TABS tablet, 2 po bid thru 10/31 then 1 po BID starting 08/20/22, Disp: 60 tablet, Rfl: 2   aspirin EC 81 MG tablet, Take 81 mg by mouth daily. Swallow whole., Disp: , Rfl:    carvedilol (COREG) 12.5 MG tablet, Take 1 tablet (12.5 mg total) by mouth 2 (two) times daily., Disp: 180 tablet, Rfl: 1   cefdinir (OMNICEF) 300 MG capsule, 1 capsule Orally twice a day for 10 days, Disp: , Rfl:    empagliflozin (JARDIANCE) 10 MG TABS tablet, Take 1 tablet (10 mg total) by mouth daily., Disp: 30 tablet, Rfl: 2   furosemide (LASIX) 40 MG tablet, Take 1 tablet (40 mg total) by mouth daily., Disp: 30 tablet, Rfl: 2   losartan (COZAAR) 50 MG tablet, Take 1 tablet (50 mg total) by mouth daily., Disp: 30 tablet, Rfl: 1   pantoprazole (PROTONIX) 40 MG tablet, Take 1 tablet (40 mg total) by mouth daily., Disp: 30 tablet, Rfl: 2   Saccharomyces boulardii (PROBIOTIC) 250 MG CAPS, Take 1 capsule by mouth daily., Disp: , Rfl:    spironolactone (ALDACTONE) 25 MG tablet, Take 1 tablet (25 mg total) by mouth daily., Disp: 90 tablet, Rfl: 1  Past Medical History: 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   Chest pain    Hx of hysterectomy    Hypertension    Migraine     Tobacco Use: Social History   Tobacco Use  Smoking Status Never  Smokeless Tobacco Never    Labs: Review Flowsheet        Latest Ref Rng & Units 07/29/2022 07/30/2022 07/31/2022 08/02/2022  Labs for ITP Cardiac and Pulmonary Rehab  Trlycerides <150 mg/dL - 546  - 568   Hemoglobin A1c 4.8 - 5.6 % 6.3  - - -  PH, Arterial 7.35 - 7.45 7.24  7.42  - -  PCO2 arterial 32 - 48 mmHg 53  35  - -  Bicarbonate 20.0 - 28.0 mmol/L 22.7  22.1  22.7  30.2  -  TCO2 22 - 32 mmol/L 24  - - -  Acid-base deficit 0.0 - 2.0 mmol/L 5.2  9.0  1.3  - -  O2 Saturation % 98.2  33.1  99  26.8  -    Capillary Blood Glucose: Lab Results  Component Value Date   GLUCAP 218 (H) 10/15/2022   GLUCAP 152 (H) 10/10/2022   GLUCAP 142 (H) 10/07/2022   GLUCAP 109 (H) 08/13/2022   GLUCAP 109 (H) 08/11/2022    POCT Glucose     Row Name 10/07/22 0831             POCT Blood Glucose   Pre-Exercise 142 mg/dL                Exercise Target Goals: Exercise Program Goal: Individual exercise prescription set using results from initial 6 min walk test and THRR  while considering  patient's activity barriers and safety.   Exercise Prescription Goal: Starting with aerobic activity 30 plus minutes a day, 3 days per week for initial exercise prescription. Provide home exercise prescription and guidelines that participant acknowledges understanding prior to discharge.  Activity Barriers & Risk Stratification:  Activity Barriers & Cardiac Risk Stratification - 10/07/22 0855       Activity Barriers & Cardiac Risk Stratification   Activity Barriers Back Problems;Joint Problems;Decreased Ventricular Function    Cardiac Risk Stratification High             6 Minute Walk:  6 Minute Walk     Row Name 10/07/22 0857         6 Minute Walk   Phase Initial     Distance 1400 feet     Walk Time 6 minutes     # of Rest Breaks 0     MPH 2.65     METS 2.99     RPE 11     VO2 Peak 10.48     Symptoms No     Resting HR 63 bpm     Resting BP 118/68     Resting Oxygen Saturation  97 %     Exercise Oxygen Saturation  during 6  min walk 97 %     Max Ex. HR 85 bpm     Max Ex. BP 136/68     2 Minute Post BP 122/68              Oxygen Initial Assessment:   Oxygen Re-Evaluation:   Oxygen Discharge (Final Oxygen Re-Evaluation):   Initial Exercise Prescription:  Initial Exercise Prescription - 10/07/22 0800       Date of Initial Exercise RX and Referring Provider   Date 10/07/22    Referring Provider Dr. Jenene SlickerMallipeddi    Expected Discharge Date 11/24/22      Treadmill   MPH 1.7    Grade 0    Minutes 17      Recumbant Elliptical   Level 1    RPM 60    Minutes 22      Prescription Details   Frequency (times per week) 3    Duration Progress to 30 minutes of continuous aerobic without signs/symptoms of physical distress      Intensity   THRR 40-80% of Max Heartrate 60-119    Ratings of Perceived Exertion 11-13      Resistance Training   Training Prescription Yes    Weight 3    Reps 10-15             Perform Capillary Blood Glucose checks as needed.  Exercise Prescription Changes:   Exercise Prescription Changes     Row Name 10/17/22 1200 11/17/22 1200           Response to Exercise   Blood Pressure (Admit) 118/64 120/64      Blood Pressure (Exercise) 122/60 124/70      Blood Pressure (Exit) 116/62 112/60      Heart Rate (Admit) 82 bpm 75 bpm      Heart Rate (Exercise) 111 bpm 97 bpm      Heart Rate (Exit) 91 bpm 84 bpm      Rating of Perceived Exertion (Exercise) 11 12      Perceived Dyspnea (Exercise) 11 --      Duration Continue with 30 min of aerobic exercise without signs/symptoms of physical distress. Continue with 30 min of aerobic exercise without signs/symptoms of  physical distress.      Intensity THRR unchanged THRR unchanged        Progression   Progression Continue to progress workloads to maintain intensity without signs/symptoms of physical distress. Continue to progress workloads to maintain intensity without signs/symptoms of physical distress.         Resistance Training   Training Prescription Yes Yes      Weight 3 4      Reps 10-15 10-15      Time 10 Minutes 10 Minutes        Treadmill   MPH 2 2.4      Grade 0.5 2.5      Minutes 17 17      METs 2.67 3.66        Recumbant Elliptical   Level 2 2      RPM 57 57      Minutes 22 22      METs 4.5 4.3               Exercise Comments:   Exercise Goals and Review:   Exercise Goals     Row Name 10/07/22 0902 10/21/22 1105 11/17/22 1240         Exercise Goals   Increase Physical Activity Yes Yes Yes     Intervention Provide advice, education, support and counseling about physical activity/exercise needs.;Develop an individualized exercise prescription for aerobic and resistive training based on initial evaluation findings, risk stratification, comorbidities and participant's personal goals. Provide advice, education, support and counseling about physical activity/exercise needs.;Develop an individualized exercise prescription for aerobic and resistive training based on initial evaluation findings, risk stratification, comorbidities and participant's personal goals. Provide advice, education, support and counseling about physical activity/exercise needs.;Develop an individualized exercise prescription for aerobic and resistive training based on initial evaluation findings, risk stratification, comorbidities and participant's personal goals.     Expected Outcomes Short Term: Attend rehab on a regular basis to increase amount of physical activity.;Long Term: Add in home exercise to make exercise part of routine and to increase amount of physical activity.;Long Term: Exercising regularly at least 3-5 days a week. Short Term: Attend rehab on a regular basis to increase amount of physical activity.;Long Term: Add in home exercise to make exercise part of routine and to increase amount of physical activity.;Long Term: Exercising regularly at least 3-5 days a week. Short Term: Attend rehab on  a regular basis to increase amount of physical activity.;Long Term: Add in home exercise to make exercise part of routine and to increase amount of physical activity.;Long Term: Exercising regularly at least 3-5 days a week.     Increase Strength and Stamina Yes Yes Yes     Intervention Provide advice, education, support and counseling about physical activity/exercise needs.;Develop an individualized exercise prescription for aerobic and resistive training based on initial evaluation findings, risk stratification, comorbidities and participant's personal goals. Provide advice, education, support and counseling about physical activity/exercise needs.;Develop an individualized exercise prescription for aerobic and resistive training based on initial evaluation findings, risk stratification, comorbidities and participant's personal goals. Provide advice, education, support and counseling about physical activity/exercise needs.;Develop an individualized exercise prescription for aerobic and resistive training based on initial evaluation findings, risk stratification, comorbidities and participant's personal goals.     Expected Outcomes Short Term: Increase workloads from initial exercise prescription for resistance, speed, and METs.;Short Term: Perform resistance training exercises routinely during rehab and add in resistance training at home;Long Term: Improve cardiorespiratory fitness, muscular endurance and strength as  measured by increased METs and functional capacity ( ) Short Term: Increase workloads from initial exercise prescription for resistance, speed, and METs.;Short Term: Perform resistance training exercises routinely during rehab and add in resistance training at home;Long Term: Improve cardiorespiratory fitness, muscular endurance and strength as measured by increased METs and functional capacity ( ) Short Term: Increase workloads from initial exercise prescription for resistance, speed, and  METs.;Short Term: Perform resistance training exercises routinely during rehab and add in resistance training at home;Long Term: Improve cardiorespiratory fitness, muscular endurance and strength as measured by increased METs and functional capacity ( )     Able to understand and use rate of perceived exertion (RPE) scale Yes Yes Yes     Intervention Provide education and explanation on how to use RPE scale Provide education and explanation on how to use RPE scale Provide education and explanation on how to use RPE scale     Expected Outcomes Short Term: Able to use RPE daily in rehab to express subjective intensity level;Long Term:  Able to use RPE to guide intensity level when exercising independently Short Term: Able to use RPE daily in rehab to express subjective intensity level;Long Term:  Able to use RPE to guide intensity level when exercising independently Short Term: Able to use RPE daily in rehab to express subjective intensity level;Long Term:  Able to use RPE to guide intensity level when exercising independently     Knowledge and understanding of Target Heart Rate Range (THRR) Yes Yes Yes     Intervention Provide education and explanation of THRR including how the numbers were predicted and where they are located for reference Provide education and explanation of THRR including how the numbers were predicted and where they are located for reference Provide education and explanation of THRR including how the numbers were predicted and where they are located for reference     Expected Outcomes Short Term: Able to use daily as guideline for intensity in rehab;Short Term: Able to state/look up THRR;Long Term: Able to use THRR to govern intensity when exercising independently -- Short Term: Able to use daily as guideline for intensity in rehab;Short Term: Able to state/look up THRR;Long Term: Able to use THRR to govern intensity when exercising independently     Able to check pulse independently  Yes Yes Yes     Intervention Provide education and demonstration on how to check pulse in carotid and radial arteries.;Review the importance of being able to check your own pulse for safety during independent exercise Provide education and demonstration on how to check pulse in carotid and radial arteries.;Review the importance of being able to check your own pulse for safety during independent exercise Provide education and demonstration on how to check pulse in carotid and radial arteries.;Review the importance of being able to check your own pulse for safety during independent exercise     Expected Outcomes Long Term: Able to check pulse independently and accurately;Short Term: Able to explain why pulse checking is important during independent exercise Long Term: Able to check pulse independently and accurately;Short Term: Able to explain why pulse checking is important during independent exercise Long Term: Able to check pulse independently and accurately;Short Term: Able to explain why pulse checking is important during independent exercise     Understanding of Exercise Prescription Yes Yes Yes     Intervention Provide education, explanation, and written materials on patient's individual exercise prescription Provide education, explanation, and written materials on patient's individual exercise prescription Provide education, explanation, and written materials  on patient's individual exercise prescription     Expected Outcomes Short Term: Able to explain program exercise prescription;Long Term: Able to explain home exercise prescription to exercise independently Short Term: Able to explain program exercise prescription;Long Term: Able to explain home exercise prescription to exercise independently Short Term: Able to explain program exercise prescription;Long Term: Able to explain home exercise prescription to exercise independently              Exercise Goals Re-Evaluation :  Exercise Goals  Re-Evaluation     Row Name 10/21/22 1105 11/17/22 1240           Exercise Goal Re-Evaluation   Exercise Goals Review Increase Physical Activity;Increase Strength and Stamina;Able to understand and use rate of perceived exertion (RPE) scale;Knowledge and understanding of Target Heart Rate Range (THRR);Able to check pulse independently;Understanding of Exercise Prescription Increase Physical Activity;Able to understand and use rate of perceived exertion (RPE) scale;Increase Strength and Stamina;Knowledge and understanding of Target Heart Rate Range (THRR);Able to check pulse independently;Understanding of Exercise Prescription      Comments Pt has completed 4 sessions of CR. She is egar to get started with rehab and is motivated during class. She has incrreased her speed on the treadmill and level on the Ellp. She is currently exercising at 4.5 METs on the ellp. Will continue to monitor and update when able. Pt has completed 9 sessions of CR. She is motivated during class and has started to increase her levels. She is increaseing her speed and grade on the treadmill and workloand on the ellp. She is curretnly exercising at 4.3 METs on the ellp. Will continue to monitor and progress as able.      Expected Outcomes Through exercise at rehab and home, the patient will meet their stated goals. Through exercise at rehab and home, the patient will meet their stated goals.                Discharge Exercise Prescription (Final Exercise Prescription Changes):  Exercise Prescription Changes - 11/17/22 1200       Response to Exercise   Blood Pressure (Admit) 120/64    Blood Pressure (Exercise) 124/70    Blood Pressure (Exit) 112/60    Heart Rate (Admit) 75 bpm    Heart Rate (Exercise) 97 bpm    Heart Rate (Exit) 84 bpm    Rating of Perceived Exertion (Exercise) 12    Duration Continue with 30 min of aerobic exercise without signs/symptoms of physical distress.    Intensity THRR unchanged       Progression   Progression Continue to progress workloads to maintain intensity without signs/symptoms of physical distress.      Resistance Training   Training Prescription Yes    Weight 4    Reps 10-15    Time 10 Minutes      Treadmill   MPH 2.4    Grade 2.5    Minutes 17    METs 3.66      Recumbant Elliptical   Level 2    RPM 57    Minutes 22    METs 4.3             Nutrition:  Target Goals: Understanding of nutrition guidelines, daily intake of sodium 1500mg , cholesterol 200mg , calories 30% from fat and 7% or less from saturated fats, daily to have 5 or more servings of fruits and vegetables.  Biometrics:  Pre Biometrics - 10/07/22 0903       Pre Biometrics  Height 5\' 4"  (1.626 m)    Weight 65.4 kg    Waist Circumference 34 inches    Hip Circumference 38 inches    Waist to Hip Ratio 0.89 %    BMI (Calculated) 24.74    Triceps Skinfold 25 mm    % Body Fat 36.8 %    Grip Strength 19.4 kg    Flexibility 13 in    Single Leg Stand 19.85 seconds              Nutrition Therapy Plan and Nutrition Goals:  Nutrition Therapy & Goals - 10/15/22 1000       Personal Nutrition Goals   Comments Patient scored 21 on her diet assessment. We have 2 educational sessions on heart healthy nutrition with handouts and assistance with RD referral if patient is interested.      Intervention Plan   Intervention Nutrition handout(s) given to patient.    Expected Outcomes Short Term Goal: Understand basic principles of dietary content, such as calories, fat, sodium, cholesterol and nutrients.             Nutrition Assessments:  Nutrition Assessments - 10/07/22 0858       MEDFICTS Scores   Pre Score 21            MEDIFICTS Score Key: ?70 Need to make dietary changes  40-70 Heart Healthy Diet ? 40 Therapeutic Level Cholesterol Diet   Picture Your Plate Scores: <34 Unhealthy dietary pattern with much room for improvement. 41-50 Dietary pattern unlikely  to meet recommendations for good health and room for improvement. 51-60 More healthful dietary pattern, with some room for improvement.  >60 Healthy dietary pattern, although there may be some specific behaviors that could be improved.    Nutrition Goals Re-Evaluation:   Nutrition Goals Discharge (Final Nutrition Goals Re-Evaluation):   Psychosocial: Target Goals: Acknowledge presence or absence of significant depression and/or stress, maximize coping skills, provide positive support system. Participant is able to verbalize types and ability to use techniques and skills needed for reducing stress and depression.  Initial Review & Psychosocial Screening:  Initial Psych Review & Screening - 10/07/22 0857       Initial Review   Current issues with Current Sleep Concerns      Family Dynamics   Good Support System? Yes    Comments Her support system includes her three children.      Barriers   Psychosocial barriers to participate in program There are no identifiable barriers or psychosocial needs.      Screening Interventions   Interventions Encouraged to exercise    Expected Outcomes Long Term goal: The participant improves quality of Life and PHQ9 Scores as seen by post scores and/or verbalization of changes;Short Term goal: Identification and review with participant of any Quality of Life or Depression concerns found by scoring the questionnaire.             Quality of Life Scores:  Quality of Life - 10/07/22 1146       Quality of Life   Select Quality of Life            Scores of 19 and below usually indicate a poorer quality of life in these areas.  A difference of  2-3 points is a clinically meaningful difference.  A difference of 2-3 points in the total score of the Quality of Life Index has been associated with significant improvement in overall quality of life, self-image, physical symptoms, and general health in  studies assessing change in quality of  life.  PHQ-9: Review Flowsheet       10/07/2022 11/20/2017  Depression screen PHQ 2/9  Decreased Interest 0 3 3  Down, Depressed, Hopeless 0 3 3  PHQ - 2 Score 0 6 6  Altered sleeping 1 3  Tired, decreased energy 0 0  Change in appetite 1 0  Feeling bad or failure about yourself  0 1  Trouble concentrating 0 0  Moving slowly or fidgety/restless 0 0  Suicidal thoughts 0 0  PHQ-9 Score 2 10  Difficult doing work/chores Not difficult at all Not difficult at all   Interpretation of Total Score  Total Score Depression Severity:  1-4 = Minimal depression, 5-9 = Mild depression, 10-14 = Moderate depression, 15-19 = Moderately severe depression, 20-27 = Severe depression   Psychosocial Evaluation and Intervention:  Psychosocial Evaluation - 10/07/22 0933       Psychosocial Evaluation & Interventions   Interventions Encouraged to exercise with the program and follow exercise prescription    Comments Pt has no barriers to participating in CR. She does report issues with her sleep, and she has no other identifiable psychosocial issues. She reports that she worked 32 years for [employer], and many of these years she worked nights. She thinks that this has permanently altered her sleep. She will sometimes take Advil PM to aid her sleep. She scored a 2 on her PHQ-9 and she relates this to her sleep and lack of appetite. She has still not regained her appetite since being in the hospital in October. She reports losing 15 lbs since her original admission. She reports having a good support system from her three children. Her goals while in the program are to return to her daily activites such as gardening. She only wants to do half of the program, but I told her to let us know if she changes her mind and wants to do the full program. She is eager to start the program.    Expected Outcomes Pt will continue to have no identifiable spsychosocial issues.    Continue Psychosocial Services  No Follow up  required             Psychosocial Re-Evaluation:  Psychosocial Re-Evaluation     Row Name 10/15/22 1001 11/10/22 1347           Psychosocial Re-Evaluation   Current issues with Current Sleep Concerns Current Sleep Concerns      Comments Patient is new to the program. She has completed 2 sessions. She continues to have no psychosocial barriers identified. She continues to use Advil PM for sleep. We will continue to monitor her progress. Patient has completed 5 sessions. She continues to have no psychosocial barriers identified. She has missed some sessions due to a sinus infection. She returned today. She seems to enjoy the program and demonstrates an interest in improving her health. She continues to use Advil PM for sleep. We will continue to monitor her progress.      Expected Outcomes Patient will continue to have no psychosocial barriers identified. Patient will continue to have no psychosocial barriers identified.      Interventions Relaxation education;Stress management education;Encouraged to attend Cardiac Rehabilitation for the exercise Relaxation education;Stress management education;Encouraged to attend Cardiac Rehabilitation for the exercise      Continue Psychosocial Services  No Follow up required No Follow up required               Psychosocial Discharge (  Final Psychosocial Re-Evaluation):  Psychosocial Re-Evaluation - 11/10/22 1347       Psychosocial Re-Evaluation   Current issues with Current Sleep Concerns    Comments Patient has completed 5 sessions. She continues to have no psychosocial barriers identified. She has missed some sessions due to a sinus infection. She returned today. She seems to enjoy the program and demonstrates an interest in improving her health. She continues to use Advil PM for sleep. We will continue to monitor her progress.    Expected Outcomes Patient will continue to have no psychosocial barriers identified.    Interventions Relaxation  education;Stress management education;Encouraged to attend Cardiac Rehabilitation for the exercise    Continue Psychosocial Services  No Follow up required             Vocational Rehabilitation: Provide vocational rehab assistance to qualifying candidates.   Vocational Rehab Evaluation & Intervention:  Vocational Rehab - 10/07/22 0907       Initial Vocational Rehab Evaluation & Intervention   Assessment shows need for Vocational Rehabilitation No      Vocational Rehab Re-Evaulation   Comments retired             Education: Education Goals: Education classes will be provided on a weekly basis, covering required topics. Participant will state understanding/return demonstration of topics presented.  Learning Barriers/Preferences:  Learning Barriers/Preferences - 10/07/22 0901       Learning Barriers/Preferences   Learning Barriers None    Learning Preferences Skilled Demonstration             Education Topics: Hypertension, Hypertension Reduction -Define heart disease and high blood pressure. Discus how high blood pressure affects the body and ways to reduce high blood pressure. Flowsheet Row CARDIAC REHAB PHASE II EXERCISE from 11/19/2022 in [place] CARDIAC REHABILITATION  Date 10/22/22  Educator DF  Instruction Review Code 2- Demonstrated Understanding       Exercise and Your Heart -Discuss why it is important to exercise, the FITT principles of exercise, normal and abnormal responses to exercise, and how to exercise safely.   Angina -Discuss definition of angina, causes of angina, treatment of angina, and how to decrease risk of having angina.   Cardiac Medications -Review what the following cardiac medications are used for, how they affect the body, and side effects that may occur when taking the medications.  Medications include Aspirin, Beta blockers, calcium channel blockers, ACE Inhibitors, angiotensin receptor blockers, diuretics, digoxin, and  antihyperlipidemics. Flowsheet Row CARDIAC REHAB PHASE II EXERCISE from 11/19/2022 in [place] CARDIAC REHABILITATION  Date 11/19/22  Educator DF  Instruction Review Code 2- Demonstrated Understanding       Congestive Heart Failure -Discuss the definition of CHF, how to live with CHF, the signs and symptoms of CHF, and how keep track of weight and sodium intake. Flowsheet Row CARDIAC REHAB PHASE II EXERCISE from 11/19/2022 in [place] CARDIAC REHABILITATION  Date 11/12/22  Educator HB  Instruction Review Code 1- Verbalizes Understanding       Heart Disease and Intimacy -Discus the effect sexual activity has on the heart, how changes occur during intimacy as we age, and safety during sexual activity.   Smoking Cessation / COPD -Discuss different methods to quit smoking, the health benefits of quitting smoking, and the definition of COPD.   Nutrition I: Fats -Discuss the types of cholesterol, what cholesterol does to the heart, and how cholesterol levels can be controlled.   Nutrition II: Labels -Discuss the different components of food  labels and how to read food label   Heart Parts/Heart Disease and PAD -Discuss the anatomy of the heart, the pathway of blood circulation through the heart, and these are affected by heart disease.   Stress I: Signs and Symptoms -Discuss the causes of stress, how stress may lead to anxiety and depression, and ways to limit stress.   Stress II: Relaxation -Discuss different types of relaxation techniques to limit stress. Flowsheet Row CARDIAC REHAB PHASE II EXERCISE from 11/19/2022 in [place]  Date 10/15/22  Educator DF  Instruction Review Code 2- Demonstrated Understanding       Warning Signs of Stroke / TIA -Discuss definition of a stroke, what the signs and symptoms are of a stroke, and how to identify when someone is having stroke.   Knowledge Questionnaire Score:  Knowledge Questionnaire Score -  10/07/22 0903       Knowledge Questionnaire Score   Pre Score 15/24             Core Components/Risk Factors/Patient Goals at Admission:  Personal Goals and Risk Factors at Admission - 10/07/22 0907       Core Components/Risk Factors/Patient Goals on Admission   Improve shortness of breath with ADL's Yes    Intervention Provide education, individualized exercise plan and daily activity instruction to help decrease symptoms of SOB with activities of daily living.    Expected Outcomes Short Term: Improve cardiorespiratory fitness to achieve a reduction of symptoms when performing ADLs;Long Term: Be able to perform more ADLs without symptoms or delay the onset of symptoms    Heart Failure Yes    Intervention Provide a combined exercise and nutrition program that is supplemented with education, support and counseling about heart failure. Directed toward relieving symptoms such as shortness of breath, decreased exercise tolerance, and extremity edema.    Expected Outcomes Improve functional capacity of life;Short term: Attendance in program 2-3 days a week with increased exercise capacity. Reported lower sodium intake. Reported increased fruit and vegetable intake. Reports medication compliance.;Short term: Daily weights obtained and reported for increase. Utilizing diuretic protocols set by physician.;Long term: Adoption of self-care skills and reduction of barriers for early signs and symptoms recognition and intervention leading to self-care maintenance.    Hypertension Yes    Intervention Provide education on lifestyle modifcations including regular physical activity/exercise, weight management, moderate sodium restriction and increased consumption of fresh fruit, vegetables, and low fat dairy, alcohol moderation, and smoking cessation.;Monitor prescription use compliance.    Expected Outcomes Short Term: Continued assessment and intervention until BP is < 140/44mm HG in hypertensive  participants. < 130/81mm HG in hypertensive participants with diabetes, heart failure or chronic kidney disease.;Long Term: Maintenance of blood pressure at goal levels.    Personal Goal Other Yes    Personal Goal Return to daily activites such as gardening.    Intervention Attend CR and begin a home exercise program.    Expected Outcomes Pt will meet stated goals.             Core Components/Risk Factors/Patient Goals Review:   Goals and Risk Factor Review     Row Name 10/15/22 1003 11/10/22 1348           Core Components/Risk Factors/Patient Goals Review   Personal Goals Review Improve shortness of breath with ADL's;Hypertension;Other;Heart Failure;Diabetes Improve shortness of breath with ADL's;Hypertension;Other;Heart Failure;Diabetes      Review Patient was referred to CR with chronic systolic HF. She has multiple risk factors for CAD  and is participating in the program for risk modification. She has completed 2 sessions. Her current weight is 144.3 lbs maintained from her orientation visit. Her blood pressure is well controlled. Her DM is managed with Jardiance. Her last A1C was 07/29/22 at 6.3%. Her personal goals for the program are to be able to return to her daily activities such as gardening. We will continue to monitor her progress as she works towards meeting these goals. Patient has completed 5 sessions. Her current weight is 145.6 lbs increased from last 30 day review. She is doing well in the program but her attendance has been inconsistent due to a sinus infection that has impeded her progress. She returned today stating she felt much better and her SOB had improved. Her blood pressure is well controlled. Her DM continues to be managed with Jardiance. Her last A1C was 07/29/22 at 6.3%. Her personal goals for the program are to be able to return to her daily activities such as gardening. We will continue to monitor her progress as she works towards meeting these goals.       Expected Outcomes Patient will complete the program meeting both personal and program goals. Patient will complete the program meeting both personal and program goals.               Core Components/Risk Factors/Patient Goals at Discharge (Final Review):   Goals and Risk Factor Review - 11/10/22 1348       Core Components/Risk Factors/Patient Goals Review   Personal Goals Review Improve shortness of breath with ADL's;Hypertension;Other;Heart Failure;Diabetes    Review Patient has completed 5 sessions. Her current weight is 145.6 lbs increased from last 30 day review. She is doing well in the program but her attendance has been inconsistent due to a sinus infection that has impeded her progress. She returned today stating she felt much better and her SOB had improved. Her blood pressure is well controlled. Her DM continues to be managed with Jardiance. Her last A1C was 07/29/22 at 6.3%. Her personal goals for the program are to be able to return to her daily activities such as gardening. We will continue to monitor her progress as she works towards meeting these goals.    Expected Outcomes Patient will complete the program meeting both personal and program goals.             ITP Comments:   Comments: ITP REVIEW Pt is making expected progress toward Cardiac Rehab goals after completing 5 sessions. Recommend continued exercise, life style modification, education, and increased stamina and strength.

## 2022-11-21 ENCOUNTER — Encounter (HOSPITAL_COMMUNITY)
Admission: RE | Admit: 2022-11-21 | Discharge: 2022-11-21 | Disposition: A | Discharge status: Home / Self Care | Payer: Medicare HMO | Source: Ambulatory Visit | Attending: Internal Medicine | Admitting: Internal Medicine

## 2022-11-21 DIAGNOSIS — I5022 Chronic systolic (congestive) heart failure: Secondary | ICD-10-CM | POA: Insufficient documentation

## 2022-11-21 NOTE — Progress Notes (Signed)
Daily Session Note  Patient Details  Name: AIDALY CORDNER MRN: [id number] Date of Birth: 05-26-1951 Referring Provider:   Flowsheet Row CARDIAC REHAB PHASE II ORIENTATION from 10/07/2022 in [place]  Referring Provider Dr. Dellia Cloud       Encounter Date: 11/21/2022  Check In:  Session Check In - 11/21/22 0807       Check-In   Supervising physician immediately available to respond to emergencies [facility] MD immediately available    Physician(s) Dr. Dellia Cloud    Location [facility]    Staff Present Hoy Register MHA, MS, ACSM-CEP;Madelyn Flavors, RN, BSN    Virtual Visit No    Medication changes reported     No    Fall or balance concerns reported    No    Tobacco Cessation No Change    Warm-up and Cool-down Performed as group-led instruction    Resistance Training Performed Yes    VAD Patient? No    PAD/SET Patient? No      Pain Assessment   Currently in Pain? No/denies    Pain Score 0-No pain    Multiple Pain Sites No             Capillary Blood Glucose: No results found for this or any previous visit (from the past 24 hour(s)).    Social History   Tobacco Use  Smoking Status Never  Smokeless Tobacco Never    Goals Met:  Independence with exercise equipment Exercise tolerated well No report of concerns or symptoms today Strength training completed today  Goals Unmet:  Not Applicable  Comments: Checkout at 0915.   Dr. Carlyle Dolly is Medical Director for [facility] Cardiac Rehab

## 2022-11-24 ENCOUNTER — Encounter (HOSPITAL_COMMUNITY)
Admission: RE | Admit: 2022-11-24 | Discharge: 2022-11-24 | Disposition: A | Discharge status: Home / Self Care | Payer: Medicare HMO | Source: Ambulatory Visit | Attending: Internal Medicine | Admitting: Internal Medicine

## 2022-11-24 DIAGNOSIS — I5022 Chronic systolic (congestive) heart failure: Secondary | ICD-10-CM | POA: Diagnosis not present

## 2022-11-24 NOTE — Progress Notes (Signed)
Daily Session Note  Patient Details  Name: Summer Christensen MRN: [id number] Date of Birth: 02-12-1951 Referring Provider:   Flowsheet Row CARDIAC REHAB PHASE II ORIENTATION from 10/07/2022 in [place]  Referring Provider Dr. Dellia Cloud       Encounter Date: 11/24/2022  Check In:  Session Check In - 11/24/22 0815       Check-In   Supervising physician immediately available to respond to emergencies [facility] MD immediately available    Physician(s) Dr. Harl Bowie    Location [facility]    Staff Present Hoy Register MHA, MS, ACSM-CEP;Madelyn Flavors, RN, BSN;Heather Mel Almond, BS, Exercise Physiologist    Virtual Visit No    Medication changes reported     No    Fall or balance concerns reported    No    Tobacco Cessation No Change    Warm-up and Cool-down Performed as group-led instruction    Resistance Training Performed Yes    VAD Patient? No    PAD/SET Patient? No      Pain Assessment   Currently in Pain? No/denies    Pain Score 0-No pain    Multiple Pain Sites No             Capillary Blood Glucose: No results found for this or any previous visit (from the past 24 hour(s)).    Social History   Tobacco Use  Smoking Status Never  Smokeless Tobacco Never    Goals Met:  Independence with exercise equipment Exercise tolerated well No report of concerns or symptoms today Strength training completed today  Goals Unmet:  Not Applicable  Comments: checkout time is 0915   Dr. Carlyle Dolly is Medical Director for [place]

## 2022-11-26 ENCOUNTER — Encounter (HOSPITAL_COMMUNITY)
Admission: RE | Admit: 2022-11-26 | Discharge: 2022-11-26 | Disposition: A | Discharge status: Home / Self Care | Payer: Medicare HMO | Source: Ambulatory Visit | Attending: Internal Medicine | Admitting: Internal Medicine

## 2022-11-26 DIAGNOSIS — I5022 Chronic systolic (congestive) heart failure: Secondary | ICD-10-CM | POA: Diagnosis not present

## 2022-11-26 NOTE — Progress Notes (Signed)
Daily Session Note  Patient Details  Name: Summer Christensen MRN: [id number] Date of Birth: [birth year] Referring Provider:   Flowsheet Row CARDIAC REHAB PHASE II ORIENTATION from 10/07/2022 in [employer]  Referring Provider Dr. Dellia Cloud       Encounter Date: 11/26/2022  Check In:  Session Check In - 11/26/22 0815       Check-In   Supervising physician immediately available to respond to emergencies [facility] MD immediately available    Physician(s) Dr. Harl Bowie    Location [facility]    Staff Present Hoy Register MHA, MS, ACSM-CEP;[employer] BSN, RN;Heather Mel Almond, [place], Exercise Physiologist    Virtual Visit No    Medication changes reported     No    Fall or balance concerns reported    No    Tobacco Cessation No Change    Warm-up and Cool-down Performed as group-led instruction    Resistance Training Performed Yes    VAD Patient? No    PAD/SET Patient? No      Pain Assessment   Currently in Pain? No/denies    Pain Score 0-No pain    Multiple Pain Sites No             Capillary Blood Glucose: No results found for this or any previous visit (from the past 24 hour(s)).    Social History   Tobacco Use  Smoking Status Never  Smokeless Tobacco Never    Goals Met:  Independence with exercise equipment Exercise tolerated well No report of concerns or symptoms today Strength training completed today  Goals Unmet:  Not Applicable  Comments: checkout time is 0915   Dr. Carlyle Dolly is Medical Director for [place]

## 2022-11-28 ENCOUNTER — Encounter (HOSPITAL_COMMUNITY)
Admission: RE | Admit: 2022-11-28 | Discharge: 2022-11-28 | Disposition: A | Discharge status: Home / Self Care | Payer: Medicare HMO | Source: Ambulatory Visit | Attending: Internal Medicine | Admitting: Internal Medicine

## 2022-11-28 DIAGNOSIS — I5022 Chronic systolic (congestive) heart failure: Secondary | ICD-10-CM

## 2022-11-28 NOTE — Progress Notes (Signed)
I have reviewed a Home Exercise Prescription with Summer Christensen . Summer Christensen is not currently exercising at home but she is planning to walk her land. The patient was advised to walk 3 days a week for 30-45 minutes.  Summer Christensen and I discussed how to progress their exercise prescription.  The patient stated that their goals were to maintain exercising and keep healthy.  The patient stated that they understand the exercise prescription.  We reviewed exercise guidelines, target heart rate during exercise, RPE Scale, weather conditions, NTG use, endpoints for exercise, warmup and cool down.  Patient is encouraged to come to me with any questions. I will continue to follow up with the patient to assist them with progression and safety.

## 2022-11-28 NOTE — Progress Notes (Signed)
Daily Session Note  Patient Details  Name: Summer Christensen MRN: [id number] Date of Birth: Jul 10, 1951 Referring Provider:   Flowsheet Row CARDIAC REHAB PHASE II ORIENTATION from 10/07/2022 in [place]  Referring Provider Dr. Dellia Cloud       Encounter Date: 11/28/2022  Check In:  Session Check In - 11/28/22 0815       Check-In   Supervising physician immediately available to respond to emergencies [facility] MD immediately available    Physician(s) Dr. Harl Bowie    Location [facility]    Staff Present Hoy Register MHA, MS, ACSM-CEP;Leana Roe, BS, Exercise Physiologist;Ronan Duecker, RN;Debra Wynetta Emery, RN, Joanette Gula, RN, BSN    Virtual Visit No    Medication changes reported     No    Fall or balance concerns reported    No    Tobacco Cessation No Change    Warm-up and Cool-down Performed as group-led instruction    Resistance Training Performed Yes    VAD Patient? No    PAD/SET Patient? No      Pain Assessment   Currently in Pain? No/denies    Pain Score 0-No pain    Multiple Pain Sites No             Capillary Blood Glucose: No results found for this or any previous visit (from the past 24 hour(s)).    Social History   Tobacco Use  Smoking Status Never  Smokeless Tobacco Never    Goals Met:  Independence with exercise equipment Exercise tolerated well Strength training completed today  Goals Unmet:  Not Applicable  Comments: check out @ 9:15am   Dr. Carlyle Dolly is Medical Director for [facility]

## 2022-12-01 ENCOUNTER — Telehealth: Payer: Self-pay | Admitting: Internal Medicine

## 2022-12-01 ENCOUNTER — Encounter (HOSPITAL_COMMUNITY)
Admission: RE | Admit: 2022-12-01 | Discharge: 2022-12-01 | Disposition: A | Discharge status: Home / Self Care | Payer: Medicare HMO | Source: Ambulatory Visit | Attending: Internal Medicine | Admitting: Internal Medicine

## 2022-12-01 VITALS — Wt 144.6 lb

## 2022-12-01 DIAGNOSIS — I5022 Chronic systolic (congestive) heart failure: Secondary | ICD-10-CM | POA: Diagnosis not present

## 2022-12-01 NOTE — Progress Notes (Signed)
Daily Session Note  Patient Details  Name: Summer Christensen MRN: [id number] Date of Birth: Jan 31, 1951 Referring Provider:   Flowsheet Row CARDIAC REHAB PHASE II ORIENTATION from 10/07/2022 in [place]  Referring Provider Dr. Dellia Cloud       Encounter Date: 12/01/2022  Check In:  Session Check In - 12/01/22 0813       Check-In   Supervising physician immediately available to respond to emergencies [facility] MD immediately available    Physician(s) Dr.McDowell    Location [facility]    Staff Present Leana Roe, BS, Exercise Physiologist;Dalton Sherrie George, MS, ACSM-CEP;Geanie Cooley, RN;Debra Johnson, RN, BSN    Virtual Visit No    Medication changes reported     No    Fall or balance concerns reported    No    Tobacco Cessation No Change    Warm-up and Cool-down Performed as group-led Higher education careers adviser Performed Yes    VAD Patient? No    PAD/SET Patient? No      Pain Assessment   Currently in Pain? No/denies    Pain Score 0-No pain    Multiple Pain Sites No             Capillary Blood Glucose: No results found for this or any previous visit (from the past 24 hour(s)).    Social History   Tobacco Use  Smoking Status Never  Smokeless Tobacco Never    Goals Met:  Independence with exercise equipment Exercise tolerated well No report of concerns or symptoms today  Goals Unmet:  Not Applicable  Comments: check out @ 9:15am   Dr. Carlyle Dolly is Medical Director for [place]

## 2022-12-01 NOTE — Telephone Encounter (Signed)
Patient notified and verbalized understanding. Medication added to patients allergy list.

## 2022-12-01 NOTE — Telephone Encounter (Signed)
Patient notified and verbalized understanding. Patient stated she went to urgent care Friday and was prescribed antibiotics. Patient will stop medication.

## 2022-12-01 NOTE — Telephone Encounter (Signed)
Pt c/o medication issue:  1. Name of Medication: empagliflozin (JARDIANCE) 10 MG TABS tablet   2. How are you currently taking this medication (dosage and times per day)? 1 tablet daily  3. Are you having a reaction (difficulty breathing--STAT)? no  4. What is your medication issue? Patient states she has an infection since starting the medication. She says her butt is numb and her privates are very red and it hurts when she wipes herself.

## 2022-12-03 ENCOUNTER — Encounter (HOSPITAL_COMMUNITY)
Admission: RE | Admit: 2022-12-03 | Discharge: 2022-12-03 | Disposition: A | Discharge status: Home / Self Care | Payer: Medicare HMO | Source: Ambulatory Visit | Attending: Internal Medicine | Admitting: Internal Medicine

## 2022-12-03 DIAGNOSIS — I5022 Chronic systolic (congestive) heart failure: Secondary | ICD-10-CM | POA: Diagnosis not present

## 2022-12-03 NOTE — Progress Notes (Signed)
Daily Session Note  Patient Details  Name: Summer Christensen MRN: [id number] Date of Birth: 1951/01/21 Referring Provider:   Flowsheet Row CARDIAC REHAB PHASE II ORIENTATION from 10/07/2022 in [place]  Referring Provider Dr. Dellia Cloud       Encounter Date: 12/03/2022  Check In:  Session Check In - 12/03/22 0815       Check-In   Supervising physician immediately available to respond to emergencies [facility] MD immediately available    Physician(s) Dr.McDowell    Location [facility]    Staff Present Hoy Register MHA, MS, ACSM-CEP;Melven Sartorius BSN, RN    Virtual Visit No    Medication changes reported     No    Fall or balance concerns reported    No    Tobacco Cessation No Change    Warm-up and Cool-down Performed as group-led Higher education careers adviser Performed Yes    VAD Patient? No    PAD/SET Patient? No      Pain Assessment   Currently in Pain? No/denies    Pain Score 0-No pain    Multiple Pain Sites No             Capillary Blood Glucose: No results found for this or any previous visit (from the past 24 hour(s)).    Social History   Tobacco Use  Smoking Status Never  Smokeless Tobacco Never    Goals Met:  Independence with exercise equipment Exercise tolerated well No report of concerns or symptoms today Strength training completed today  Goals Unmet:  Not Applicable  Comments: checkout time is 0915   Dr. Carlyle Dolly is Medical Director for [place]

## 2022-12-05 ENCOUNTER — Encounter (HOSPITAL_COMMUNITY): Payer: Medicare HMO

## 2022-12-08 ENCOUNTER — Encounter (HOSPITAL_COMMUNITY): Payer: Medicare HMO

## 2022-12-18 NOTE — Progress Notes (Signed)
Discharge Progress Report  Patient Details  Name: Summer Christensen MRN: [id number] Date of Birth: 07/05/1951 Referring Provider:   Flowsheet Row CARDIAC REHAB PHASE II ORIENTATION from 10/07/2022 in [place]  Referring Provider Dr. Dellia Cloud        Number of Visits: 16  Reason for Discharge:  Early Exit:  not interested in continuing   Smoking History:  Social History   Tobacco Use  Smoking Status Never  Smokeless Tobacco Never    Diagnosis:  Heart failure, chronic systolic ([facility])  ADL [facility]:   Initial Exercise Prescription:  Initial Exercise Prescription - 10/07/22 0800       Date of Initial Exercise RX and Referring Provider   Date 10/07/22    Referring Provider Dr. Dellia Cloud    Expected Discharge Date 11/24/22      Treadmill   MPH 1.7    Grade 0    Minutes 17      Recumbant Elliptical   Level 1    RPM 60    Minutes 22      Prescription Details   Frequency (times per week) 3    Duration Progress to 30 minutes of continuous aerobic without signs/symptoms of physical distress      Intensity   THRR 40-80% of Max Heartrate 60-119    Ratings of Perceived Exertion 11-13      Resistance Training   Training Prescription Yes    Weight 3    Reps 10-15             Discharge Exercise Prescription (Final Exercise Prescription Changes):  Exercise Prescription Changes - 12/01/22 0900       Response to Exercise   Blood Pressure (Admit) 102/52    Blood Pressure (Exercise) 108/58    Blood Pressure (Exit) 104/52    Heart Rate (Admit) 84 bpm    Heart Rate (Exercise) 114 bpm    Heart Rate (Exit) 93 bpm    Rating of Perceived Exertion (Exercise) 12    Duration Continue with 30 min of aerobic exercise without signs/symptoms of physical distress.    Intensity THRR unchanged      Progression   Progression Continue to progress workloads to maintain intensity without signs/symptoms of physical distress.      Resistance Training    Training Prescription Yes    Weight 5    Reps 10-15    Time 10 Minutes      Treadmill   MPH 3    Grade 2.5    Minutes 17    METs 4.33      Recumbant Elliptical   Level 3    RPM 49    Minutes 22    METs 4.6             Functional Capacity:  6 Minute Walk     Row Name 10/07/22 0857         6 Minute Walk   Phase Initial     Distance 1400 feet     Walk Time 6 minutes     # of Rest Breaks 0     MPH 2.65     METS 2.99     RPE 11     VO2 Peak 10.48     Symptoms No     Resting HR 63 bpm     Resting BP 118/68     Resting Oxygen Saturation  97 %     Exercise Oxygen Saturation  during 6 min walk 97 %  Max Ex. HR 85 bpm     Max Ex. BP 136/68     2 Minute Post BP 122/68              Psychological, QOL, Others - Outcomes: PHQ 2/9:    10/07/2022    8:53 AM 11/20/2017    9:20 AM 11/20/2017    9:19 AM  Depression screen PHQ 2/9  Decreased Interest 0 3 3  Down, Depressed, Hopeless 0 3 3  PHQ - 2 Score 0 6 6  Altered sleeping 1 3   Tired, decreased energy 0 0   Change in appetite 1 0   Feeling bad or failure about yourself  0 1   Trouble concentrating 0 0   Moving slowly or fidgety/restless 0 0   Suicidal thoughts 0 0   PHQ-9 Score 2 10   Difficult doing work/chores Not difficult at all Not difficult at all     Quality of Life:  Quality of Life - 10/07/22 1146       Quality of Life   Select Quality of Life             Personal Goals: Goals established at orientation with interventions provided to work toward goal.  Personal Goals and Risk Factors at Admission - 10/07/22 0907       Core Components/Risk Factors/Patient Goals on Admission   Improve shortness of breath with ADL's Yes    Intervention Provide education, individualized exercise plan and daily activity instruction to help decrease symptoms of SOB with activities of daily living.    Expected Outcomes Short Term: Improve cardiorespiratory fitness to achieve a reduction of symptoms  when performing ADLs;Long Term: Be able to perform more ADLs without symptoms or delay the onset of symptoms    Heart Failure Yes    Intervention Provide a combined exercise and nutrition program that is supplemented with education, support and counseling about heart failure. Directed toward relieving symptoms such as shortness of breath, decreased exercise tolerance, and extremity edema.    Expected Outcomes Improve functional capacity of life;Short term: Attendance in program 2-3 days a week with increased exercise capacity. Reported lower sodium intake. Reported increased fruit and vegetable intake. Reports medication compliance.;Short term: Daily weights obtained and reported for increase. Utilizing diuretic protocols set by physician.;Long term: Adoption of self-care skills and reduction of barriers for early signs and symptoms recognition and intervention leading to self-care maintenance.    Hypertension Yes    Intervention Provide education on lifestyle modifcations including regular physical activity/exercise, weight management, moderate sodium restriction and increased consumption of fresh fruit, vegetables, and low fat dairy, alcohol moderation, and smoking cessation.;Monitor prescription use compliance.    Expected Outcomes Short Term: Continued assessment and intervention until BP is < 140/59m HG in hypertensive participants. < 130/85mHG in hypertensive participants with diabetes, heart failure or chronic kidney disease.;Long Term: Maintenance of blood pressure at goal levels.    Personal Goal Other Yes    Personal Goal Return to daily activites such as gardening.    Intervention Attend CR and begin a home exercise program.    Expected Outcomes Pt will meet stated goals.              Personal Goals Discharge:  Goals and Risk Factor Review     Row Name 10/15/22 1003 11/10/22 1348           Core Components/Risk Factors/Patient Goals Review   Personal Goals Review Improve  shortness of breath with ADL's;Hypertension;Other;Heart  Failure;Diabetes Improve shortness of breath with ADL's;Hypertension;Other;Heart Failure;Diabetes      Review Patient was referred to CR with chronic systolic HF. She has multiple risk factors for CAD and is participating in the program for risk modification. She has completed 2 sessions. Her current weight is 144.3 lbs maintained from her orientation visit. Her blood pressure is well controlled. Her DM is managed with [facility]. Her last A1C was 07/29/22 at 6.3%. Her personal goals for the program are to be able to return to her daily activities such as gardening. We will continue to monitor her progress as she works towards meeting these goals. Patient has completed 5 sessions. Her current weight is 145.6 lbs increased from last 30 day review. She is doing well in the program but her attendance has been inconsistent due to a sinus infection that has impeded her progress. She returned today stating she felt much better and her SOB had improved. Her blood pressure is well controlled. Her DM continues to be managed with [facility]. Her last A1C was 07/29/22 at 6.3%. Her personal goals for the program are to be able to return to her daily activities such as gardening. We will continue to monitor her progress as she works towards meeting these goals.      Expected Outcomes Patient will complete the program meeting both personal and program goals. Patient will complete the program meeting both personal and program goals.               Exercise Goals and Review:  Exercise Goals     Row Name 10/07/22 0902 10/21/22 1105 11/17/22 1240         Exercise Goals   Increase Physical Activity Yes Yes Yes     Intervention Provide advice, education, support and counseling about physical activity/exercise needs.;Develop an individualized exercise prescription for aerobic and resistive training based on initial evaluation findings, risk stratification,  comorbidities and participant's personal goals. Provide advice, education, support and counseling about physical activity/exercise needs.;Develop an individualized exercise prescription for aerobic and resistive training based on initial evaluation findings, risk stratification, comorbidities and participant's personal goals. Provide advice, education, support and counseling about physical activity/exercise needs.;Develop an individualized exercise prescription for aerobic and resistive training based on initial evaluation findings, risk stratification, comorbidities and participant's personal goals.     Expected Outcomes Short Term: Attend rehab on a regular basis to increase amount of physical activity.;Long Term: Add in home exercise to make exercise part of routine and to increase amount of physical activity.;Long Term: Exercising regularly at least 3-5 days a week. Short Term: Attend rehab on a regular basis to increase amount of physical activity.;Long Term: Add in home exercise to make exercise part of routine and to increase amount of physical activity.;Long Term: Exercising regularly at least 3-5 days a week. Short Term: Attend rehab on a regular basis to increase amount of physical activity.;Long Term: Add in home exercise to make exercise part of routine and to increase amount of physical activity.;Long Term: Exercising regularly at least 3-5 days a week.     Increase Strength and Stamina Yes Yes Yes     Intervention Provide advice, education, support and counseling about physical activity/exercise needs.;Develop an individualized exercise prescription for aerobic and resistive training based on initial evaluation findings, risk stratification, comorbidities and participant's personal goals. Provide advice, education, support and counseling about physical activity/exercise needs.;Develop an individualized exercise prescription for aerobic and resistive training based on initial evaluation findings,  risk stratification, comorbidities and participant's personal  goals. Provide advice, education, support and counseling about physical activity/exercise needs.;Develop an individualized exercise prescription for aerobic and resistive training based on initial evaluation findings, risk stratification, comorbidities and participant's personal goals.     Expected Outcomes Short Term: Increase workloads from initial exercise prescription for resistance, speed, and METs.;Short Term: Perform resistance training exercises routinely during rehab and add in resistance training at home;Long Term: Improve cardiorespiratory fitness, muscular endurance and strength as measured by increased METs and functional capacity (6MWT) Short Term: Increase workloads from initial exercise prescription for resistance, speed, and METs.;Short Term: Perform resistance training exercises routinely during rehab and add in resistance training at home;Long Term: Improve cardiorespiratory fitness, muscular endurance and strength as measured by increased METs and functional capacity (6MWT) Short Term: Increase workloads from initial exercise prescription for resistance, speed, and METs.;Short Term: Perform resistance training exercises routinely during rehab and add in resistance training at home;Long Term: Improve cardiorespiratory fitness, muscular endurance and strength as measured by increased METs and functional capacity (6MWT)     Able to understand and use rate of perceived exertion (RPE) scale Yes Yes Yes     Intervention Provide education and explanation on how to use RPE scale Provide education and explanation on how to use RPE scale Provide education and explanation on how to use RPE scale     Expected Outcomes Short Term: Able to use RPE daily in rehab to express subjective intensity level;Long Term:  Able to use RPE to guide intensity level when exercising independently Short Term: Able to use RPE daily in rehab to express subjective  intensity level;Long Term:  Able to use RPE to guide intensity level when exercising independently Short Term: Able to use RPE daily in rehab to express subjective intensity level;Long Term:  Able to use RPE to guide intensity level when exercising independently     Knowledge and understanding of Target Heart Rate Range (THRR) Yes Yes Yes     Intervention Provide education and explanation of THRR including how the numbers were predicted and where they are located for reference Provide education and explanation of THRR including how the numbers were predicted and where they are located for reference Provide education and explanation of THRR including how the numbers were predicted and where they are located for reference     Expected Outcomes Short Term: Able to use daily as guideline for intensity in rehab;Short Term: Able to state/look up THRR;Long Term: Able to use THRR to govern intensity when exercising independently -- Short Term: Able to use daily as guideline for intensity in rehab;Short Term: Able to state/look up THRR;Long Term: Able to use THRR to govern intensity when exercising independently     Able to check pulse independently Yes Yes Yes     Intervention Provide education and demonstration on how to check pulse in carotid and radial arteries.;Review the importance of being able to check your own pulse for safety during independent exercise Provide education and demonstration on how to check pulse in carotid and radial arteries.;Review the importance of being able to check your own pulse for safety during independent exercise Provide education and demonstration on how to check pulse in carotid and radial arteries.;Review the importance of being able to check your own pulse for safety during independent exercise     Expected Outcomes Long Term: Able to check pulse independently and accurately;Short Term: Able to explain why pulse checking is important during independent exercise Long Term: Able to  check pulse independently and accurately;Short Term: Able  to explain why pulse checking is important during independent exercise Long Term: Able to check pulse independently and accurately;Short Term: Able to explain why pulse checking is important during independent exercise     Understanding of Exercise Prescription Yes Yes Yes     Intervention Provide education, explanation, and written materials on patient's individual exercise prescription Provide education, explanation, and written materials on patient's individual exercise prescription Provide education, explanation, and written materials on patient's individual exercise prescription     Expected Outcomes Short Term: Able to explain program exercise prescription;Long Term: Able to explain home exercise prescription to exercise independently Short Term: Able to explain program exercise prescription;Long Term: Able to explain home exercise prescription to exercise independently Short Term: Able to explain program exercise prescription;Long Term: Able to explain home exercise prescription to exercise independently              Exercise Goals Re-Evaluation:  Exercise Goals Re-Evaluation     Row Name 10/21/22 1105 11/17/22 1240           Exercise Goal Re-Evaluation   Exercise Goals Review Increase Physical Activity;Increase Strength and Stamina;Able to understand and use rate of perceived exertion (RPE) scale;Knowledge and understanding of Target Heart Rate Range (THRR);Able to check pulse independently;Understanding of Exercise Prescription Increase Physical Activity;Able to understand and use rate of perceived exertion (RPE) scale;Increase Strength and Stamina;Knowledge and understanding of Target Heart Rate Range (THRR);Able to check pulse independently;Understanding of Exercise Prescription      Comments Pt has completed 4 sessions of CR. She is egar to get started with rehab and is motivated during class. She has incrreased her speed on  the treadmill and level on the Ellp. She is currently exercising at 4.5 METs on the ellp. Will continue to monitor and update when able. Pt has completed 9 sessions of CR. She is motivated during class and has started to increase her levels. She is increaseing her speed and grade on the treadmill and workloand on the ellp. She is curretnly exercising at 4.3 METs on the ellp. Will continue to monitor and progress as able.      Expected Outcomes Through exercise at rehab and home, the patient will meet their stated goals. Through exercise at rehab and home, the patient will meet their stated goals.               Nutrition & Weight - Outcomes:  Pre Biometrics - 10/07/22 [id number]       Pre Biometrics   Height '5\' 4"'$  (1.626 m)    Weight 144 lb 2.9 oz (65.4 kg)    Waist Circumference 34 inches    Hip Circumference 38 inches    Waist to Hip Ratio 0.89 %    BMI (Calculated) 24.74    Triceps Skinfold 25 mm    % Body Fat 36.8 %    Grip Strength 19.4 kg    Flexibility 13 in    Single Leg Stand 19.85 seconds              Nutrition:  Nutrition Therapy & Goals - 10/15/22 1000       Personal Nutrition Goals   Comments Patient scored 21 on her diet assessment. We have 2 educational sessions on heart healthy nutrition with handouts and assistance with RD referral if patient is interested.      Intervention Plan   Intervention Nutrition handout(s) given to patient.    Expected Outcomes Short Term Goal: Understand basic principles of dietary content, such as  calories, fat, sodium, cholesterol and nutrients.             Nutrition Discharge:  Nutrition Assessments - 10/07/22 0858       MEDFICTS Scores   Pre Score 21             Education Questionnaire Score:  Knowledge Questionnaire Score - 10/07/22 0903       Knowledge Questionnaire Score   Pre Score 15/24             Pt discharged from CR after 16 sessions. Pt was not interested in finishing the whole program. She  did not completed half of the program (18 sessions), so we did not completed a discharge walk test.

## 2022-12-29 ENCOUNTER — Other Ambulatory Visit (HOSPITAL_COMMUNITY): Payer: Self-pay | Admitting: Emergency Medicine

## 2022-12-29 DIAGNOSIS — Z1231 Encounter for screening mammogram for malignant neoplasm of breast: Secondary | ICD-10-CM

## 2023-01-09 ENCOUNTER — Ambulatory Visit (HOSPITAL_COMMUNITY)
Admission: RE | Admit: 2023-01-09 | Discharge: 2023-01-09 | Disposition: A | Discharge status: Home / Self Care | Payer: Medicare HMO | Source: Ambulatory Visit | Attending: Emergency Medicine | Admitting: Emergency Medicine

## 2023-01-09 DIAGNOSIS — Z1231 Encounter for screening mammogram for malignant neoplasm of breast: Secondary | ICD-10-CM | POA: Diagnosis present

## 2023-01-20 ENCOUNTER — Other Ambulatory Visit: Payer: Self-pay

## 2023-01-20 MED ORDER — LOSARTAN POTASSIUM 50 MG PO TABS: 50.0000 mg | ORAL_TABLET | Freq: Every day | ORAL | 3 refills | Status: DC

## 2023-02-20 ENCOUNTER — Ambulatory Visit (HOSPITAL_COMMUNITY)
Admission: RE | Admit: 2023-02-20 | Discharge: 2023-02-20 | Disposition: A | Discharge status: Home / Self Care | Payer: Medicare HMO | Source: Ambulatory Visit | Attending: Internal Medicine | Admitting: Internal Medicine

## 2023-02-20 ENCOUNTER — Telehealth: Payer: Self-pay

## 2023-02-20 DIAGNOSIS — I509 Heart failure, unspecified: Secondary | ICD-10-CM | POA: Insufficient documentation

## 2023-02-20 DIAGNOSIS — I5022 Chronic systolic (congestive) heart failure: Secondary | ICD-10-CM

## 2023-02-20 LAB — ECHOCARDIOGRAM COMPLETE
MV M vel: 4.72 m/s
MV Peak grad: 89.1 mmHg
MV VTI: 2.51 cm2
Radius: 0.35 cm
S' Lateral: 5.5 cm

## 2023-02-20 NOTE — Telephone Encounter (Signed)
Echo results discussed with patient. Referral placed with [facility]  Results copied to pcp

## 2023-02-20 NOTE — Progress Notes (Signed)
  Echocardiogram 2D Echocardiogram has been performed.  Summer Christensen 02/20/2023, 2:12 PM

## 2023-02-20 NOTE — Telephone Encounter (Signed)
-----   Message from Marjo Bicker, MD sent at 02/20/2023  3:47 PM EDT ----- Heart pumping function continues to be low, 20 to 25% (> 55% is normal) despite being on GDMT. Place referral to [facility] failure [facility]. I will talk to her about ICD in the next clinic visit and refer to EP at that time.

## 2023-03-04 ENCOUNTER — Ambulatory Visit: Payer: 59 | Admitting: Internal Medicine

## 2023-03-25 ENCOUNTER — Encounter: Payer: Self-pay | Admitting: Internal Medicine

## 2023-03-25 ENCOUNTER — Ambulatory Visit: Payer: Medicare HMO | Attending: Internal Medicine | Admitting: Internal Medicine

## 2023-03-25 VITALS — BP 144/70 | HR 81 | Ht 64.0 in | Wt 148.8 lb

## 2023-03-25 DIAGNOSIS — I5022 Chronic systolic (congestive) heart failure: Secondary | ICD-10-CM | POA: Diagnosis not present

## 2023-03-25 NOTE — Patient Instructions (Signed)
Medication Instructions:  Your physician recommends that you continue on your current medications as directed. Please refer to the Current Medication list given to you today.  *If you need a refill on your cardiac medications before your next appointment, please call your pharmacy*   Lab Work: None If you have labs (blood work) drawn today and your tests are completely normal, you will receive your results only by: MyChart Message (if you have MyChart) OR A paper copy in the mail If you have any lab test that is abnormal or we need to change your treatment, we will call you to review the results.   Testing/Procedures: None   Follow-Up: At [facility], you and your health needs are our priority.  As part of our continuing mission to provide you with exceptional heart care, we have created designated Provider Care Teams.  These Care Teams include your primary Cardiologist (physician) and Advanced Practice Providers (APPs -  Physician Assistants and Nurse Practitioners) who all work together to provide you with the care you need, when you need it.  We recommend signing up for the patient portal called "MyChart".  Sign up information is provided on this After Visit Summary.  MyChart is used to connect with patients for Virtual Visits (Telemedicine).  Patients are able to view lab/test results, encounter notes, upcoming appointments, etc.  Non-urgent messages can be sent to your provider as well.   To learn more about what you can do with MyChart, go to [email or web address].    Your next appointment:   6 month(s)  Provider:   Luane School, MD    Other Instructions You have been referred to [facility] Clinic. They will call you with your first appointment.  You have been referred to EP. They will call you with your first appointment.

## 2023-03-30 NOTE — Progress Notes (Signed)
Cardiology Office Note  Date: 03/30/2023   ID: Summer Christensen, Summer Christensen Jul 26, 1951, MRN [id number]  PCP:  Smith Robert, MD  Cardiologist:  Marjo Bicker, MD Electrophysiologist:  None   Reason for Office Visit: Follow-up of NICM   History of Present Illness: Summer Christensen is a 72 y.o. 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  Patient was diagnosed with new onset cardiomyopathy with LVEF less than 20% when she was admitted to [facility] in 07/2022 with severe sepsis secondary to pneumonia.  She was readmitted again in 2 weeks and was diagnosed with LUL pulmonary embolus and acute DVT of her right leg for which she is currently on Eliquis.  CTA cardiac showed coronary calcium score of 53.8 and mild calcified plaque in the ostial LAD. Patient was on GDMT for 3 months after which repeat echocardiogram continue to show LVEF 20 to 25%.  Entresto was switched to losartan due to higher co-pay and off Jardiance due to and tenderness in the perineal area. Patient is here for follow-up visit.  She continues to have fatigue, has baseline SOB but no DOE.  No palpitations, leg swelling, syncope, dizziness.  No angina. She is compliant with her medications and has no side effects. She denies smoking cigarettes, illicit drug abuse or alcohol use.  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   ROTATOR CUFF REPAIR  2009   arthroscopic   TUBAL LIGATION     bilateral    Current Outpatient Medications  Medication Sig  Dispense Refill   apixaban (ELIQUIS) 5 MG TABS tablet Take 1 tablet twice daily 60 tablet 5   aspirin EC 81 MG tablet Take 81 mg by mouth daily. Swallow whole.     carvedilol (COREG) 12.5 MG tablet Take 1 tablet (12.5 mg total) by mouth 2 (two) times daily. 180 tablet 1   cefdinir (OMNICEF) 300 MG capsule 1 capsule Orally twice a day for 10 days     furosemide (LASIX) 40 MG tablet Take 1 tablet (40 mg total) by mouth daily. 90 tablet 3   losartan (COZAAR) 50 MG tablet Take 1 tablet (50 mg total) by mouth daily. 90 tablet 3   pantoprazole (PROTONIX) 40 MG tablet Take 1 tablet (40 mg total) by mouth daily. 30 tablet 2   Saccharomyces boulardii (PROBIOTIC) 250 MG CAPS Take 1 capsule by mouth daily.     spironolactone (ALDACTONE) 25 MG tablet Take 1 tablet (25 mg total) by mouth daily. 90 tablet 3   No current facility-administered medications for this visit.   Allergies:  Jardiance [empagliflozin]   Social History: The patient  reports that she has never smoked. She has never used smokeless tobacco. She reports that she does not drink alcohol and does not use drugs.   Family History: The patient's family history includes Heart attack in her father; Stroke in her mother.   ROS:  Please  see the history of present illness. Otherwise, complete review of systems is positive for none.  All other systems are reviewed and negative.   Physical Exam: VS:  BP (!) 144/70   Pulse 81   Ht 5\' 4"  (1.626 m)   Wt 148 lb 12.8 oz (67.5 kg)   SpO2 98%   BMI 25.54 kg/m , BMI Body mass index is 25.54 kg/m.  Wt Readings from Last 3 Encounters:  03/25/23 148 lb 12.8 oz (67.5 kg)  12/01/22 144 lb 10 oz (65.6 kg)  11/17/22 146 lb 9.7 oz (66.5 kg)    General: Patient appears comfortable at rest. HEENT: Conjunctiva and lids normal, oropharynx clear with moist mucosa. Neck: Supple, no elevated JVP or carotid bruits, no thyromegaly. Lungs: Clear to auscultation, nonlabored breathing at rest. Cardiac: Regular  rate and rhythm, no S3 or significant systolic murmur, no pericardial rub. Abdomen: Soft, nontender, no hepatomegaly, bowel sounds present, no guarding or rebound. Extremities: No pitting edema, distal pulses 2+. Skin: Warm and dry. Musculoskeletal: No kyphosis. Neuropsychiatric: Alert and oriented x3, affect grossly appropriate.  ECG: Normal sinus rhythm and left bundle branch block  Recent Labwork: 08/11/2022: Magnesium 2.3 08/22/2022: ALT 76; AST 35; BUN 29; Creatinine, Ser 1.08; Hemoglobin 11.7; Platelets 414; Potassium 3.1; Sodium 137 11/03/2022: B Natriuretic Peptide 198.0     Component Value Date/Time   TRIG 123 08/02/2022 0352    Other Studies Reviewed Today: Echo from 07/2022 LVEF 20 to 25% with anteroseptal akinesis RV systolic function is normal and RV size normal Mildly elevated pulmonary artery systolic pressures  Assessment and Plan: Patient is a 72 year old F known to have HTN, new onset cardiomyopathy (diagnosed in 07/2022), PE/DVT diagnosed in 7/23 on Eliquis presented to cardiology clinic for follow-up visit.  # NICM, LVEF 20 to 25% with no device, NYHA II # LBBB with QRS 152 msec -Echocardiogram is repeated in 5/24 after 3 months of GDMT and LVEF continues to be 20 to 25%. Placed referral to electrophysiology to evaluate CRT-D candidacy. Also placed referral to CHF clinic. -Continue p.o. Lasix 40 mg once daily -Continue carvedilol 12.5 mg twice daily -Continue losartan 50 mg once daily (not able to afford Entresto) -Continue spironolactone 20 mg once daily -Off Jardiance due to tenderness in the perineal area  # Acute LUL PE # Acute R DVT -Continue Eliquis 5 mg twice daily for total duration of 6 months -Currently she is being $190 per month for Eliquis co-pay and wants to think about switching to Coumadin but reluctant due to frequent INR checks. -Discontinue aspirin  # HLD, unknown value -Continue atorvastatin 40 mg nightly.  # Liver mass on CT cardiac  from 10/08/2022 -Suspected mass lesion in the right lobe of liver concerning for tumor, MR imaging with and without contrast was recommended. Follow-up with PCP for further MRI imaging.  I have spent a total of 32 minutes with patient reviewing chart, EKGs, labs and examining patient as well as establishing an assessment and plan that was discussed with the patient.  > 50% of time was spent in direct patient care.      Medication Adjustments/Labs and Tests Ordered: Current medicines are reviewed at length with the patient today.  Concerns regarding medicines are outlined above.   Tests Ordered: Orders Placed This Encounter  Procedures   Ambulatory referral to Cardiac Electrophysiology   AMB referral to CHF clinic    Medication Changes: No orders of the defined types were placed in this encounter.   Disposition:  Follow up  6 month  Signed Frady Taddeo Verne Spurr, MD, 03/30/2023 3:49 PM    [facility] at [facility] [address] [place], [place], [place] [address]

## 2023-03-31 ENCOUNTER — Telehealth: Payer: Self-pay

## 2023-03-31 NOTE — Telephone Encounter (Signed)
-----   Message from Marjo Bicker, MD sent at 03/30/2023  3:56 PM EDT ----- Regarding: Discontinue aspirin Can you tell patient to discontinue aspirin? She does not need it as her CTA cardiac showed minimal CAD.

## 2023-03-31 NOTE — Telephone Encounter (Signed)
Patient notified and will stop ASA,pcp copied

## 2023-04-13 ENCOUNTER — Telehealth: Payer: Self-pay | Admitting: Internal Medicine

## 2023-04-13 DIAGNOSIS — I5022 Chronic systolic (congestive) heart failure: Secondary | ICD-10-CM

## 2023-04-13 MED ORDER — FUROSEMIDE 40 MG PO TABS: 40.0000 mg | ORAL_TABLET | Freq: Two times a day (BID) | ORAL | 3 refills | Status: DC

## 2023-04-13 MED ORDER — FUROSCIX 80 MG/10ML ~~LOC~~ CTKT: 80.0000 mg | CARTRIDGE | SUBCUTANEOUS | 0 refills | Status: DC | PRN

## 2023-04-13 NOTE — Telephone Encounter (Signed)
Patient notified and verbalized understanding. Patient had no further complaints at this time.   BNP/BMP ordered Lasix increased to 40 mg po BID Appt 7/9 @ 10 with MD Furoscix ordered.

## 2023-04-13 NOTE — Telephone Encounter (Signed)
Pt c/o Shortness Of Breath: STAT if SOB developed within the last 24 hours or pt is noticeably SOB on the phone  1. Are you currently SOB (can you hear that pt is SOB on the phone)?  No  2. How long have you been experiencing SOB?    3. Are you SOB when sitting or when up moving around?  When up and moving around  4. Are you currently experiencing any other symptoms?  Fatigue, low energy

## 2023-04-13 NOTE — Telephone Encounter (Signed)
Spoke to pt who stated she has been SOB for the last 5-6 days. Pt stated she has no energy at all. Pt denies CP, swelling. Pt has appt w/provider in December and wants to know if that needs to be moved up.   Please advise.

## 2023-04-16 ENCOUNTER — Other Ambulatory Visit (HOSPITAL_COMMUNITY)
Admission: RE | Admit: 2023-04-16 | Discharge: 2023-04-16 | Disposition: A | Discharge status: Home / Self Care | Payer: Medicare HMO | Source: Ambulatory Visit | Attending: Internal Medicine | Admitting: Internal Medicine

## 2023-04-16 DIAGNOSIS — I5022 Chronic systolic (congestive) heart failure: Secondary | ICD-10-CM | POA: Diagnosis present

## 2023-04-16 LAB — BRAIN NATRIURETIC PEPTIDE: B Natriuretic Peptide: 179 pg/mL — ABNORMAL HIGH (ref 0.0–100.0)

## 2023-04-16 LAB — BASIC METABOLIC PANEL
Anion gap: 11 (ref 5–15)
BUN: 17 mg/dL (ref 8–23)
CO2: 25 mmol/L (ref 22–32)
Calcium: 9.6 mg/dL (ref 8.9–10.3)
Chloride: 102 mmol/L (ref 98–111)
Creatinine, Ser: 1.08 mg/dL — ABNORMAL HIGH (ref 0.44–1.00)
GFR, Estimated: 55 mL/min — ABNORMAL LOW (ref >60)
Glucose, Bld: 140 mg/dL — ABNORMAL HIGH (ref 70–99)
Potassium: 3.6 mmol/L (ref 3.5–5.1)
Sodium: 138 mmol/L (ref 135–145)

## 2023-04-19 ENCOUNTER — Other Ambulatory Visit: Payer: Self-pay

## 2023-04-19 ENCOUNTER — Emergency Department (HOSPITAL_COMMUNITY)
Admission: EM | Admit: 2023-04-19 | Discharge: 2023-04-19 | Disposition: A | Discharge status: Home / Self Care | Payer: Medicare HMO | Attending: Emergency Medicine | Admitting: Emergency Medicine

## 2023-04-19 ENCOUNTER — Encounter (HOSPITAL_COMMUNITY): Payer: Self-pay

## 2023-04-19 ENCOUNTER — Emergency Department (HOSPITAL_COMMUNITY): Payer: Medicare HMO

## 2023-04-19 DIAGNOSIS — R0602 Shortness of breath: Secondary | ICD-10-CM | POA: Insufficient documentation

## 2023-04-19 DIAGNOSIS — R079 Chest pain, unspecified: Secondary | ICD-10-CM | POA: Diagnosis not present

## 2023-04-19 DIAGNOSIS — I110 Hypertensive heart disease with heart failure: Secondary | ICD-10-CM | POA: Diagnosis not present

## 2023-04-19 DIAGNOSIS — I509 Heart failure, unspecified: Secondary | ICD-10-CM | POA: Insufficient documentation

## 2023-04-19 HISTORY — DX: Heart failure, unspecified: I50.9

## 2023-04-19 LAB — CBC WITH DIFFERENTIAL/PLATELET
Abs Immature Granulocytes: 0.05 10*3/uL (ref 0.00–0.07)
Basophils Absolute: 0.1 10*3/uL (ref 0.0–0.1)
Basophils Relative: 0 %
Eosinophils Absolute: 0.2 10*3/uL (ref 0.0–0.5)
Eosinophils Relative: 2 %
HCT: 37.5 % (ref 36.0–46.0)
Hemoglobin: 12.5 g/dL (ref 12.0–15.0)
Immature Granulocytes: 0 %
Lymphocytes Relative: 29 %
Lymphs Abs: 3.5 10*3/uL (ref 0.7–4.0)
MCH: 28.7 pg (ref 26.0–34.0)
MCHC: 33.3 g/dL (ref 30.0–36.0)
MCV: 86.2 fL (ref 80.0–100.0)
Monocytes Absolute: 0.7 10*3/uL (ref 0.1–1.0)
Monocytes Relative: 5 %
Neutro Abs: 7.7 10*3/uL (ref 1.7–7.7)
Neutrophils Relative %: 64 %
Platelets: 243 10*3/uL (ref 150–400)
RBC: 4.35 MIL/uL (ref 3.87–5.11)
RDW: 12.4 % (ref 11.5–15.5)
WBC: 12.1 10*3/uL — ABNORMAL HIGH (ref 4.0–10.5)
nRBC: 0 % (ref 0.0–0.2)

## 2023-04-19 LAB — TROPONIN I (HIGH SENSITIVITY)
Troponin I (High Sensitivity): 17 ng/L (ref <18)
Troponin I (High Sensitivity): 16 ng/L (ref <18)

## 2023-04-19 LAB — BASIC METABOLIC PANEL
Anion gap: 12 (ref 5–15)
BUN: 23 mg/dL (ref 8–23)
CO2: 26 mmol/L (ref 22–32)
Calcium: 9.7 mg/dL (ref 8.9–10.3)
Chloride: 100 mmol/L (ref 98–111)
Creatinine, Ser: 1.1 mg/dL — ABNORMAL HIGH (ref 0.44–1.00)
GFR, Estimated: 54 mL/min — ABNORMAL LOW (ref >60)
Glucose, Bld: 139 mg/dL — ABNORMAL HIGH (ref 70–99)
Potassium: 3.5 mmol/L (ref 3.5–5.1)
Sodium: 138 mmol/L (ref 135–145)

## 2023-04-19 LAB — D-DIMER, QUANTITATIVE: D-Dimer, Quant: 0.29 ug/mL-FEU (ref 0.00–0.50)

## 2023-04-19 LAB — PROTIME-INR
INR: 1.1 (ref 0.8–1.2)
Prothrombin Time: 14.5 seconds (ref 11.4–15.2)

## 2023-04-19 LAB — BRAIN NATRIURETIC PEPTIDE: B Natriuretic Peptide: 187 pg/mL — ABNORMAL HIGH (ref 0.0–100.0)

## 2023-04-19 NOTE — ED Provider Notes (Signed)
[facility]PT [facility] Emergency Department Provider Note MRN:  [id number]  Arrival date & time: 04/19/23     Chief Complaint   Shortness of Breath   History of Present Illness   Summer Christensen is a 72 y.o. year-old female with a history of PE, CHF presenting to the ED with chief complaint of shortness of breath.  Chest pain and shortness of breath this evening, pain in the center of the chest, denies dizziness or diaphoresis, no nausea or vomiting.  No leg pain or swelling.  No abdominal pain.  Feeling a lot better over the past hour or 2, currently without symptoms.  Review of Systems  A thorough review of systems was obtained and all systems are negative except as noted in the HPI and PMH.   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   ROTATOR CUFF REPAIR  2009   arthroscopic   TUBAL LIGATION     bilateral    Family History  Problem Relation Age of Onset   Stroke Mother    Heart attack Father     Social History   Socioeconomic History   Marital status: Divorced    Spouse name: Not on file   Number of children: 3   Years of education: Not on file   Highest education level: Not on file  Occupational History   Occupation: post office clerk    Employer: [employer]  Tobacco Use   Smoking status: Never    Passive exposure: Never   Smokeless tobacco: Never  Vaping Use   Vaping Use: Never used  Substance and Sexual Activity   Alcohol use: No   Drug use: No   Sexual  activity: Never    Birth control/protection: Surgical    Comment: hyst  Other Topics Concern   Not on file  Social History Narrative   Not on file   Social Determinants of Health   Financial Resource Strain: Not on file  Food Insecurity: No Food Insecurity (08/11/2022)   Hunger Vital Sign    Worried About Running Out of Food in the Last Year: Never true    Ran Out of Food in the Last Year: Never true  Transportation Needs: No Transportation Needs (08/11/2022)   PRAPARE - Administrator, Civil Service (Medical): No    Lack of Transportation (Non-Medical): No  Physical Activity: Not on file  Stress: Not on file  Social Connections: Not on file  Intimate Partner Violence: Not At Risk (08/11/2022)   Humiliation, Afraid, Rape, and Kick questionnaire    Fear of Current or Ex-Partner: No    Emotionally Abused: No    Physically Abused: No    Sexually Abused: No     Physical Exam   Vitals:   04/19/23 0630 04/19/23 0700  BP: 131/67 129/65  Pulse: 63 63  Resp: 16 15  Temp:    SpO2: 97%  97%    CONSTITUTIONAL: Well-appearing, NAD NEURO/PSYCH:  Alert and oriented x 3, no focal deficits EYES:  eyes equal and reactive ENT/NECK:  no LAD, no JVD CARDIO: Regular rate, well-perfused, normal S1 and S2 PULM:  CTAB no wheezing or rhonchi GI/GU:  non-distended, non-tender MSK/SPINE:  No gross deformities, no edema SKIN:  no rash, atraumatic   *Additional and/or pertinent findings included in MDM below  Diagnostic and Interventional Summary    EKG Interpretation Date/Time:  Sunday April 19 2023 03:40:39 EDT Ventricular Rate:  67 PR Interval:  193 QRS Duration:  163 QT Interval:  465 QTC Calculation: 491 R Axis:   88  Text Interpretation: Sinus rhythm Consider left atrial enlargement LVH with secondary repolarization abnormality Borderline prolonged QT interval Confirmed by Kennis Carina [phone]) on 04/19/2023 3:42:02 AM       Labs Reviewed  BASIC METABOLIC PANEL -  Abnormal; Notable for the following components:      Result Value   Glucose, Bld 139 (*)    Creatinine, Ser 1.10 (*)    GFR, Estimated 54 (*)    All other components within normal limits  CBC WITH DIFFERENTIAL/PLATELET - Abnormal; Notable for the following components:   WBC 12.1 (*)    All other components within normal limits  PROTIME-INR  D-DIMER, QUANTITATIVE  BRAIN NATRIURETIC PEPTIDE  TROPONIN I (HIGH SENSITIVITY)  TROPONIN I (HIGH SENSITIVITY)    DG Chest 2 View  Final Result      Medications - No data to display   Procedures  /  Critical Care Procedures  ED Course and Medical Decision Making  Initial Impression and Ddx Differential diagnosis includes ACS, PE, CHF exacerbation.  Past medical/surgical history that increases complexity of ED encounter: CHF  Interpretation of Diagnostics I personally reviewed the EKG and my interpretation is as follows: No significant change from prior  Labs overall reassuring with no significant blood count or electrolyte disturbance.  Troponin negative x 2.  D-dimer negative.  Chest x-ray normal  Patient Reassessment and Ultimate Disposition/Management     Patient has been symptom-free for a few hours and has a reassuring evaluation.  Vital signs normal.  Appropriate for discharge with return precautions.  Patient management required discussion with the following services or consulting groups:  None  Complexity of Problems Addressed Acute illness or injury that poses threat of life of bodily function  Additional Data Reviewed and Analyzed Further history obtained from: Prior labs/imaging results  Additional Factors Impacting ED Encounter Risk None  Elmer Sow. Pilar Plate, MD [facility] Emergency Medicine [facility] [email or web address]  Final Clinical Impressions(s) / ED Diagnoses     ICD-10-CM   1. SOB (shortness of breath)  R06.02     2. Chest pain, unspecified type  R07.9       ED Discharge Orders      None        Discharge Instructions Discussed with and Provided to Patient:     Discharge Instructions      You were evaluated in the Emergency Department and after careful evaluation, we did not find any emergent condition requiring admission or further testing in the hospital.  Your exam/testing today is overall reassuring.  Recommend follow-up with your regular doctor to discuss your symptoms.  Please return to the Emergency Department if you experience any worsening of your condition.   Thank you for allowing [place] to be a part of your care.       Sabas Sous, MD 04/19/23  0706  

## 2023-04-19 NOTE — Discharge Instructions (Addendum)
You were evaluated in the Emergency Department and after careful evaluation, we did not find any emergent condition requiring admission or further testing in the hospital. ? ?Your exam/testing today is overall reassuring.  Recommend follow-up with your regular doctor to discuss your symptoms. ? ?Please return to the Emergency Department if you experience any worsening of your condition.   Thank you for allowing us to be a part of your care. ?

## 2023-04-19 NOTE — ED Triage Notes (Signed)
Pt arrived via POV from home c/o SOB and chest pain that is worse when she tries to lay down and go to sleep. Pt recently seen by PCP for her CHF.

## 2023-04-27 ENCOUNTER — Ambulatory Visit: Payer: Medicare HMO | Attending: Internal Medicine | Admitting: Internal Medicine

## 2023-04-27 ENCOUNTER — Encounter: Payer: Self-pay | Admitting: Internal Medicine

## 2023-04-27 VITALS — BP 110/66 | HR 82 | Ht 64.0 in | Wt 147.2 lb

## 2023-04-27 DIAGNOSIS — I428 Other cardiomyopathies: Secondary | ICD-10-CM | POA: Diagnosis not present

## 2023-04-27 DIAGNOSIS — I824Z1 Acute embolism and thrombosis of unspecified deep veins of right distal lower extremity: Secondary | ICD-10-CM

## 2023-04-27 DIAGNOSIS — I5022 Chronic systolic (congestive) heart failure: Secondary | ICD-10-CM | POA: Diagnosis not present

## 2023-04-27 MED ORDER — ENTRESTO 49-51 MG PO TABS: 1.0000 | ORAL_TABLET | Freq: Two times a day (BID) | ORAL | 2 refills | Status: DC

## 2023-04-27 NOTE — Patient Instructions (Addendum)
Medication Instructions:  Your physician has recommended you make the following change in your medication:  Start taking Torsemide 20 mg twice a day Stop taking Losartan  Start taking Entresto 49-51 mg twice a day Continue taking all other medications as prescribed  Labwork: BMET at [facility] on 05/04/2023  Testing/Procedures: None  Follow-Up: Your physician recommends that you schedule a follow-up appointment in: Pending  Any Other Special Instructions Will Be Listed Below (If Applicable).  Referred to Hematology  If you need a refill on your cardiac medications before your next appointment, please call your pharmacy.

## 2023-04-27 NOTE — Progress Notes (Signed)
Cardiology Office Note  Date: 04/27/2023   ID: Summer Christensen, Summer Christensen 1951-04-24, MRN [id number]  PCP:  Smith Robert, MD  Cardiologist:  Marjo Bicker, MD Electrophysiologist:  None   Reason for Office Visit: Follow-up of NICM   History of Present Illness: Summer Christensen is a 72 y.o. 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  Patient was diagnosed with new onset cardiomyopathy with LVEF less than 20% when she was admitted to [facility] in 07/2022 with severe sepsis secondary to pneumonia.  She was readmitted again in 2 weeks and was diagnosed with LUL pulmonary embolus and acute DVT of her right leg for which she is currently on Eliquis.  CTA cardiac showed coronary calcium score of 53.8 and mild calcified plaque in the ostial LAD. Patient was on GDMT for 3 months after which repeat echocardiogram continue to show LVEF 20 to 25%.  Initially was on Entresto which was switched to losartan due to higher co-pay, currently has a different insurance which can pay Entresto.  She is here for follow-up visit.  Had interval ER visit on 04/19/2023 for chest pain and SOB.  Mildly elevated BNP.  No change in medications in the ER visit.  She continues to have SOB and feels tired all the time.  No orthopnea or PND.  No leg swelling.  No chest pains.  No syncope, dizziness.  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   ROTATOR CUFF REPAIR  2009   arthroscopic   TUBAL LIGATION      bilateral    Current Outpatient Medications  Medication Sig Dispense Refill   apixaban (ELIQUIS) 5 MG TABS tablet Take 1 tablet twice daily 60 tablet 5   carvedilol (COREG) 12.5 MG tablet Take 1 tablet (12.5 mg total) by mouth 2 (two) times daily. 180 tablet 1   cefdinir (OMNICEF) 300 MG capsule 1 capsule Orally twice a day for 10 days     Furosemide (FUROSCIX) 80 MG/10ML CTKT Inject 80 mg into the skin as needed. 2 each 0   furosemide (LASIX) 40 MG tablet Take 1 tablet (40 mg total) by mouth 2 (two) times daily. 180 tablet 3   pantoprazole (PROTONIX) 40 MG tablet Take 1 tablet (40 mg total) by mouth daily. 30 tablet 2   Saccharomyces boulardii (PROBIOTIC) 250 MG CAPS Take 1 capsule by mouth daily.     sacubitril-valsartan (ENTRESTO) 49-51 MG Take 1 tablet by mouth 2 (two) times daily. 180 tablet 2   spironolactone (ALDACTONE) 25 MG tablet Take 1 tablet (25 mg total) by mouth daily. 90 tablet 3   No current facility-administered medications for this visit.   Allergies:  Jardiance [empagliflozin]   Social History: The patient  reports that she has never smoked. She has never been exposed to tobacco smoke. She has never used smokeless tobacco. She reports that  she does not drink alcohol and does not use drugs.   Family History: The patient's family history includes Heart attack in her father; Stroke in her mother.   ROS:  Please see the history of present illness. Otherwise, complete review of systems is positive for none.  All other systems are reviewed and negative.   Physical Exam: VS:  BP 110/66   Pulse 82   Ht 5\' 4"  (1.626 m)   Wt 147 lb 3.2 oz (66.8 kg)   SpO2 96%   BMI 25.27 kg/m , BMI Body mass index is 25.27 kg/m.  Wt Readings from Last 3 Encounters:  04/27/23 147 lb 3.2 oz (66.8 kg)  04/19/23 140 lb (63.5 kg)  03/25/23 148 lb 12.8 oz (67.5 kg)    General: Patient appears comfortable at rest. HEENT: Conjunctiva and lids normal, oropharynx clear with moist  mucosa. Neck: Supple, no elevated JVP or carotid bruits, no thyromegaly. Lungs: Clear to auscultation, nonlabored breathing at rest. Cardiac: Regular rate and rhythm, no S3 or significant systolic murmur, no pericardial rub. Abdomen: Soft, nontender, no hepatomegaly, bowel sounds present, no guarding or rebound. Extremities: No pitting edema, distal pulses 2+. Skin: Warm and dry. Musculoskeletal: No kyphosis. Neuropsychiatric: Alert and oriented x3, affect grossly appropriate.  ECG: Normal sinus rhythm and left bundle branch block  Recent Labwork: 08/11/2022: Magnesium 2.3 08/22/2022: ALT 76; AST 35 04/19/2023: B Natriuretic Peptide 187.0; BUN 23; Creatinine, Ser 1.10; Hemoglobin 12.5; Platelets 243; Potassium 3.5; Sodium 138     Component Value Date/Time   TRIG 123 08/02/2022 0352    Other Studies Reviewed Today: Echo from 07/2022 LVEF 20 to 25% with anteroseptal akinesis RV systolic function is normal and RV size normal Mildly elevated pulmonary artery systolic pressures  Assessment and Plan: Patient is a 72 year old F known to have NICM LVEF 20-25% with no device, PE/DVT diagnosed in 7/23 on Eliquis presented to cardiology clinic for follow-up visit.  # NICM, LVEF 20 to 25% with no device, NYHA II # LBBB with QRS 152 msec -Echocardiogram is repeated in 5/24 after 3 months of GDMT and LVEF continues to be 20 to 25%.  EKG shows LBBB, QRS more than 150 ms. Placed referral to electrophysiology to evaluate CRT-D candidacy. Also placed referral to CHF clinic. -Patient feels SOB and tired all the time.  No orthopnea or PND.  Currently on p.o. Lasix 40 mg twice daily with mildly elevated BNP, 100s.  Will switch Lasix to torsemide 20 mg twice daily.  Obtain BMP in 5 days. -Continue carvedilol 12.5 mg twice daily. -Switch losartan 50 mg to Entresto 49-51 mg twice daily (not able to afford Entresto due to change in insurance) -Continue spironolactone 25 mg once daily -Off Jardiance due to  tenderness in the perineal area  # Acute LUL PE in 2023 # Acute R DVT in 2023 -Continue Eliquis 5 mg twice daily -Ambulatory referral to hematology to determine the duration of systemic anticoagulation for thromboembolism.  # HLD, unknown value -Continue atorvastatin 40 mg nightly.  # Liver mass on CT cardiac from 10/08/2022 -Suspected mass lesion in the right lobe of liver concerning for tumor, MR imaging with and without contrast was recommended. Follow-up with PCP for further MRI imaging.  I have spent a total of 32 minutes with patient reviewing chart, EKGs, labs and examining patient as well as establishing an assessment and plan that was discussed with the patient.  > 50% of time was spent in direct patient care.  Medication Adjustments/Labs and Tests Ordered: Current medicines are reviewed at length with the patient today.  Concerns regarding medicines are outlined above.   Tests Ordered: Orders Placed This Encounter  Procedures   Basic metabolic panel   Ambulatory referral to Hematology / Oncology    Medication Changes: Meds ordered this encounter  Medications   sacubitril-valsartan (ENTRESTO) 49-51 MG    Sig: Take 1 tablet by mouth 2 (two) times daily.    Dispense:  180 tablet    Refill:  2    04/27/2023-New    Disposition:  Follow up  6 month  Signed Arali Somera Verne Spurr, MD, 04/27/2023 11:38 AM    [facility] at [facility] [address] [place], [place], [place] [address]

## 2023-04-28 ENCOUNTER — Ambulatory Visit: Payer: 59 | Admitting: Internal Medicine

## 2023-05-04 ENCOUNTER — Other Ambulatory Visit (HOSPITAL_COMMUNITY)
Admission: RE | Admit: 2023-05-04 | Discharge: 2023-05-04 | Disposition: A | Discharge status: Home / Self Care | Payer: Medicare HMO | Source: Ambulatory Visit | Attending: Internal Medicine | Admitting: Internal Medicine

## 2023-05-04 ENCOUNTER — Telehealth (HOSPITAL_COMMUNITY): Payer: Self-pay

## 2023-05-04 ENCOUNTER — Other Ambulatory Visit (HOSPITAL_COMMUNITY): Payer: Self-pay

## 2023-05-04 ENCOUNTER — Encounter (HOSPITAL_COMMUNITY): Payer: Self-pay | Admitting: Cardiology

## 2023-05-04 ENCOUNTER — Ambulatory Visit (HOSPITAL_COMMUNITY)
Admission: RE | Admit: 2023-05-04 | Discharge: 2023-05-04 | Disposition: A | Discharge status: Home / Self Care | Payer: Medicare HMO | Source: Ambulatory Visit | Attending: Cardiology | Admitting: Cardiology

## 2023-05-04 VITALS — BP 120/70 | HR 87 | Wt 149.0 lb

## 2023-05-04 DIAGNOSIS — I447 Left bundle-branch block, unspecified: Secondary | ICD-10-CM | POA: Diagnosis not present

## 2023-05-04 DIAGNOSIS — Z79899 Other long term (current) drug therapy: Secondary | ICD-10-CM | POA: Diagnosis not present

## 2023-05-04 DIAGNOSIS — E785 Hyperlipidemia, unspecified: Secondary | ICD-10-CM | POA: Diagnosis not present

## 2023-05-04 DIAGNOSIS — I5022 Chronic systolic (congestive) heart failure: Secondary | ICD-10-CM | POA: Diagnosis present

## 2023-05-04 DIAGNOSIS — Z8619 Personal history of other infectious and parasitic diseases: Secondary | ICD-10-CM | POA: Insufficient documentation

## 2023-05-04 DIAGNOSIS — I428 Other cardiomyopathies: Secondary | ICD-10-CM

## 2023-05-04 DIAGNOSIS — I824Z1 Acute embolism and thrombosis of unspecified deep veins of right distal lower extremity: Secondary | ICD-10-CM

## 2023-05-04 DIAGNOSIS — I110 Hypertensive heart disease with heart failure: Secondary | ICD-10-CM | POA: Diagnosis not present

## 2023-05-04 DIAGNOSIS — Z7901 Long term (current) use of anticoagulants: Secondary | ICD-10-CM | POA: Insufficient documentation

## 2023-05-04 DIAGNOSIS — I340 Nonrheumatic mitral (valve) insufficiency: Secondary | ICD-10-CM | POA: Diagnosis not present

## 2023-05-04 DIAGNOSIS — Z86718 Personal history of other venous thrombosis and embolism: Secondary | ICD-10-CM | POA: Diagnosis not present

## 2023-05-04 DIAGNOSIS — Z86711 Personal history of pulmonary embolism: Secondary | ICD-10-CM | POA: Diagnosis not present

## 2023-05-04 DIAGNOSIS — R160 Hepatomegaly, not elsewhere classified: Secondary | ICD-10-CM | POA: Diagnosis not present

## 2023-05-04 DIAGNOSIS — Z9289 Personal history of other medical treatment: Secondary | ICD-10-CM | POA: Diagnosis not present

## 2023-05-04 LAB — BASIC METABOLIC PANEL
Anion gap: 10 (ref 5–15)
BUN: 14 mg/dL (ref 8–23)
CO2: 26 mmol/L (ref 22–32)
Calcium: 9.6 mg/dL (ref 8.9–10.3)
Chloride: 103 mmol/L (ref 98–111)
Creatinine, Ser: 1.1 mg/dL — ABNORMAL HIGH (ref 0.44–1.00)
GFR, Estimated: 54 mL/min — ABNORMAL LOW (ref >60)
Glucose, Bld: 212 mg/dL — ABNORMAL HIGH (ref 70–99)
Potassium: 3.1 mmol/L — ABNORMAL LOW (ref 3.5–5.1)
Sodium: 139 mmol/L (ref 135–145)

## 2023-05-04 MED ORDER — DIGOXIN 125 MCG PO TABS: 0.1250 mg | ORAL_TABLET | Freq: Every day | ORAL | 3 refills | Status: DC

## 2023-05-04 MED ORDER — ENTRESTO 97-103 MG PO TABS: 1.0000 | ORAL_TABLET | Freq: Two times a day (BID) | ORAL | 6 refills | Status: DC

## 2023-05-04 NOTE — Telephone Encounter (Signed)
Advanced Heart Failure Patient Advocate Encounter  The patient was approved for a [employer] grant that will help cover the cost of Carvedilol, Entresto, Jardiance, Spironolactone.  Total amount awarded, $10,000.  Effective: 04/04/2023 - 04/02/2024.  BIN [id number] PCN [id number] [id number] ID [id number]  Patient provided with approval and processing information in office.  Burnell Blanks, CPhT Rx Patient Advocate Phone: [phone]

## 2023-05-04 NOTE — Progress Notes (Signed)
ADVANCED HEART FAILURE CLINIC NOTE  Referring Physician: Smith Robert, MD  Primary Care: Smith Robert, MD Primary Cardiologist: Dr. Jenene Slicker HF: Dr. Gasper Lloyd  HPI: Summer Christensen is a 72 y.o. female with hypertension, nonischemic cardiomyopathy with EF 20 to 25%, history of VTE with PE in October 2023 presenting today to establish care.  She was diagnosed with heart failure at [facility] in October 2023 when admitted for septic shock secondary to pneumonia.  She was readmitted 2 weeks after with left upper lobe PE and acute DVT in the right leg and subsequently started on Eliquis.  Follow-up coronary CTA with coronary calcium score of 53.  During this time GDMT was uptitrated with repeat TTE demonstrating EF of 20 to 25%.  Since that time she has continued to follow with general cardiology where she has remained fairly dyspneic and fatigued.  Interval hx:  She becomes very quickly fatigued. She reports becoming fatigued and having to stop multiple times due to dyspnea when sweeping an area that is roughly 20sqft. She becomes fatigued with just washing dishes. Her appetite is also poor due to early satiety. She has difficulty sleeping due to stress.   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 180 tablet 1   furosemide (LASIX) 40 MG tablet Take 1 tablet  (40 mg total) by mouth 2 (two) times daily. 180 tablet 3   pantoprazole (PROTONIX) 40 MG tablet Take 1 tablet (40 mg total) by mouth daily. 30 tablet 2   Saccharomyces boulardii (PROBIOTIC) 250 MG CAPS Take 1 capsule by mouth daily.     sacubitril-valsartan (ENTRESTO) 49-51 MG Take 1 tablet by mouth 2 (two) times daily. 180 tablet 2   spironolactone (ALDACTONE) 25 MG tablet Take 1 tablet (25 mg total) by mouth daily. 90 tablet 3   Furosemide (FUROSCIX) 80 MG/10ML CTKT Inject 80 mg into the skin as needed. (Patient not taking: Reported on 05/04/2023) 2 each 0   No current facility-administered medications for this encounter.    Allergies  Allergen Reactions   Jardiance [Empagliflozin] Itching and Swelling      Social History   Socioeconomic History   Marital status: Divorced    Spouse name: Not on file   Number of children: 3   Years of education: Not on file   Highest education level: Not on file  Occupational History   Occupation: post office clerk    Employer: [employer]  Tobacco Use   Smoking status: Never    Passive exposure: Never   Smokeless tobacco: Never  Vaping Use   Vaping status: Never Used  Substance and Sexual Activity   Alcohol use: No   Drug use: No   Sexual activity: Never    Birth control/protection: Surgical    Comment: hyst  Other Topics Concern  Not on file  Social History Narrative   Not on file   Social Determinants of Health   Financial Resource Strain: Not on file  Food Insecurity: No Food Insecurity (08/11/2022)   Hunger Vital Sign    Worried About Running Out of Food in the Last Year: Never true    Ran Out of Food in the Last Year: Never true  Transportation Needs: No Transportation Needs (08/11/2022)   PRAPARE - Administrator, Civil Service (Medical): No    Lack of Transportation (Non-Medical): No  Physical Activity: Not on file  Stress: Not on file  Social Connections: Not on file  Intimate Partner Violence: Not  At Risk (08/11/2022)   Humiliation, Afraid, Rape, and Kick questionnaire    Fear of Current or Ex-Partner: No    Emotionally Abused: No    Physically Abused: No    Sexually Abused: No      Family History  Problem Relation Age of Onset   Stroke Mother    Heart attack Father     PHYSICAL EXAM: Vitals:   05/04/23 1036  BP: 120/70  Pulse: 87  SpO2: 97%   GENERAL: Well nourished, well developed, and in no apparent distress at rest.  HEENT: Negative for arcus senilis or xanthelasma. There is no scleral icterus.  The mucous membranes are pink and moist.   NECK: Supple, No masses. Normal carotid upstrokes without bruits. No masses or thyromegaly.    CHEST: There are no chest wall deformities. There is no chest wall tenderness. Respirations are unlabored.  Lungs- CTA B/L CARDIAC:  JVP: 7 cm H2O         Normal S1, S2  Normal rate with regular rhythm. 2/6 SEM, rubs or gallops.  Pulses are 2+ and symmetrical in upper and lower extremities. No edema.  ABDOMEN: Soft, non-tender, non-distended. There are no masses or hepatomegaly. There are normal bowel sounds.  EXTREMITIES: Warm and well perfused with no cyanosis, clubbing.  LYMPHATIC: No axillary or supraclavicular lymphadenopathy.  NEUROLOGIC: Patient is oriented x3 with no focal or lateralizing neurologic deficits.  PSYCH: Patients affect is appropriate, there is no evidence of anxiety or depression.  SKIN: Warm and dry; no lesions or wounds.   DATA REVIEW  ECG: 12/21/11: NSR w/ LBBB  as per my personal interpretation 04/19/23: NSR w/ LBBB as per my read  ECHO: 02/20/23: LVEF 25% with intraventricular dyssynchrony due to LBBB, normal RV function as per my personal interpretation  CATH: N/A  Coronary CTA: 09/23/22 1. Coronary calcium score of 53.8. This was 10 percentile for age-, sex, and race-matched controls.  2. Normal coronary origin with right dominance.  3. Mild (25-49) calcified plaque in the ostial LAD.  4. Aortic  atherosclerosis.  5. Left ventricular enlargement.   ASSESSMENT & PLAN:  Heart failure with reduced ejection fraction Etiology of HF: Nonischemic cardiomyopathy with LBBB. Plan for CMR to re-assess LV function and degree of LGE. She has had a LBBB on EKGs dating back to 2013 NYHA class / AHA Stage:III; I do have some concern for low output HF. Low threshold for RHC.  Volume status & Diuretics: Euvolemic Vasodilators:Entresto 49/51mg  BID; will increase to 97/103mg  BID Beta-Blocker:start digoxin daily, repeat labs in 1 week. Continue coreg 12.5mg  BID.  UYQ:[id number] 25mg  daily.  Cardiometabolic:jardiance 10mg   Devices therapies & Valvulopathies:Appt with Dr. Nelly Laurence in 1 month. Will require biventricular pacing; I suspect her MR will improvement with GDMT & CRT-D Advanced therapies:Not currently indicated, however, low threshold  for RHC.   2. Moderate to severe MR - Functional MR - Will titrate GDMT; plan for CRT-D prior to consideration for TEER.   3. Hyperlipidemia - Lipitor 40mg  - Repeat lipid panel today  4. Left upper lobe PE, B/L DVTs - started on AC in 07/2022.  - Likely provoked PE/DVT after admission for septic shock and prolonged time in bed.  - D/C apixaban at follow up.   5. 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    Asma Boldon Advanced Heart Failure Mechanical Circulatory Support

## 2023-05-04 NOTE — Progress Notes (Signed)
Medication Samples have been provided to the patient.  Drug name: Eliquis       Strength: 5mg         Qty: 5  LOT: [id number]  Exp.Date: 07/2024  Dosing instructions: take 1 tab Twice daily   The patient has been instructed regarding the correct time, dose, and frequency of taking this medication, including desired effects and most common side effects.   Allisen Pidgeon 11:28 AM 05/04/2023

## 2023-05-04 NOTE — Patient Instructions (Signed)
Medication Changes:  Increase Entresto to 97/103 mg Twice daily   START Digoxin 0.125 mcg Daily  Take Potassium 40 meq (2 tab) TODAY ONLY  Lab Work:  Your physician recommends that you return for lab work in: 1-2 weeks, we have provided you with prescriptions to have these done locally  Testing/Procedures:  Cardiac MRI needed, you will be called to schedule this, see instructions below  Special Instructions // Education:  Do the following things EVERYDAY: Weigh yourself in the morning before breakfast. Write it down and keep it in a log. Take your medicines as prescribed Eat low salt foods--Limit salt (sodium) to 2000 mg per day.  Stay as active as you can everyday Limit all fluids for the day to less than 2 liters  Cardiac MRI Instructions: [facility] [address] [place], [place] [address] [phone] Please take advantage of the free valet parking available at the [facility] and [employer] (Entrance C).  Proceed to the [facility] Radiology Department (First Floor) for check-in.   Magnetic resonance imaging (MRI) is a painless test that produces images of the inside of the body without using Xrays.  During an MRI, strong magnets and radio waves work together in a Data processing manager to form detailed images.   MRI images may provide more details about a medical condition than X-rays, CT scans, and ultrasounds can provide.  You may be given earphones to listen for instructions.  You may eat a light breakfast and take medications as ordered  AM OF TEST PLEASE DO NOT TAKE SPIRONOLACTONE Please avoid stimulants for 12 hr prior to test. (Ie. Caffeine, nicotine, chocolate, or antihistamine medications)  If a contrast material will be used, an IV will be inserted into one of your veins. Contrast material will be injected into your IV. It will leave your body through your urine within a day. You may be told to drink plenty of fluids to help flush the contrast  material out of your system.  You will be asked to remove all metal, including: Watch, jewelry, and other metal objects including hearing aids, hair pieces and dentures. Also wearable glucose monitoring systems (ie. Freestyle Libre and Omnipods) (Braces and fillings normally are not a problem.)   TEST WILL TAKE APPROXIMATELY 1 HOUR  PLEASE NOTIFY SCHEDULING AT LEAST 24 HOURS IN ADVANCE IF YOU ARE UNABLE TO KEEP YOUR APPOINTMENT. [phone]  For more information and frequently asked questions, please visit our website : [email or web address]  Please call Rockwell Alexandria, cardiac imaging nurse navigator with any questions/concerns. Rockwell Alexandria RN Navigator Cardiac Imaging Larey Brick RN Navigator Cardiac Imaging [facility] [phone] Office    Follow-Up in: 1 MONTH  At the [facility], you and your health needs are our priority. We have a designated team specialized in the treatment of Heart Failure. This Care Team includes your primary Heart Failure Specialized Cardiologist (physician), Advanced Practice Providers (APPs- Physician Assistants and Nurse Practitioners), and Pharmacist who all work together to provide you with the care you need, when you need it.   You may see any of the following providers on your designated Care Team at your next follow up:  Dr. Arvilla Meres Dr. Marca Ancona Dr. Marcos Eke, NP Robbie Lis, [place] [facility] [place], [place] Brynda Peon, NP Karle Plumber, PharmD   Please be sure to bring in all your medications bottles to every appointment.   Need to Contact us:  If you have any  questions or concerns before your next appointment please send [place] a message through [place] or call our office at [phone].    TO LEAVE A MESSAGE FOR THE NURSE SELECT OPTION 2, PLEASE LEAVE A MESSAGE INCLUDING: YOUR NAME DATE OF BIRTH CALL BACK NUMBER REASON FOR CALL**this  is important as we prioritize the call backs  YOU WILL RECEIVE A CALL BACK THE SAME DAY AS LONG AS YOU CALL BEFORE 4:00 PM

## 2023-05-07 ENCOUNTER — Telehealth: Payer: Self-pay

## 2023-05-07 MED ORDER — POTASSIUM CHLORIDE CRYS ER 20 MEQ PO TBCR: 20.0000 meq | EXTENDED_RELEASE_TABLET | Freq: Every day | ORAL | 1 refills | Status: DC

## 2023-05-07 NOTE — Telephone Encounter (Signed)
Called patient left detailed message per [facility] regarding her kidney function test and sent in potassium chloride. Also left message to call back if she had any questions. Sent copy to PCP

## 2023-05-07 NOTE — Telephone Encounter (Signed)
-----   Message from Vishnu P Mallipeddi sent at 05/06/2023  2:24 PM EDT ----- Stable serum creatinine. K 3.1. Start Kcl 20 mEq once daily.

## 2023-05-12 NOTE — Progress Notes (Signed)
[facility] [address] [place] [address]   Clinic Day:  05/12/2023  Referring physician: Marjo Bicker, MD  Patient Care Team: Smith Robert, MD as PCP - General (Family Medicine) Mallipeddi, Orion Modest, MD as PCP - Cardiology (Cardiology)   ASSESSMENT & PLAN:   Assessment: ***  Plan: ***  No orders of the defined types were placed in this encounter.     Summer Christensen,acting as a Neurosurgeon for Doreatha Massed, MD.,have documented all relevant documentation on the behalf of Doreatha Massed, MD,as directed by  Doreatha Massed, MD while in the presence of Doreatha Massed, MD.   ***  [place] R Christensen   7/23/20245:12 PM  CHIEF COMPLAINT/PURPOSE OF CONSULT:   Diagnosis: ***  Current Therapy:  ***  HISTORY OF PRESENT ILLNESS:   Summer Christensen is a 72 y.o. female presenting to clinic today for evaluation of DVT at the request of Mallipeddi, Vishnu P, MD.  Patient was diagnosed with new onset cardiomyopathy with LVEF less than 20% when she was admitted to [facility] in 07/2022 with severe sepsis secondary to pneumonia. She was readmitted again in 2 weeks and was diagnosed with LUL pulmonary embolus and acute DVT of her right leg for which she is currently on Eliquis.   She was presented to the ED at [facility] on 04/19/23 for SOB and chest pain. EKG found no significant change from prior, no significant blood count or electrolyte disturbance, troponin negative x 2, D-dimer negative, and Chest x-ray normal. Symptoms improved and she was discharged the same day.   Today, she states that she is doing well overall. Her appetite level is at ***%. Her energy level is at ***%.  ***She was found to have abnormal CBC from *** ***She denies recent chest pain on exertion, shortness of breath on minimal exertion, pre-syncopal episodes, or palpitations. ***She had not noticed any recent bleeding such as epistaxis, hematuria or  hematochezia ***The patient denies over the counter NSAID ingestion. She is not *** on antiplatelets agents. Her last colonoscopy was *** ***She had no prior history or diagnosis of cancer. She denies any family history of cancer.  *** Her age appropriate screening programs are up-to-date. ***She denies any pica and eats a variety of diet. ***She never donated blood or received blood transfusion. 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   ROTATOR CUFF REPAIR  2009   arthroscopic   TUBAL LIGATION     bilateral    Social History: Social History   Socioeconomic History   Marital status: Divorced    Spouse name: Not on file   Number of children: 3   Years of education: Not on file   Highest education level: Not on file  Occupational History   Occupation: post office clerk  Employer: [employer]  Tobacco Use   Smoking status: Never    Passive exposure: Never   Smokeless tobacco: Never  Vaping Use   Vaping status: Never Used  Substance and Sexual Activity   Alcohol use: No   Drug use: No   Sexual activity: Never    Birth control/protection: Surgical    Comment: hyst  Other Topics Concern   Not on file  Social History Narrative   Not on file   Social Determinants of Health   Financial Resource Strain: Not on file  Food Insecurity: No Food Insecurity (08/11/2022)    Hunger Vital Sign    Worried About Running Out of Food in the Last Year: Never true    Ran Out of Food in the Last Year: Never true  Transportation Needs: No Transportation Needs (08/11/2022)   PRAPARE - Administrator, Civil Service (Medical): No    Lack of Transportation (Non-Medical): No  Physical Activity: Not on file  Stress: Not on file  Social Connections: Not on file  Intimate Partner Violence: Not At Risk (08/11/2022)   Humiliation, Afraid, Rape, and Kick questionnaire    Fear of Current or Ex-Partner: No    Emotionally Abused: No    Physically Abused: No    Sexually Abused: No    Family History: Family History  Problem Relation Age of Onset   Stroke Mother    Heart attack Father     Current Medications:  Current Outpatient Medications:    apixaban (ELIQUIS) 5 MG TABS tablet, Take 1 tablet twice daily, Disp: 60 tablet, Rfl: 5   carvedilol (COREG) 12.5 MG tablet, Take 1 tablet (12.5 mg total) by mouth 2 (two) times daily., Disp: 180 tablet, Rfl: 1   digoxin (LANOXIN) 0.125 MG tablet, Take 1 tablet (0.125 mg total) by mouth daily., Disp: 30 tablet, Rfl: 3   Furosemide (FUROSCIX) 80 MG/10ML CTKT, Inject 80 mg into the skin as needed. (Patient not taking: Reported on 05/04/2023), Disp: 2 each, Rfl: 0   furosemide (LASIX) 40 MG tablet, Take 1 tablet (40 mg total) by mouth 2 (two) times daily., Disp: 180 tablet, Rfl: 3   pantoprazole (PROTONIX) 40 MG tablet, Take 1 tablet (40 mg total) by mouth daily., Disp: 30 tablet, Rfl: 2   potassium chloride SA (KLOR-CON M) 20 MEQ tablet, Take 1 tablet (20 mEq total) by mouth daily., Disp: 90 tablet, Rfl: 1   Saccharomyces boulardii (PROBIOTIC) 250 MG CAPS, Take 1 capsule by mouth daily., Disp: , Rfl:    sacubitril-valsartan (ENTRESTO) 97-103 MG, Take 1 tablet by mouth 2 (two) times daily., Disp: 60 tablet, Rfl: 6   spironolactone (ALDACTONE) 25 MG tablet, Take 1 tablet (25 mg total) by mouth daily., Disp: 90 tablet, Rfl: 3    Allergies: Allergies  Allergen Reactions   Jardiance [Empagliflozin] Itching and Swelling    REVIEW OF SYSTEMS:   Review of Systems  Constitutional:  Negative for chills, fatigue and fever.  HENT:   Negative for lump/mass, mouth sores, nosebleeds, sore throat and trouble swallowing.   Eyes:  Negative for eye problems.  Respiratory:  Negative for cough and shortness of breath.   Cardiovascular:  Negative for chest pain, leg swelling and palpitations.  Gastrointestinal:  Negative for abdominal pain, constipation, diarrhea, nausea and vomiting.  Genitourinary:  Negative for bladder incontinence, difficulty urinating, dysuria, frequency, hematuria and nocturia.   Musculoskeletal:  Negative for arthralgias, back pain, flank pain, myalgias and neck pain.  Skin:  Negative  for itching and rash.  Neurological:  Negative for dizziness, headaches and numbness.  Hematological:  Does not bruise/bleed easily.  Psychiatric/Behavioral:  Negative for depression, sleep disturbance and suicidal ideas. The patient is not nervous/anxious.   All other systems reviewed and are negative.    VITALS:   There were no vitals taken for this visit.  Wt Readings from Last 3 Encounters:  05/04/23 149 lb (67.6 kg)  04/27/23 147 lb 3.2 oz (66.8 kg)  04/19/23 140 lb (63.5 kg)    There is no height or weight on file to calculate BMI.   PHYSICAL EXAM:   Physical Exam Vitals and nursing note reviewed. Exam conducted with a chaperone present.  Constitutional:      Appearance: Normal appearance.  Cardiovascular:     Rate and Rhythm: Normal rate and regular rhythm.     Pulses: Normal pulses.     Heart sounds: Normal heart sounds.  Pulmonary:     Effort: Pulmonary effort is normal.     Breath sounds: Normal breath sounds.  Abdominal:     Palpations: Abdomen is soft. There is no hepatomegaly, splenomegaly or mass.     Tenderness: There is no abdominal tenderness.  Musculoskeletal:     Right lower leg:  No edema.     Left lower leg: No edema.  Lymphadenopathy:     Cervical: No cervical adenopathy.     Right cervical: No superficial, deep or posterior cervical adenopathy.    Left cervical: No superficial, deep or posterior cervical adenopathy.     Upper Body:     Right upper body: No supraclavicular or axillary adenopathy.     Left upper body: No supraclavicular or axillary adenopathy.  Neurological:     General: No focal deficit present.     Mental Status: She is alert and oriented to person, place, and time.  Psychiatric:        Mood and Affect: Mood normal.        Behavior: Behavior normal.     LABS:      Latest Ref Rng & Units 04/19/2023    3:36 AM 08/22/2022    2:58 PM 08/14/2022    3:37 AM  CBC  WBC 4.0 - 10.5 K/uL 12.1  20.0  15.7   Hemoglobin 12.0 - 15.0 g/dL 16.1  09.6  04.5   Hematocrit 36.0 - 46.0 % 37.5  35.8  30.9   Platelets 150 - 400 K/uL 243  414  410       Latest Ref Rng & Units 05/04/2023    9:08 AM 04/19/2023    3:36 AM 04/16/2023    8:34 AM  CMP  Glucose 70 - 99 mg/dL 409  811  914   BUN 8 - 23 mg/dL 14  23  17    Creatinine 0.44 - 1.00 mg/dL 7.82  9.56  2.13   Sodium 135 - 145 mmol/L 139  138  138   Potassium 3.5 - 5.1 mmol/L 3.1  3.5  3.6   Chloride 98 - 111 mmol/L 103  100  102   CO2 22 - 32 mmol/L 26  26  25    Calcium 8.9 - 10.3 mg/dL 9.6  9.7  9.6      No results found for: "CEA1", "CEA" / No results found for: "CEA1", "CEA" No results found for: "PSA1" No results found for: "YQM578" No results found for: "CAN125"  No results found for: "TOTALPROTELP", "ALBUMINELP", "A1GS", "A2GS", "BETS", "BETA2SER", "GAMS", "MSPIKE", "SPEI" No results  found for: "TIBC", "FERRITIN", "IRONPCTSAT" No results found for: "LDH"   STUDIES:   DG Chest 2 View  Result Date: 04/19/2023 CLINICAL DATA:  Dyspnea, chest pain EXAM: CHEST - 2 VIEW COMPARISON:  09/04/2022 FINDINGS: The lungs are symmetrically expanded. Bibasilar linear scarring noted. No confluent  pulmonary infiltrate. No pneumothorax or pleural effusion. Cardiac size within normal limits. Pulmonary vascularity is normal. No acute bone abnormality. IMPRESSION: 1. No active cardiopulmonary disease. Electronically Signed   By: Helyn Numbers M.D.   On: 04/19/2023 04:04

## 2023-05-13 ENCOUNTER — Inpatient Hospital Stay: Payer: Medicare HMO | Attending: Hematology | Admitting: Hematology

## 2023-05-13 ENCOUNTER — Encounter: Payer: Self-pay | Admitting: Hematology

## 2023-05-13 ENCOUNTER — Inpatient Hospital Stay: Payer: Medicare HMO

## 2023-05-13 VITALS — BP 108/60 | HR 70 | Temp 98.2°F | Resp 16 | Ht 64.0 in | Wt 147.9 lb

## 2023-05-13 DIAGNOSIS — D72828 Other elevated white blood cell count: Secondary | ICD-10-CM | POA: Diagnosis not present

## 2023-05-13 DIAGNOSIS — I82401 Acute embolism and thrombosis of unspecified deep veins of right lower extremity: Secondary | ICD-10-CM | POA: Diagnosis not present

## 2023-05-13 DIAGNOSIS — Z86718 Personal history of other venous thrombosis and embolism: Secondary | ICD-10-CM | POA: Insufficient documentation

## 2023-05-13 DIAGNOSIS — Z7901 Long term (current) use of anticoagulants: Secondary | ICD-10-CM | POA: Insufficient documentation

## 2023-05-13 DIAGNOSIS — Z86711 Personal history of pulmonary embolism: Secondary | ICD-10-CM | POA: Insufficient documentation

## 2023-05-13 DIAGNOSIS — Z79899 Other long term (current) drug therapy: Secondary | ICD-10-CM | POA: Diagnosis not present

## 2023-05-13 DIAGNOSIS — I2699 Other pulmonary embolism without acute cor pulmonale: Secondary | ICD-10-CM | POA: Diagnosis not present

## 2023-05-13 DIAGNOSIS — I82409 Acute embolism and thrombosis of unspecified deep veins of unspecified lower extremity: Secondary | ICD-10-CM

## 2023-05-13 LAB — D-DIMER, QUANTITATIVE: D-Dimer, Quant: <0.27 ug/mL-FEU (ref 0.00–0.50)

## 2023-05-13 LAB — TSH: TSH: 0.711 u[IU]/mL (ref 0.350–4.500)

## 2023-05-13 NOTE — Patient Instructions (Signed)
You were seen and examined today by Dr. Ellin Saba. Dr. Ellin Saba is a hematologist, meaning that he specializes in blood abnormalities. Dr. Ellin Saba discussed your past medical history, family history of cancers/blood conditions and the events that led to you being here today.  You were referred to Dr. Ellin Saba due to history of blood clots and management of your blood thinner.   Dr. Ellin Saba has recommended additional labs today for further evaluation.  Follow-up as scheduled.

## 2023-05-14 ENCOUNTER — Inpatient Hospital Stay: Payer: 59 | Admitting: Hematology

## 2023-05-14 LAB — LUPUS ANTICOAGULANT PANEL
DRVVT: 77.4 s — ABNORMAL HIGH (ref 0.0–47.0)
PTT Lupus Anticoagulant: 40.8 s (ref 0.0–43.5)

## 2023-05-14 LAB — DRVVT CONFIRM: dRVVT Confirm: 1.2 ratio (ref 0.8–1.2)

## 2023-05-18 ENCOUNTER — Other Ambulatory Visit: Payer: Self-pay

## 2023-05-18 ENCOUNTER — Emergency Department (HOSPITAL_COMMUNITY)
Admission: EM | Admit: 2023-05-18 | Discharge: 2023-05-19 | Disposition: A | Discharge status: Home / Self Care | Payer: Medicare HMO | Attending: Emergency Medicine | Admitting: Emergency Medicine

## 2023-05-18 ENCOUNTER — Emergency Department (HOSPITAL_COMMUNITY): Payer: Medicare HMO

## 2023-05-18 ENCOUNTER — Encounter (HOSPITAL_COMMUNITY): Payer: Self-pay

## 2023-05-18 DIAGNOSIS — E871 Hypo-osmolality and hyponatremia: Secondary | ICD-10-CM | POA: Diagnosis not present

## 2023-05-18 DIAGNOSIS — Z79899 Other long term (current) drug therapy: Secondary | ICD-10-CM | POA: Insufficient documentation

## 2023-05-18 DIAGNOSIS — N289 Disorder of kidney and ureter, unspecified: Secondary | ICD-10-CM | POA: Insufficient documentation

## 2023-05-18 DIAGNOSIS — R072 Precordial pain: Secondary | ICD-10-CM | POA: Insufficient documentation

## 2023-05-18 DIAGNOSIS — R42 Dizziness and giddiness: Secondary | ICD-10-CM | POA: Diagnosis present

## 2023-05-18 DIAGNOSIS — Z7901 Long term (current) use of anticoagulants: Secondary | ICD-10-CM | POA: Diagnosis not present

## 2023-05-18 DIAGNOSIS — I110 Hypertensive heart disease with heart failure: Secondary | ICD-10-CM | POA: Insufficient documentation

## 2023-05-18 DIAGNOSIS — I5030 Unspecified diastolic (congestive) heart failure: Secondary | ICD-10-CM | POA: Insufficient documentation

## 2023-05-18 DIAGNOSIS — R079 Chest pain, unspecified: Secondary | ICD-10-CM

## 2023-05-18 LAB — CBC WITH DIFFERENTIAL/PLATELET
Abs Immature Granulocytes: 0.03 10*3/uL (ref 0.00–0.07)
Basophils Absolute: 0 10*3/uL (ref 0.0–0.1)
Basophils Relative: 0 %
Eosinophils Absolute: 0.1 10*3/uL (ref 0.0–0.5)
Eosinophils Relative: 1 %
HCT: 37 % (ref 36.0–46.0)
Hemoglobin: 12.6 g/dL (ref 12.0–15.0)
Immature Granulocytes: 0 %
Lymphocytes Relative: 23 %
Lymphs Abs: 2.7 10*3/uL (ref 0.7–4.0)
MCH: 28.8 pg (ref 26.0–34.0)
MCHC: 34.1 g/dL (ref 30.0–36.0)
MCV: 84.5 fL (ref 80.0–100.0)
Monocytes Absolute: 0.7 10*3/uL (ref 0.1–1.0)
Monocytes Relative: 6 %
Neutro Abs: 8.1 10*3/uL — ABNORMAL HIGH (ref 1.7–7.7)
Neutrophils Relative %: 70 %
Platelets: 238 10*3/uL (ref 150–400)
RBC: 4.38 MIL/uL (ref 3.87–5.11)
RDW: 12.4 % (ref 11.5–15.5)
WBC: 11.6 10*3/uL — ABNORMAL HIGH (ref 4.0–10.5)
nRBC: 0 % (ref 0.0–0.2)

## 2023-05-18 LAB — COMPREHENSIVE METABOLIC PANEL
ALT: 29 U/L (ref 0–44)
AST: 28 U/L (ref 15–41)
Albumin: 4.1 g/dL (ref 3.5–5.0)
Alkaline Phosphatase: 63 U/L (ref 38–126)
Anion gap: 7 (ref 5–15)
BUN: 19 mg/dL (ref 8–23)
CO2: 22 mmol/L (ref 22–32)
Calcium: 9.9 mg/dL (ref 8.9–10.3)
Chloride: 105 mmol/L (ref 98–111)
Creatinine, Ser: 1.27 mg/dL — ABNORMAL HIGH (ref 0.44–1.00)
GFR, Estimated: 45 mL/min — ABNORMAL LOW (ref >60)
Glucose, Bld: 146 mg/dL — ABNORMAL HIGH (ref 70–99)
Potassium: 3.6 mmol/L (ref 3.5–5.1)
Sodium: 134 mmol/L — ABNORMAL LOW (ref 135–145)
Total Bilirubin: 0.7 mg/dL (ref 0.3–1.2)
Total Protein: 7.2 g/dL (ref 6.5–8.1)

## 2023-05-18 LAB — BRAIN NATRIURETIC PEPTIDE: B Natriuretic Peptide: 93 pg/mL (ref 0.0–100.0)

## 2023-05-18 NOTE — ED Triage Notes (Signed)
Pt brought in by [facility] for dizziness and weakness progressively getting worse. Pt states hx of CHF, and having dizziness, and LBB noted to EKG that is not new and suppose to go next week to have a pace maker placed. Pt states having CP earlier today but has subsided. Pt states dizziness sitting, standing, or any way she sits around.

## 2023-05-19 LAB — TROPONIN I (HIGH SENSITIVITY): Troponin I (High Sensitivity): 16 ng/L (ref <18)

## 2023-05-19 NOTE — ED Provider Notes (Signed)
[place] [facility] AT [facility] Provider Note   CSN: [id number] Arrival date & time: 05/18/23  1834     History  Chief Complaint  Patient presents with   Dizziness    Summer Christensen is a 72 y.o. female.  The history is provided by the patient.  Dizziness She has history of hypertension, hyperlipidemia, heart failure with preserved ejection fraction, pulmonary embolism anticoagulated on apixaban and comes in after an episode of chest pain this afternoon followed by dizziness.  Chest pain is described as a hard pain in her midsternal area without radiation.  There is no associated nausea or vomiting or diaphoresis.  There is no change in chronic dyspnea.  Pain lasted about 20 minutes before resolving.  Dizziness was described as a feeling like she was going to pass out.  She initially felt dizzy while laying flat, but then it eased to where she only felt dizzy when she sits up or stands up.  She cannot tell if the dizziness is better now because she is laying in bed.   Home Medications Prior to Admission medications   Medication Sig Start Date End Date Taking? Authorizing Provider  apixaban (ELIQUIS) 5 MG TABS tablet Take 1 tablet twice daily 11/19/22   Mallipeddi, Vishnu P, MD  carvedilol (COREG) 12.5 MG tablet Take 1 tablet (12.5 mg total) by mouth 2 (two) times daily. 09/29/22   Mallipeddi, Vishnu P, MD  digoxin (LANOXIN) 0.125 MG tablet Take 1 tablet (0.125 mg total) by mouth daily. 05/04/23   Sabharwal, Aditya, DO  Furosemide (FUROSCIX) 80 MG/10ML CTKT Inject 80 mg into the skin as needed. 04/13/23   Mallipeddi, Vishnu P, MD  furosemide (LASIX) 40 MG tablet Take 1 tablet (40 mg total) by mouth 2 (two) times daily. 04/13/23 07/12/23  Mallipeddi, Vishnu P, MD  pantoprazole (PROTONIX) 40 MG tablet Take 1 tablet (40 mg total) by mouth daily. 08/05/22   Vassie Loll, MD  potassium chloride SA (KLOR-CON M) 20 MEQ tablet Take 1 tablet (20 mEq total) by mouth daily.  05/07/23   Mallipeddi, Vishnu P, MD  Saccharomyces boulardii (PROBIOTIC) 250 MG CAPS Take 1 capsule by mouth daily. 06/24/22   [provider]  sacubitril-valsartan (ENTRESTO) 97-103 MG Take 1 tablet by mouth 2 (two) times daily. 05/04/23   Sabharwal, Aditya, DO  spironolactone (ALDACTONE) 25 MG tablet Take 1 tablet (25 mg total) by mouth daily. 11/19/22   Mallipeddi, Orion Modest, MD      Allergies    Jardiance [empagliflozin]    Review of Systems   Review of Systems  Neurological:  Positive for dizziness.  All other systems reviewed and are negative.   Physical Exam Updated Vital Signs BP 137/63 (BP Location: Right Arm)   Pulse 63   Temp 98 F (36.7 C) (Oral)   Resp 18   Ht 5\' 4"  (1.626 m)   Wt 67.1 kg   SpO2 97%   BMI 25.38 kg/m  Physical Exam Vitals and nursing note reviewed.   72 year old female, resting comfortably and in no acute distress. Vital signs are normal. Oxygen saturation is 97%, which is normal. Head is normocephalic and atraumatic. PERRLA, EOMI. Oropharynx is clear. Neck is nontender and supple without adenopathy or JVD. Back is nontender and there is no CVA tenderness. Lungs are clear without rales, wheezes, or rhonchi. Chest is nontender. Heart has regular rate and rhythm without murmur. Abdomen is soft, flat, nontender. Extremities have no cyanosis or edema, full range  of motion is present. Skin is warm and dry without rash. Neurologic: Mental status is normal, cranial nerves are intact, moves all extremities equally.  Dizziness is not reproduced by passive head movement.  ED Results / Procedures / Treatments   Labs (all labs ordered are listed, but only abnormal results are displayed) Labs Reviewed  CBC WITH DIFFERENTIAL/PLATELET - Abnormal; Notable for the following components:      Result Value   WBC 11.6 (*)    Neutro Abs 8.1 (*)    All other components within normal limits  COMPREHENSIVE METABOLIC PANEL - Abnormal; Notable for the following  components:   Sodium 134 (*)    Glucose, Bld 146 (*)    Creatinine, Ser 1.27 (*)    GFR, Estimated 45 (*)    All other components within normal limits  BRAIN NATRIURETIC PEPTIDE  TROPONIN I (HIGH SENSITIVITY)    EKG None  Radiology DG Chest Port 1 View  Result Date: 05/18/2023 CLINICAL DATA:  Dizziness and weakness. EXAM: PORTABLE CHEST 1 VIEW COMPARISON:  April 19, 2023 FINDINGS: The heart size and mediastinal contours are within normal limits. Mild, stable linear scarring and/or atelectasis is seen within the lateral aspect of the left lung base. There is no evidence of acute infiltrate, pleural effusion or pneumothorax. Radiopaque surgical clips are seen within the right upper quadrant. The visualized skeletal structures are unremarkable. IMPRESSION: Mild left basilar linear scarring and/or atelectasis. Electronically Signed   By: Aram Candela M.D.   On: 05/18/2023 21:30    Procedures Procedures  Cardiac monitor shows normal sinus rhythm, per my interpretation.  Medications Ordered in ED Medications - No data to display  ED Course/ Medical Decision Making/ A&P                             Medical Decision Making  Episode of chest pain and dizziness.  She does have risk factors for coronary artery disease, I have ordered an electrocardiogram and troponin to rule out ACS.  She is now 11 hours after her symptoms started, a single troponin is sufficient to rule out ACS.  Cause of dizziness is unclear.  This does not sound like vertigo.  I have reviewed and interpreted her labs which were ordered at triage, and my interpretation is mild hyponatremia which is not felt to be clinically significant, elevated random glucose in a range that she has been at previously, renal insufficiency not significantly changed from baseline, mild leukocytosis which is nonspecific and without a left shift, normal hemoglobin.  I have ordered orthostatic vital signs to be checked.  I have reviewed her past  records, and coronary CT angiogram on 09/23/2022 showed mild nonobstructive coronary artery disease with no occlusions greater than 49%, ACS is unlikely today.  Troponin is normal.  I have reviewed and interpreted her electrocardiogram, my interpretation is left bundle branch block unchanged from prior.  Orthostatic vital signs showed no significant change in pulse or blood pressure and patient denies being symptomatic when standing up.  I feel she is safe for discharge, she needs to follow-up with her cardiologist.  Return precautions discussed.  Final Clinical Impression(s) / ED Diagnoses Final diagnoses:  Dizziness  Nonspecific chest pain  Hyponatremia  Renal insufficiency  Chronic anticoagulation    Rx / DC Orders ED Discharge Orders     None         Dione Booze, MD 05/19/23 [phone]

## 2023-05-19 NOTE — Discharge Instructions (Addendum)
Your evaluation today did not show any serious issues.  You do need to follow-up with your cardiologist.  Return if you have any new or concerning symptoms.

## 2023-05-20 ENCOUNTER — Ambulatory Visit (HOSPITAL_COMMUNITY)
Admission: RE | Admit: 2023-05-20 | Discharge: 2023-05-20 | Disposition: A | Discharge status: Home / Self Care | Payer: Medicare HMO | Source: Ambulatory Visit | Attending: Hematology | Admitting: Hematology

## 2023-05-20 DIAGNOSIS — I82409 Acute embolism and thrombosis of unspecified deep veins of unspecified lower extremity: Secondary | ICD-10-CM | POA: Insufficient documentation

## 2023-05-22 ENCOUNTER — Ambulatory Visit: Payer: 59 | Admitting: Cardiovascular Disease

## 2023-05-28 NOTE — Progress Notes (Signed)
ADVANCED HEART FAILURE CLINIC NOTE  Referring Physician: Smith Robert, MD  Primary Care: Smith Robert, MD Primary Cardiologist: Dr. Jenene Slicker HF: Dr. Gasper Lloyd  HPI: Summer Christensen is a 72 y.o. female with hypertension, nonischemic cardiomyopathy with EF 20 to 25%, history of VTE with PE in October 2023 presenting today for follow up.  She was diagnosed with heart failure at [facility] in October 2023 when admitted for septic shock secondary to pneumonia.  She was readmitted 2 weeks after with left upper lobe PE and acute DVT in the right leg and subsequently started on Eliquis.  Follow-up coronary CTA with coronary calcium score of 53.  During this time GDMT was uptitrated with repeat TTE demonstrating EF of 20 to 25%.  Since that time she has continued to follow with general cardiology where she has remained fairly dyspneic and fatigued.  Interval hx:  Continues to become very quickly fatigued. She reports that she is unable to walk >30-32ft before needing to stop due to fatigue. No significant dyspnea or LE edema. Depressed affect.   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 180 tablet 1   digoxin (LANOXIN) 0.125 MG tablet Take 1 tablet (0.125 mg total) by mouth daily. 30 tablet 3   Furosemide (FUROSCIX) 80 MG/10ML CTKT Inject 80 mg into the skin as  needed. 2 each 0   furosemide (LASIX) 40 MG tablet Take 1 tablet (40 mg total) by mouth 2 (two) times daily. 180 tablet 3   pantoprazole (PROTONIX) 40 MG tablet Take 1 tablet (40 mg total) by mouth daily. 30 tablet 2   potassium chloride SA (KLOR-CON M) 20 MEQ tablet Take 1 tablet (20 mEq total) by mouth daily. 90 tablet 1   Saccharomyces boulardii (PROBIOTIC) 250 MG CAPS Take 1 capsule by mouth daily.     sacubitril-valsartan (ENTRESTO) 97-103 MG Take 1 tablet by mouth 2 (two) times daily. 60 tablet 6   spironolactone (ALDACTONE) 25 MG tablet Take 1 tablet (25 mg total) by mouth daily. 90 tablet 3   No current facility-administered medications for this visit.    Allergies  Allergen Reactions   Jardiance [Empagliflozin] Itching and Swelling      Social History   Socioeconomic History   Marital status: Divorced    Spouse name: Not on file   Number of children: 3   Years of education: Not on file   Highest education level: Not on file  Occupational History   Occupation: post office clerk    Employer: [employer]  Tobacco Use   Smoking status: Never    Passive exposure: Never   Smokeless tobacco: Never  Vaping Use   Vaping status: Never Used  Substance and Sexual Activity   Alcohol use: No   Drug use: No   Sexual activity:  Never    Birth control/protection: Surgical    Comment: hyst  Other Topics Concern   Not on file  Social History Narrative   Not on file   Social Determinants of Health   Financial Resource Strain: Not on file  Food Insecurity: No Food Insecurity (05/13/2023)   Hunger Vital Sign    Worried About Running Out of Food in the Last Year: Never true    Ran Out of Food in the Last Year: Never true  Transportation Needs: No Transportation Needs (05/13/2023)   PRAPARE - Administrator, Civil Service (Medical): No    Lack of Transportation (Non-Medical): No  Physical Activity: Not on file  Stress: Not on file  Social Connections: Not on  file  Intimate Partner Violence: Not At Risk (05/13/2023)   Humiliation, Afraid, Rape, and Kick questionnaire    Fear of Current or Ex-Partner: No    Emotionally Abused: No    Physically Abused: No    Sexually Abused: No      Family History  Problem Relation Age of Onset   Stroke Mother    Heart attack Father     PHYSICAL EXAM: Vitals:   05/29/23 0929  BP: 112/70  Pulse: 70  SpO2: 97%   GENERAL: Well nourished, well developed, and in no apparent distress at rest.  HEENT: Negative for arcus senilis or xanthelasma. There is no scleral icterus.  The mucous membranes are pink and moist.   NECK: Supple, No masses. Normal carotid upstrokes without bruits. No masses or thyromegaly.    CHEST: There are no chest wall deformities. There is no chest wall tenderness. Respirations are unlabored.  Lungs- CTA B/L CARDIAC:  JVP: 7 cm          Normal rate with regular rhythm. No murmurs, rubs or gallops.  Pulses are 2+ and symmetrical in upper and lower extremities. No edema.  ABDOMEN: Soft, non-tender, non-distended. There are no masses or hepatomegaly. There are normal bowel sounds.  EXTREMITIES: Warm and well perfused with no cyanosis, clubbing.  LYMPHATIC: No axillary or supraclavicular lymphadenopathy.  NEUROLOGIC: Patient is oriented x3 with no focal or lateralizing neurologic deficits.  PSYCH: Patients affect is appropriate, there is no evidence of anxiety or depression.  SKIN: Warm and dry; no lesions or wounds.   DATA REVIEW  ECG: 12/21/11: NSR w/ LBBB  as per my personal interpretation 04/19/23: NSR w/ LBBB as per my read  ECHO: 02/20/23: LVEF 25% with intraventricular dyssynchrony due to LBBB, normal RV function as per my personal interpretation  CATH: N/A  Coronary CTA: 09/23/22 1. Coronary calcium score of 53.8. This was 19 percentile for age-, sex, and race-matched controls.  2. Normal coronary origin with right dominance.  3. Mild (25-49) calcified plaque in the ostial  LAD.  4. Aortic atherosclerosis.  5. Left ventricular enlargement.   ASSESSMENT & PLAN:  Heart failure with reduced ejection fraction Etiology of HF: Nonischemic cardiomyopathy with LBBB. Plan for CMR to re-assess LV function and degree of LGE. She has had a LBBB on EKGs dating back to 2013 NYHA class / AHA Stage:III;  Volume status & Diuretics: Euvolemic Vasodilators:Entresto 97/103mg  BID; was taking 97/103mg  BID in addition to her prior 49/51mg  BID tablets.  Beta-Blocker:continue digoxin daily,D/C in 1 week. Continue coreg 12.5mg  BID.  ZOX:[id number] 25mg  daily.  Cardiometabolic:jardiance held due to UTI after starting it. Devices therapies & Valvulopathies:Appt with Dr. Nelly Laurence itoday. Will require biventricular pacing; I suspect her MR will improvement with  GDMT & CRT-D Advanced therapies:Not currently indicated, however, low threshold for RHC. Bedside TTE today with improvement in LVEF (?40%), however, continues to have significant LBBB induced dyssynchrony.   2. Moderate to severe MR - Functional MR - Will titrate GDMT; plan for CRT-D prior to consideration for TEER.   3. Hyperlipidemia - Lipitor 40mg   4. Left upper lobe PE, B/L DVTs - started on AC in 07/2022.  - Likely provoked PE/DVT after admission for septic shock and prolonged time in bed.  - D/C apixaban at follow up.   5. 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      Advanced Heart Failure Mechanical Circulatory Support

## 2023-05-29 ENCOUNTER — Encounter: Payer: Self-pay | Admitting: Cardiovascular Disease

## 2023-05-29 ENCOUNTER — Encounter (HOSPITAL_COMMUNITY): Payer: Self-pay | Admitting: Cardiology

## 2023-05-29 ENCOUNTER — Ambulatory Visit
Admission: RE | Admit: 2023-05-29 | Discharge: 2023-05-29 | Disposition: A | Discharge status: Home / Self Care | Payer: Medicare HMO | Source: Ambulatory Visit | Attending: Cardiology | Admitting: Cardiology

## 2023-05-29 ENCOUNTER — Ambulatory Visit: Payer: Medicare HMO | Admitting: Cardiovascular Disease

## 2023-05-29 VITALS — BP 112/70 | HR 70 | Wt 147.2 lb

## 2023-05-29 VITALS — BP 126/70 | HR 70 | Ht 64.0 in | Wt 147.6 lb

## 2023-05-29 DIAGNOSIS — I824Z1 Acute embolism and thrombosis of unspecified deep veins of right distal lower extremity: Secondary | ICD-10-CM | POA: Diagnosis not present

## 2023-05-29 DIAGNOSIS — I447 Left bundle-branch block, unspecified: Secondary | ICD-10-CM | POA: Insufficient documentation

## 2023-05-29 DIAGNOSIS — R160 Hepatomegaly, not elsewhere classified: Secondary | ICD-10-CM | POA: Insufficient documentation

## 2023-05-29 DIAGNOSIS — E785 Hyperlipidemia, unspecified: Secondary | ICD-10-CM | POA: Diagnosis not present

## 2023-05-29 DIAGNOSIS — I5022 Chronic systolic (congestive) heart failure: Secondary | ICD-10-CM | POA: Diagnosis not present

## 2023-05-29 DIAGNOSIS — I340 Nonrheumatic mitral (valve) insufficiency: Secondary | ICD-10-CM | POA: Insufficient documentation

## 2023-05-29 DIAGNOSIS — I2699 Other pulmonary embolism without acute cor pulmonale: Secondary | ICD-10-CM | POA: Diagnosis not present

## 2023-05-29 DIAGNOSIS — I428 Other cardiomyopathies: Secondary | ICD-10-CM | POA: Insufficient documentation

## 2023-05-29 DIAGNOSIS — Z7901 Long term (current) use of anticoagulants: Secondary | ICD-10-CM | POA: Diagnosis not present

## 2023-05-29 DIAGNOSIS — Z8249 Family history of ischemic heart disease and other diseases of the circulatory system: Secondary | ICD-10-CM | POA: Insufficient documentation

## 2023-05-29 DIAGNOSIS — Z79899 Other long term (current) drug therapy: Secondary | ICD-10-CM | POA: Diagnosis not present

## 2023-05-29 DIAGNOSIS — I82401 Acute embolism and thrombosis of unspecified deep veins of right lower extremity: Secondary | ICD-10-CM | POA: Insufficient documentation

## 2023-05-29 DIAGNOSIS — I110 Hypertensive heart disease with heart failure: Secondary | ICD-10-CM | POA: Diagnosis not present

## 2023-05-29 LAB — BASIC METABOLIC PANEL
Anion gap: 12 (ref 5–15)
BUN: 16 mg/dL (ref 8–23)
CO2: 22 mmol/L (ref 22–32)
Calcium: 9.8 mg/dL (ref 8.9–10.3)
Chloride: 101 mmol/L (ref 98–111)
Creatinine, Ser: 1.19 mg/dL — ABNORMAL HIGH (ref 0.44–1.00)
GFR, Estimated: 49 mL/min — ABNORMAL LOW (ref >60)
Glucose, Bld: 327 mg/dL — ABNORMAL HIGH (ref 70–99)
Potassium: 4.3 mmol/L (ref 3.5–5.1)
Sodium: 135 mmol/L (ref 135–145)

## 2023-05-29 LAB — BRAIN NATRIURETIC PEPTIDE: B Natriuretic Peptide: 97.5 pg/mL (ref 0.0–100.0)

## 2023-05-29 LAB — DIGOXIN LEVEL: Digoxin Level: 0.7 ng/mL — ABNORMAL LOW (ref 0.8–2.0)

## 2023-05-29 MED ORDER — FUROSEMIDE 40 MG PO TABS: ORAL_TABLET | ORAL | 3 refills | Status: AC

## 2023-05-29 NOTE — Patient Instructions (Signed)
Medication Changes:  DECREASE LASIX TO 40MG  DAILY- MAY TAKE AN ADDITIONAL DOSE OF LASIX DAILY AS NEEDED FOR SWELLING   Lab Work:  Labs done today, your results will be available in MyChart, we will contact you for abnormal readings.  Testing/Procedures:  Your physician has requested that you have an echocardiogram. Echocardiography is a painless test that uses sound waves to create images of your heart. It provides your doctor with information about the size and shape of your heart and how well your heart's chambers and valves are working. This procedure takes approximately one hour. There are no restrictions for this procedure. Please do NOT wear cologne, perfume, aftershave, or lotions (deodorant is allowed). Please arrive 15 minutes prior to your appointment time.  Follow-Up in: 3 MONTHS PLEASE CALL OUR OFFICE AROUND MID SEPTEMBER TO GET SCHEDULED FOR YOUR APPOINTMENT. PHONE NUMBER IS [phone] OPTION 2   At the [facility], you and your health needs are our priority. We have a designated team specialized in the treatment of Heart Failure. This Care Team includes your primary Heart Failure Specialized Cardiologist (physician), Advanced Practice Providers (APPs- Physician Assistants and Nurse Practitioners), and Pharmacist who all work together to provide you with the care you need, when you need it.   You may see any of the following providers on your designated Care Team at your next follow up:  Dr. Arvilla Meres Dr. Marca Ancona Dr. Marcos Eke, NP Robbie Lis, [place] [facility] [place], [place] Brynda Peon, NP Karle Plumber, PharmD   Please be sure to bring in all your medications bottles to every appointment.   Need to Contact us:  If you have any questions or concerns before your next appointment please send [place] a message through [place] or call our office at [phone].    TO LEAVE A MESSAGE FOR THE NURSE SELECT OPTION 2,  PLEASE LEAVE A MESSAGE INCLUDING: YOUR NAME DATE OF BIRTH CALL BACK NUMBER REASON FOR CALL**this is important as we prioritize the call backs  YOU WILL RECEIVE A CALL BACK THE SAME DAY AS LONG AS YOU CALL BEFORE 4:00 PM

## 2023-05-29 NOTE — Progress Notes (Signed)
Electrophysiology Office Note:    Date:  05/29/2023   ID:  Summer Christensen, DOB 04-08-1951, MRN [id number]  PCP:  Summer Robert, MD   [facility] Providers Cardiologist:  Summer Bicker, MD     Referring MD: Summer Bicker, MD   History of Present Illness:    Summer Christensen is a 72 y.o. female with a medical history significant for hypertension, nonischemic cardiomyopathy with EF 20 to 25%, pulmonary embolism referred to discuss ICD placement.     She was diagnosed with heart failure in October 2023 when admitted for septic shock secondary to pneumonia.  Echocardiogram showed an EF of 20 to 25%.  This was complicated by a right lower extremity DVT and left upper lobe pulmonary embolism.  Eliquis was started and she has been following up in general cardiology clinic with Dr. Ella Christensen and heart failure clinic with Dr. Gasper Christensen.     Today, she reports that she continues to be fatigued and have very low energy.  EKGs/Labs/Other Studies Reviewed Today:    Echocardiogram:  TTE Feb 20, 2023 Ejection fraction 20 to 25% with dyssynchrony due to left bundle branch block  August 11, 2022 Ejection fraction 20 to 25%  TTE July 29, 2022 EF less than 20%.  Grade 3 diastolic dysfunction mild to moderate mitral regurgitation.   Monitors:   Stress testing:   Advanced imaging:  Coronary CTA September 23, 2022 1. Coronary calcium score of 53.8. This was 91 percentile for age-, sex, and race-matched controls.  2. Normal coronary origin with right dominance.  3. Mild (25-49) calcified plaque in the ostial LAD.  4. Aortic atherosclerosis.  5. Left ventricular enlargement.  Cardiac catherization    EKG:   EKG Interpretation Date/Time:  Friday May 29 2023 11:10:48 EDT Ventricular Rate:  70 PR Interval:  188 QRS Duration:  154 QT Interval:  428 QTC Calculation: 462 R Axis:   118  Text Interpretation: Normal sinus rhythm Right axis deviation Left  ventricular hypertrophy with QRS widening and repolarization abnormality ( Sokolow-Lyon , Cornell product ) When compared with ECG of 19-May-2023 01:02, PREVIOUS ECG IS PRESENT Confirmed by Summer Christensen [phone]) on 05/29/2023 11:47:41 AM     Physical Exam:    VS:  BP 126/70 (BP Location: Left Arm, Patient Position: Sitting, Cuff Size: Normal)   Pulse 70   Ht 5\' 4"  (1.626 m)   Wt 147 lb 9.6 oz (67 kg)   SpO2 95%   BMI 25.34 kg/m     Wt Readings from Last 3 Encounters:  05/29/23 147 lb 9.6 oz (67 kg)  05/29/23 147 lb 3.2 oz (66.8 kg)  05/18/23 147 lb 14.2 oz (67.1 kg)     GEN:  Well nourished, well developed in no acute distress CARDIAC: RRR, no murmurs, rubs, gallops RESPIRATORY:  Normal work of breathing MUSCULOSKELETAL: no edema    ASSESSMENT & PLAN:    CHFrEF, nonischemic cardiomyopathy Possibly left bundle branch block induced Per personal conversation with Dr. Gasper Christensen, EF was improved to 40% today on an informal echo She has a cardiac MRI pending --we will not place a device prior to this With depressed EF and true left on a ranch block, I think it appropriate to place a CRT device --will await MRI results to determine whether an ICD or pacemaker is more appropriate Continue carvedilol 12.5, digoxin 0.125, Entresto 97-103, spironolactone 25  LBBB Planning for CRT device  History of pulmonary embolism and DVTs Continue apixaban 5  Signed, Summer Small, MD  05/29/2023 11:52 AM    [facility]

## 2023-05-29 NOTE — Patient Instructions (Signed)
Medication Instructions:  Your physician recommends that you continue on your current medications as directed. Please refer to the Current Medication list given to you today. *If you need a refill on your cardiac medications before your next appointment, please call your pharmacy*  Follow-Up: At [facility], you and your health needs are our priority.  As part of our continuing mission to provide you with exceptional heart care, we have created designated Provider Care Teams.  These Care Teams include your primary Cardiologist (physician) and Advanced Practice Providers (APPs -  Physician Assistants and Nurse Practitioners) who all work together to provide you with the care you need, when you need it.  We recommend signing up for the patient portal called "MyChart".  Sign up information is provided on this After Visit Summary.  MyChart is used to connect with patients for Virtual Visits (Telemedicine).  Patients are able to view lab/test results, encounter notes, upcoming appointments, etc.  Non-urgent messages can be sent to your provider as well.   To learn more about what you can do with MyChart, go to [email or web address].    Your next appointment:   After your cardiac MRI  Provider:   York Pellant, MD

## 2023-06-04 ENCOUNTER — Telehealth: Payer: Self-pay

## 2023-06-14 NOTE — Progress Notes (Signed)
[facility] [address] [place] [address]    Clinic Day:  06/15/2023  Referring physician: Smith Robert, MD  Patient Care Team: Smith Robert, MD as PCP - General (Family Medicine) Mallipeddi, Orion Modest, MD as PCP - Cardiology (Cardiology) Mealor, Roberts Gaudy, MD as PCP - Electrophysiology (Cardiology)   ASSESSMENT & PLAN:   Assessment: 1.  Provoked DVT and PE: - Hospitalization from 07/29/2022 - 08/04/2022 with CHF, pneumonia and sepsis - CT angio chest (08/10/2022): Small left upper lobe segmental pulmonary artery emboli.  No right heart strain. - Ultrasound Doppler (08/11/2022): DVT in the right calf vein and in the left popliteal and peroneal veins.  Superficial thrombophlebitis of the right GSV at the level of the knee extending into some communicating superficial varicosities. - She has been on Eliquis since then. - Denies any history of miscarriages or prior DVT.   2. Social/Family History: - Lives alone at home. Retired post Psychologist, clinical. No tobacco use or ETOH.  Is independent of ADLs and IADLs. - Mother had DVTs and PEs, as well as varicose veins. Maternal niece has lung cancer metastasized to the brain and sister has lung cancer.     Plan: 1.  Provoked DVT and PE: - She had DVT and pulmonary embolism after hospitalization for pneumonia/CHF/sepsis. - We reviewed Doppler from 05/20/2023, negative for thrombus.  D-dimer is negative. - Hypercoagulable workup including lupus anticoagulant, factor V Leiden, prothrombin gene mutation were negative. - She continues to have severe fatigue, dyspnea on exertion and decreased mobility as a result of it.  She is seeing cardiology for AICD placement for heart failure (nonischemic cardiomyopathy). - Given her comorbidities, I have recommended continuing anticoagulation at this time.  If her functional status improves after AICD placement, we may safely discontinue anticoagulation at that time. - RTC 3  months for follow-up with repeat D-dimer.   2.  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  Denies any B symptoms. - Discussed workup for MPN which was negative.  Will continue to monitor.    Orders Placed This Encounter  Procedures   D-dimer, quantitative    Standing Status:   Future    Standing Expiration Date:   06/14/2024      I,Katie Daubenspeck,acting as a scribe for Doreatha Massed, MD.,have documented all relevant documentation on the behalf of Doreatha Massed, MD,as directed by  Doreatha Massed, MD while in the presence of Doreatha Massed, MD.   .I, Doreatha Massed MD, have reviewed the above documentation for accuracy and completeness, and I agree with the above.   Doreatha Massed, MD   8/26/20245:28 PM  CHIEF COMPLAINT:   Diagnosis: DVT and PE   Cancer Staging  No matching staging information was found for the patient.    Prior Therapy: None  Current Therapy:  Eliquis    HISTORY OF PRESENT ILLNESS:   Oncology History   No history exists.     INTERVAL HISTORY:   Summer Christensen is a 72 y.o. female presenting to clinic today for follow up of DVT and PE. She was last seen by me on 05/13/23 in consultation.  Since her last visit, she underwent repeat bilateral LE venous US on 05/20/23 showing: no evidence of acute or chronic DVT or SVT within either lower extremity.  Today, she states that she is doing well overall. Her appetite level is at 50%. Her energy level is at 10%.  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   ROTATOR CUFF REPAIR  2009   arthroscopic   TUBAL LIGATION     bilateral    Social History: Social History   Socioeconomic History   Marital status: Divorced    Spouse name: Not on file   Number of children: 3   Years of education: Not on file   Highest education level: Not on file  Occupational History   Occupation: post office clerk    Employer: [employer]  Tobacco Use   Smoking status: Never    Passive exposure: Never   Smokeless tobacco: Never  Vaping Use   Vaping status: Never Used  Substance and Sexual Activity   Alcohol use: No   Drug use: No   Sexual activity: Never    Birth control/protection: Surgical    Comment: hyst  Other Topics Concern   Not on file  Social History Narrative   Not on file   Social Determinants of Health   Financial Resource Strain: Not on file  Food Insecurity: No Food Insecurity (05/13/2023)   Hunger Vital Sign    Worried About Running Out of Food in the Last Year: Never true    Ran Out of Food in the Last Year: Never true  Transportation Needs: No Transportation Needs (05/13/2023)   PRAPARE - Administrator, Civil Service (Medical): No    Lack of Transportation (Non-Medical): No  Physical Activity: Not on file  Stress: Not on file  Social Connections: Not on file  Intimate Partner Violence: Not At Risk (05/13/2023)   Humiliation, Afraid, Rape, and Kick questionnaire    Fear of Current or Ex-Partner: No    Emotionally Abused: No    Physically Abused: No    Sexually Abused: No    Family History: Family History  Problem Relation Age of Onset   Stroke Mother    Heart attack Father     Current Medications:  Current Outpatient Medications:    carvedilol (COREG) 12.5 MG tablet, Take 1 tablet (12.5 mg total) by mouth 2 (two) times daily., Disp: 180  tablet, Rfl: 1   digoxin (LANOXIN) 0.125 MG tablet, Take 1 tablet (0.125 mg total) by mouth daily., Disp: 30 tablet, Rfl: 3   Furosemide (FUROSCIX) 80 MG/10ML CTKT, Inject 80 mg into the skin as needed., Disp: 2 each, Rfl: 0   furosemide (LASIX) 40 MG tablet, TAKE 40MG  DAILY, MAY TAKE ADDITIONAL DOSE OF LASIX 40MG  AS NEEDED FOR SWELLING, Disp: 180 tablet, Rfl: 3   pantoprazole (PROTONIX) 40 MG tablet, Take 1 tablet (40 mg total) by mouth daily., Disp: 30 tablet, Rfl: 2   potassium chloride SA (KLOR-CON M) 20 MEQ tablet, Take 1 tablet (20 mEq total) by mouth daily., Disp: 90 tablet, Rfl: 1   Saccharomyces boulardii (PROBIOTIC) 250 MG CAPS, Take 1 capsule by mouth daily., Disp: , Rfl:    sacubitril-valsartan (  ENTRESTO) 97-103 MG, Take 1 tablet by mouth 2 (two) times daily., Disp: 60 tablet, Rfl: 6   spironolactone (ALDACTONE) 25 MG tablet, Take 1 tablet (25 mg total) by mouth daily., Disp: 90 tablet, Rfl: 3   apixaban (ELIQUIS) 5 MG TABS tablet, Take 1 tablet twice daily, Disp: 60 tablet, Rfl: 5   Allergies: Allergies  Allergen Reactions   Jardiance [Empagliflozin] Itching and Swelling    REVIEW OF SYSTEMS:   Review of Systems  Constitutional:  Positive for fatigue. Negative for chills and fever.  HENT:   Negative for lump/mass, mouth sores, nosebleeds, sore throat and trouble swallowing.   Eyes:  Negative for eye problems.  Respiratory:  Positive for shortness of breath. Negative for cough.   Cardiovascular:  Negative for chest pain, leg swelling and palpitations.  Gastrointestinal:  Negative for abdominal pain, constipation, diarrhea, nausea and vomiting.  Genitourinary:  Negative for bladder incontinence, difficulty urinating, dysuria, frequency, hematuria and nocturia.   Musculoskeletal:  Negative for arthralgias, back pain, flank pain, myalgias and neck pain.  Skin:  Negative for itching and rash.  Neurological:  Negative for dizziness, headaches and numbness.  Hematological:  Does  not bruise/bleed easily.  Psychiatric/Behavioral:  Positive for sleep disturbance. Negative for depression and suicidal ideas. The patient is not nervous/anxious.   All other systems reviewed and are negative.    VITALS:   Blood pressure (!) 97/54, pulse 80, temperature 98.6 F (37 C), temperature source Oral, resp. rate 16, weight 144 lb 4.8 oz (65.5 kg), SpO2 98%.  Wt Readings from Last 3 Encounters:  06/15/23 144 lb 4.8 oz (65.5 kg)  05/29/23 147 lb 3.2 oz (66.8 kg)  05/29/23 147 lb 9.6 oz (67 kg)    Body mass index is 24.77 kg/m.  Performance status (ECOG): 1 - Symptomatic but completely ambulatory  PHYSICAL EXAM:   Physical Exam Vitals and nursing note reviewed. Exam conducted with a chaperone present.  Constitutional:      Appearance: Normal appearance.  Cardiovascular:     Rate and Rhythm: Normal rate and regular rhythm.     Pulses: Normal pulses.     Heart sounds: Normal heart sounds.  Pulmonary:     Effort: Pulmonary effort is normal.     Breath sounds: Normal breath sounds.  Abdominal:     Palpations: Abdomen is soft. There is no hepatomegaly, splenomegaly or mass.     Tenderness: There is no abdominal tenderness.  Musculoskeletal:     Right lower leg: No edema.     Left lower leg: No edema.  Lymphadenopathy:     Cervical: No cervical adenopathy.     Right cervical: No superficial, deep or posterior cervical adenopathy.    Left cervical: No superficial, deep or posterior cervical adenopathy.     Upper Body:     Right upper body: No supraclavicular or axillary adenopathy.     Left upper body: No supraclavicular or axillary adenopathy.  Neurological:     General: No focal deficit present.     Mental Status: She is alert and oriented to person, place, and time.  Psychiatric:        Mood and Affect: Mood normal.        Behavior: Behavior normal.     LABS:      Latest Ref Rng & Units 05/18/2023    6:52 PM 04/19/2023    3:36 AM 08/22/2022    2:58 PM  CBC   WBC 4.0 - 10.5 K/uL 11.6  12.1  20.0   Hemoglobin 12.0 - 15.0 g/dL 64.4  03.4  74.2   Hematocrit 36.0 - 46.0 % 37.0  37.5  35.8   Platelets 150 - 400 K/uL 238  243  414       Latest Ref Rng & Units 05/29/2023   10:06 AM 05/18/2023    6:52 PM 05/04/2023    9:08 AM  CMP  Glucose 70 - 99 mg/dL 595  638  756   BUN 8 - 23 mg/dL 16  19  14    Creatinine 0.44 - 1.00 mg/dL 4.33  2.95  1.88   Sodium 135 - 145 mmol/L 135  134  139   Potassium 3.5 - 5.1 mmol/L 4.3  3.6  3.1   Chloride 98 - 111 mmol/L 101  105  103   CO2 22 - 32 mmol/L 22  22  26    Calcium 8.9 - 10.3 mg/dL 9.8  9.9  9.6   Total Protein 6.5 - 8.1 g/dL  7.2    Total Bilirubin 0.3 - 1.2 mg/dL  0.7    Alkaline Phos 38 - 126 U/L  63    AST 15 - 41 U/L  28    ALT 0 - 44 U/L  29       No results found for: "CEA1", "CEA" / No results found for: "CEA1", "CEA" No results found for: "PSA1" No results found for: "CZY606" No results found for: "CAN125"  No results found for: "TOTALPROTELP", "ALBUMINELP", "A1GS", "A2GS", "BETS", "BETA2SER", "GAMS", "MSPIKE", "SPEI" No results found for: "TIBC", "FERRITIN", "IRONPCTSAT" No results found for: "LDH"   STUDIES:   US Venous Img Lower Bilateral  Result Date: 05/20/2023 CLINICAL DATA:  Follow-up lower extremity DVT and pulmonary embolism. EXAM: BILATERAL LOWER EXTREMITY VENOUS DOPPLER ULTRASOUND TECHNIQUE: Gray-scale sonography with graded compression, as well as color Doppler and duplex ultrasound were performed to evaluate the lower extremity deep venous systems from the level of the common femoral vein and including the common femoral, femoral, profunda femoral, popliteal and calf veins including the posterior tibial, peroneal and gastrocnemius veins when visible. The superficial great saphenous vein was also interrogated. Spectral Doppler was utilized to evaluate flow at rest and with distal augmentation maneuvers in the common femoral, femoral and popliteal veins. COMPARISON:  Bilateral  lower extremity venous Doppler ultrasound-08/11/2022 (positive for DVT involving the right tibial veins as well as the left popliteal and peroneal veins. The examination was also positive for superficial thrombophlebitis involving the right greater saphenous vein at the level of the knee as well as several adjacent varicosities) FINDINGS: RIGHT LOWER EXTREMITY Common Femoral Vein: No evidence of thrombus. Normal compressibility, respiratory phasicity and response to augmentation. Saphenofemoral Junction: No evidence of thrombus. Normal compressibility and flow on color Doppler imaging. Profunda Femoral Vein: No evidence of thrombus. Normal compressibility and flow on color Doppler imaging. Femoral Vein: No evidence of thrombus. Normal compressibility, respiratory phasicity and response to augmentation. Popliteal Vein: No evidence of thrombus. Normal compressibility, respiratory phasicity and response to augmentation. Calf Veins: No evidence of acute or chronic thrombus. Normal compressibility and flow on color Doppler imaging. Superficial Great Saphenous Vein: No evidence of acute or chronic thrombus. Normal compressibility. Other Findings:  None. LEFT LOWER EXTREMITY Common Femoral Vein: No evidence of thrombus. Normal compressibility, respiratory phasicity and response to augmentation. Saphenofemoral Junction: No evidence of thrombus. Normal compressibility and flow on color Doppler imaging. Profunda Femoral Vein: No evidence of thrombus. Normal compressibility and flow on color Doppler imaging. Femoral Vein:  No evidence of thrombus. Normal compressibility, respiratory phasicity and response to augmentation. Popliteal Vein: No evidence of acute or chronic thrombus. Normal compressibility, respiratory phasicity and response to augmentation. Calf Veins: No evidence of acute or chronic thrombus. Normal compressibility and flow on color Doppler imaging. Superficial Great Saphenous Vein: No evidence of thrombus. Normal  compressibility. Other Findings:  None. IMPRESSION: No evidence of acute or chronic DVT or SVT within either lower extremity. Electronically Signed   By: Simonne Come M.D.   On: 05/20/2023 15:39   DG Chest Port 1 View  Result Date: 05/18/2023 CLINICAL DATA:  Dizziness and weakness. EXAM: PORTABLE CHEST 1 VIEW COMPARISON:  April 19, 2023 FINDINGS: The heart size and mediastinal contours are within normal limits. Mild, stable linear scarring and/or atelectasis is seen within the lateral aspect of the left lung base. There is no evidence of acute infiltrate, pleural effusion or pneumothorax. Radiopaque surgical clips are seen within the right upper quadrant. The visualized skeletal structures are unremarkable. IMPRESSION: Mild left basilar linear scarring and/or atelectasis. Electronically Signed   By: Aram Candela M.D.   On: 05/18/2023 21:30

## 2023-06-15 ENCOUNTER — Inpatient Hospital Stay: Payer: Medicare HMO | Attending: Hematology | Admitting: Hematology

## 2023-06-15 VITALS — BP 97/54 | HR 80 | Temp 98.6°F | Resp 16 | Wt 144.3 lb

## 2023-06-15 DIAGNOSIS — I82409 Acute embolism and thrombosis of unspecified deep veins of unspecified lower extremity: Secondary | ICD-10-CM

## 2023-06-15 DIAGNOSIS — Z90710 Acquired absence of both cervix and uterus: Secondary | ICD-10-CM | POA: Insufficient documentation

## 2023-06-15 DIAGNOSIS — I509 Heart failure, unspecified: Secondary | ICD-10-CM | POA: Insufficient documentation

## 2023-06-15 DIAGNOSIS — D72829 Elevated white blood cell count, unspecified: Secondary | ICD-10-CM | POA: Diagnosis not present

## 2023-06-15 DIAGNOSIS — Z86718 Personal history of other venous thrombosis and embolism: Secondary | ICD-10-CM | POA: Diagnosis not present

## 2023-06-15 DIAGNOSIS — Z7901 Long term (current) use of anticoagulants: Secondary | ICD-10-CM | POA: Insufficient documentation

## 2023-06-15 DIAGNOSIS — I428 Other cardiomyopathies: Secondary | ICD-10-CM | POA: Diagnosis not present

## 2023-06-15 DIAGNOSIS — Z801 Family history of malignant neoplasm of trachea, bronchus and lung: Secondary | ICD-10-CM | POA: Insufficient documentation

## 2023-06-15 DIAGNOSIS — Z86711 Personal history of pulmonary embolism: Secondary | ICD-10-CM | POA: Insufficient documentation

## 2023-06-15 MED ORDER — APIXABAN 5 MG PO TABS: ORAL_TABLET | ORAL | 5 refills | Status: DC

## 2023-06-15 NOTE — Patient Instructions (Addendum)
[facility] at [facility] Discharge Instructions   You were seen and examined today by Dr. Ellin Saba.  He reviewed the results of your lab work which are normal/stable.   The ultrasound of your legs did not show any clots.   Continue Eliquis as prescribed.   We will see you back in 3 months.   Return as scheduled.    Thank you for choosing [facility] at [facility] to provide your oncology and hematology care.  To afford each patient quality time with our provider, please arrive at least 15 minutes before your scheduled appointment time.   If you have a lab appointment with the Cancer Center please come in thru the Main Entrance and check in at the main information desk.  You need to re-schedule your appointment should you arrive 10 or more minutes late.  We strive to give you quality time with our providers, and arriving late affects you and other patients whose appointments are after yours.  Also, if you no show three or more times for appointments you may be dismissed from the clinic at the providers discretion.     Again, thank you for choosing [facility].  Our hope is that these requests will decrease the amount of time that you wait before being seen by our physicians.       _____________________________________________________________  Should you have questions after your visit to [facility], please contact our office at [phone] and follow the prompts.  Our office hours are 8:00 a.m. and 4:30 p.m. Monday - Friday.  Please note that voicemails left after 4:00 p.m. may not be returned until the following business day.  We are closed weekends and major holidays.  You do have access to a nurse 24-7, just call the main number to the clinic [phone] and do not press any options, hold on the line and a nurse will answer the phone.    For prescription refill requests, have your pharmacy contact our office  and allow 72 hours.    Due to Covid, you will need to wear a mask upon entering the hospital. If you do not have a mask, a mask will be given to you at the Main Entrance upon arrival. For doctor visits, patients may have 1 support person age 11 or older with them. For treatment visits, patients can not have anyone with them due to social distancing guidelines and our immunocompromised population.

## 2023-06-23 ENCOUNTER — Telehealth (HOSPITAL_COMMUNITY): Payer: Self-pay

## 2023-06-23 DIAGNOSIS — I5022 Chronic systolic (congestive) heart failure: Secondary | ICD-10-CM

## 2023-06-23 NOTE — Telephone Encounter (Signed)
-----   Message from [facility] sent at 06/10/2023  1:54 AM EDT ----- Regarding: RE: CMR Yes that would be great. Thank you.   Adi ----- Message ----- From: Baird Cancer, RN Sent: 06/04/2023  11:16 AM EDT To: Dorthula Nettles, DO Subject: FW: CMR                                        Hi- when you saw this patient on the 9th you had ordered an Echo and follow up in 3 months. Do you want to go ahead and order the CMRI to  be done now? I know you did a bedside Echo at this apt as well would you like to go ahead and get TTE complete done now as well?   Just let me know and I can order!  Thank you  Eileen Stanford B ----- Message ----- From: Festus Holts, RN Sent: 06/04/2023  11:01 AM EDT To: Baird Cancer, RN Subject: CMR                                            Good morning,      This is a mutual patient of ours (Dr Nelly Laurence and Dr Gasper Lloyd) both providers saw her on 05/29/23 - Dr Nelly Laurence was under the impression that this patient was getting a cardiac MRI already ordered by Dr Gasper Lloyd at her appointment with him as he mentioned this in his notes from 05/29/23 - - - do you know anything about this? Didn't see an order for this so I didn't know if anything changed on Sabharwal's side.   Just wanting to follow up for the patient since a CRT device may be needed depending on the results. I can always get an order for Dr Nelly Laurence when he is back in office tomorrow if needed.  Thank you, Brayton Caves, RN

## 2023-06-23 NOTE — Telephone Encounter (Signed)
Attempted to call patient to get CMR/Echo ordered. Unable to reach, left message to call back.

## 2023-06-23 NOTE — Telephone Encounter (Signed)
Pt returned call and aware  Orders placed

## 2023-06-29 ENCOUNTER — Telehealth: Payer: Self-pay | Admitting: Internal Medicine

## 2023-06-29 NOTE — Telephone Encounter (Signed)
   Pre-operative Risk Assessment    Patient Name: Summer Christensen  DOB: 22-Aug-1951 MRN: [id number]     Request for Surgical Clearance    Procedure:  Dental Extraction - Amount of Teeth to be Pulled:  4  Date of Surgery:  Clearance TBD                                 Surgeon:  Dr. Jabier Mutton  Surgeon's Group or Practice Name:  [facility]  Phone number:  [phone] Fax number:  [phone]   Type of Clearance Requested:   - Pharmacy:  Hold Apixaban (Eliquis) ?   Type of Anesthesia:  Not Indicated   Additional requests/questions:    Lajuana Matte   06/29/2023, 11:30 AM

## 2023-06-29 NOTE — Telephone Encounter (Signed)
Patient with diagnosis of DVT/PE on Eliquis for anticoagulation.    Procedure: Dental Extraction - Amount of Teeth to be Pulled:  4  Date of procedure: TBD   CrCl 37 ml/min Platelet count 238  Patient does not require pre-op antibiotics for dental procedure.  Per office protocol, patient can hold Eliquis for 1 day prior to procedure.    **This guidance is not considered finalized until pre-operative APP has relayed final recommendations.**

## 2023-06-29 NOTE — Telephone Encounter (Signed)
Pharmacy please advise on holding Eliquis prior to dental extraction (4 teeth)  scheduled for TBD. Thank you.

## 2023-06-29 NOTE — Telephone Encounter (Signed)
   Patient Name: Summer Christensen  DOB: 02-Jul-1951 MRN: [id number]  Primary Cardiologist: Marjo Bicker, MD  Chart reviewed as part of pre-operative protocol coverage. Given past medical history and time since last visit, based on ACC/AHA guidelines, Summer Christensen is at acceptable risk for the planned procedure without further cardiovascular testing.   CrCl 37 ml/min Platelet count 238   Patient does not require pre-op antibiotics for dental procedure.   Per office protocol, patient can hold Eliquis for 1 day prior to procedure.    Please call with questions.  Joni Reining, NP 06/29/2023, 3:59 PM

## 2023-07-03 ENCOUNTER — Emergency Department (HOSPITAL_COMMUNITY): Payer: Medicare HMO

## 2023-07-03 ENCOUNTER — Other Ambulatory Visit: Payer: Self-pay

## 2023-07-03 ENCOUNTER — Emergency Department (HOSPITAL_COMMUNITY)
Admission: EM | Admit: 2023-07-03 | Discharge: 2023-07-03 | Disposition: A | Discharge status: Home / Self Care | Payer: Medicare HMO | Attending: Emergency Medicine | Admitting: Emergency Medicine

## 2023-07-03 ENCOUNTER — Encounter (HOSPITAL_COMMUNITY): Payer: Self-pay

## 2023-07-03 DIAGNOSIS — I509 Heart failure, unspecified: Secondary | ICD-10-CM | POA: Diagnosis not present

## 2023-07-03 DIAGNOSIS — I6523 Occlusion and stenosis of bilateral carotid arteries: Secondary | ICD-10-CM | POA: Insufficient documentation

## 2023-07-03 DIAGNOSIS — R739 Hyperglycemia, unspecified: Secondary | ICD-10-CM | POA: Insufficient documentation

## 2023-07-03 DIAGNOSIS — Z79899 Other long term (current) drug therapy: Secondary | ICD-10-CM | POA: Insufficient documentation

## 2023-07-03 DIAGNOSIS — R519 Headache, unspecified: Secondary | ICD-10-CM | POA: Diagnosis not present

## 2023-07-03 DIAGNOSIS — K0889 Other specified disorders of teeth and supporting structures: Secondary | ICD-10-CM | POA: Diagnosis present

## 2023-07-03 DIAGNOSIS — Z7901 Long term (current) use of anticoagulants: Secondary | ICD-10-CM | POA: Insufficient documentation

## 2023-07-03 LAB — COMPREHENSIVE METABOLIC PANEL
ALT: 79 U/L — ABNORMAL HIGH (ref 0–44)
AST: 62 U/L — ABNORMAL HIGH (ref 15–41)
Albumin: 4 g/dL (ref 3.5–5.0)
Alkaline Phosphatase: 147 U/L — ABNORMAL HIGH (ref 38–126)
Anion gap: 10 (ref 5–15)
BUN: 25 mg/dL — ABNORMAL HIGH (ref 8–23)
CO2: 22 mmol/L (ref 22–32)
Calcium: 9.5 mg/dL (ref 8.9–10.3)
Chloride: 96 mmol/L — ABNORMAL LOW (ref 98–111)
Creatinine, Ser: 1.51 mg/dL — ABNORMAL HIGH (ref 0.44–1.00)
GFR, Estimated: 37 mL/min — ABNORMAL LOW (ref >60)
Glucose, Bld: 477 mg/dL — ABNORMAL HIGH (ref 70–99)
Potassium: 4.8 mmol/L (ref 3.5–5.1)
Sodium: 128 mmol/L — ABNORMAL LOW (ref 135–145)
Total Bilirubin: 0.4 mg/dL (ref 0.3–1.2)
Total Protein: 7.2 g/dL (ref 6.5–8.1)

## 2023-07-03 LAB — CBC WITH DIFFERENTIAL/PLATELET
Abs Immature Granulocytes: 0.04 10*3/uL (ref 0.00–0.07)
Basophils Absolute: 0 10*3/uL (ref 0.0–0.1)
Basophils Relative: 0 %
Eosinophils Absolute: 0.1 10*3/uL (ref 0.0–0.5)
Eosinophils Relative: 1 %
HCT: 38 % (ref 36.0–46.0)
Hemoglobin: 12.9 g/dL (ref 12.0–15.0)
Immature Granulocytes: 0 %
Lymphocytes Relative: 19 %
Lymphs Abs: 2.3 10*3/uL (ref 0.7–4.0)
MCH: 28.9 pg (ref 26.0–34.0)
MCHC: 33.9 g/dL (ref 30.0–36.0)
MCV: 85 fL (ref 80.0–100.0)
Monocytes Absolute: 0.7 10*3/uL (ref 0.1–1.0)
Monocytes Relative: 6 %
Neutro Abs: 8.9 10*3/uL — ABNORMAL HIGH (ref 1.7–7.7)
Neutrophils Relative %: 74 %
Platelets: 241 10*3/uL (ref 150–400)
RBC: 4.47 MIL/uL (ref 3.87–5.11)
RDW: 12 % (ref 11.5–15.5)
WBC: 12 10*3/uL — ABNORMAL HIGH (ref 4.0–10.5)
nRBC: 0 % (ref 0.0–0.2)

## 2023-07-03 MED ORDER — OXYCODONE-ACETAMINOPHEN 5-325 MG PO TABS
1.0000 | ORAL_TABLET | Freq: Once | ORAL | Status: AC
  Administered 2023-07-03: 1 via ORAL
  Filled 2023-07-03: qty 1

## 2023-07-03 MED ORDER — METFORMIN HCL 500 MG PO TABS: 500.0000 mg | ORAL_TABLET | Freq: Two times a day (BID) | ORAL | 0 refills | Status: DC

## 2023-07-03 MED ORDER — OXYCODONE-ACETAMINOPHEN 5-325 MG PO TABS: 1.0000 | ORAL_TABLET | Freq: Four times a day (QID) | ORAL | 0 refills | Status: DC | PRN

## 2023-07-03 MED ORDER — METFORMIN HCL 500 MG PO TABS
500.0000 mg | ORAL_TABLET | Freq: Once | ORAL | Status: AC
  Administered 2023-07-03: 500 mg via ORAL
  Filled 2023-07-03: qty 1

## 2023-07-03 NOTE — ED Triage Notes (Addendum)
C/o weakness and fatigue x5 days.  Also c/o soreness to left eye with drainage in mornings and headache.   Denies vision changes.  Pt currently on eliquis and waiting for tooth extraction.

## 2023-07-03 NOTE — ED Notes (Signed)
ED Provider at bedside. 

## 2023-07-03 NOTE — Discharge Instructions (Signed)
Continue taking your penicillin.  Follow-up with the dentist next week.  Also you have been prescribed some metformin for your elevated sugar.  You should follow-up with your family doctor next week so they can see how your sugars doing

## 2023-07-03 NOTE — ED Notes (Signed)
Patient transported to CT 

## 2023-07-03 NOTE — ED Provider Notes (Signed)
Summer Christensen EMERGENCY DEPARTMENT AT [facility] Provider Note   CSN: [id number] Arrival date & time: 07/03/23  2014     History {Add pertinent medical, surgical, social history, OB history to HPI:1} Chief Complaint  Patient presents with   Weakness    Summer Christensen is a 72 y.o. female.  Patient with history of congestive heart failure and a DVT.  She is on penicillin for a tooth ache.  She presents with worsening pain going up the left side of her face   Weakness      Home Medications Prior to Admission medications   Medication Sig Start Date End Date Taking? Authorizing Provider  metFORMIN (GLUCOPHAGE) 500 MG tablet Take 1 tablet (500 mg total) by mouth 2 (two) times daily with a meal. 07/03/23  Yes Bethann Berkshire, MD  oxyCODONE-acetaminophen (PERCOCET/ROXICET) 5-325 MG tablet Take 1 tablet by mouth every 6 (six) hours as needed for severe pain. 07/03/23  Yes Bethann Berkshire, MD  apixaban Everlene Balls) 5 MG TABS tablet Take 1 tablet twice daily 06/15/23   Doreatha Massed, MD  carvedilol (COREG) 12.5 MG tablet Take 1 tablet (12.5 mg total) by mouth 2 (two) times daily. 09/29/22   Mallipeddi, Vishnu P, MD  digoxin (LANOXIN) 0.125 MG tablet Take 1 tablet (0.125 mg total) by mouth daily. 05/04/23   Sabharwal, Aditya, DO  Furosemide (FUROSCIX) 80 MG/10ML CTKT Inject 80 mg into the skin as needed. 04/13/23   Mallipeddi, Vishnu P, MD  furosemide (LASIX) 40 MG tablet TAKE 40MG  DAILY, MAY TAKE ADDITIONAL DOSE OF LASIX 40MG  AS NEEDED FOR SWELLING 05/29/23   Sabharwal, Aditya, DO  pantoprazole (PROTONIX) 40 MG tablet Take 1 tablet (40 mg total) by mouth daily. 08/05/22   Vassie Loll, MD  potassium chloride SA (KLOR-CON M) 20 MEQ tablet Take 1 tablet (20 mEq total) by mouth daily. 05/07/23   Mallipeddi, Vishnu P, MD  Saccharomyces boulardii (PROBIOTIC) 250 MG CAPS Take 1 capsule by mouth daily. 06/24/22   [provider]  sacubitril-valsartan (ENTRESTO) 97-103 MG Take 1 tablet  by mouth 2 (two) times daily. 05/04/23   Sabharwal, Aditya, DO  spironolactone (ALDACTONE) 25 MG tablet Take 1 tablet (25 mg total) by mouth daily. 11/19/22   Mallipeddi, Orion Modest, MD      Allergies    Jardiance [empagliflozin]    Review of Systems   Review of Systems  Neurological:  Positive for weakness.    Physical Exam Updated Vital Signs BP (!) 161/77   Pulse 72   Temp 98.9 F (37.2 C) (Oral)   Resp 11   SpO2 96%  Physical Exam  ED Results / Procedures / Treatments   Labs (all labs ordered are listed, but only abnormal results are displayed) Labs Reviewed  CBC WITH DIFFERENTIAL/PLATELET - Abnormal; Notable for the following components:      Result Value   WBC 12.0 (*)    Neutro Abs 8.9 (*)    All other components within normal limits  COMPREHENSIVE METABOLIC PANEL - Abnormal; Notable for the following components:   Sodium 128 (*)    Chloride 96 (*)    Glucose, Bld 477 (*)    BUN 25 (*)    Creatinine, Ser 1.51 (*)    AST 62 (*)    ALT 79 (*)    Alkaline Phosphatase 147 (*)    GFR, Estimated 37 (*)    All other components within normal limits    EKG EKG Interpretation Date/Time:  Friday July 03 2023  20:59:44 EDT Ventricular Rate:  70 PR Interval:  202 QRS Duration:  164 QT Interval:  410 QTC Calculation: 443 R Axis:   90  Text Interpretation: Sinus rhythm Left ventricular hypertrophy Confirmed by Bethann Berkshire [phone]) on 07/03/2023 9:07:44 PM  Radiology CT Head Wo Contrast  Result Date: 07/03/2023 CLINICAL DATA:  Weakness and fatigue EXAM: CT HEAD WITHOUT CONTRAST TECHNIQUE: Contiguous axial images were obtained from the base of the skull through the vertex without intravenous contrast. RADIATION DOSE REDUCTION: This exam was performed according to the departmental dose-optimization program which includes automated exposure control, adjustment of the mA and/or kV according to patient size and/or use of iterative reconstruction technique. COMPARISON:   None Available. FINDINGS: Brain: There is no mass, hemorrhage or extra-axial collection. The size and configuration of the ventricles and extra-axial CSF spaces are normal. The brain parenchyma is normal, without acute or chronic infarction. Vascular: Atherosclerotic calcification of the internal carotid arteries at the skull base. No abnormal hyperdensity of the major intracranial arteries or dural venous sinuses. Skull: The visualized skull base, calvarium and extracranial soft tissues are normal. Sinuses/Orbits: No fluid levels or advanced mucosal thickening of the visualized paranasal sinuses. No mastoid or middle ear effusion. The orbits are normal. IMPRESSION: No acute intracranial abnormality. Electronically Signed   By: Deatra Robinson M.D.   On: 07/03/2023 23:05    Procedures Procedures  {Document cardiac monitor, telemetry assessment procedure when appropriate:1}  Medications Ordered in ED Medications  metFORMIN (GLUCOPHAGE) tablet 500 mg (has no administration in time range)  oxyCODONE-acetaminophen (PERCOCET/ROXICET) 5-325 MG per tablet 1 tablet (1 tablet Oral Given 07/03/23 2131)    ED Course/ Medical Decision Making/ A&P   {   Click here for ABCD2, HEART and other calculatorsREFRESH Note before signing :1}                              Medical Decision Making Amount and/or Complexity of Data Reviewed Labs: ordered. Radiology: ordered. ECG/medicine tests: ordered.  Risk Prescription drug management.  Patient with tooth abscess.  She will continue the penicillin and is given some Percocet for pain.  She will follow-up with a dentist next week.  Patient also has elevated glucose and it does not look like she is on any medicine for this.  She is going to start Glucophage 500 twice a day and follow-up with family doctor next week  {Document critical care time when appropriate:1} {Document review of labs and clinical decision tools ie heart score, Chads2Vasc2 etc:1}  {Document your  independent review of radiology images, and any outside records:1} {Document your discussion with family members, caretakers, and with consultants:1} {Document social determinants of health affecting pt's care:1} {Document your decision making why or why not admission, treatments were needed:1} Final Clinical Impression(s) / ED Diagnoses Final diagnoses:  Toothache  Hyperglycemia    Rx / DC Orders ED Discharge Orders          Ordered    metFORMIN (GLUCOPHAGE) 500 MG tablet  2 times daily with meals        07/03/23 2335    oxyCODONE-acetaminophen (PERCOCET/ROXICET) 5-325 MG tablet  Every 6 hours PRN        07/03/23 2335

## 2023-07-14 ENCOUNTER — Other Ambulatory Visit (HOSPITAL_COMMUNITY)
Admission: RE | Admit: 2023-07-14 | Discharge: 2023-07-14 | Disposition: A | Discharge status: Home / Self Care | Payer: Medicare HMO | Source: Ambulatory Visit | Attending: Internal Medicine | Admitting: Internal Medicine

## 2023-07-14 ENCOUNTER — Ambulatory Visit: Payer: Medicare HMO | Attending: Internal Medicine | Admitting: Internal Medicine

## 2023-07-14 ENCOUNTER — Encounter: Payer: Self-pay | Admitting: Internal Medicine

## 2023-07-14 VITALS — BP 138/70 | HR 85 | Ht 64.0 in | Wt 137.0 lb

## 2023-07-14 DIAGNOSIS — I110 Hypertensive heart disease with heart failure: Secondary | ICD-10-CM | POA: Insufficient documentation

## 2023-07-14 DIAGNOSIS — I5022 Chronic systolic (congestive) heart failure: Secondary | ICD-10-CM | POA: Insufficient documentation

## 2023-07-14 DIAGNOSIS — I428 Other cardiomyopathies: Secondary | ICD-10-CM | POA: Diagnosis not present

## 2023-07-14 DIAGNOSIS — R5383 Other fatigue: Secondary | ICD-10-CM | POA: Diagnosis not present

## 2023-07-14 DIAGNOSIS — I10 Essential (primary) hypertension: Secondary | ICD-10-CM | POA: Diagnosis not present

## 2023-07-14 LAB — VITAMIN D 25 HYDROXY (VIT D DEFICIENCY, FRACTURES): Vit D, 25-Hydroxy: 33.47 ng/mL (ref 30–100)

## 2023-07-14 LAB — IRON AND TIBC
Iron: 66 ug/dL (ref 28–170)
Saturation Ratios: 18 % (ref 10.4–31.8)
TIBC: 364 ug/dL (ref 250–450)
UIBC: 298 ug/dL

## 2023-07-14 LAB — FERRITIN: Ferritin: 341 ng/mL — ABNORMAL HIGH (ref 11–307)

## 2023-07-14 NOTE — Addendum Note (Signed)
Addended by: Leonides Schanz C on: 07/14/2023 11:40 AM   Modules accepted: Orders

## 2023-07-14 NOTE — Patient Instructions (Signed)
Medication Instructions:  Your physician recommends that you continue on your current medications as directed. Please refer to the Current Medication list given to you today.  *If you need a refill on your cardiac medications before your next appointment, please call your pharmacy*   Lab Work: Vitamin D Iron Panel w/ Ferritin   If you have labs (blood work) drawn today and your tests are completely normal, you will receive your results only by: MyChart Message (if you have MyChart) OR A paper copy in the mail If you have any lab test that is abnormal or we need to change your treatment, we will call you to review the results.   Testing/Procedures: Your physician has requested that you have an echocardiogram. Echocardiography is a painless test that uses sound waves to create images of your heart. It provides your doctor with information about the size and shape of your heart and how well your heart's chambers and valves are working. This procedure takes approximately one hour. There are no restrictions for this procedure. Please do NOT wear cologne, perfume, aftershave, or lotions (deodorant is allowed). Please arrive 15 minutes prior to your appointment time.    Follow-Up: At [facility], you and your health needs are our priority.  As part of our continuing mission to provide you with exceptional heart care, we have created designated Provider Care Teams.  These Care Teams include your primary Cardiologist (physician) and Advanced Practice Providers (APPs -  Physician Assistants and Nurse Practitioners) who all work together to provide you with the care you need, when you need it.  We recommend signing up for the patient portal called "MyChart".  Sign up information is provided on this After Visit Summary.  MyChart is used to connect with patients for Virtual Visits (Telemedicine).  Patients are able to view lab/test results, encounter notes, upcoming appointments, etc.   Non-urgent messages can be sent to your provider as well.   To learn more about what you can do with MyChart, go to [email or web address].    Your next appointment:   6 month(s)  Provider:   You may see Vishnu P Mallipeddi, MD or one of the following Advanced Practice Providers on your designated Care Team:   Randall An, PA-C  Jacolyn Reedy, PA-C     Other Instructions Thank you for choosing [facility]!

## 2023-07-14 NOTE — Progress Notes (Signed)
Cardiology Office Note  Date: 07/14/2023   ID: Summer, Christensen October 11, 1951, MRN [id number]  PCP:  Summer Robert, MD  Cardiologist:  Summer Bicker, MD Electrophysiologist:  Summer Small, MD    History of Present Illness: Summer Christensen is a 72 y.o. 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  Patient was diagnosed with new onset cardiomyopathy with LVEF less than 20% when she was admitted to [facility] in 07/2022 with severe sepsis secondary to pneumonia.  She was readmitted again in 2 weeks and was diagnosed with LUL pulmonary embolus and acute DVT of her right leg for which she is currently on Eliquis. CTA cardiac showed coronary calcium score of 53.8 and mild calcified plaque in the ostial LAD. Patient was on GDMT for 3 months after which repeat echocardiogram continue to show LVEF 20 to 25%.  Patient was seen by advanced heart failure, bedside echo showed LVEF 40%, echocardiogram repeat was ordered but not scheduled yet.  Here for follow-up visit.  Has severe fatigue, feeling nauseous, has dysphagia to solid diet in the last 3 weeks (describes this feeling as sore throat as well), complains of a nasty taste in her mouth and tongue. She also has DOE associated with severe fatigue. Following up with advanced heart failure and electrophysiology.  No chest pains, syncope or leg swelling.  She does have occasional dizziness when her blood pressure drops to 97 mmHg SBP which happened only 1 time so far.  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   ROTATOR CUFF REPAIR  2009   arthroscopic   TUBAL LIGATION     bilateral    Current Outpatient Medications  Medication Sig Dispense Refill   apixaban (ELIQUIS) 5 MG TABS tablet Take 1 tablet twice daily 60 tablet 5   carvedilol (COREG) 12.5 MG tablet Take 1 tablet (12.5 mg total) by mouth 2 (two) times daily. 180 tablet 1   digoxin (LANOXIN) 0.125 MG tablet Take 1 tablet (0.125 mg total) by mouth daily. 30 tablet 3   Furosemide (FUROSCIX) 80 MG/10ML CTKT Inject 80 mg into the skin as needed. 2 each 0   furosemide (LASIX) 40 MG tablet TAKE 40MG  DAILY, MAY TAKE ADDITIONAL DOSE OF LASIX 40MG  AS NEEDED FOR SWELLING 180 tablet 3   metFORMIN (GLUCOPHAGE) 500 MG tablet Take 1 tablet (500 mg total) by mouth 2 (two) times daily with a meal. 60 tablet 0   oxyCODONE-acetaminophen (PERCOCET/ROXICET) 5-325 MG tablet Take 1 tablet by mouth every 6 (six) hours as needed for severe pain. 15 tablet 0   pantoprazole (PROTONIX) 40 MG tablet Take 1 tablet (40 mg total) by mouth daily. 30 tablet 2   potassium chloride SA (KLOR-CON M) 20 MEQ tablet Take 1 tablet (20 mEq total) by mouth daily. 90 tablet 1   Saccharomyces boulardii (PROBIOTIC) 250 MG CAPS Take  1 capsule by mouth daily.     sacubitril-valsartan (ENTRESTO) 97-103 MG Take 1 tablet by mouth 2 (two) times daily. 60 tablet 6   spironolactone (ALDACTONE) 25 MG tablet Take 1 tablet (25 mg total) by mouth daily. 90 tablet 3   No current facility-administered medications for this visit.   Allergies:  Jardiance [empagliflozin]   Social History: The patient  reports that she has never smoked. She has never been exposed to tobacco smoke. She has never used smokeless tobacco. She reports that she does not drink alcohol and does not use drugs.   Family History: The patient's family history includes Heart attack in her father; Stroke in her mother.   ROS:  Please see  the history of present illness. Otherwise, complete review of systems is positive for none.  All other systems are reviewed and negative.   Physical Exam: VS:  BP 138/70 (BP Location: Right Arm, Patient Position: Sitting, Cuff Size: Normal)   Pulse 85   Ht 5\' 4"  (1.626 m)   Wt 137 lb (62.1 kg)   SpO2 98%   BMI 23.52 kg/m , BMI Body mass index is 23.52 kg/m.  Wt Readings from Last 3 Encounters:  07/14/23 137 lb (62.1 kg)  06/15/23 144 lb 4.8 oz (65.5 kg)  05/29/23 147 lb 3.2 oz (66.8 kg)    General: Patient appears comfortable at rest. HEENT: Conjunctiva and lids normal, oropharynx clear with moist mucosa. Neck: Supple, no elevated JVP or carotid bruits, no thyromegaly. Lungs: Clear to auscultation, nonlabored breathing at rest. Cardiac: Regular rate and rhythm, no S3 or significant systolic murmur, no pericardial rub. Abdomen: Soft, nontender, no hepatomegaly, bowel sounds present, no guarding or rebound. Extremities: No pitting edema, distal pulses 2+. Skin: Warm and dry. Musculoskeletal: No kyphosis. Neuropsychiatric: Alert and oriented x3, affect grossly appropriate.  ECG: Normal sinus rhythm and left bundle branch block  Recent Labwork: 08/11/2022: Magnesium 2.3 05/13/2023: TSH 0.711 05/29/2023: B Natriuretic Peptide 97.5 07/03/2023: ALT 79; AST 62; BUN 25; Creatinine, Ser 1.51; Hemoglobin 12.9; Platelets 241; Potassium 4.8; Sodium 128     Component Value Date/Time   TRIG 123 08/02/2022 0352    Other Studies Reviewed Today: Echo from 07/2022 LVEF 20 to 25% with anteroseptal akinesis RV systolic function is normal and RV size normal Mildly elevated pulmonary artery systolic pressures  Assessment and Plan: Patient is a 72 year old F known to have NICM LVEF 20-25% with no device, PE/DVT diagnosed in 7/23 on Eliquis presented to cardiology clinic for follow-up visit.  # NICM, LVEF 20 to 25% with no device, NYHA II # LBBB with QRS 152 msec # Severe fatigue -Patient  reports severe fatigue associated with SOB. Will obtain vitamin D, iron panel with ferritin.  Addendum: Iron panel with ferritin is within normal limits, does not meet criteria for IV iron infusion. TSH within normal limits. Will also obtain serum lactic acid levels due to severe fatigue, nausea etc. She was seen by advanced heart failure, bedside echo was performed and the LVEF was noted to be 40%. Repeat echocardiogram is ordered but not scheduled yet.  Will schedule echocardiogram for October 2024 to document LVEF improvement.  She has an upcoming appointment with Dr. Hyacinth Meeker in November 2024. -Will continue current GDMT, p.o. Lasix 40 mg once daily, carvedilol 12.5 mg twice daily, Entresto 97-103 mg twice daily, spironolactone 25 mg once daily, digoxin 0.125 mg once daily.  Did not tolerate SGLT2i in the past due to tenderness in the perineal area.  #  Acute LUL PE in 2023 # Acute R DVT in 2023 -Continue Eliquis 5 mg twice daily -Patient was stable to hematology to determine the duration of anticoagulation.  Given her comorbidities, hematology recommended to continue systemic anticoagulation.  If in the future, her function status improves after AICD placement, Eliquis can be discontinued at that time.  # HLD, unknown value -Continue atorvastatin 40 mg nightly.  # Liver mass on CT cardiac from 10/08/2022 -Suspected mass lesion in the right lobe of liver concerning for tumor, MR imaging with and without contrast was recommended.  Follow with PCP for further MRI imaging.   Tests Ordered: Orders Placed This Encounter  Procedures   Iron, TIBC and Ferritin Panel   Vitamin D (25 hydroxy)   ECHOCARDIOGRAM COMPLETE    Medication Changes: No orders of the defined types were placed in this encounter.   Disposition:  Follow up  6 month  Signed Maleeyah Mccaughey Verne Spurr, MD, 07/14/2023 11:27 AM    [facility] [address] [place], [place], [place] [address]

## 2023-07-22 NOTE — Telephone Encounter (Signed)
Mistakenly opened encounter

## 2023-07-27 ENCOUNTER — Ambulatory Visit: Payer: Medicare HMO | Attending: Internal Medicine

## 2023-07-27 DIAGNOSIS — I5022 Chronic systolic (congestive) heart failure: Secondary | ICD-10-CM

## 2023-07-28 LAB — ECHOCARDIOGRAM COMPLETE
AR max vel: 1.63 cm2
AV Area VTI: 1.57 cm2
AV Area mean vel: 1.63 cm2
AV Mean grad: 4 mm[Hg]
AV Peak grad: 8.1 mm[Hg]
Ao pk vel: 1.42 m/s
Area-P 1/2: 7.44 cm2
Calc EF: 25.3 %
MV M vel: 4.8 m/s
MV Peak grad: 92.2 mm[Hg]
MV VTI: 1.51 cm2
S' Lateral: 4.5 cm
Single Plane A2C EF: 28.8 %
Single Plane A4C EF: 19.5 %

## 2023-08-14 ENCOUNTER — Other Ambulatory Visit (HOSPITAL_COMMUNITY): Payer: Medicare HMO

## 2023-08-31 ENCOUNTER — Telehealth (HOSPITAL_COMMUNITY): Payer: Self-pay | Admitting: *Deleted

## 2023-08-31 NOTE — Telephone Encounter (Signed)
 Attempted to call patient regarding upcoming cardiac MRI appointment. Left message on voicemail with name and callback number Johney Frame RN Navigator Cardiac Imaging [facility] [phone] Office

## 2023-09-01 ENCOUNTER — Other Ambulatory Visit (HOSPITAL_COMMUNITY): Payer: Self-pay | Admitting: Cardiology

## 2023-09-01 ENCOUNTER — Ambulatory Visit (HOSPITAL_COMMUNITY)
Admission: RE | Admit: 2023-09-01 | Discharge: 2023-09-01 | Disposition: A | Discharge status: Home / Self Care | Payer: Medicare HMO | Source: Ambulatory Visit | Attending: Cardiology | Admitting: Cardiology

## 2023-09-01 DIAGNOSIS — I5022 Chronic systolic (congestive) heart failure: Secondary | ICD-10-CM

## 2023-09-01 MED ORDER — GADOBUTROL 1 MMOL/ML IV SOLN
7.0000 mL | Freq: Once | INTRAVENOUS | Status: AC | PRN
  Administered 2023-09-01: 7 mL via INTRAVENOUS

## 2023-09-07 ENCOUNTER — Ambulatory Visit: Payer: Medicare HMO | Attending: Cardiovascular Disease | Admitting: Cardiovascular Disease

## 2023-09-08 ENCOUNTER — Encounter: Payer: Self-pay | Admitting: Cardiovascular Disease

## 2023-09-15 ENCOUNTER — Inpatient Hospital Stay: Payer: Medicare HMO | Admitting: Hematology

## 2023-09-15 ENCOUNTER — Inpatient Hospital Stay: Payer: Medicare HMO

## 2023-10-05 ENCOUNTER — Inpatient Hospital Stay: Payer: Medicare HMO | Attending: Hematology

## 2023-10-05 ENCOUNTER — Inpatient Hospital Stay: Payer: Medicare HMO | Admitting: Hematology

## 2023-10-05 VITALS — BP 104/70 | HR 91 | Temp 98.8°F | Resp 16 | Wt 131.8 lb

## 2023-10-05 DIAGNOSIS — Z801 Family history of malignant neoplasm of trachea, bronchus and lung: Secondary | ICD-10-CM | POA: Diagnosis not present

## 2023-10-05 DIAGNOSIS — I82409 Acute embolism and thrombosis of unspecified deep veins of unspecified lower extremity: Secondary | ICD-10-CM | POA: Diagnosis not present

## 2023-10-05 DIAGNOSIS — Z86711 Personal history of pulmonary embolism: Secondary | ICD-10-CM | POA: Insufficient documentation

## 2023-10-05 DIAGNOSIS — Z7901 Long term (current) use of anticoagulants: Secondary | ICD-10-CM | POA: Insufficient documentation

## 2023-10-05 DIAGNOSIS — D508 Other iron deficiency anemias: Secondary | ICD-10-CM | POA: Diagnosis not present

## 2023-10-05 DIAGNOSIS — Z86718 Personal history of other venous thrombosis and embolism: Secondary | ICD-10-CM | POA: Insufficient documentation

## 2023-10-05 DIAGNOSIS — D72829 Elevated white blood cell count, unspecified: Secondary | ICD-10-CM | POA: Diagnosis not present

## 2023-10-05 LAB — D-DIMER, QUANTITATIVE: D-Dimer, Quant: 0.55 ug{FEU}/mL — ABNORMAL HIGH (ref 0.00–0.50)

## 2023-10-05 NOTE — Progress Notes (Signed)
[facility] [address], [place] [address]    Clinic Day:  10/05/2023  Referring physician: Smith Robert, MD  Patient Care Team: [facility] as PCP - General Mallipeddi, Orion Modest, MD as PCP - Cardiology (Cardiology) Mealor, Roberts Gaudy, MD as PCP - Electrophysiology (Cardiology)   ASSESSMENT & PLAN:   Assessment: 1.  Provoked DVT and PE: - Hospitalization from 07/29/2022 - 08/04/2022 with CHF, pneumonia and sepsis - CT angio chest (08/10/2022): Small left upper lobe segmental pulmonary artery emboli.  No right heart strain. - Ultrasound Doppler (08/11/2022): DVT in the right calf vein and in the left popliteal and peroneal veins.  Superficial thrombophlebitis of the right GSV at the level of the knee extending into some communicating superficial varicosities. - She has been on Eliquis since then. - Denies any history of miscarriages or prior DVT.   2. Social/Family History: - Lives alone at home. Retired post Psychologist, clinical. No tobacco use or ETOH.  Is independent of ADLs and IADLs. - Mother had DVTs and PEs, as well as varicose veins. Maternal niece has lung cancer metastasized to the brain and sister has lung cancer.     Plan: 1.  Provoked DVT and PE: - She had DVT and PE after hospitalization for pneumonia/CHF/sepsis. - Doppler on 05/20/2023: Negative for DVT. - Hypercoagulable workup: Negative for lupus anticoagulant/factor V Leiden/prothrombin gene mutation. - She reports improvement in her fatigue although not back to her baseline.  She reports decreased appetite and sleep. - Latest echocardiogram in October 2024 shows EF 30-35%.  She is being followed closely by cardiology for possible AICD placement. - D-dimer today is elevated at 0.55.  Denies any recent surgery or infection. - Recommend continuing anticoagulation at this time.  If her CHF and functional status improves with or without AICD placement, we may  discontinue anticoagulation.  RTC 6 months with repeat D-dimer.    2.  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  We have done workup for MPN which was negative.    Orders Placed This Encounter  Procedures   CBC    Standing Status:   Future    Expected Date:   03/28/2024    Expiration Date:   10/04/2024   Iron and TIBC ([facility] ONLY)    Standing Status:   Future    Expected Date:   03/28/2024    Expiration Date:   10/04/2024   Ferritin    Standing Status:   Future    Expected Date:   03/28/2024    Expiration Date:   10/04/2024   D-dimer, quantitative    Standing Status:   Future    Expected Date:   03/28/2024    Expiration Date:   10/04/2024      Mikeal Hawthorne R Teague,acting as a scribe for Doreatha Massed, MD.,have documented all relevant documentation on the behalf of Doreatha Massed, MD,as directed by  Doreatha Massed, MD while in the presence of Doreatha Massed, MD.  I, Doreatha Massed MD, have reviewed the above documentation for accuracy and completeness, and I agree with the above.    Doreatha Massed, MD   12/16/20244:45 PM  CHIEF COMPLAINT:   Diagnosis: DVT and PE   Cancer Staging  No matching staging information was found for the patient.    Prior Therapy: None  Current Therapy:  Eliquis    HISTORY OF PRESENT ILLNESS:  Oncology History   No history exists.     INTERVAL HISTORY:   Summer Christensen is a 72 y.o. female presenting to clinic today for follow up of DVT and PE. She was last seen by me on 06/15/23.  Since her last visit, she presented to the ED on 07/03/23 for a toothache from an abscess. She was prescribed percocet 5-325 mg q6h prn and metformin 500 mg BID for elevated blood glucose.   Today, she states that she is doing well overall. Her appetite level is at 10%. Her energy level is at 50%.  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   ROTATOR CUFF REPAIR  2009   arthroscopic   TUBAL LIGATION     bilateral    Social History: Social History   Socioeconomic History   Marital status: Divorced    Spouse name: Not on file   Number of children: 3   Years of education: Not on file   Highest education level: Not on file  Occupational History   Occupation: post office clerk    Employer: [employer]  Tobacco Use   Smoking status: Never    Passive exposure: Never   Smokeless tobacco: Never  Vaping Use   Vaping status: Never Used  Substance and Sexual Activity   Alcohol use: No   Drug use: No   Sexual activity: Never    Birth control/protection: Surgical    Comment: hyst  Other Topics Concern   Not on file  Social History Narrative   Not on file   Social Drivers of Health   Financial Resource Strain: Not on file  Food Insecurity: No Food Insecurity (05/13/2023)   Hunger Vital Sign    Worried About Running Out of Food in the Last Year: Never true    Ran Out of Food in the Last Year: Never true  Transportation Needs: No Transportation Needs (05/13/2023)   PRAPARE - Administrator, Civil Service (Medical): No    Lack of Transportation (Non-Medical): No  Physical Activity: Not on file  Stress: Not on file  Social Connections: Not on file  Intimate Partner Violence: Not At Risk (05/13/2023)   Humiliation, Afraid, Rape, and Kick questionnaire    Fear of Current or  Ex-Partner: No    Emotionally Abused: No    Physically Abused: No    Sexually Abused: No    Family History: Family History  Problem Relation Age of Onset   Stroke Mother    Heart attack Father     Current Medications:  Current Outpatient Medications:    apixaban (ELIQUIS) 5 MG TABS tablet, Take 1 tablet twice daily, Disp: 60 tablet, Rfl: 5   carvedilol (COREG) 12.5 MG tablet, Take 1 tablet (12.5 mg total) by mouth 2 (two) times daily., Disp: 180 tablet, Rfl: 1   digoxin (LANOXIN) 0.125 MG tablet, Take 1 tablet (0.125  mg total) by mouth daily., Disp: 30 tablet, Rfl: 3   Furosemide (FUROSCIX) 80 MG/10ML CTKT, Inject 80 mg into the skin as needed., Disp: 2 each, Rfl: 0   furosemide (LASIX) 40 MG tablet, TAKE 40MG  DAILY, MAY TAKE ADDITIONAL DOSE OF LASIX 40MG  AS NEEDED FOR SWELLING, Disp: 180 tablet, Rfl: 3   oxyCODONE-acetaminophen (PERCOCET/ROXICET) 5-325 MG tablet, Take 1 tablet by mouth every 6 (six) hours as needed for severe pain., Disp: 15 tablet, Rfl: 0   pantoprazole (PROTONIX) 40 MG tablet, Take 1 tablet (40 mg total) by mouth daily., Disp: 30 tablet, Rfl: 2   potassium chloride SA (KLOR-CON M) 20 MEQ tablet, Take 1 tablet (20 mEq total) by mouth daily., Disp: 90 tablet, Rfl: 1   Saccharomyces boulardii (PROBIOTIC) 250 MG CAPS, Take 1 capsule by mouth daily., Disp: , Rfl:    sacubitril-valsartan (ENTRESTO) 97-103 MG, Take 1 tablet by mouth 2 (two) times daily., Disp: 60 tablet, Rfl: 6   spironolactone (ALDACTONE) 25 MG tablet, Take 1 tablet (25 mg total) by mouth daily., Disp: 90 tablet, Rfl: 3   Allergies: Allergies  Allergen Reactions   Jardiance [Empagliflozin] Itching and Swelling   Metformin And Related Itching and Rash    REVIEW OF SYSTEMS:   Review of Systems  Constitutional:  Negative for chills, fatigue and fever.  HENT:   Negative for lump/mass, mouth sores, nosebleeds, sore throat and trouble swallowing.   Eyes:  Negative for eye problems.  Respiratory:   Negative for cough and shortness of breath.   Cardiovascular:  Negative for chest pain, leg swelling and palpitations.  Gastrointestinal:  Negative for abdominal pain, constipation, diarrhea, nausea and vomiting.  Genitourinary:  Negative for bladder incontinence, difficulty urinating, dysuria, frequency, hematuria and nocturia.   Musculoskeletal:  Negative for arthralgias, back pain, flank pain, myalgias and neck pain.  Skin:  Negative for itching and rash.  Neurological:  Negative for dizziness, headaches and numbness.  Hematological:  Does not bruise/bleed easily.  Psychiatric/Behavioral:  Positive for sleep disturbance. Negative for depression and suicidal ideas. The patient is not nervous/anxious.   All other systems reviewed and are negative.    VITALS:   Blood pressure 104/70, pulse 91, temperature 98.8 F (37.1 C), temperature source Oral, resp. rate 16, weight 131 lb 12.8 oz (59.8 kg), SpO2 98%.  Wt Readings from Last 3 Encounters:  10/05/23 131 lb 12.8 oz (59.8 kg)  07/14/23 137 lb (62.1 kg)  06/15/23 144 lb 4.8 oz (65.5 kg)    Body mass index is 22.62 kg/m.  Performance status (ECOG): 1 - Symptomatic but completely ambulatory  PHYSICAL EXAM:   Physical Exam Vitals and nursing note reviewed. Exam conducted with a chaperone present.  Constitutional:      Appearance: Normal appearance.  Cardiovascular:     Rate and Rhythm: Normal rate and regular rhythm.     Pulses: Normal pulses.     Heart sounds: Normal heart sounds.  Pulmonary:     Effort: Pulmonary effort is normal.     Breath sounds: Normal breath sounds.  Abdominal:     Palpations: Abdomen is soft. There is no hepatomegaly, splenomegaly or mass.     Tenderness: There is no abdominal tenderness.  Musculoskeletal:     Right lower leg: No edema.     Left lower leg: No edema.  Lymphadenopathy:     Cervical: No cervical adenopathy.     Right cervical: No superficial, deep or posterior cervical adenopathy.     Left cervical: No  superficial, deep or posterior cervical adenopathy.     Upper Body:     Right upper body: No supraclavicular or axillary adenopathy.     Left upper body: No supraclavicular or axillary adenopathy.  Neurological:     General: No focal deficit present.     Mental Status: She is alert and oriented to person, place, and time.  Psychiatric:        Mood and Affect: Mood normal.        Behavior: Behavior normal.     LABS:      Latest Ref Rng & Units 07/03/2023    8:46 PM 05/18/2023    6:52 PM 04/19/2023    3:36 AM  CBC  WBC 4.0 - 10.5 K/uL 12.0  11.6  12.1   Hemoglobin 12.0 - 15.0 g/dL 46.9  62.9  52.8   Hematocrit 36.0 - 46.0 % 38.0  37.0  37.5   Platelets 150 - 400 K/uL 241  238  243       Latest Ref Rng & Units 07/03/2023    8:46 PM 05/29/2023   10:06 AM 05/18/2023    6:52 PM  CMP  Glucose 70 - 99 mg/dL 413  244  010   BUN 8 - 23 mg/dL 25  16  19    Creatinine 0.44 - 1.00 mg/dL 2.72  5.36  6.44   Sodium 135 - 145 mmol/L 128  135  134   Potassium 3.5 - 5.1 mmol/L 4.8  4.3  3.6   Chloride 98 - 111 mmol/L 96  101  105   CO2 22 - 32 mmol/L 22  22  22    Calcium 8.9 - 10.3 mg/dL 9.5  9.8  9.9   Total Protein 6.5 - 8.1 g/dL 7.2   7.2   Total Bilirubin 0.3 - 1.2 mg/dL 0.4   0.7   Alkaline Phos 38 - 126 U/L 147   63   AST 15 - 41 U/L 62   28   ALT 0 - 44 U/L 79   29      No results found for: "CEA1", "CEA" / No results found for: "CEA1", "CEA" No results found for: "PSA1" No results found for: "IHK742" No results found for: "CAN125"  No results found for: "TOTALPROTELP", "ALBUMINELP", "A1GS", "A2GS", "BETS", "BETA2SER", "GAMS", "MSPIKE", "SPEI" Lab Results  Component Value Date   TIBC 364 07/14/2023   FERRITIN 341 (H) 07/14/2023   IRONPCTSAT 18 07/14/2023   No results found for: "LDH"   STUDIES:   No results found.

## 2023-10-05 NOTE — Patient Instructions (Addendum)
[facility] at [facility] Discharge Instructions   You were seen and examined today by Dr. Ellin Saba.  He reviewed the results of your D-dimer from today. It is positive at 0.55. 0.5 or less is normal. The last two times we have checked this test it has been negative.   Continue Eliquis twice a day as prescribed.   We will see you back in 6 months. We will repeat lab work prior to this visit.   Return as scheduled.    Thank you for choosing [facility] at [facility] to provide your oncology and hematology care.  To afford each patient quality time with our provider, please arrive at least 15 minutes before your scheduled appointment time.   If you have a lab appointment with the Cancer Center please come in thru the Main Entrance and check in at the main information desk.  You need to re-schedule your appointment should you arrive 10 or more minutes late.  We strive to give you quality time with our providers, and arriving late affects you and other patients whose appointments are after yours.  Also, if you no show three or more times for appointments you may be dismissed from the clinic at the providers discretion.     Again, thank you for choosing [facility].  Our hope is that these requests will decrease the amount of time that you wait before being seen by our physicians.       _____________________________________________________________  Should you have questions after your visit to [facility], please contact our office at [phone] and follow the prompts.  Our office hours are 8:00 a.m. and 4:30 p.m. Monday - Friday.  Please note that voicemails left after 4:00 p.m. may not be returned until the following business day.  We are closed weekends and major holidays.  You do have access to a nurse 24-7, just call the main number to the clinic [phone] and do not press any options, hold on the line and a nurse  will answer the phone.    For prescription refill requests, have your pharmacy contact our office and allow 72 hours.    Due to Covid, you will need to wear a mask upon entering the hospital. If you do not have a mask, a mask will be given to you at the Main Entrance upon arrival. For doctor visits, patients may have 1 support person age 78 or older with them. For treatment visits, patients can not have anyone with them due to social distancing guidelines and our immunocompromised population.

## 2023-10-06 ENCOUNTER — Other Ambulatory Visit: Payer: Self-pay | Admitting: Internal Medicine

## 2023-10-07 ENCOUNTER — Ambulatory Visit: Payer: 59 | Admitting: Internal Medicine

## 2023-12-01 ENCOUNTER — Other Ambulatory Visit: Payer: Self-pay | Admitting: Internal Medicine

## 2023-12-30 ENCOUNTER — Other Ambulatory Visit (HOSPITAL_COMMUNITY): Payer: Self-pay | Admitting: Nurse Practitioner

## 2023-12-30 DIAGNOSIS — Z1231 Encounter for screening mammogram for malignant neoplasm of breast: Secondary | ICD-10-CM

## 2024-01-01 ENCOUNTER — Encounter: Payer: Self-pay | Admitting: Internal Medicine

## 2024-01-01 ENCOUNTER — Ambulatory Visit: Payer: Medicare HMO | Attending: Internal Medicine | Admitting: Internal Medicine

## 2024-01-01 NOTE — Progress Notes (Signed)
 Erroneous encounter - please disregard.

## 2024-01-11 ENCOUNTER — Encounter: Payer: Self-pay | Admitting: Obstetrics & Gynecology

## 2024-01-11 ENCOUNTER — Other Ambulatory Visit (HOSPITAL_COMMUNITY)
Admission: RE | Admit: 2024-01-11 | Discharge: 2024-01-11 | Disposition: A | Discharge status: Home / Self Care | Source: Ambulatory Visit | Attending: Obstetrics & Gynecology | Admitting: Obstetrics & Gynecology

## 2024-01-11 ENCOUNTER — Ambulatory Visit: Payer: Medicare Other | Admitting: Obstetrics & Gynecology

## 2024-01-11 VITALS — BP 101/59 | HR 86 | Ht 63.5 in | Wt 133.0 lb

## 2024-01-11 DIAGNOSIS — E1165 Type 2 diabetes mellitus with hyperglycemia: Secondary | ICD-10-CM

## 2024-01-11 DIAGNOSIS — N898 Other specified noninflammatory disorders of vagina: Secondary | ICD-10-CM | POA: Insufficient documentation

## 2024-01-11 DIAGNOSIS — N761 Subacute and chronic vaginitis: Secondary | ICD-10-CM | POA: Diagnosis not present

## 2024-01-11 MED ORDER — FLUCONAZOLE 150 MG PO TABS: ORAL_TABLET | ORAL | 0 refills | Status: DC

## 2024-01-11 MED ORDER — FLUCONAZOLE 150 MG PO TABS: ORAL_TABLET | ORAL | 1 refills | Status: DC

## 2024-01-11 NOTE — Progress Notes (Signed)
   GYN VISIT Patient name: Summer Christensen MRN [id number]  Date of birth: [birth year] Chief Complaint:   Vaginal Itching  History of Present Illness:   Summer Christensen is a 73 y.o. G35P3003 PM, PH female being seen today for the following concerns:     Chronic vaginitis: notes significant vaginal itching and irritation that has been going on for the past year.  On occasion symptoms will get a little better after using the cream from her PCP.    Notes that in the am she will wake up with white discharge.    Upon further review, she notes recent diagnosis of uncontrolled T2DM- A1c 15  -h/o CHF and DVT  No LMP recorded. Patient has had a hysterectomy.    Review of Systems:   Pertinent items are noted in HPI Denies fever/chills, dizziness, headaches, visual disturbances, fatigue, shortness of breath, chest pain, abdominal pain, vomiting. Pertinent History Reviewed:   Past Surgical History:  Procedure Laterality Date   ABDOMINAL HYSTERECTOMY     COLONOSCOPY N/A 06/11/2016   Procedure: COLONOSCOPY;  Surgeon: Malissa Hippo, MD;  [facility];  Service: Endoscopy;  Laterality: N/A;  2:00 - moved to 1:00 - Ann notified pt   LAPAROSCOPIC CHOLECYSTECTOMY  2005   pain.   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 Physical Assessment:   Vitals:   01/11/24 1515  BP: (!) 101/59  Pulse: 86  Weight: 133 lb (60.3 kg)  Height: 5' 3.5" (1.613 m)  Body mass index is 23.19 kg/m.       Physical Examination:   General appearance: alert, well appearing, and in no distress  Psych: mood appropriate, normal affect  Skin: warm & dry   Cardiovascular: normal heart rate noted  Respiratory: normal respiratory effort, no  distress  Abdomen: soft, non-tender   Pelvic: VULVA: normal appearing vulva with no masses, tenderness or lesions, VAGINA: normal appearing vagina with normal color and discharge, no lesions  Extremities: no edema   Chaperone: Faith Rogue    Assessment & Plan:  1) Chronic vaginitis -no abnormalities noted on exam -based on clinical history concern for chronic yeast -vaginitis panel obtained as there was minimal abnormalities noted on exam -will start with Diflucan and f/u in 3 mos  2) Uncontrolled DM -long discussion with patient regarding DM and diet changes -encouraged pt to read more about DM and consider DM cookbook -Step #1 cutting out sweet tea   No orders of the defined types were placed in this encounter.   Return in about 3 months (around 04/12/2024) for Medication follow up.   Myna Hidalgo, DO Attending Obstetrician & Gynecologist, [facility] for [employer], [facility]

## 2024-01-12 LAB — CERVICOVAGINAL ANCILLARY ONLY
Bacterial Vaginitis (gardnerella): NEGATIVE
Candida Glabrata: NEGATIVE
Candida Vaginitis: POSITIVE — AB
Comment: NEGATIVE
Comment: NEGATIVE
Comment: NEGATIVE

## 2024-02-24 ENCOUNTER — Telehealth (HOSPITAL_COMMUNITY): Payer: Self-pay | Admitting: Cardiology

## 2024-02-24 NOTE — Telephone Encounter (Signed)
 Lmtcb to sch f/u appt w/ Dr Bruce Caper

## 2024-03-10 ENCOUNTER — Ambulatory Visit: Admitting: Internal Medicine

## 2024-03-10 NOTE — Progress Notes (Signed)
 Erroneous encounter - please disregard.

## 2024-03-28 ENCOUNTER — Ambulatory Visit: Attending: Internal Medicine | Admitting: Internal Medicine

## 2024-03-28 ENCOUNTER — Encounter: Payer: Self-pay | Admitting: Internal Medicine

## 2024-03-28 ENCOUNTER — Inpatient Hospital Stay: Payer: Medicare HMO | Attending: Hematology

## 2024-03-28 VITALS — BP 118/62 | HR 79 | Ht 64.0 in | Wt 142.2 lb

## 2024-03-28 DIAGNOSIS — I5022 Chronic systolic (congestive) heart failure: Secondary | ICD-10-CM | POA: Diagnosis not present

## 2024-03-28 DIAGNOSIS — I447 Left bundle-branch block, unspecified: Secondary | ICD-10-CM | POA: Insufficient documentation

## 2024-03-28 DIAGNOSIS — I10 Essential (primary) hypertension: Secondary | ICD-10-CM | POA: Diagnosis not present

## 2024-03-28 DIAGNOSIS — N189 Chronic kidney disease, unspecified: Secondary | ICD-10-CM | POA: Insufficient documentation

## 2024-03-28 DIAGNOSIS — Z7901 Long term (current) use of anticoagulants: Secondary | ICD-10-CM | POA: Insufficient documentation

## 2024-03-28 DIAGNOSIS — Z86711 Personal history of pulmonary embolism: Secondary | ICD-10-CM | POA: Insufficient documentation

## 2024-03-28 DIAGNOSIS — I509 Heart failure, unspecified: Secondary | ICD-10-CM | POA: Insufficient documentation

## 2024-03-28 DIAGNOSIS — Z86718 Personal history of other venous thrombosis and embolism: Secondary | ICD-10-CM | POA: Insufficient documentation

## 2024-03-28 NOTE — Patient Instructions (Addendum)
 Medication Instructions:  Your physician recommends that you continue on your current medications as directed. Please refer to the Current Medication list given to you today.   Labwork: None  Testing/Procedures: Your physician has requested that you have an echocardiogram. Echocardiography is a painless test that uses sound waves to create images of your heart. It provides your doctor with information about the size and shape of your heart and how well your heart's chambers and valves are working. This procedure takes approximately one hour. There are no restrictions for this procedure. Please do NOT wear cologne, perfume, aftershave, or lotions (deodorant is allowed). Please arrive 15 minutes prior to your appointment time.  Please note: We ask at that you not bring children with you during ultrasound (echo/ vascular) testing. Due to room size and safety concerns, children are not allowed in the ultrasound rooms during exams. Our front office staff cannot provide observation of children in our lobby area while testing is being conducted. An adult accompanying a patient to their appointment will only be allowed in the ultrasound room at the discretion of the ultrasound technician under special circumstances. We apologize for any inconvenience.   Follow-Up: Your physician recommends that you schedule a follow-up appointment in: 8 months  Any Other Special Instructions Will Be Listed Below (If Applicable). Thank you for choosing [facility]!     If you need a refill on your cardiac medications before your next appointment, please call your pharmacy.

## 2024-03-28 NOTE — Progress Notes (Signed)
 Cardiology Office Note  Date: 03/28/2024   ID: Summer Christensen, Summer Christensen 1951/04/24, MRN [id number]  PCP:  [facility]  Cardiologist:  Summer Christensen P Summer Giebel, MD Electrophysiologist:  Summer Grange, MD   History of Present Illness: Summer Christensen is a 73 y.o. female  Continues to have stable DOE and fatigue.  No angina, dizziness, palpitations, syncope or leg swelling.  Her niece and ex-husband passed away in 11/21/2024due to which she missed appointments with cardiology.  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   ROTATOR CUFF REPAIR  2009   arthroscopic   TUBAL LIGATION     bilateral    Current Outpatient Medications  Medication Sig Dispense Refill   apixaban  (ELIQUIS ) 5 MG TABS tablet Take 1 tablet twice daily 60 tablet 5   carvedilol  (COREG ) 12.5 MG tablet TAKE ONE TABLET BY MOUTH TWICE DAILY 180 tablet 2   furosemide  (LASIX ) 40 MG tablet TAKE 40MG  DAILY, MAY TAKE ADDITIONAL DOSE OF LASIX  40MG  AS NEEDED FOR SWELLING 180 tablet 3   JANUVIA 25 MG tablet Take 25 mg by mouth daily.     potassium chloride  SA (KLOR-CON  M) 20 MEQ tablet Take 20 mEq by mouth as needed.     sacubitril-valsartan (ENTRESTO ) 97-103 MG Take 1 tablet by mouth 2 (two) times daily. 60 tablet 6   spironolactone  (ALDACTONE ) 25 MG tablet TAKE ONE TABLET BY MOUTH EVERY DAY 90 tablet 3   digoxin  (LANOXIN ) 0.125 MG tablet Take 1 tablet (0.125 mg total) by mouth daily. (Patient not taking: Reported on 03/28/2024) 30 tablet 3   Furosemide   (FUROSCIX ) 80 MG/10ML CTKT Inject 80 mg into the skin as needed. (Patient not taking: Reported on 03/28/2024) 2 each 0   No current facility-administered medications for this visit.   Allergies:  Jardiance  [empagliflozin ] and Metformin  and related   Social History: The patient  reports that she has never smoked. She has never been exposed to tobacco smoke. She has never used smokeless tobacco. She reports that she does not drink alcohol and does not use drugs.   Family History: The patient's family history includes Heart attack in her father; Stroke in her mother.   ROS:  Please see the history of present illness. Otherwise, complete review of systems is positive for none  All other systems are reviewed and negative.   Physical Exam: VS:  BP 118/62   Pulse 79   Ht 5\' 4"  (1.626 m)   Wt 142 lb 3.2 oz (64.5 kg)   SpO2 97%   BMI 24.41 kg/m , BMI Body mass index is 24.41 kg/m.  Wt Readings from Last 3 Encounters:  03/28/24 142 lb 3.2 oz (64.5 kg)  01/11/24 133 lb (60.3 kg)  10/05/23 131 lb 12.8 oz (59.8 kg)    General: Patient appears comfortable at rest. HEENT: Conjunctiva and lids normal, oropharynx clear with moist mucosa. Neck: Supple, no elevated JVP  or carotid bruits, no thyromegaly. Lungs: Clear to auscultation, nonlabored breathing at rest. Cardiac: Regular rate and rhythm, no S3 or significant systolic murmur, no pericardial rub. Abdomen: Soft, nontender, no hepatomegaly, bowel sounds present, no guarding or rebound. Extremities: No pitting edema, distal pulses 2+. Skin: Warm and dry. Musculoskeletal: No kyphosis. Neuropsychiatric: Alert and oriented x3, affect grossly appropriate.  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  No angina.  Not taking digoxin .  Continue GDMT, carvedilol  12.5 mg twice daily, Entresto  97-103 mg twice daily, spironolactone  25 mg once daily.  Did not tolerate SGLT2 inhibitors due to itching/swelling of her buttocks.  EKG showed NSR, LBBB.  Cardiac MRI showed LBBB induced cardiomyopathy.  She will make an appointment with Dr. Arlester Ladd to discuss about CRT-D, will repeat echocardiogram as the last LVEF assessment was in November 2024.  She is currently following with advanced heart failure as well.  Pulm embolism and DVT in 2023, provoked: Follows with hematology.  Recommendation was to continue systemic AC.  Liver mass on CT cardiac in 2023: Suspected mass lesion in the right lobe of liver concerning for tumor, MR imaging with and without contrast was recommended, follow-up with PCP.       Medication Adjustments/Labs and Tests Ordered: Current medicines are reviewed at length with the patient today.  Concerns regarding medicines are outlined above.    Disposition:  Follow up 8 months  Signed Louann Hopson Priya Lirio Bach, MD, 03/28/2024 12:42 PM    [facility] at [facility] [address] [place], [place], [place] [address]

## 2024-03-29 ENCOUNTER — Ambulatory Visit: Attending: Internal Medicine

## 2024-03-29 ENCOUNTER — Inpatient Hospital Stay

## 2024-03-29 DIAGNOSIS — N189 Chronic kidney disease, unspecified: Secondary | ICD-10-CM | POA: Diagnosis not present

## 2024-03-29 DIAGNOSIS — Z7901 Long term (current) use of anticoagulants: Secondary | ICD-10-CM | POA: Diagnosis not present

## 2024-03-29 DIAGNOSIS — I5022 Chronic systolic (congestive) heart failure: Secondary | ICD-10-CM | POA: Diagnosis not present

## 2024-03-29 DIAGNOSIS — I82409 Acute embolism and thrombosis of unspecified deep veins of unspecified lower extremity: Secondary | ICD-10-CM

## 2024-03-29 DIAGNOSIS — D508 Other iron deficiency anemias: Secondary | ICD-10-CM

## 2024-03-29 DIAGNOSIS — Z86718 Personal history of other venous thrombosis and embolism: Secondary | ICD-10-CM | POA: Diagnosis not present

## 2024-03-29 DIAGNOSIS — Z86711 Personal history of pulmonary embolism: Secondary | ICD-10-CM | POA: Diagnosis present

## 2024-03-29 DIAGNOSIS — I509 Heart failure, unspecified: Secondary | ICD-10-CM | POA: Diagnosis not present

## 2024-03-29 LAB — ECHOCARDIOGRAM COMPLETE
AR max vel: 1.94 cm2
AV Area VTI: 1.82 cm2
AV Area mean vel: 1.83 cm2
AV Mean grad: 5 mmHg
AV Peak grad: 8.4 mmHg
Ao pk vel: 1.45 m/s
Area-P 1/2: 2.74 cm2
Calc EF: 23.4 %
MV M vel: 5.45 m/s
MV Peak grad: 118.8 mmHg
MV VTI: 2.07 cm2
S' Lateral: 5.2 cm
Single Plane A2C EF: 23.1 %
Single Plane A4C EF: 22.1 %

## 2024-03-29 LAB — CBC
HCT: 38.5 % (ref 36.0–46.0)
Hemoglobin: 12.5 g/dL (ref 12.0–15.0)
MCH: 28 pg (ref 26.0–34.0)
MCHC: 32.5 g/dL (ref 30.0–36.0)
MCV: 86.1 fL (ref 80.0–100.0)
Platelets: 213 10*3/uL (ref 150–400)
RBC: 4.47 MIL/uL (ref 3.87–5.11)
RDW: 12.8 % (ref 11.5–15.5)
WBC: 9.1 10*3/uL (ref 4.0–10.5)
nRBC: 0 % (ref 0.0–0.2)

## 2024-03-29 LAB — FERRITIN: Ferritin: 100 ng/mL (ref 11–307)

## 2024-03-29 LAB — IRON AND TIBC
Iron: 55 ug/dL (ref 28–170)
Saturation Ratios: 15 % (ref 10.4–31.8)
TIBC: 371 ug/dL (ref 250–450)
UIBC: 316 ug/dL

## 2024-03-29 LAB — D-DIMER, QUANTITATIVE: D-Dimer, Quant: 0.4 ug{FEU}/mL (ref 0.00–0.50)

## 2024-04-01 ENCOUNTER — Ambulatory Visit: Payer: Self-pay | Admitting: Internal Medicine

## 2024-04-04 ENCOUNTER — Inpatient Hospital Stay (HOSPITAL_BASED_OUTPATIENT_CLINIC_OR_DEPARTMENT_OTHER): Payer: Medicare HMO | Admitting: Hematology

## 2024-04-04 VITALS — BP 93/65 | HR 85 | Temp 98.7°F | Resp 16 | Wt 143.3 lb

## 2024-04-04 DIAGNOSIS — I82409 Acute embolism and thrombosis of unspecified deep veins of unspecified lower extremity: Secondary | ICD-10-CM | POA: Diagnosis not present

## 2024-04-04 DIAGNOSIS — D508 Other iron deficiency anemias: Secondary | ICD-10-CM | POA: Diagnosis not present

## 2024-04-04 DIAGNOSIS — Z86711 Personal history of pulmonary embolism: Secondary | ICD-10-CM | POA: Diagnosis not present

## 2024-04-04 NOTE — Telephone Encounter (Signed)
 The patient has been notified of the result and verbalized understanding.  All questions (if any) were answered. Camilo Cella, [place] 04/04/2024 1:25 PM

## 2024-04-04 NOTE — Progress Notes (Signed)
 [facility] [address], [place] [address]    Clinic Day:  04/04/2024  Referring physician: [facility]  Patient Care Team: [facility] as PCP - General Mallipeddi, Kennyth Pean, MD as PCP - Cardiology (Cardiology) Mealor, Donnamae Gaba, MD as PCP - Electrophysiology (Cardiology)   ASSESSMENT & PLAN:   Assessment: 1.  Provoked DVT and PE: - Hospitalization from 07/29/2022 - 08/04/2022 with CHF, pneumonia and sepsis - CT angio chest (08/10/2022): Small left upper lobe segmental pulmonary artery emboli.  No right heart strain. - Ultrasound Doppler (08/11/2022): DVT in the right calf vein and in the left popliteal and peroneal veins.  Superficial thrombophlebitis of the right GSV at the level of the knee extending into some communicating superficial varicosities. - She has been on Eliquis  since then. - Denies any history of miscarriages or prior DVT. - Doppler on 05/20/2023: Negative for DVT. - Hypercoagulable workup: Negative for LA/factor V Leiden/prothrombin gene mutation.   2. Social/Family History: - Lives alone at home. Retired post Psychologist, clinical. No tobacco use or ETOH.  Is independent of ADLs and IADLs. - Mother had DVTs and PEs, as well as varicose veins. Maternal niece has lung cancer metastasized to the brain and sister has lung cancer.     Plan: 1.  Provoked DVT and PE: - She is tolerating Eliquis  twice daily without any bleeding issues.  No falls reported. - Latest echocardiogram on 03/29/2024: LVEF 25-30%. - She is being evaluated for AICD placement. - D-dimer today is normal at 0.4. - Given her severe CHF and decreased functional status, recommend continuing anticoagulation indefinitely at this time.  If her CHF and functional status improves with or without AC replacement, may discontinue anticoagulation in the future.  RTC 1 year for follow-up with repeat D-dimer.    2.  Borderline to low iron levels  in the setting of CKD: - She complains of fatigue.  Ferritin is 100 and saturation is 15.  She has CKD.  Hemoglobin is normal at 12.5. - Recommend starting iron tablet once daily on Monday/Wednesday/Friday to see if it helps improving the fatigue.  Will repeat ferritin and iron panel next visit.    Orders Placed This Encounter  Procedures   CBC    Standing Status:   Future    Expected Date:   03/27/2025    Expiration Date:   04/03/2025   Iron and TIBC ([facility] ONLY)    Standing Status:   Future    Expected Date:   03/27/2025    Expiration Date:   04/03/2025   Ferritin    Standing Status:   Future    Expected Date:   03/27/2025    Expiration Date:   04/03/2025   D-dimer, quantitative    Standing Status:   Future    Expected Date:   03/27/2025    Expiration Date:   04/03/2025      [name],acting as a scribe for Paulett Boros, MD.,have documented all relevant documentation on the behalf of Paulett Boros, MD,as directed by  Paulett Boros, MD while in the presence of Paulett Boros, MD.  I, Paulett Boros MD, have reviewed the above documentation for accuracy and completeness, and I agree with the above.     Paulett Boros, MD   6/16/20252:47 PM  CHIEF COMPLAINT:   Diagnosis: DVT and PE   Cancer Staging  No matching staging information was found for the patient.  Prior Therapy: None  Current Therapy:  Eliquis     HISTORY OF PRESENT ILLNESS:   Oncology History   No history exists.     INTERVAL HISTORY:   Summer Christensen is a 73 y.o. female presenting to clinic today for follow up of DVT and PE. She was last seen by me on 10/05/23.  Today, she states that she is doing well overall. Her appetite level is at 50%. Her energy level is at 50%.  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   ROTATOR CUFF REPAIR  2009   arthroscopic   TUBAL LIGATION     bilateral    Social History: Social History   Socioeconomic History   Marital status: Divorced    Spouse name: Not on file   Number of children: 3   Years of education: Not on file   Highest education level: Not on file  Occupational History   Occupation: post office clerk    Employer: [employer]  Tobacco Use   Smoking status: Never    Passive exposure: Never   Smokeless tobacco: Never  Vaping Use   Vaping status: Never Used  Substance and Sexual Activity   Alcohol use: No   Drug use: No   Sexual activity: Never    Birth control/protection: Surgical    Comment: hyst  Other Topics Concern   Not on file  Social History Narrative   Not on file   Social Drivers of Health   Financial Resource Strain: Not on file  Food Insecurity: No Food Insecurity (05/13/2023)   Hunger Vital Sign    Worried About Running Out of Food in the Last Year: Never true    Ran Out of Food in the Last Year: Never true  Transportation Needs: No Transportation Needs (05/13/2023)   PRAPARE - Administrator, Civil Service (Medical): No    Lack of Transportation (Non-Medical): No  Physical Activity: Not on file  Stress: Not on file  Social Connections: Not on file  Intimate Partner Violence: Not At Risk (05/13/2023)   Humiliation, Afraid, Rape, and Kick questionnaire    Fear of Current or Ex-Partner: No    Emotionally Abused: No    Physically Abused: No    Sexually Abused: No    Family History: Family History  Problem Relation  Age of Onset   Stroke Mother    Heart attack Father     Current Medications:  Current Outpatient Medications:    apixaban  (ELIQUIS ) 5 MG TABS tablet, Take 1 tablet twice daily, Disp: 60 tablet, Rfl: 5   carvedilol  (COREG ) 12.5 MG tablet, TAKE ONE TABLET BY MOUTH TWICE DAILY, Disp: 180 tablet, Rfl: 2   digoxin  (LANOXIN ) 0.125 MG tablet, Take 1 tablet (0.125 mg total) by mouth daily., Disp: 30 tablet, Rfl: 3   Furosemide  (FUROSCIX ) 80 MG/10ML CTKT, Inject 80 mg into the skin as  needed., Disp: 2 each, Rfl: 0   furosemide  (LASIX ) 40 MG tablet, TAKE 40MG  DAILY, MAY TAKE ADDITIONAL DOSE OF LASIX  40MG  AS NEEDED FOR SWELLING, Disp: 180 tablet, Rfl: 3   JANUVIA 25 MG tablet, Take 25 mg by mouth daily., Disp: , Rfl:    potassium chloride  SA (KLOR-CON  M) 20 MEQ tablet, Take 20 mEq by mouth as needed., Disp: , Rfl:    sacubitril-valsartan (ENTRESTO ) 97-103 MG, Take 1 tablet by mouth 2 (two) times daily., Disp: 60 tablet, Rfl: 6   spironolactone  (ALDACTONE ) 25 MG tablet, TAKE ONE TABLET BY MOUTH EVERY DAY, Disp: 90 tablet, Rfl: 3   Allergies: Allergies  Allergen Reactions   Jardiance  [Empagliflozin ] Itching and Swelling   Metformin  Dermatitis   Metformin  And Related Itching and Rash    REVIEW OF SYSTEMS:   Review of Systems  Constitutional:  Negative for chills, fatigue and fever.  HENT:   Negative for lump/mass, mouth sores, nosebleeds, sore throat and trouble swallowing.   Eyes:  Negative for eye problems.  Respiratory:  Negative for cough and shortness of breath.   Cardiovascular:  Negative for chest pain, leg swelling and palpitations.  Gastrointestinal:  Negative for abdominal pain, constipation, diarrhea, nausea and vomiting.  Genitourinary:  Negative for bladder incontinence, difficulty urinating, dysuria, frequency, hematuria and nocturia.   Musculoskeletal:  Negative for arthralgias, back pain, flank pain, myalgias and neck pain.  Skin:  Negative for itching and rash.  Neurological:   Negative for dizziness, headaches and numbness.  Hematological:  Does not bruise/bleed easily.  Psychiatric/Behavioral:  Positive for sleep disturbance. Negative for depression and suicidal ideas. The patient is not nervous/anxious.   All other systems reviewed and are negative.    VITALS:   Blood pressure 93/65, pulse 85, temperature 98.7 F (37.1 C), temperature source Oral, resp. rate 16, weight 143 lb 4.8 oz (65 kg), SpO2 97%.  Wt Readings from Last 3 Encounters:  04/04/24 143 lb 4.8 oz (65 kg)  03/28/24 142 lb 3.2 oz (64.5 kg)  01/11/24 133 lb (60.3 kg)    Body mass index is 24.6 kg/m.  Performance status (ECOG): 1 - Symptomatic but completely ambulatory  PHYSICAL EXAM:   Physical Exam Vitals and nursing note reviewed. Exam conducted with a chaperone present.  Constitutional:      Appearance: Normal appearance.   Cardiovascular:     Rate and Rhythm: Normal rate and regular rhythm.     Pulses: Normal pulses.     Heart sounds: Normal heart sounds.  Pulmonary:     Effort: Pulmonary effort is normal.     Breath sounds: Normal breath sounds.  Abdominal:     Palpations: Abdomen is soft. There is no hepatomegaly, splenomegaly or mass.     Tenderness: There is no abdominal tenderness.   Musculoskeletal:     Right lower leg: No edema.     Left lower leg: No edema.  Lymphadenopathy:     Cervical: No cervical adenopathy.     Right cervical: No superficial, deep or posterior cervical adenopathy.    Left cervical: No superficial, deep or posterior cervical adenopathy.     Upper Body:     Right upper body: No supraclavicular or axillary adenopathy.     Left upper body: No supraclavicular or axillary adenopathy.   Neurological:     General: No focal deficit present.     Mental Status: She is alert and oriented to person, place, and time.   Psychiatric:  Mood and Affect: Mood normal.        Behavior: Behavior normal.     LABS:      Latest Ref Rng & Units  03/29/2024    9:05 AM 07/03/2023    8:46 PM 05/18/2023    6:52 PM  CBC  WBC 4.0 - 10.5 K/uL 9.1  12.0  11.6   Hemoglobin 12.0 - 15.0 g/dL 31.5  17.6  16.0   Hematocrit 36.0 - 46.0 % 38.5  38.0  37.0   Platelets 150 - 400 K/uL 213  241  238       Latest Ref Rng & Units 07/03/2023    8:46 PM 05/29/2023   10:06 AM 05/18/2023    6:52 PM  CMP  Glucose 70 - 99 mg/dL 737  106  269   BUN 8 - 23 mg/dL 25  16  19    Creatinine 0.44 - 1.00 mg/dL 4.85  4.62  7.03   Sodium 135 - 145 mmol/L 128  135  134   Potassium 3.5 - 5.1 mmol/L 4.8  4.3  3.6   Chloride 98 - 111 mmol/L 96  101  105   CO2 22 - 32 mmol/L 22  22  22    Calcium  8.9 - 10.3 mg/dL 9.5  9.8  9.9   Total Protein 6.5 - 8.1 g/dL 7.2   7.2   Total Bilirubin 0.3 - 1.2 mg/dL 0.4   0.7   Alkaline Phos 38 - 126 U/L 147   63   AST 15 - 41 U/L 62   28   ALT 0 - 44 U/L 79   29      No results found for: CEA1, CEA / No results found for: CEA1, CEA No results found for: PSA1 No results found for: JKK938 No results found for: CAN125  No results found for: Tanner Fanny, A1GS, A2GS, Arlette Lagos, GAMS, MSPIKE, SPEI Lab Results  Component Value Date   TIBC 371 03/29/2024   TIBC 364 07/14/2023   FERRITIN 100 03/29/2024   FERRITIN 341 (H) 07/14/2023   IRONPCTSAT 15 03/29/2024   IRONPCTSAT 18 07/14/2023   No results found for: LDH   STUDIES:   ECHOCARDIOGRAM COMPLETE Result Date: 03/29/2024    ECHOCARDIOGRAM REPORT   Patient Name:   Summer Christensen Date of Exam: 03/29/2024 Medical Rec #:  [id number]     Height:       64.0 in Accession #:    [id number]    Weight:       142.2 lb Date of Birth:  [birth year]     BSA:          1.692 m Patient Age:    72 years      BP:           118/62 mmHg Patient Gender: F             HR:           64 bpm. Exam Location:  [facility] Procedure: 2D Echo, Cardiac Doppler, Color Doppler, 3D Echo and Strain Analysis            (Both Spectral and Color Flow Doppler were utilized  during            procedure). Indications:    I50.22 Chronic systolic (congestive) heart failure  History:        Patient has prior history of Echocardiogram examinations, most  recent 07/27/2023. CHF, Arrythmias:LBBB; Risk                 Factors:Hypertension, Diabetes, Dyslipidemia and Non-Smoker.  Sonographer:    Reed Canes RCS, RVS Referring Phys: [id number] VISHNU P MALLIPEDDI IMPRESSIONS  1. Left ventricular ejection fraction, by estimation, is 25 to 30%. Left ventricular ejection fraction by 3D volume is 37 %. The left ventricle has severely decreased function. The left ventricle demonstrates global hypokinesis. The left ventricular internal cavity size was moderately dilated. Left ventricular diastolic parameters are consistent with Grade I diastolic dysfunction (impaired relaxation). Elevated left atrial pressure. The average left ventricular global longitudinal strain is -11.0 %.  The global longitudinal strain is abnormal.  2. Right ventricular systolic function is normal. The right ventricular size is normal. There is normal pulmonary artery systolic pressure.  3. The mitral valve is normal in structure. Mild mitral valve regurgitation. No evidence of mitral stenosis.  4. The aortic valve is tricuspid. Aortic valve regurgitation is not visualized. No aortic stenosis is present.  5. The inferior vena cava is normal in size with greater than 50% respiratory variability, suggesting right atrial pressure of 3 mmHg. Comparison(s): A prior study was performed on 07/27/2023. EF 30-35%. Mild LVH. Mild mitral regurgitation. FINDINGS  Left Ventricle: Left ventricular ejection fraction, by estimation, is 25 to 30%. Left ventricular ejection fraction by 3D volume is 37 %. The left ventricle has severely decreased function. The left ventricle demonstrates global hypokinesis. The average  left ventricular global longitudinal strain is -11.0 %. Strain was performed and the global longitudinal strain is  abnormal. The left ventricular internal cavity size was moderately dilated. There is no left ventricular hypertrophy. Left ventricular diastolic parameters are consistent with Grade I diastolic dysfunction (impaired relaxation). Elevated left atrial pressure. Right Ventricle: The right ventricular size is normal. No increase in right ventricular wall thickness. Right ventricular systolic function is normal. There is normal pulmonary artery systolic pressure. The tricuspid regurgitant velocity is 2.80 m/s, and  with an assumed right atrial pressure of 3 mmHg, the estimated right ventricular systolic pressure is 34.4 mmHg. Left Atrium: Left atrial size was normal in size. Right Atrium: Right atrial size was normal in size. Pericardium: There is no evidence of pericardial effusion. Mitral Valve: The mitral valve is normal in structure. Mild mitral valve regurgitation. No evidence of mitral valve stenosis. MV peak gradient, 5.2 mmHg. The mean mitral valve gradient is 2.0 mmHg. Tricuspid Valve: The tricuspid valve is normal in structure. Tricuspid valve regurgitation is trivial. No evidence of tricuspid stenosis. Aortic Valve: The aortic valve is tricuspid. Aortic valve regurgitation is not visualized. No aortic stenosis is present. Aortic valve mean gradient measures 5.0 mmHg. Aortic valve peak gradient measures 8.4 mmHg. Aortic valve area, by VTI measures 1.82 cm. Pulmonic Valve: The pulmonic valve was normal in structure. Pulmonic valve regurgitation is trivial. No evidence of pulmonic stenosis. Aorta: The aortic root and ascending aorta are structurally normal, with no evidence of dilitation. Venous: The inferior vena cava is normal in size with greater than 50% respiratory variability, suggesting right atrial pressure of 3 mmHg. IAS/Shunts: No atrial level shunt detected by color flow Doppler. Additional Comments: 3D was performed not requiring image post processing on an independent workstation and was abnormal.   LEFT VENTRICLE PLAX 2D LVIDd:         6.10 cm         Diastology LVIDs:         5.20 cm  LV e' medial:    3.81 cm/s LV PW:         1.10 cm         LV E/e' medial:  18.2 LV IVS:        1.10 cm         LV e' lateral:   4.13 cm/s LVOT diam:     2.00 cm         LV E/e' lateral: 16.8 LV SV:         51 LV SV Index:   30              2D Longitudinal LVOT Area:     3.14 cm        Strain                                2D Strain GLS   -8.0 %                                (A4C): LV Volumes (MOD)               2D Strain GLS   -12.5 % LV vol d, MOD    109.0 ml      (A3C): A2C:                           2D Strain GLS   -12.5 % LV vol d, MOD    111.0 ml      (A2C): A4C:                           2D Strain GLS   -11.0 % LV vol s, MOD    83.8 ml       Avg: A2C: LV vol s, MOD    86.5 ml       3D Volume EF A4C:                           LV 3D EF:    Left LV SV MOD A2C:   25.2 ml                    ventricul LV SV MOD A4C:   111.0 ml                   ar LV SV MOD BP:    26.6 ml                    ejection                                             fraction                                             by 3D  volume is                                             37 %.                                 3D Volume EF:                                3D EF:        37 %                                LV EDV:       207 ml                                LV ESV:       130 ml                                LV SV:        77 ml RIGHT VENTRICLE            IVC RV Basal diam:  2.30 cm    IVC diam: 1.50 cm RV Mid diam:    1.90 cm RV S prime:     9.36 cm/s TAPSE (M-mode): 2.1 cm LEFT ATRIUM             Index        RIGHT ATRIUM           Index LA diam:        4.20 cm 2.48 cm/m   RA Area:     10.70 cm LA Vol (A2C):   46.5 ml 27.48 ml/m  RA Volume:   19.30 ml  11.40 ml/m LA Vol (A4C):   33.3 ml 19.68 ml/m LA Biplane Vol: 39.6 ml 23.40 ml/m  AORTIC VALVE                     PULMONIC VALVE AV Area  (Vmax):    1.94 cm      PV Vmax:          1.36 m/s AV Area (Vmean):   1.83 cm      PV Peak grad:     7.4 mmHg AV Area (VTI):     1.82 cm      PR End Diast Vel: 5.52 msec AV Vmax:           145.00 cm/s AV Vmean:          103.000 cm/s AV VTI:            0.279 m AV Peak Grad:      8.4 mmHg AV Mean Grad:      5.0 mmHg LVOT Vmax:         89.40 cm/s LVOT Vmean:        59.900 cm/s LVOT VTI:          0.162 m LVOT/AV VTI ratio: 0.58  AORTA Ao Root diam: 2.90 cm Ao Asc diam:  2.90 cm Ao Arch  diam: 3.0 cm MITRAL VALVE                TRICUSPID VALVE MV Area (PHT): 2.74 cm     TR Peak grad:   31.4 mmHg MV Area VTI:   2.07 cm     TR Vmax:        280.00 cm/s MV Peak grad:  5.2 mmHg MV Mean grad:  2.0 mmHg     SHUNTS MV Vmax:       1.14 m/s     Systemic VTI:  0.16 m MV Vmean:      70.2 cm/s    Systemic Diam: 2.00 cm MV Decel Time: 277 msec MR Peak grad: 118.8 mmHg MR Mean grad: 65.0 mmHg MR Vmax:      545.00 cm/s MR Vmean:     366.0 cm/s MV E velocity: 69.40 cm/s MV A velocity: 118.00 cm/s MV E/A ratio:  0.59 Vishnu Priya Mallipeddi Electronically signed by Lucetta Russel Mallipeddi Signature Date/Time: 03/29/2024/11:50:27 AM    Final

## 2024-04-04 NOTE — Telephone Encounter (Signed)
-----   Message from Vishnu P Mallipeddi sent at 04/01/2024  1:38 PM EDT ----- LV function is 25 to 30%, normal RV function, mild MR and no significant valvular heart disease.  CVP is 3 mmHg.  LV function remained the same.  She will need to see Dr. Arlester Ladd to discuss CRT-D  placement.  Please schedule appointment with Dr. Arlester Ladd. ----- Message ----- From: Interface, [employer] One Seven Sent: 03/29/2024  11:50 AM EDT To: Vishnu P Mallipeddi, MD

## 2024-04-04 NOTE — Patient Instructions (Addendum)
 [facility] at [facility] Discharge Instructions   You were seen and examined today by Dr. Cheree Cords.  He reviewed the results of your lab work which are normal/stable.   Continue Eliquis  twice a day as prescribed.   We will see you back in one year. We will repeat lab work prior to this visit.   Return as scheduled.    Thank you for choosing [facility] at [facility] to provide your oncology and hematology care.  To afford each patient quality time with our provider, please arrive at least 15 minutes before your scheduled appointment time.   If you have a lab appointment with the Cancer Center please come in thru the Main Entrance and check in at the main information desk.  You need to re-schedule your appointment should you arrive 10 or more minutes late.  We strive to give you quality time with our providers, and arriving late affects you and other patients whose appointments are after yours.  Also, if you no show three or more times for appointments you may be dismissed from the clinic at the providers discretion.     Again, thank you for choosing [facility].  Our hope is that these requests will decrease the amount of time that you wait before being seen by our physicians.       _____________________________________________________________  Should you have questions after your visit to [facility], please contact our office at [phone] and follow the prompts.  Our office hours are 8:00 a.m. and 4:30 p.m. Monday - Friday.  Please note that voicemails left after 4:00 p.m. may not be returned until the following business day.  We are closed weekends and major holidays.  You do have access to a nurse 24-7, just call the main number to the clinic [phone] and do not press any options, hold on the line and a nurse will answer the phone.    For prescription refill requests, have your pharmacy contact our office  and allow 72 hours.    Due to Covid, you will need to wear a mask upon entering the hospital. If you do not have a mask, a mask will be given to you at the Main Entrance upon arrival. For doctor visits, patients may have 1 support person age 22 or older with them. For treatment visits, patients can not have anyone with them due to social distancing guidelines and our immunocompromised population.

## 2024-04-15 ENCOUNTER — Other Ambulatory Visit: Payer: Self-pay | Admitting: Hematology

## 2024-04-20 ENCOUNTER — Ambulatory Visit: Attending: Cardiology | Admitting: Cardiovascular Disease

## 2024-04-20 ENCOUNTER — Ambulatory Visit: Admitting: Internal Medicine

## 2024-04-20 ENCOUNTER — Encounter: Payer: Self-pay | Admitting: Cardiovascular Disease

## 2024-04-20 VITALS — BP 120/56 | HR 83 | Ht 64.0 in | Wt 142.0 lb

## 2024-04-20 DIAGNOSIS — I447 Left bundle-branch block, unspecified: Secondary | ICD-10-CM | POA: Insufficient documentation

## 2024-04-20 DIAGNOSIS — I5022 Chronic systolic (congestive) heart failure: Secondary | ICD-10-CM | POA: Diagnosis present

## 2024-04-20 DIAGNOSIS — I428 Other cardiomyopathies: Secondary | ICD-10-CM | POA: Diagnosis not present

## 2024-04-20 NOTE — H&P (View-Only) (Signed)
 Electrophysiology Office Note:    Date:  04/20/2024   ID:  Niccole, Witthuhn [birth year], MRN [id number]  PCP:  [facility]   [facility] Providers Cardiologist:  Diannah SHAUNNA Maywood, MD Electrophysiologist:  Eulas FORBES Furbish, MD     Referring MD: [facility]*   History of Present Illness:    Summer Christensen is a 73 y.o. female with a medical history significant for hypertension, nonischemic cardiomyopathy with EF 20 to 25%, pulmonary embolism referred to discuss ICD placement.     She was diagnosed with heart failure in October 2023 when admitted for septic shock secondary to pneumonia.  Echocardiogram showed an EF of 20 to 25%.  This was complicated by a right lower extremity DVT and left upper lobe pulmonary embolism.  Eliquis  was started and she has been following up in general cardiology clinic with Dr. Adelfa Gaunt and heart failure clinic with Dr. Gardenia.     Today, she reports that she continues to be fatigued and have very low energy.  EKGs/Labs/Other Studies Reviewed Today:    Echocardiogram:   TTE March 29, 2024 Ejection fraction 25 to 30%.  Grade 1 diastolic dysfunction.  TTE Feb 20, 2023 Ejection fraction 20 to 25% with dyssynchrony due to left bundle branch block  August 11, 2022 Ejection fraction 20 to 25%  TTE July 29, 2022 EF less than 20%.  Grade 3 diastolic dysfunction mild to moderate mitral regurgitation.   Monitors:   Stress testing:   Advanced imaging:  Cardiac MRI November 2024 LVEF 26%.   Coronary CTA September 23, 2022 1. Coronary calcium  score of 53.8. This was 67 percentile for age-, sex, and race-matched controls.  2. Normal coronary origin with right dominance.  3. Mild (25-49) calcified plaque in the ostial LAD.  4. Aortic atherosclerosis.  5. Left ventricular enlargement.  Cardiac catherization    EKG:   EKG Interpretation Date/Time:  Wednesday April 20 2024 14:51:12  EDT Ventricular Rate:  83 PR Interval:  192 QRS Duration:  152 QT Interval:  436 QTC Calculation: 512 R Axis:   133  Text Interpretation: Normal sinus rhythm Right axis deviation Left ventricular hypertrophy with QRS widening and repolarization abnormality ( Cornell product ) When compared with ECG of 28-Mar-2024 11:22, Right axis deviation is now present Confirmed by Furbish Eulas [id number]) on 04/20/2024 3:20:02 PM     Physical Exam:    VS:  BP (!) 120/56 (BP Location: Left Arm, Patient Position: Sitting, Cuff Size: Normal)   Pulse 83   Ht 5' 4 (1.626 m)   Wt 142 lb (64.4 kg)   SpO2 98%   BMI 24.37 kg/m     Wt Readings from Last 3 Encounters:  04/20/24 142 lb (64.4 kg)  04/04/24 143 lb 4.8 oz (65 kg)  03/28/24 142 lb 3.2 oz (64.5 kg)     GEN:  Well nourished, well developed in no acute distress CARDIAC: RRR, no murmurs, rubs, gallops RESPIRATORY:  Normal work of breathing MUSCULOSKELETAL: no edema    ASSESSMENT & PLAN:    CHFrEF, nonischemic cardiomyopathy Possibly left bundle branch block induced EF remains <30% despite GDMT With depressed EF and true left on a ranch block, I think it appropriate to place a CRT device  Continue carvedilol  12.5, digoxin  0.125, Entresto  97-103, spironolactone  25 We discussed the indication and rationale for CRT-D placement.  At this time, she would like to proceed  I discussed the indication for the  procedure and the logistics, risks, potential benefit, and after care. I specifically explained that risks include but are not limited to infection, bleeding,damage to blood vessels, lung, and the heart -- but risk of prolonged hospitalization, need for surgery, or the event of stroke, heart attack, or death are low but not zero.    LBBB Planning for CRT device  History of pulmonary embolism and DVTs Continue apixaban  5   Signed, Eulas FORBES Furbish, MD  04/20/2024 3:21 PM    [facility]

## 2024-04-20 NOTE — Addendum Note (Signed)
 Addended by: PHILIS RAISIN R on: 04/20/2024 03:47 PM   Modules accepted: Orders

## 2024-04-20 NOTE — Patient Instructions (Addendum)
 Medication Instructions:  Your physician recommends that you continue on your current medications as directed. Please refer to the Current Medication list given to you today. *If you need a refill on your cardiac medications before your next appointment, please call your pharmacy*  Lab Work: CBC and BMET Today - please have pre-procedure lab work completed at [facility] on the first floor If you have labs (blood work) drawn today and your tests are completely normal, you will receive your results only by: MyChart Message (if you have MyChart) OR A paper copy in the mail If you have any lab test that is abnormal or we need to change your treatment, we will call you to review the results.  Testing/Procedures: BiV ICD implant - see instruction letter - scheduled for Friday, July 25 Your physician has recommended that you have a defibrillator inserted. An implantable cardioverter defibrillator (ICD) is a small device that is placed in your chest or, in rare cases, your abdomen. This device uses electrical pulses or shocks to help control life-threatening, irregular heartbeats that could lead the heart to suddenly stop beating (sudden cardiac arrest). Leads are attached to the ICD that goes into your heart. This is done in the hospital and usually requires an overnight stay. Please see the instruction sheet given to you today for more information.   Follow-Up: At [facility], you and your health needs are our priority.  As part of our continuing mission to provide you with exceptional heart care, our providers are all part of one team.  This team includes your primary Cardiologist (physician) and Advanced Practice Providers or APPs (Physician Assistants and Nurse Practitioners) who all work together to provide you with the care you need, when you need it.  Your next appointment:   We will schedule follow up after your ICD implant   Provider:   Eulas Furbish, MD

## 2024-04-20 NOTE — Progress Notes (Signed)
 Electrophysiology Office Note:    Date:  04/20/2024   ID:  Niccole, Witthuhn [birth year], MRN [id number]  PCP:  [facility]   [facility] Providers Cardiologist:  Diannah SHAUNNA Maywood, MD Electrophysiologist:  Eulas FORBES Furbish, MD     Referring MD: [facility]*   History of Present Illness:    Summer Christensen is a 73 y.o. female with a medical history significant for hypertension, nonischemic cardiomyopathy with EF 20 to 25%, pulmonary embolism referred to discuss ICD placement.     She was diagnosed with heart failure in October 2023 when admitted for septic shock secondary to pneumonia.  Echocardiogram showed an EF of 20 to 25%.  This was complicated by a right lower extremity DVT and left upper lobe pulmonary embolism.  Eliquis  was started and she has been following up in general cardiology clinic with Dr. Adelfa Gaunt and heart failure clinic with Dr. Gardenia.     Today, she reports that she continues to be fatigued and have very low energy.  EKGs/Labs/Other Studies Reviewed Today:    Echocardiogram:   TTE March 29, 2024 Ejection fraction 25 to 30%.  Grade 1 diastolic dysfunction.  TTE Feb 20, 2023 Ejection fraction 20 to 25% with dyssynchrony due to left bundle branch block  August 11, 2022 Ejection fraction 20 to 25%  TTE July 29, 2022 EF less than 20%.  Grade 3 diastolic dysfunction mild to moderate mitral regurgitation.   Monitors:   Stress testing:   Advanced imaging:  Cardiac MRI November 2024 LVEF 26%.   Coronary CTA September 23, 2022 1. Coronary calcium  score of 53.8. This was 67 percentile for age-, sex, and race-matched controls.  2. Normal coronary origin with right dominance.  3. Mild (25-49) calcified plaque in the ostial LAD.  4. Aortic atherosclerosis.  5. Left ventricular enlargement.  Cardiac catherization    EKG:   EKG Interpretation Date/Time:  Wednesday April 20 2024 14:51:12  EDT Ventricular Rate:  83 PR Interval:  192 QRS Duration:  152 QT Interval:  436 QTC Calculation: 512 R Axis:   133  Text Interpretation: Normal sinus rhythm Right axis deviation Left ventricular hypertrophy with QRS widening and repolarization abnormality ( Cornell product ) When compared with ECG of 28-Mar-2024 11:22, Right axis deviation is now present Confirmed by Furbish Eulas [id number]) on 04/20/2024 3:20:02 PM     Physical Exam:    VS:  BP (!) 120/56 (BP Location: Left Arm, Patient Position: Sitting, Cuff Size: Normal)   Pulse 83   Ht 5' 4 (1.626 m)   Wt 142 lb (64.4 kg)   SpO2 98%   BMI 24.37 kg/m     Wt Readings from Last 3 Encounters:  04/20/24 142 lb (64.4 kg)  04/04/24 143 lb 4.8 oz (65 kg)  03/28/24 142 lb 3.2 oz (64.5 kg)     GEN:  Well nourished, well developed in no acute distress CARDIAC: RRR, no murmurs, rubs, gallops RESPIRATORY:  Normal work of breathing MUSCULOSKELETAL: no edema    ASSESSMENT & PLAN:    CHFrEF, nonischemic cardiomyopathy Possibly left bundle branch block induced EF remains <30% despite GDMT With depressed EF and true left on a ranch block, I think it appropriate to place a CRT device  Continue carvedilol  12.5, digoxin  0.125, Entresto  97-103, spironolactone  25 We discussed the indication and rationale for CRT-D placement.  At this time, she would like to proceed  I discussed the indication for the  procedure and the logistics, risks, potential benefit, and after care. I specifically explained that risks include but are not limited to infection, bleeding,damage to blood vessels, lung, and the heart -- but risk of prolonged hospitalization, need for surgery, or the event of stroke, heart attack, or death are low but not zero.    LBBB Planning for CRT device  History of pulmonary embolism and DVTs Continue apixaban  5   Signed, Eulas FORBES Furbish, MD  04/20/2024 3:21 PM    [facility]

## 2024-04-21 ENCOUNTER — Ambulatory Visit: Payer: Self-pay | Admitting: *Deleted

## 2024-04-21 LAB — BASIC METABOLIC PANEL WITH GFR
BUN/Creatinine Ratio: 16 (ref 12–28)
BUN: 17 mg/dL (ref 8–27)
CO2: 23 mmol/L (ref 20–29)
Calcium: 9.8 mg/dL (ref 8.7–10.3)
Chloride: 107 mmol/L — ABNORMAL HIGH (ref 96–106)
Creatinine, Ser: 1.08 mg/dL — ABNORMAL HIGH (ref 0.57–1.00)
Glucose: 122 mg/dL — ABNORMAL HIGH (ref 70–99)
Potassium: 3.8 mmol/L (ref 3.5–5.2)
Sodium: 145 mmol/L — ABNORMAL HIGH (ref 134–144)
eGFR: 55 mL/min/{1.73_m2} — ABNORMAL LOW (ref >59)

## 2024-04-28 ENCOUNTER — Encounter: Payer: Self-pay | Admitting: Emergency Medicine

## 2024-05-06 ENCOUNTER — Telehealth (HOSPITAL_COMMUNITY): Payer: Self-pay

## 2024-05-06 NOTE — Telephone Encounter (Signed)
 Attempted to reach patient to discuss upcoming procedure, no answer. Left VM for patient to return call.

## 2024-05-10 ENCOUNTER — Other Ambulatory Visit (HOSPITAL_COMMUNITY)
Admission: RE | Admit: 2024-05-10 | Discharge: 2024-05-10 | Disposition: A | Discharge status: Home / Self Care | Source: Ambulatory Visit | Attending: Cardiovascular Disease | Admitting: Cardiovascular Disease

## 2024-05-10 ENCOUNTER — Ambulatory Visit: Payer: Self-pay | Admitting: Cardiovascular Disease

## 2024-05-10 DIAGNOSIS — I447 Left bundle-branch block, unspecified: Secondary | ICD-10-CM | POA: Insufficient documentation

## 2024-05-10 DIAGNOSIS — I5022 Chronic systolic (congestive) heart failure: Secondary | ICD-10-CM | POA: Diagnosis not present

## 2024-05-10 DIAGNOSIS — I428 Other cardiomyopathies: Secondary | ICD-10-CM | POA: Insufficient documentation

## 2024-05-10 LAB — CBC
HCT: 35.7 % — ABNORMAL LOW (ref 36.0–46.0)
Hemoglobin: 12.1 g/dL (ref 12.0–15.0)
MCH: 29.3 pg (ref 26.0–34.0)
MCHC: 33.9 g/dL (ref 30.0–36.0)
MCV: 86.4 fL (ref 80.0–100.0)
Platelets: 236 K/uL (ref 150–400)
RBC: 4.13 MIL/uL (ref 3.87–5.11)
RDW: 12.6 % (ref 11.5–15.5)
WBC: 10.3 K/uL (ref 4.0–10.5)
nRBC: 0 % (ref 0.0–0.2)

## 2024-05-10 NOTE — Telephone Encounter (Signed)
 Attempted to reach patient to discuss upcoming procedure, no answer. Left VM for patient to return call.

## 2024-05-11 ENCOUNTER — Telehealth (HOSPITAL_COMMUNITY): Payer: Self-pay | Admitting: Cardiology

## 2024-05-12 NOTE — Pre-Procedure Instructions (Signed)
 Attempted to call patient regarding procedure instructions.  Left voicemail on the following items: Arrival time 0930 Nothing to eat or drink after midnight No meds AM of procedure Responsible person to drive you home and stay with you for 24 hrs Wash with special soap night before and morning of procedure If on anti-coagulant drug instructions Eliquis - last dose 7/22

## 2024-05-13 ENCOUNTER — Other Ambulatory Visit: Payer: Self-pay

## 2024-05-13 ENCOUNTER — Encounter (HOSPITAL_COMMUNITY)
Admission: RE | Disposition: A | Discharge status: Home / Self Care | Payer: Self-pay | Source: Home / Self Care | Attending: Cardiovascular Disease

## 2024-05-13 ENCOUNTER — Ambulatory Visit (HOSPITAL_COMMUNITY)

## 2024-05-13 ENCOUNTER — Ambulatory Visit (HOSPITAL_COMMUNITY)
Admission: RE | Admit: 2024-05-13 | Discharge: 2024-05-13 | Disposition: A | Discharge status: Home / Self Care | Attending: Cardiovascular Disease | Admitting: Cardiovascular Disease

## 2024-05-13 DIAGNOSIS — I428 Other cardiomyopathies: Secondary | ICD-10-CM

## 2024-05-13 DIAGNOSIS — Z79899 Other long term (current) drug therapy: Secondary | ICD-10-CM | POA: Insufficient documentation

## 2024-05-13 DIAGNOSIS — Z86711 Personal history of pulmonary embolism: Secondary | ICD-10-CM | POA: Diagnosis not present

## 2024-05-13 DIAGNOSIS — I447 Left bundle-branch block, unspecified: Secondary | ICD-10-CM | POA: Diagnosis not present

## 2024-05-13 DIAGNOSIS — I429 Cardiomyopathy, unspecified: Secondary | ICD-10-CM

## 2024-05-13 DIAGNOSIS — Z7901 Long term (current) use of anticoagulants: Secondary | ICD-10-CM | POA: Insufficient documentation

## 2024-05-13 DIAGNOSIS — I5022 Chronic systolic (congestive) heart failure: Secondary | ICD-10-CM | POA: Diagnosis not present

## 2024-05-13 DIAGNOSIS — I110 Hypertensive heart disease with heart failure: Secondary | ICD-10-CM | POA: Insufficient documentation

## 2024-05-13 HISTORY — PX: LEAD INSERTION: EP1212

## 2024-05-13 HISTORY — PX: BIV ICD INSERTION CRT-D: EP1195

## 2024-05-13 LAB — GLUCOSE, CAPILLARY: Glucose-Capillary: 149 mg/dL — ABNORMAL HIGH (ref 70–99)

## 2024-05-13 SURGERY — BIV ICD INSERTION CRT-D

## 2024-05-13 MED ORDER — APIXABAN 5 MG PO TABS: 5.0000 mg | ORAL_TABLET | Freq: Two times a day (BID) | ORAL | Status: AC

## 2024-05-13 MED ORDER — FENTANYL CITRATE (PF) 100 MCG/2ML IJ SOLN
INTRAMUSCULAR | Status: DC | PRN
  Administered 2024-05-13 (×4): 25 ug via INTRAVENOUS

## 2024-05-13 MED ORDER — HEPARIN (PORCINE) IN NACL 1000-0.9 UT/500ML-% IV SOLN
INTRAVENOUS | Status: DC | PRN
  Administered 2024-05-13: 500 mL

## 2024-05-13 MED ORDER — IOHEXOL 350 MG/ML SOLN
INTRAVENOUS | Status: DC | PRN
Start: 2024-05-13 — End: 2024-05-14
  Administered 2024-05-13: 15 mL

## 2024-05-13 MED ORDER — ACETAMINOPHEN 325 MG PO TABS
325.0000 mg | ORAL_TABLET | ORAL | Status: DC | PRN
  Administered 2024-05-13: 650 mg via ORAL
  Filled 2024-05-13: qty 2

## 2024-05-13 MED ORDER — ONDANSETRON HCL 4 MG/2ML IJ SOLN: 4.0000 mg | Freq: Four times a day (QID) | INTRAMUSCULAR | Status: DC | PRN

## 2024-05-13 MED ORDER — CEFAZOLIN SODIUM-DEXTROSE 2-4 GM/100ML-% IV SOLN: 2.0000 g | INTRAVENOUS | Status: AC

## 2024-05-13 MED ORDER — SODIUM CHLORIDE 0.9 % IV SOLN: 80.0000 mg | INTRAVENOUS | Status: AC

## 2024-05-13 MED ORDER — MIDAZOLAM HCL 5 MG/5ML IJ SOLN
INTRAMUSCULAR | Status: DC | PRN
  Administered 2024-05-13 (×4): 1 mg via INTRAVENOUS

## 2024-05-13 MED ORDER — SODIUM CHLORIDE 0.9 % IV SOLN
INTRAVENOUS | Status: AC
  Administered 2024-05-13: 80 mg
  Filled 2024-05-13: qty 2

## 2024-05-13 MED ORDER — SODIUM CHLORIDE 0.9 % IV SOLN: INTRAVENOUS | Status: DC

## 2024-05-13 MED ORDER — LIDOCAINE HCL 1 % IJ SOLN
INTRAMUSCULAR | Status: AC
  Filled 2024-05-13: qty 60

## 2024-05-13 MED ORDER — MIDAZOLAM HCL 5 MG/5ML IJ SOLN
INTRAMUSCULAR | Status: AC
  Filled 2024-05-13: qty 5

## 2024-05-13 MED ORDER — LIDOCAINE HCL (PF) 1 % IJ SOLN
INTRAMUSCULAR | Status: DC | PRN
  Administered 2024-05-13: 60 mL

## 2024-05-13 MED ORDER — FENTANYL CITRATE (PF) 100 MCG/2ML IJ SOLN
INTRAMUSCULAR | Status: AC
  Filled 2024-05-13: qty 2

## 2024-05-13 MED ORDER — CEFAZOLIN SODIUM-DEXTROSE 2-4 GM/100ML-% IV SOLN
INTRAVENOUS | Status: AC
  Administered 2024-05-13: 2 g via INTRAVENOUS
  Filled 2024-05-13: qty 100

## 2024-05-13 MED ORDER — POVIDONE-IODINE 10 % EX SWAB
2.0000 | Freq: Once | CUTANEOUS | Status: AC
  Administered 2024-05-13: 2 via TOPICAL

## 2024-05-13 SURGICAL SUPPLY — 17 items
BALLOON COR SINUS VENO 6FR 80 (BALLOONS) IMPLANT
CABLE SURGICAL S-101-97-12 (CABLE) ×1 IMPLANT
CATH CPS DIRECT WD DS2C028 (CATHETERS) IMPLANT
ICD GALLANT HF CRT-D DF-4 IS-1 (ICD Generator) IMPLANT
KIT MICROPUNCTURE NIT STIFF (SHEATH) IMPLANT
LEAD DURATA 7122Q-58CM (Lead) IMPLANT
LEAD ULTIPACE 52 LPA1231/52 (Lead) IMPLANT
LEAD ULTIPACE 65 LPA1231/65 (Lead) IMPLANT
PAD DEFIB RADIO PHYSIO CONN (PAD) ×1 IMPLANT
SHEATH 7FR PRELUDE SNAP 13 (SHEATH) IMPLANT
SHEATH 9.5FR PRELUDE SNAP 13 (SHEATH) IMPLANT
SHEATH 9FR PRELUDE SNAP 25 (SHEATH) IMPLANT
SHEATH PROBE COVER 6X72 (BAG) IMPLANT
SLITTER AGILIS HISPRO (INSTRUMENTS) IMPLANT
TOOL HELIX LOCKING (MISCELLANEOUS) IMPLANT
TRAY PACEMAKER INSERTION (PACKS) ×1 IMPLANT
WIRE HI TORQ VERSACORE-J 145CM (WIRE) IMPLANT

## 2024-05-13 NOTE — Discharge Instructions (Addendum)
 After Your ICD (Implantable Cardiac Defibrillator)   You have a Abbott ICD  ACTIVITY Do not lift your arm above shoulder height for 1 week after your procedure. After 7 days, you may progress as below.  You should remove your sling 24 hours after your procedure, unless otherwise instructed by your provider.     Friday May 20, 2024  Saturday May 21, 2024 Sunday May 22, 2024 Monday May 23, 2024   Do not lift, push, pull, or carry anything over 10 pounds with the affected arm until 6 weeks (Friday June 24, 2024 ) after your procedure.   You may drive AFTER your wound check, unless you have been told otherwise by your provider.   Ask your healthcare provider when you can go back to work   INCISION/Dressing If you are on a blood thinner such as Coumadin, Xarelto, Eliquis , Plavix, or Pradaxa please confirm with your provider when this should be resumed.   If large square, outer bandage is left in place, this can be removed after 24 hours from your procedure. Do not remove steri-strips or glue as below.   Monitor your defibrillator site for redness, swelling, and drainage. Call the device clinic at [phone] if you experience these symptoms or fever/chills.  If your incision is sealed with Steri-strips or staples, you may shower 7 days after your procedure or when told by your provider. Do not remove the steri-strips or let the shower hit directly on your site. You may wash around your site with soap and water.    If you were discharged in a sling, please do not wear this during the day more than 48 hours after your surgery unless otherwise instructed. This may increase the risk of stiffness and soreness in your shoulder.   Avoid lotions, ointments, or perfumes over your incision until it is well-healed.  You may use a hot tub or a pool AFTER your wound check appointment if the incision is completely closed.  Your ICD is designed to protect you from life threatening heart  rhythms. Because of this, you may receive a shock.   1 shock with no symptoms:  Call the office during business hours. 1 shock with symptoms (chest pain, chest pressure, dizziness, lightheadedness, shortness of breath, overall feeling unwell):  Call 911. If you experience 2 or more shocks in 24 hours:  Call 911. If you receive a shock, you should not drive for 6 months per the [place] DMV IF you receive appropriate therapy from your ICD.   ICD Alerts:  Some alerts are vibratory and others beep. These are NOT emergencies. Please call our office to let us  know. If this occurs at night or on weekends, it can wait until the next business day. Send a remote transmission.  If your device is capable of reading fluid status (for heart failure), you will be offered monthly monitoring to review this with you.   DEVICE MANAGEMENT Remote monitoring is used to monitor your ICD from home. This monitoring is scheduled every 91 days by our office. It allows us  to keep an eye on the functioning of your device to ensure it is working properly. You will routinely see your Electrophysiologist annually (more often if necessary).   You should receive your ID card for your new device in 4-8 weeks. Keep this card with you at all times once received. Consider wearing a medical alert bracelet or necklace.  Your ICD  may be MRI compatible. This will be discussed at your next  office visit/wound check.  You should avoid contact with strong electric or magnetic fields.   Do not use amateur (ham) radio equipment or electric (arc) welding torches. MP3 player headphones with magnets should not be used. Some devices are safe to use if held at least 12 inches (30 cm) from your defibrillator. These include power tools, lawn mowers, and speakers. If you are unsure if something is safe to use, ask your health care provider.  When using your cell phone, hold it to the ear that is on the opposite side from the defibrillator. Do not leave  your cell phone in a pocket over the defibrillator.  You may safely use electric blankets, heating pads, computers, and microwave ovens.  Call the office right away if: You have chest pain. You feel more than one shock. You feel more short of breath than you have felt before. You feel more light-headed than you have felt before. Your incision starts to open up.  This information is not intended to replace advice given to you by your health care provider. Make sure you discuss any questions you have with your health care provider.

## 2024-05-13 NOTE — Interval H&P Note (Signed)
 History and Physical Interval Note:  05/13/2024 12:07 PM  Chandy C Herford  has presented today for surgery, with the diagnosis of cardiomyopathy.  The various methods of treatment have been discussed with the patient and family. After consideration of risks, benefits and other options for treatment, the patient has consented to  Procedure(s): BIV ICD INSERTION CRT-D (N/A) LEAD INSERTION (N/A) as a surgical intervention.  The patient's history has been reviewed, patient examined, no change in status, stable for surgery.  I have reviewed the patient's chart and labs.  Questions were answered to the patient's satisfaction.     Aston Lieske E Tyanna Hach

## 2024-05-15 ENCOUNTER — Encounter (HOSPITAL_COMMUNITY): Payer: Self-pay | Admitting: Cardiovascular Disease

## 2024-05-17 ENCOUNTER — Emergency Department (HOSPITAL_COMMUNITY)

## 2024-05-17 ENCOUNTER — Encounter (HOSPITAL_COMMUNITY): Payer: Self-pay | Admitting: Emergency Medicine

## 2024-05-17 ENCOUNTER — Other Ambulatory Visit: Payer: Self-pay

## 2024-05-17 ENCOUNTER — Observation Stay (HOSPITAL_COMMUNITY)
Admission: EM | Admit: 2024-05-17 | Discharge: 2024-05-20 | Disposition: A | Discharge status: Home / Self Care | Attending: Internal Medicine | Admitting: Internal Medicine

## 2024-05-17 DIAGNOSIS — Z95810 Presence of automatic (implantable) cardiac defibrillator: Secondary | ICD-10-CM | POA: Insufficient documentation

## 2024-05-17 DIAGNOSIS — I10 Essential (primary) hypertension: Secondary | ICD-10-CM | POA: Insufficient documentation

## 2024-05-17 DIAGNOSIS — Z7901 Long term (current) use of anticoagulants: Secondary | ICD-10-CM | POA: Diagnosis not present

## 2024-05-17 DIAGNOSIS — Z86718 Personal history of other venous thrombosis and embolism: Secondary | ICD-10-CM | POA: Insufficient documentation

## 2024-05-17 DIAGNOSIS — Z79899 Other long term (current) drug therapy: Secondary | ICD-10-CM | POA: Diagnosis not present

## 2024-05-17 DIAGNOSIS — R55 Syncope and collapse: Secondary | ICD-10-CM | POA: Diagnosis not present

## 2024-05-17 DIAGNOSIS — D72829 Elevated white blood cell count, unspecified: Secondary | ICD-10-CM | POA: Diagnosis not present

## 2024-05-17 DIAGNOSIS — R270 Ataxia, unspecified: Secondary | ICD-10-CM

## 2024-05-17 DIAGNOSIS — I5023 Acute on chronic systolic (congestive) heart failure: Secondary | ICD-10-CM | POA: Insufficient documentation

## 2024-05-17 DIAGNOSIS — I5020 Unspecified systolic (congestive) heart failure: Secondary | ICD-10-CM | POA: Insufficient documentation

## 2024-05-17 DIAGNOSIS — I428 Other cardiomyopathies: Secondary | ICD-10-CM | POA: Insufficient documentation

## 2024-05-17 DIAGNOSIS — I110 Hypertensive heart disease with heart failure: Secondary | ICD-10-CM | POA: Diagnosis not present

## 2024-05-17 DIAGNOSIS — R748 Abnormal levels of other serum enzymes: Secondary | ICD-10-CM | POA: Insufficient documentation

## 2024-05-17 DIAGNOSIS — R42 Dizziness and giddiness: Principal | ICD-10-CM | POA: Insufficient documentation

## 2024-05-17 DIAGNOSIS — Z86711 Personal history of pulmonary embolism: Secondary | ICD-10-CM | POA: Insufficient documentation

## 2024-05-17 DIAGNOSIS — R531 Weakness: Secondary | ICD-10-CM | POA: Insufficient documentation

## 2024-05-17 LAB — DIGOXIN LEVEL: Digoxin Level: <0.4 ng/mL — ABNORMAL LOW (ref 0.8–2.0)

## 2024-05-17 LAB — COMPREHENSIVE METABOLIC PANEL WITH GFR
ALT: 19 U/L (ref 0–44)
AST: 20 U/L (ref 15–41)
Albumin: 3.5 g/dL (ref 3.5–5.0)
Alkaline Phosphatase: 56 U/L (ref 38–126)
Anion gap: 9 (ref 5–15)
BUN: 18 mg/dL (ref 8–23)
CO2: 22 mmol/L (ref 22–32)
Calcium: 9.3 mg/dL (ref 8.9–10.3)
Chloride: 106 mmol/L (ref 98–111)
Creatinine, Ser: 1.06 mg/dL — ABNORMAL HIGH (ref 0.44–1.00)
GFR, Estimated: 56 mL/min — ABNORMAL LOW (ref >60)
Glucose, Bld: 193 mg/dL — ABNORMAL HIGH (ref 70–99)
Potassium: 4.1 mmol/L (ref 3.5–5.1)
Sodium: 137 mmol/L (ref 135–145)
Total Bilirubin: 0.4 mg/dL (ref 0.0–1.2)
Total Protein: 6.5 g/dL (ref 6.5–8.1)

## 2024-05-17 LAB — CBC
HCT: 33.3 % — ABNORMAL LOW (ref 36.0–46.0)
Hemoglobin: 11.1 g/dL — ABNORMAL LOW (ref 12.0–15.0)
MCH: 28.4 pg (ref 26.0–34.0)
MCHC: 33.3 g/dL (ref 30.0–36.0)
MCV: 85.2 fL (ref 80.0–100.0)
Platelets: 175 K/uL (ref 150–400)
RBC: 3.91 MIL/uL (ref 3.87–5.11)
RDW: 12.4 % (ref 11.5–15.5)
WBC: 14.5 K/uL — ABNORMAL HIGH (ref 4.0–10.5)
nRBC: 0 % (ref 0.0–0.2)

## 2024-05-17 MED ORDER — ONDANSETRON HCL 4 MG/2ML IJ SOLN: 4.0000 mg | Freq: Four times a day (QID) | INTRAMUSCULAR | Status: DC | PRN

## 2024-05-17 MED ORDER — ACETAMINOPHEN 325 MG PO TABS
650.0000 mg | ORAL_TABLET | Freq: Four times a day (QID) | ORAL | Status: DC | PRN
  Administered 2024-05-18 (×2): 650 mg via ORAL
  Filled 2024-05-17 (×2): qty 2

## 2024-05-17 MED ORDER — ACETAMINOPHEN 650 MG RE SUPP: 650.0000 mg | Freq: Four times a day (QID) | RECTAL | Status: DC | PRN

## 2024-05-17 MED ORDER — MELATONIN 3 MG PO TABS: 3.0000 mg | ORAL_TABLET | Freq: Every evening | ORAL | Status: DC | PRN

## 2024-05-17 NOTE — Consult Note (Signed)
 Cardiology Consultation   Patient ID: Summer Christensen MRN: [id number]; DOB: 19-Feb-1951  Admit date: 05/17/2024 Date of Consult: 05/17/2024  PCP:  [facility]   [facility] Providers Cardiologist:  Vishnu P Mallipeddi, MD  Electrophysiologist:  Eulas FORBES Furbish, MD    Patient Profile: Summer Christensen is a 73 y.o. female with a hx of HTN, LBBB, NICM with LVEF of 25-30% s/p CRT-ICD with LBBAP on 7/25, PE who is being seen 05/17/2024 for the evaluation of lightheadedness.  History of Present Illness: [name] reports that she was in her normal self state until this morning when she woke up not feeling well. A few hours into the day, she started to have lightheadedness that was not necessarily positional. Lightheadedness lasted for a few hours until her presentation to the ED. She however continued to feel poorly with no specific symptoms. No fever, chills, rigors, chest pain, SOB, PND, orthopnea, leg swelling, palpitations, or syncope. Pain at the site of the ICD placement has improved since discharge with no redness or discharge noted by the patient.   In the ED, she was afebrile. BP 155-161/74-85. EKG with A-sensed, V-paced rhythm. Labs were remarkable for WBC 14.5 (compared to 10.3 ~7 days ago). CTH with no acute processes.    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   Chest pain    CHF (congestive heart failure) (HCC)    Hx of hysterectomy    Hypertension    Migraine     Past Surgical History:  Procedure Laterality Date   ABDOMINAL HYSTERECTOMY     BIV ICD INSERTION CRT-D N/A 05/13/2024   Procedure: BIV ICD INSERTION CRT-D;  Surgeon: Furbish Eulas FORBES, MD;  Location: [facility] INVASIVE CV LAB;  Service: Cardiovascular;  Laterality: N/A;   COLONOSCOPY N/A 06/11/2016   Procedure: COLONOSCOPY;  Surgeon: Claudis RAYMOND Rivet, MD;  Location: [facility] ENDO SUITE;  Service: Endoscopy;  Laterality: N/A;  2:00 - moved to 1:00 - Ann  notified pt   LAPAROSCOPIC CHOLECYSTECTOMY  2005   pain.   LEAD INSERTION N/A 05/13/2024   Procedure: LEAD INSERTION;  Surgeon: Furbish Eulas FORBES, MD;  Location: [facility] INVASIVE CV LAB;  Service: Cardiovascular;  Laterality: N/A;   ROTATOR CUFF REPAIR  2009   arthroscopic   TUBAL LIGATION     bilateral     Home Medications:  Prior to Admission medications   Medication Sig Start Date End Date Taking? Authorizing Provider  apixaban  (ELIQUIS ) 5 MG TABS tablet Take 1 tablet (5 mg total) by mouth 2 (two) times daily. 05/19/24   Mealor, Augustus E, MD  carvedilol  (COREG ) 12.5 MG tablet TAKE ONE TABLET BY MOUTH TWICE DAILY 10/07/23   Mallipeddi, Vishnu P, MD  digoxin  (LANOXIN ) 0.125 MG tablet Take 1 tablet (0.125 mg total) by mouth daily. 05/04/23   Sabharwal, Aditya, DO  ferrous sulfate 325 (65 FE) MG tablet Take 325 mg by mouth daily with breakfast.    [provider]  furosemide  (LASIX ) 40 MG tablet TAKE 40MG  DAILY, MAY TAKE ADDITIONAL DOSE OF LASIX  40MG  AS NEEDED FOR SWELLING Patient taking differently: Take 40 mg by mouth in the morning and at bedtime. 05/29/23   Sabharwal, Ria, DO  Multiple Vitamin (MULTIVITAMIN WITH MINERALS) TABS tablet Take 1 tablet by mouth 4 (four) times a week. Women's Alive 50+    [provider]  sacubitril -valsartan  (ENTRESTO ) 97-103 MG Take 1 tablet by mouth 2 (two) times daily.  05/04/23   Sabharwal, Aditya, DO  sitaGLIPtin (JANUVIA) 50 MG tablet Take 50 mg by mouth in the morning.    [provider]    Scheduled Meds:  Continuous Infusions:  PRN Meds:   Allergies:    Allergies  Allergen Reactions   Jardiance  [Empagliflozin ] Itching and Swelling   Metformin  Dermatitis   Metformin  And Related Itching and Rash    Social History:   Social History   Socioeconomic History   Marital status: Divorced    Spouse name: Not on file   Number of children: 3   Years of education: Not on file   Highest education level: Not on file   Occupational History   Occupation: post office clerk    Employer: [employer]  Tobacco Use   Smoking status: Never    Passive exposure: Never   Smokeless tobacco: Never  Vaping Use   Vaping status: Never Used  Substance and Sexual Activity   Alcohol use: No   Drug use: No   Sexual activity: Never    Birth control/protection: Surgical    Comment: hyst  Other Topics Concern   Not on file  Social History Narrative   Not on file   Social Drivers of Health   Financial Resource Strain: Not on file  Food Insecurity: No Food Insecurity (05/13/2023)   Hunger Vital Sign    Worried About Running Out of Food in the Last Year: Never true    Ran Out of Food in the Last Year: Never true  Transportation Needs: No Transportation Needs (05/13/2023)   PRAPARE - Administrator, Civil Service (Medical): No    Lack of Transportation (Non-Medical): No  Physical Activity: Not on file  Stress: Not on file  Social Connections: Not on file  Intimate Partner Violence: Not At Risk (05/13/2023)   Humiliation, Afraid, Rape, and Kick questionnaire    Fear of Current or Ex-Partner: No    Emotionally Abused: No    Physically Abused: No    Sexually Abused: No    Family History:    Family History  Problem Relation Age of Onset   Stroke Mother    Heart attack Father      ROS:  Please see the history of present illness.   All other ROS reviewed and negative.     Physical Exam/Data: Vitals:   05/17/24 2125 05/17/24 2130 05/17/24 2245 05/17/24 2300  BP:  (!) 159/77 (!) 155/74 (!) 161/85  Pulse:  72 74 77  Resp:  17 19 19   Temp:      TempSrc:      SpO2:  98% 97% 99%  Weight: 65.3 kg     Height: 5' 4 (1.626 m)      No intake or output data in the 24 hours ending 05/17/24 2327    05/17/2024    9:25 PM 05/13/2024    9:59 AM 04/20/2024    2:47 PM  Last 3 Weights  Weight (lbs) 144 lb 144 lb 142 lb  Weight (kg) 65.318 kg 65.318 kg 64.411 kg     Body mass index is 24.72  kg/m.  General:  Well nourished, well developed, in no acute distress HEENT: normal Neck: no JVD Vascular: Distal pulses 2+ bilaterally Cardiac:  normal S1, S2; RRR; no murmur  Lungs:  clear to auscultation bilaterally, no wheezing, rhonchi or rales  Abd: soft, nontender, no hepatomegaly  Ext: no edema Musculoskeletal:  No deformities, BUE and BLE strength normal and equal  Skin: warm and dry. ICD placement site with no tenderness, surrounding erythema, and with no discharge  Neuro:  CNs 2-12 intact, no focal abnormalities noted Psych:  Normal affect   EKG:  The EKG was personally reviewed and demonstrates:  A-sensed, V-paced rhythm   Relevant CV Studies:  TTE 03/29/2024:   1. Left ventricular ejection fraction, by estimation, is 25 to 30%. Left  ventricular ejection fraction by 3D volume is 37 %. The left ventricle has  severely decreased function. The left ventricle demonstrates global  hypokinesis. The left ventricular  internal cavity size was moderately dilated. Left ventricular diastolic  parameters are consistent with Grade I diastolic dysfunction (impaired  relaxation). Elevated left atrial pressure. The average left ventricular  global longitudinal strain is -11.0 %.   The global longitudinal strain is abnormal.   2. Right ventricular systolic function is normal. The right ventricular  size is normal. There is normal pulmonary artery systolic pressure.   3. The mitral valve is normal in structure. Mild mitral valve  regurgitation. No evidence of mitral stenosis.   4. The aortic valve is tricuspid. Aortic valve regurgitation is not  visualized. No aortic stenosis is present.   5. The inferior vena cava is normal in size with greater than 50%  respiratory variability, suggesting right atrial pressure of 3 mmHg.   Laboratory Data: High Sensitivity Troponin:  No results for input(s): TROPONINIHS in the last 720 hours.   Chemistry Recent Labs  Lab 05/17/24 2129  NA  137  K 4.1  CL 106  CO2 22  GLUCOSE 193*  BUN 18  CREATININE 1.06*  CALCIUM  9.3  GFRNONAA 56*  ANIONGAP 9    Recent Labs  Lab 05/17/24 2129  PROT 6.5  ALBUMIN 3.5  AST 20  ALT 19  ALKPHOS 56  BILITOT 0.4   Lipids No results for input(s): CHOL, TRIG, HDL, LABVLDL, LDLCALC, CHOLHDL in the last 168 hours.  Hematology Recent Labs  Lab 05/17/24 2129  WBC 14.5*  RBC 3.91  HGB 11.1*  HCT 33.3*  MCV 85.2  MCH 28.4  MCHC 33.3  RDW 12.4  PLT 175   Thyroid  No results for input(s): TSH, FREET4 in the last 168 hours.  BNPNo results for input(s): BNP, PROBNP in the last 168 hours.  DDimer No results for input(s): DDIMER in the last 168 hours.  Radiology/Studies:  CT HEAD WO CONTRAST Result Date: 05/17/2024 CLINICAL DATA:  Headaches EXAM: CT HEAD WITHOUT CONTRAST TECHNIQUE: Contiguous axial images were obtained from the base of the skull through the vertex without intravenous contrast. RADIATION DOSE REDUCTION: This exam was performed according to the departmental dose-optimization program which includes automated exposure control, adjustment of the mA and/or kV according to patient size and/or use of iterative reconstruction technique. COMPARISON:  None Available. FINDINGS: Brain: No evidence of acute infarction, hemorrhage, hydrocephalus, extra-axial collection or mass lesion/mass effect. Vascular: No hyperdense vessel or unexpected calcification. Skull: Normal. Negative for fracture or focal lesion. Sinuses/Orbits: No acute finding. Other: None. IMPRESSION: No acute intracranial abnormality noted. Electronically Signed   By: Oneil Devonshire M.D.   On: 05/17/2024 22:52   DG Chest Port 1 View Result Date: 05/17/2024 CLINICAL DATA:  Syncopal episode EXAM: PORTABLE CHEST 1 VIEW COMPARISON:  05/13/2024 FINDINGS: Cardiac shadow is stable. Defibrillator is again noted. Lungs are well aerated bilaterally infiltrate or sizable effusion is seen. Some minimal scarring in the  left base is noted. IMPRESSION: No active disease. Electronically Signed   By: Oneil Devonshire  M.D.   On: 05/17/2024 22:39     Assessment and Plan: NICM s/p CRT-ICD with LBBAP on 7/25 Leukocytosis  Her presentation with non-specific symptoms (generalized weakness and intermittent lightheadedness) in the setting of leukocytosis and recent ICD in situ warrants further work-up to rule out infectious etiology. That being said, physical examination of the generator site shows benign findings with no features of infection, which makes an infectious etiology less likely, although would not rule it out. She's euvolemic with no signs of poor perfusion.  - Obtain blood cultures  - Obtain CRP and procalcitonin  - Would hold off on antibiotics for now given overall low concern for infection - Will follow up on device interrogation  - Obtain BNP - Recommend medicine admission for further work-up  For questions or updates, please contact [facility] Please consult [email or web address] for contact info under    Signed, Gillian CHRISTELLA Cass, MD  05/17/2024 11:27 PM

## 2024-05-17 NOTE — ED Triage Notes (Signed)
 Pt BIB [facility] from home. Pt had ICD placed on 7/25. Yesterday started feeling weak, got increasingly worse today. At 1400 today pt had sudden onset of headache and SOB when moving.    160/80, HR 80  4mg  Zofran  given in route

## 2024-05-17 NOTE — Progress Notes (Addendum)
  Carryover admission to the Day Admitter.  I discussed this case with the EDP, Dr. Charlyn.  Per these discussions:   This is a 73 year old female with history of chronic systolic heart failure status post recent ICD placement on 05/13/2024, pulmonary embolism on Eliquis , who is being admitted with recurrent syncope after presenting with dizziness starting on the afternoon of 05/17/2024.  She underwent ICD placement on 05/13/2024.  Over the last 2 days, she conveys that she has felt generalized weakness, increased sleepiness, leading up to development of dizziness starting today.  Not associated with any syncope or fall. Not associated with any CP.  No associated with any acute focal weakness or any acute focal numbness/paresthesias.  She does not believe that her ICD has fired.  In the ED, she is afebrile.  Presenting labs notable for mildly elevated white blood cell count.   EDP d/w on-call cardiology, Dr. Otelia, who will formally consult and has recommended CRP, ESR, blood cultures, procalcitonin, all of which have been ordered. Dr. Otelia conveys that the patient would not be eligible for MRI of the brain for the next 3 months given the close temporal proximity to her ICD placement.  ICD is being interrogated.  The patient has a history of pulmonary embolism and is on Eliquis  although this is currently being held until 05/19/2024 in the context of her recent ICD placement.  I have placed an order for observation to cardiac telemetry for further evaluation and management of her recurrent dizziness, presyncopal episodes, as above.  I have placed some additional preliminary admit orders via the adult multi-morbid admission order set. I have also ordered fall precautions.  There is an existing order for orthostatic vital signs to be obtained.  I have also ordered morning labs in the form of CMP, CBC, magnesium level.    Eva Pore, DO Hospitalist

## 2024-05-17 NOTE — ED Notes (Signed)
 Patient transported to CT

## 2024-05-17 NOTE — ED Provider Notes (Signed)
 [place] EMERGENCY DEPARTMENT AT [facility] Provider Note   CSN: [id number] Arrival date & time: 05/17/24  2114     Patient presents with: Weakness   Summer Christensen is a 73 y.o. female.   HPI     73 year old female with history of PE/DVT on Eliquis , advanced CHF status post ICD placement on 7-25 comes in with chief complaint of dizziness, near fainting.  Patient states that she was just feeling weak and unwell today.  She has been sleeping a lot.  Around 2 PM, she started having some headache.  Headaches are mild, generalized.  She has no neck pain.  She also started experiencing dizziness thereafter.  Dizziness is described as feeling unsteady when she walks and having episodes where she feels like she is going to faint.  There is no vertigo-like symptoms.  Patient denies any associated one-sided weakness, numbness, slurred speech, vision changes, double vision, difficulty swallowing.  Patient denies any history of strokes.  She has no chest pain.  Review of system is positive for some shortness of breath with exertion.  Prior to Admission medications   Medication Sig Start Date End Date Taking? Authorizing Provider  apixaban  (ELIQUIS ) 5 MG TABS tablet Take 1 tablet (5 mg total) by mouth 2 (two) times daily. 05/19/24   Mealor, Augustus E, MD  carvedilol  (COREG ) 12.5 MG tablet TAKE ONE TABLET BY MOUTH TWICE DAILY 10/07/23   Mallipeddi, Vishnu P, MD  digoxin  (LANOXIN ) 0.125 MG tablet Take 1 tablet (0.125 mg total) by mouth daily. 05/04/23   Sabharwal, Aditya, DO  ferrous sulfate 325 (65 FE) MG tablet Take 325 mg by mouth daily with breakfast.    [provider]  furosemide  (LASIX ) 40 MG tablet TAKE 40MG  DAILY, MAY TAKE ADDITIONAL DOSE OF LASIX  40MG  AS NEEDED FOR SWELLING Patient taking differently: Take 40 mg by mouth in the morning and at bedtime. 05/29/23   Sabharwal, Ria, DO  Multiple Vitamin (MULTIVITAMIN WITH MINERALS) TABS tablet Take 1 tablet by mouth 4 (four)  times a week. Women's Alive 50+    [provider]  sacubitril -valsartan  (ENTRESTO ) 97-103 MG Take 1 tablet by mouth 2 (two) times daily. 05/04/23   Sabharwal, Aditya, DO  sitaGLIPtin (JANUVIA) 50 MG tablet Take 50 mg by mouth in the morning.    [provider]    Allergies: Jardiance  [empagliflozin ], Metformin , and Metformin  and related    Review of Systems  All other systems reviewed and are negative.   Updated Vital Signs BP (!) 161/85   Pulse 77   Temp (!) 97.1 F (36.2 C) (Oral)   Resp 19   Ht 5' 4 (1.626 m)   Wt 65.3 kg   SpO2 99%   BMI 24.72 kg/m   Physical Exam Vitals and nursing note reviewed.  Constitutional:      Appearance: She is well-developed.  HENT:     Head: Atraumatic.  Eyes:     Extraocular Movements: Extraocular movements intact.     Pupils: Pupils are equal, round, and reactive to light.     Comments: No nystagmus, peripheral visual field intact  Cardiovascular:     Rate and Rhythm: Normal rate.  Pulmonary:     Effort: Pulmonary effort is normal.  Musculoskeletal:     Cervical back: Normal range of motion and neck supple.     Right lower leg: No edema.     Left lower leg: No edema.  Skin:    General: Skin is warm and dry.  Neurological:     Mental Status: She is alert and oriented to person, place, and time.     Cranial Nerves: No cranial nerve deficit.     Sensory: No sensory deficit.     Motor: No weakness.     Coordination: Coordination normal.     (all labs ordered are listed, but only abnormal results are displayed) Labs Reviewed  COMPREHENSIVE METABOLIC PANEL WITH GFR - Abnormal; Notable for the following components:      Result Value   Glucose, Bld 193 (*)    Creatinine, Ser 1.06 (*)    GFR, Estimated 56 (*)    All other components within normal limits  CBC - Abnormal; Notable for the following components:   WBC 14.5 (*)    Hemoglobin 11.1 (*)    HCT 33.3 (*)    All other components within normal limits   DIGOXIN  LEVEL - Abnormal; Notable for the following components:   Digoxin  Level <0.4 (*)    All other components within normal limits  CULTURE, BLOOD (ROUTINE X 2)  CULTURE, BLOOD (ROUTINE X 2)  URINALYSIS, ROUTINE W REFLEX MICROSCOPIC  BRAIN NATRIURETIC PEPTIDE  CBC WITH DIFFERENTIAL/PLATELET  SEDIMENTATION RATE  C-REACTIVE PROTEIN  PROCALCITONIN  CBC WITH DIFFERENTIAL/PLATELET  COMPREHENSIVE METABOLIC PANEL WITH GFR  MAGNESIUM  CBG MONITORING, ED    EKG: None  ED ECG REPORT   Date: 05/17/2024  Rate: 71  Rhythm: Paced rhythm  QRS Axis: left Patient has T wave inversions in inferior and lateral leads that are not new.  I have personally reviewed the EKG tracing and agree with the computerized printout as noted.   Radiology: CT HEAD WO CONTRAST Result Date: 05/17/2024 CLINICAL DATA:  Headaches EXAM: CT HEAD WITHOUT CONTRAST TECHNIQUE: Contiguous axial images were obtained from the base of the skull through the vertex without intravenous contrast. RADIATION DOSE REDUCTION: This exam was performed according to the departmental dose-optimization program which includes automated exposure control, adjustment of the mA and/or kV according to patient size and/or use of iterative reconstruction technique. COMPARISON:  None Available. FINDINGS: Brain: No evidence of acute infarction, hemorrhage, hydrocephalus, extra-axial collection or mass lesion/mass effect. Vascular: No hyperdense vessel or unexpected calcification. Skull: Normal. Negative for fracture or focal lesion. Sinuses/Orbits: No acute finding. Other: None. IMPRESSION: No acute intracranial abnormality noted. Electronically Signed   By: Oneil Devonshire M.D.   On: 05/17/2024 22:52   DG Chest Port 1 View Result Date: 05/17/2024 CLINICAL DATA:  Syncopal episode EXAM: PORTABLE CHEST 1 VIEW COMPARISON:  05/13/2024 FINDINGS: Cardiac shadow is stable. Defibrillator is again noted. Lungs are well aerated bilaterally infiltrate or sizable  effusion is seen. Some minimal scarring in the left base is noted. IMPRESSION: No active disease. Electronically Signed   By: Oneil Devonshire M.D.   On: 05/17/2024 22:39     Procedures   Medications Ordered in the ED  acetaminophen  (TYLENOL ) tablet 650 mg (has no administration in time range)    Or  acetaminophen  (TYLENOL ) suppository 650 mg (has no administration in time range)  melatonin tablet 3 mg (has no administration in time range)  ondansetron  (ZOFRAN ) injection 4 mg (has no administration in time range)                                    Medical Decision Making Amount and/or Complexity of Data Reviewed Labs: ordered. Radiology: ordered.  Risk Decision regarding hospitalization.  73 year old female with history of PE on DOAC, advanced CHF with ICD placement recently comes in with chief complaint of weakness, dizziness, headache, shortness of breath.  I have reviewed patient's records including recent discharge summary, cardiology note, echocardiogram. Collateral history provided by patient's family members.  Differential diagnosis for this patient includes embolic event after pacemaker/ICD placement, orthostatic dizziness severe anemia, arrhythmia, worsening CHF, severe electrolyte abnormality. PE also possible.  Initial plan was to get basic labs.  Patient is neuroexam is reassuring.  She still feels ataxic, therefore stroke is a possibility.  Patient's ICD will be interrogated.  Nursing staff has been advised to ambulate the patient and get orthostatics.  Reassessment: Interrogation to be completed by nursing staff here.  They have not been able to ambulate patient given labs, CT etc.  I did consult cardiology.  They will see the patient.  Patient's white count has come back elevated at 14.  We will get blood cultures, inflammatory markers.  Patient is stable for admission at this time.  Not a candidate for TNK given that she is on anticoagulation.  LVO screen  negative.  Patient was unable to lift her left upper extremity given the pain from ICD placement.  However, doubt this is LVO.  NIH stroke scale is 0.  Final diagnoses:  Near syncope  Ataxia  Leukocytosis, unspecified type    ED Discharge Orders     None          Charlyn Sora, MD 05/18/24 0000

## 2024-05-18 DIAGNOSIS — R42 Dizziness and giddiness: Secondary | ICD-10-CM | POA: Diagnosis not present

## 2024-05-18 DIAGNOSIS — R55 Syncope and collapse: Secondary | ICD-10-CM

## 2024-05-18 LAB — URINALYSIS, ROUTINE W REFLEX MICROSCOPIC
Bilirubin Urine: NEGATIVE
Glucose, UA: NEGATIVE mg/dL
Hgb urine dipstick: NEGATIVE
Ketones, ur: NEGATIVE mg/dL
Leukocytes,Ua: NEGATIVE
Nitrite: NEGATIVE
Protein, ur: NEGATIVE mg/dL
Specific Gravity, Urine: 1.01 (ref 1.005–1.030)
pH: 6 (ref 5.0–8.0)

## 2024-05-18 LAB — CBC WITH DIFFERENTIAL/PLATELET
Abs Immature Granulocytes: 0.06 K/uL (ref 0.00–0.07)
Abs Immature Granulocytes: 0.06 K/uL (ref 0.00–0.07)
Basophils Absolute: 0 K/uL (ref 0.0–0.1)
Basophils Absolute: 0 K/uL (ref 0.0–0.1)
Basophils Relative: 0 %
Basophils Relative: 0 %
Eosinophils Absolute: 0.2 K/uL (ref 0.0–0.5)
Eosinophils Absolute: 0.1 K/uL (ref 0.0–0.5)
Eosinophils Relative: 1 %
Eosinophils Relative: 0 %
HCT: 32.9 % — ABNORMAL LOW (ref 36.0–46.0)
HCT: 32.6 % — ABNORMAL LOW (ref 36.0–46.0)
Hemoglobin: 11.2 g/dL — ABNORMAL LOW (ref 12.0–15.0)
Hemoglobin: 11.2 g/dL — ABNORMAL LOW (ref 12.0–15.0)
Immature Granulocytes: 0 %
Immature Granulocytes: 0 %
Lymphocytes Relative: 13 %
Lymphocytes Relative: 12 %
Lymphs Abs: 1.9 K/uL (ref 0.7–4.0)
Lymphs Abs: 1.6 K/uL (ref 0.7–4.0)
MCH: 28.9 pg (ref 26.0–34.0)
MCH: 28.9 pg (ref 26.0–34.0)
MCHC: 34 g/dL (ref 30.0–36.0)
MCHC: 34.4 g/dL (ref 30.0–36.0)
MCV: 85 fL (ref 80.0–100.0)
MCV: 84 fL (ref 80.0–100.0)
Monocytes Absolute: 0.6 K/uL (ref 0.1–1.0)
Monocytes Absolute: 0.5 K/uL (ref 0.1–1.0)
Monocytes Relative: 4 %
Monocytes Relative: 3 %
Neutro Abs: 11.5 K/uL — ABNORMAL HIGH (ref 1.7–7.7)
Neutro Abs: 11.8 K/uL — ABNORMAL HIGH (ref 1.7–7.7)
Neutrophils Relative %: 82 %
Neutrophils Relative %: 85 %
Platelets: 170 K/uL (ref 150–400)
Platelets: 164 K/uL (ref 150–400)
RBC: 3.87 MIL/uL (ref 3.87–5.11)
RBC: 3.88 MIL/uL (ref 3.87–5.11)
RDW: 12.4 % (ref 11.5–15.5)
RDW: 12.3 % (ref 11.5–15.5)
WBC: 14.4 K/uL — ABNORMAL HIGH (ref 4.0–10.5)
WBC: 14.1 K/uL — ABNORMAL HIGH (ref 4.0–10.5)
nRBC: 0 % (ref 0.0–0.2)
nRBC: 0 % (ref 0.0–0.2)

## 2024-05-18 LAB — COMPREHENSIVE METABOLIC PANEL WITH GFR
ALT: 20 U/L (ref 0–44)
AST: 21 U/L (ref 15–41)
Albumin: 3.4 g/dL — ABNORMAL LOW (ref 3.5–5.0)
Alkaline Phosphatase: 61 U/L (ref 38–126)
Anion gap: 10 (ref 5–15)
BUN: 15 mg/dL (ref 8–23)
CO2: 22 mmol/L (ref 22–32)
Calcium: 9.6 mg/dL (ref 8.9–10.3)
Chloride: 106 mmol/L (ref 98–111)
Creatinine, Ser: 1.05 mg/dL — ABNORMAL HIGH (ref 0.44–1.00)
GFR, Estimated: 56 mL/min — ABNORMAL LOW (ref >60)
Glucose, Bld: 166 mg/dL — ABNORMAL HIGH (ref 70–99)
Potassium: 4.1 mmol/L (ref 3.5–5.1)
Sodium: 138 mmol/L (ref 135–145)
Total Bilirubin: 0.6 mg/dL (ref 0.0–1.2)
Total Protein: 6.3 g/dL — ABNORMAL LOW (ref 6.5–8.1)

## 2024-05-18 LAB — HEMOGLOBIN A1C
Hgb A1c MFr Bld: 6.9 % — ABNORMAL HIGH (ref 4.8–5.6)
Mean Plasma Glucose: 151.33 mg/dL

## 2024-05-18 LAB — GLUCOSE, CAPILLARY
Glucose-Capillary: 137 mg/dL — ABNORMAL HIGH (ref 70–99)
Glucose-Capillary: 182 mg/dL — ABNORMAL HIGH (ref 70–99)

## 2024-05-18 LAB — PROCALCITONIN: Procalcitonin: <0.1 ng/mL

## 2024-05-18 LAB — BRAIN NATRIURETIC PEPTIDE: B Natriuretic Peptide: 440.4 pg/mL — ABNORMAL HIGH (ref 0.0–100.0)

## 2024-05-18 LAB — MAGNESIUM: Magnesium: 2.1 mg/dL (ref 1.7–2.4)

## 2024-05-18 LAB — CBG MONITORING, ED
Glucose-Capillary: 152 mg/dL — ABNORMAL HIGH (ref 70–99)
Glucose-Capillary: 133 mg/dL — ABNORMAL HIGH (ref 70–99)

## 2024-05-18 LAB — C-REACTIVE PROTEIN: CRP: 0.6 mg/dL (ref <1.0)

## 2024-05-18 LAB — SEDIMENTATION RATE: Sed Rate: 19 mm/h (ref 0–22)

## 2024-05-18 MED ORDER — INSULIN ASPART 100 UNIT/ML IJ SOLN
0.0000 [IU] | Freq: Three times a day (TID) | INTRAMUSCULAR | Status: DC
  Administered 2024-05-18 (×2): 2 [IU] via SUBCUTANEOUS
  Administered 2024-05-19 (×2): 3 [IU] via SUBCUTANEOUS
  Administered 2024-05-19: 5 [IU] via SUBCUTANEOUS
  Administered 2024-05-20: 3 [IU] via SUBCUTANEOUS

## 2024-05-18 MED ORDER — APIXABAN 5 MG PO TABS
5.0000 mg | ORAL_TABLET | Freq: Two times a day (BID) | ORAL | Status: DC
Start: 2024-05-19 — End: 2024-05-20
  Administered 2024-05-19 – 2024-05-20 (×3): 5 mg via ORAL
  Filled 2024-05-18 (×3): qty 1

## 2024-05-18 MED ORDER — INSULIN ASPART 100 UNIT/ML IJ SOLN: 0.0000 [IU] | Freq: Every day | INTRAMUSCULAR | Status: DC

## 2024-05-18 MED ORDER — FUROSEMIDE 10 MG/ML IJ SOLN
40.0000 mg | Freq: Two times a day (BID) | INTRAMUSCULAR | Status: DC
  Administered 2024-05-18 – 2024-05-19 (×3): 40 mg via INTRAVENOUS
  Filled 2024-05-18 (×3): qty 4

## 2024-05-18 MED ORDER — CARVEDILOL 12.5 MG PO TABS
12.5000 mg | ORAL_TABLET | Freq: Two times a day (BID) | ORAL | Status: DC
  Administered 2024-05-18 – 2024-05-20 (×5): 12.5 mg via ORAL
  Filled 2024-05-18 (×5): qty 1

## 2024-05-18 MED ORDER — SACUBITRIL-VALSARTAN 97-103 MG PO TABS
1.0000 | ORAL_TABLET | Freq: Two times a day (BID) | ORAL | Status: DC
  Administered 2024-05-18 – 2024-05-20 (×5): 1 via ORAL
  Filled 2024-05-18 (×6): qty 1

## 2024-05-18 MED ORDER — DIGOXIN 125 MCG PO TABS
0.1250 mg | ORAL_TABLET | Freq: Every day | ORAL | Status: DC
  Administered 2024-05-18 – 2024-05-20 (×3): 0.125 mg via ORAL
  Filled 2024-05-18 (×3): qty 1

## 2024-05-18 MED ORDER — ZOLPIDEM TARTRATE 5 MG PO TABS
2.5000 mg | ORAL_TABLET | Freq: Once | ORAL | Status: AC
  Administered 2024-05-18: 2.5 mg via ORAL
  Filled 2024-05-18: qty 1

## 2024-05-18 NOTE — ED Notes (Signed)
 Called and placed PT on monitor with CCMD.

## 2024-05-18 NOTE — TOC CM/SW Note (Signed)
 Transition of Care [facility]) - Inpatient Brief Assessment   Patient Details  Name: KIRIANA WORTHINGTON MRN: [id number] Date of Birth: [birth year]  Transition of Care [facility]) CM/SW Contact:    Waddell Barnie Rama, RN Phone Number: 05/18/2024, 4:41 PM   Clinical Narrative: From home with son, has PCP, [facility] and insurance on file, states has no HH services in place at this time or DME at home.  States family member will transport them home at [employer] and family is support system, states gets medications from [facility] in [place].  Pta self ambulatory.       Transition of Care Asessment: Insurance and Status: Insurance coverage has been reviewed Patient has primary care physician: Yes Home environment has been reviewed: lives with son Prior level of function:: indep Prior/Current Home Services: No current home services Social Drivers of Health Review: SDOH reviewed no interventions necessary Readmission risk has been reviewed: Yes Transition of care needs: no transition of care needs at this time

## 2024-05-18 NOTE — H&P (Signed)
 History and Physical    Patient: Summer Christensen FMW:[id number] DOB: 1951/06/16 DOA: 05/17/2024 DOS: the patient was seen and examined on 05/18/2024 PCP: [facility]  Patient coming from: Home  Chief Complaint:  Chief Complaint  Patient presents with   Weakness   HPI: Summer Christensen is a 73 y.o. female with medical history significant of HTN, LBBB, NICM with LVEF of 25-30% s/p CRT-ICD with LBBAP on 7/25, RLE DVT and LUL PE in 07/2022 on Eliquis  who is admitted with presyncope.  Pt reports being in her USOH until yesterday at 1900 when she stood up and almost collapsed. Pt reports not feeling well the past few days and being on the mend since her recent AICD placement on 7/25. She endorsed good PO intake, and was recovering well until she stood up from sitting on yesterday at 1900 and had to sit back down immediately for fear she would pass out. Pt denies LOC or head strike. She mentions that this has happened to her in the past, but with no regularity. She endorses being much improved since admission, but did have an episode of unexplained diffuse body shaking last night. Pt denies a history of seizures.   In the ED, pt hypertensive. Labs notable for Cr 1.06, BNP 440, and WBC 14.5. Digoxin  level <0.4. CTH w/ NAICA. CXR w/o active disease. Blood cultures pending. EDP consulted cards and admitted pt to medicine for ongoing care.   Review of Systems: As mentioned in the history of present illness. All other systems reviewed and are negative. 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   Chest pain    CHF (congestive heart failure) (HCC)    Hx of hysterectomy    Hypertension    Migraine    Past Surgical History:  Procedure Laterality Date   ABDOMINAL HYSTERECTOMY     BIV ICD INSERTION CRT-D N/A 05/13/2024   Procedure: BIV ICD INSERTION CRT-D;  Surgeon: Nancey Eulas BRAVO, MD;  [facility];  Service: Cardiovascular;   Laterality: N/A;   COLONOSCOPY N/A 06/11/2016   Procedure: COLONOSCOPY;  Surgeon: Claudis RAYMOND Rivet, MD;  Location: [facility];  Service: Endoscopy;  Laterality: N/A;  2:00 - moved to 1:00 - Ann notified pt   LAPAROSCOPIC CHOLECYSTECTOMY  2005   pain.   LEAD INSERTION N/A 05/13/2024   Procedure: LEAD INSERTION;  Surgeon: Nancey Eulas BRAVO, MD;  Location: [facility];  Service: Cardiovascular;  Laterality: N/A;   ROTATOR CUFF REPAIR  2009   arthroscopic   TUBAL LIGATION     bilateral   Social History:  reports that she has never smoked. She has never been exposed to tobacco smoke. She has never used smokeless tobacco. She reports that she does not drink alcohol and does not use drugs.  Allergies  Allergen Reactions   Jardiance  [Empagliflozin ] Itching and Swelling   Glucophage  [Metformin ] Itching, Dermatitis and Rash    Family History  Problem Relation Age of Onset   Stroke Mother    Heart attack Father     Prior to Admission medications   Medication Sig Start Date End Date Taking? Authorizing Provider  carvedilol  (COREG ) 12.5 MG tablet TAKE ONE TABLET BY MOUTH TWICE DAILY 10/07/23  Yes Mallipeddi, Vishnu P, MD  Ferrous Sulfate (IRON PO) Take 1 tablet by mouth daily.   Yes [provider]  furosemide  (LASIX ) 40 MG tablet TAKE 40MG  DAILY, MAY TAKE ADDITIONAL  DOSE OF LASIX  40MG  AS NEEDED FOR SWELLING Patient taking differently: Take 40 mg by mouth in the morning and at bedtime. 05/29/23  Yes Sabharwal, Aditya, DO  Ibuprofen-diphenhydrAMINE  Cit (ADVIL PM PO) Take 1 tablet by mouth at bedtime as needed (pain, sleep).   Yes [provider]  Multiple Vitamins-Minerals (WOMENS 50+ MULTI VITAMIN) TABS Take 1 tablet by mouth daily.   Yes [provider]  sacubitril -valsartan  (ENTRESTO ) 97-103 MG Take 1 tablet by mouth 2 (two) times daily. 05/04/23  Yes Sabharwal, Aditya, DO  sitaGLIPtin (JANUVIA) 50 MG tablet Take 50 mg by mouth in the morning.   Yes [provider]  apixaban  (ELIQUIS ) 5 MG TABS tablet Take 1 tablet (5 mg total) by mouth 2 (two) times daily. Patient not taking: Reported on 05/18/2024 05/19/24   Mealor, Eulas BRAVO, MD    Physical Exam: Vitals:   05/18/24 1100 05/18/24 1445 05/18/24 1450 05/18/24 1500  BP: (!) 153/67 (!) 159/78  (!) 113/54  Pulse: 63 72 67 65  Resp: 16 18 15 15   Temp:  98 F (36.7 C)    TempSrc:      SpO2: 99% 94% 94% 95%  Weight:      Height:       General: Alert, oriented x3, resting comfortably in no acute distress Respiratory: Lungs clear to auscultation bilaterally with normal respiratory effort; no w/r/r Cardiovascular: Regular rate and rhythm w/o m/r/g   Data Reviewed:  Lab Results  Component Value Date   WBC 14.1 (H) 05/18/2024   HGB 11.2 (L) 05/18/2024   HCT 32.6 (L) 05/18/2024   MCV 84.0 05/18/2024   PLT 164 05/18/2024   Lab Results  Component Value Date   GLUCOSE 166 (H) 05/18/2024   CALCIUM  9.6 05/18/2024   NA 138 05/18/2024   K 4.1 05/18/2024   CO2 22 05/18/2024   CL 106 05/18/2024   BUN 15 05/18/2024   CREATININE 1.05 (H) 05/18/2024   Lab Results  Component Value Date   ALT 20 05/18/2024   AST 21 05/18/2024   ALKPHOS 61 05/18/2024   BILITOT 0.6 05/18/2024   Lab Results  Component Value Date   INR 1.1 04/19/2023   INR 1.1 08/10/2022    Radiology: CT HEAD WO CONTRAST Result Date: 05/17/2024 CLINICAL DATA:  Headaches EXAM: CT HEAD WITHOUT CONTRAST TECHNIQUE: Contiguous axial images were obtained from the base of the skull through the vertex without intravenous contrast. RADIATION DOSE REDUCTION: This exam was performed according to the departmental dose-optimization program which includes automated exposure control, adjustment of the mA and/or kV according to patient size and/or use of iterative reconstruction technique. COMPARISON:  None Available. FINDINGS: Brain: No evidence of acute infarction, hemorrhage, hydrocephalus, extra-axial collection or mass  lesion/mass effect. Vascular: No hyperdense vessel or unexpected calcification. Skull: Normal. Negative for fracture or focal lesion. Sinuses/Orbits: No acute finding. Other: None. IMPRESSION: No acute intracranial abnormality noted. Electronically Signed   By: Oneil Devonshire M.D.   On: 05/17/2024 22:52   DG Chest Port 1 View Result Date: 05/17/2024 CLINICAL DATA:  Syncopal episode EXAM: PORTABLE CHEST 1 VIEW COMPARISON:  05/13/2024 FINDINGS: Cardiac shadow is stable. Defibrillator is again noted. Lungs are well aerated bilaterally infiltrate or sizable effusion is seen. Some minimal scarring in the left base is noted. IMPRESSION: No active disease. Electronically Signed   By: Oneil Devonshire M.D.   On: 05/17/2024 22:39    Assessment and Plan: 29F h/o HTN, LBBB, NICM with LVEF of 25-30% s/p CRT-ICD  with LBBAP on 7/25, RLE DVT and LUL PE in 07/2022 on Eliquis  who is admitted with presyncope.  Presyncope H/o NICM with LVEF of 25-30% s/p CRT-ICD with LBBAP High suspicion for orthostatic hypotension -Cards consulted; apprec eval/recs -Tele per protocol -Avoid IVF for now given suspicion for ADHF per below -Defer TTE given recently performed in 03/2024  HFrEF exacerbation Elevated BNP Undetectable digoxin  level on admission which could have contributed to aforementioned -IV lasix  40mg  BID for now -PTA Entresto , Coreg  and digoxin   H/o RLE DVT and LUL PE in 07/2022  -PTA apixaban  5mg  BID   Advance Care Planning:   Code Status: Full Code   Consults: Cards  Family Communication: Daughter, Suzen  Severity of Illness: The appropriate patient status for this patient is INPATIENT. Inpatient status is judged to be reasonable and necessary in order to provide the required intensity of service to ensure the patient's safety. The patient's presenting symptoms, physical exam findings, and initial radiographic and laboratory data in the context of their chronic comorbidities is felt to place them at high  risk for further clinical deterioration. Furthermore, it is not anticipated that the patient will be medically stable for discharge from the hospital within 2 midnights of admission.   * I certify that at the point of admission it is my clinical judgment that the patient will require inpatient hospital care spanning beyond 2 midnights from the point of admission due to high intensity of service, high risk for further deterioration and high frequency of surveillance required.*   ------- I spent 60 minutes reviewing previous labs/notes, obtaining separate history at the bedside, counseling/discussing the treatment plan outlined above, ordering medications/tests, and performing clinical documentation.  Author: Marsha Ada, MD 05/18/2024 3:13 PM  For on call review [email or web address].

## 2024-05-18 NOTE — Care Management Obs Status (Signed)
 MEDICARE OBSERVATION STATUS NOTIFICATION   Patient Details  Name: Summer Christensen MRN: [id number] Date of Birth: [birth year]   Medicare Observation Status Notification Given:  Yes    Waddell Barnie Rama, RN 05/18/2024, 4:40 PM

## 2024-05-18 NOTE — ED Notes (Signed)
RN assisted pt to restroom using wheelchair

## 2024-05-18 NOTE — Progress Notes (Signed)
 Heart Failure Navigator Progress Note  Assessed for Heart & Vascular TOC clinic readiness.  Patient does not meet criteria due to Advanced Heart Failure Team patient of Dr. Gasper Lloyd.   Navigator will sign off at this time.   Rhae Hammock, BSN, Scientist, clinical (histocompatibility and immunogenetics) Only

## 2024-05-18 NOTE — Consult Note (Addendum)
 ELECTROPHYSIOLOGY CONSULT NOTE    Patient ID: [name] MRN: [id number], DOB/AGE: [birth year] 73 y.o.  Admit date: 05/17/2024 Date of Consult: 05/18/2024  Primary Physician: [facility] Primary Cardiologist: Vishnu P Mallipeddi, MD  Electrophysiologist: Dr. Nancey    Patient Profile: Summer Christensen is a 73 y.o. female with a history of HFrEF, NICM s/p ICD (last week), PE, HTN  who is being seen today for the evaluation of concern for infection at the request of Dr. Georgina.  HPI:  Summer Christensen is a 73 y.o. female with PMH as above. Had ICD implant 7/25 by Dr. Nancey. She had been doing well at home until yesterday. Her L chest implant site cnotinues to be tender, but pain has been improving every day.  Yesterday, she felt more weak than normal, thought she was hungry so ate something but continued to feel generally unwell with fatigue, intermittent dizziness. She had no syncope.   Labs in ER notable for elevated WBC. Blood cultures drawn, NG at 12h, Urine nl, CRP nl, procalcitonin nl  EP consulted given she recently had ICD placement and concern for infectious cause of her symptoms. She has had no drainage from ICD site, no redness or warmth that she has noted. No fever or subjective chills.  Her weight has been stable around 140lbs.      Labs Potassium4.1 (07/30 0130) Magnesium  2.1 (07/30 0130) Creatinine, ser  1.05* (07/30 0130) PLT  164 (07/30 0130) HGB  11.2* (07/30 0130) WBC 14.1* (07/30 0130)  .    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   Chest pain    CHF (congestive heart failure) (HCC)    Hx of hysterectomy    Hypertension    Migraine      Surgical History:  Past Surgical History:  Procedure Laterality Date   ABDOMINAL HYSTERECTOMY     BIV ICD INSERTION CRT-D N/A 05/13/2024   Procedure: BIV ICD INSERTION CRT-D;  Surgeon: Nancey Eulas BRAVO, MD;  Location: [facility];  Service:  Cardiovascular;  Laterality: N/A;   COLONOSCOPY N/A 06/11/2016   Procedure: COLONOSCOPY;  Surgeon: Claudis RAYMOND Rivet, MD;  Location: [facility];  Service: Endoscopy;  Laterality: N/A;  2:00 - moved to 1:00 - Ann notified pt   LAPAROSCOPIC CHOLECYSTECTOMY  2005   pain.   LEAD INSERTION N/A 05/13/2024   Procedure: LEAD INSERTION;  Surgeon: Nancey Eulas BRAVO, MD;  Location: [facility];  Service: Cardiovascular;  Laterality: N/A;   ROTATOR CUFF REPAIR  2009   arthroscopic   TUBAL LIGATION     bilateral     (Not in a hospital admission)   Inpatient Medications:   [START ON 05/19/2024] apixaban   5 mg Oral BID   carvedilol   12.5 mg Oral BID   digoxin   0.125 mg Oral Daily   furosemide   40 mg Intravenous BID   insulin  aspart  0-15 Units Subcutaneous TID WC   insulin  aspart  0-5 Units Subcutaneous QHS   sacubitril -valsartan   1 tablet Oral BID    Allergies:  Allergies  Allergen Reactions   Jardiance  [Empagliflozin ] Itching and Swelling   Glucophage  [Metformin ] Itching, Dermatitis and Rash    Family History  Problem Relation Age of Onset   Stroke Mother    Heart attack Father      Physical Exam: Vitals:   05/18/24 0926 05/18/24 1041 05/18/24 1042 05/18/24 1045  BP:  ROLLEN)  144/66 (!) 144/66   Pulse:  62 63 63  Resp:  20 17 19   Temp: 97.9 F (36.6 C)     TempSrc:      SpO2:  99% 100% 100%  Weight:      Height:        GEN- NAD, A&O x 3, normal affect HEENT: Normocephalic, atraumatic Lungs- CTAB, Normal effort.  Heart- Regular rate and rhythm, No M/G/R.  GI- Soft, NT, ND.  Extremities- No clubbing, cyanosis, or edema   Radiology/Studies: CT HEAD WO CONTRAST Result Date: 05/17/2024 CLINICAL DATA:  Headaches EXAM: CT HEAD WITHOUT CONTRAST TECHNIQUE: Contiguous axial images were obtained from the base of the skull through the vertex without intravenous contrast. RADIATION DOSE REDUCTION: This exam was performed according to the departmental dose-optimization program  which includes automated exposure control, adjustment of the mA and/or kV according to patient size and/or use of iterative reconstruction technique. COMPARISON:  None Available. FINDINGS: Brain: No evidence of acute infarction, hemorrhage, hydrocephalus, extra-axial collection or mass lesion/mass effect. Vascular: No hyperdense vessel or unexpected calcification. Skull: Normal. Negative for fracture or focal lesion. Sinuses/Orbits: No acute finding. Other: None. IMPRESSION: No acute intracranial abnormality noted. Electronically Signed   By: Oneil Devonshire M.D.   On: 05/17/2024 22:52   DG Chest Port 1 View Result Date: 05/17/2024 CLINICAL DATA:  Syncopal episode EXAM: PORTABLE CHEST 1 VIEW COMPARISON:  05/13/2024 FINDINGS: Cardiac shadow is stable. Defibrillator is again noted. Lungs are well aerated bilaterally infiltrate or sizable effusion is seen. Some minimal scarring in the left base is noted. IMPRESSION: No active disease. Electronically Signed   By: Oneil Devonshire M.D.   On: 05/17/2024 22:39   DG Chest 2 View Result Date: 05/13/2024 CLINICAL DATA:  Cardiac device in-situ EXAM: CHEST - 2 VIEW COMPARISON:  Chest x-ray May 18, 2023 FINDINGS: The heart size and mediastinal contours are within normal limits. Both lungs are clear. Linear scarring/atelectasis in left lung base is stable to prior. The visualized skeletal structures are unremarkable. Left chest wall ICD in place. Right upper quadrant cholecystectomy clips. IMPRESSION: No active cardiopulmonary disease. Biventricular ICD. Electronically Signed   By: Megan  Zare M.D.   On: 05/13/2024 18:46   EP PPM/ICD IMPLANT Result Date: 05/13/2024 Table formatting from the original result was not included.  SURGEON:  Eulas Furbish, MD    PREPROCEDURE DIAGNOSES:  1. CHFrEF  2. LBBB    POSTPROCEDURE DIAGNOSES:  1. Unchanged from preprocedure    PROCEDURES:   1. Placement of a CRT-ICD with LBB pacing lead  2. Coronary sinus venogram    INTRODUCTION: Summer Christensen is a 73 y.o. patient who presents to the EP lab for BiV ICD implantation.    DESCRIPTION OF PROCEDURE:  Informed written consent was obtained and the patient was brought to the electrophysiology lab in the fasting state. The patient  was adequately sedated with intravenous Versed , and fentanyl  as outlined in the nursing report.  The patient's left chest was prepped and draped in the usual sterile fashion by the EP lab staff.  The skin overlying the left deltopectoral region was infiltrated with lidocaine  for local analgesia.  An incision was created over the left deltopectoral region.  A left subcutaneous pocket was fashioned using a combination of sharp and blunt dissection immediately superficial to the pectoral fascia.  Electrocautery was used to assure hemostasis. Lead Placement: The left axillary vein was cannulated using modified seldinger techniques.  Through the left axillary vein, an RV ICD  lead was advanced to the RVapex under fluoroscopic guidance. Lead parameters are detailed below. The lead was secured to the pectoral fascia with two loops of 0-Ethibond. Next, the coronary sinus was cannulated with a wholey wire via a coronary sinus guide and the guide advanced into the coronary sinus. A CS venogram was obtained with approximately 10 ccs of contrast using a balloon-tipped wire. There was a very diminutive and tortuous lateral branch that was not a feasible candidate. There was also a very large inferolateral vein. The vein was large through its entire course to the apex, and I suspect it supplies the majority of drainage for the inferior aspect of the heart rather than a middle cardiac vein. I did not think this branch was an excellent option because the lead would have to have been placed very distally, at the apex, to wedge appropriately. I instead opted to place a LBB lead. I then exchanged the CS guide for a LBB guide and mapped the RV septum. I located a good W signal at the mid septum and  advanced the lead, resulting in a good r' and LVAT of 80 msec. Prior to this, I did test a few other areas and the best LVAT achieved was 96 msec. Next, the right atrial lead was advanced to the RA appendage. It was secured to the pectoral fascia with two loops of 0-Ethibond.  Lead parameters are detailed below. Finally, the coronary sinus guide was split and the lead secured to the pectoral fascia with two loops of 0-Ethibond.  RA RV LBB Model 1231 7122 1231 Serial # [id number] Amplitude 3.6 mV > 12 mV NA mV Threshold 0.75 V @ 0.5 ms 0.5 V @ 0.5 ms 1.25 V @ 0.5 ms Impedence 400 ohms 560 ohms 680 ohms The pocket was irrigated with copious gentamicin  solution.  The leads were then  connected to a pulse generator (model [place], serial [id number]). The pocket was closed in layers of absorbable suture. EBL < 10mL. Steri-strips and a sterile dressing were applied.  During this procedure the patient is administered Versed  and Fentanyl  by a trained provider under my direct supervision to achieve and maintain moderate conscious sedation.  The patient's heart rate, blood pressure, and oxygen saturation are monitored continuously during the procedure. The period of conscious sedation is 83 minutes, of which I was present face-to-face 100% of this time.    CONCLUSIONS:  1. Successful implantation of an ICD with LBB pacing  3.  No early apparent complications. Eulas Furbish, MD Cardiac Electrophysiology    EKG:05/17/2024 - As-VP, QRS narrow (personally reviewed)  TELEMETRY: AS-VP at 60-80s (personally reviewed)  DEVICE HISTORY: CRT-D implanted 05/13/2024 by Dr. Furbish, dx HFrEF Lead measurements stable  No concerning ventricular or atrial arrhythmias recorded   Assessment/Plan: #) NICM #) HFrEF s/p CRT-D implant #) weakness, dizziness   Patietn presented to ER yesterday with weakness, dizziness, fatigue. No change in medications, weight stable, no overt s/s of HF exacerbation Her L  chest ICD site appears to be healing well - no redness, drainage. Incision is c/d/I, steri-strips remain in place. The site is painful, but patient reports pain has been improving. Blood cultures NGTD at 12h  Infectious workup continues by primary team     EP will sign off at this time, if blood cultures result positive, please reconsult     For questions or updates, please contact [facility] Please consult [email or web address] for contact info under Cardiology/STEMI.  Signed, Suzann Riddle, NP  05/18/2024 1:34 PM  EP Attending  Patient seen and examined. Agree with the findings as noted above. The patient has had a recent (5 days ago) ICD insertion with some initial bleeding. She presents with pain at her site. Blood cultures are negative to date.   On exam she is a pleasant 73 yo woman, NAD. Lungs are clear, CV with a RRR and Ext with trace edema. Tele with AV pacing  A/P Recent ICD insertion - agree with above. If blood cultures are negative, no additional treatment and she can be discharged home.   Danelle Judythe Postema,MD

## 2024-05-18 NOTE — Plan of Care (Signed)
  Problem: Education: Goal: Ability to describe self-care measures that may prevent or decrease complications (Diabetes Survival Skills Education) will improve Outcome: Progressing Goal: Individualized Educational Video(s) Outcome: Progressing   Problem: Coping: Goal: Ability to adjust to condition or change in health will improve Outcome: Progressing   Problem: Fluid Volume: Goal: Ability to maintain a balanced intake and output will improve Outcome: Progressing   Problem: Health Behavior/Discharge Planning: Goal: Ability to identify and utilize available resources and services will improve Outcome: Progressing Goal: Ability to manage health-related needs will improve Outcome: Progressing   Problem: Nutritional: Goal: Maintenance of adequate nutrition will improve Outcome: Progressing Goal: Progress toward achieving an optimal weight will improve Outcome: Progressing   Problem: Tissue Perfusion: Goal: Adequacy of tissue perfusion will improve Outcome: Progressing   Problem: Activity: Goal: Risk for activity intolerance will decrease Outcome: Progressing   Problem: Nutrition: Goal: Adequate nutrition will be maintained Outcome: Progressing   Problem: Coping: Goal: Level of anxiety will decrease Outcome: Progressing   Problem: Elimination: Goal: Will not experience complications related to bowel motility Outcome: Progressing Goal: Will not experience complications related to urinary retention Outcome: Progressing

## 2024-05-19 DIAGNOSIS — R42 Dizziness and giddiness: Secondary | ICD-10-CM | POA: Diagnosis not present

## 2024-05-19 DIAGNOSIS — R55 Syncope and collapse: Secondary | ICD-10-CM | POA: Diagnosis not present

## 2024-05-19 LAB — BASIC METABOLIC PANEL WITH GFR
Anion gap: 9 (ref 5–15)
BUN: 24 mg/dL — ABNORMAL HIGH (ref 8–23)
CO2: 26 mmol/L (ref 22–32)
Calcium: 9.6 mg/dL (ref 8.9–10.3)
Chloride: 100 mmol/L (ref 98–111)
Creatinine, Ser: 1.52 mg/dL — ABNORMAL HIGH (ref 0.44–1.00)
GFR, Estimated: 36 mL/min — ABNORMAL LOW (ref >60)
Glucose, Bld: 330 mg/dL — ABNORMAL HIGH (ref 70–99)
Potassium: 3.8 mmol/L (ref 3.5–5.1)
Sodium: 135 mmol/L (ref 135–145)

## 2024-05-19 LAB — GLUCOSE, CAPILLARY
Glucose-Capillary: 226 mg/dL — ABNORMAL HIGH (ref 70–99)
Glucose-Capillary: 194 mg/dL — ABNORMAL HIGH (ref 70–99)
Glucose-Capillary: 165 mg/dL — ABNORMAL HIGH (ref 70–99)
Glucose-Capillary: 126 mg/dL — ABNORMAL HIGH (ref 70–99)

## 2024-05-19 LAB — CBC
HCT: 38 % (ref 36.0–46.0)
Hemoglobin: 13.1 g/dL (ref 12.0–15.0)
MCH: 29 pg (ref 26.0–34.0)
MCHC: 34.5 g/dL (ref 30.0–36.0)
MCV: 84.1 fL (ref 80.0–100.0)
Platelets: 196 K/uL (ref 150–400)
RBC: 4.52 MIL/uL (ref 3.87–5.11)
RDW: 12.4 % (ref 11.5–15.5)
WBC: 14.4 K/uL — ABNORMAL HIGH (ref 4.0–10.5)
nRBC: 0 % (ref 0.0–0.2)

## 2024-05-19 MED ORDER — HYDRALAZINE HCL 20 MG/ML IJ SOLN: 10.0000 mg | INTRAMUSCULAR | Status: DC | PRN

## 2024-05-19 MED ORDER — SODIUM CHLORIDE 0.9 % IV BOLUS
500.0000 mL | Freq: Once | INTRAVENOUS | Status: AC
  Administered 2024-05-19: 250 mL via INTRAVENOUS

## 2024-05-19 MED ORDER — IPRATROPIUM-ALBUTEROL 0.5-2.5 (3) MG/3ML IN SOLN: 3.0000 mL | RESPIRATORY_TRACT | Status: DC | PRN

## 2024-05-19 MED ORDER — LINAGLIPTIN 5 MG PO TABS
5.0000 mg | ORAL_TABLET | Freq: Every day | ORAL | Status: DC
  Administered 2024-05-19 – 2024-05-20 (×2): 5 mg via ORAL
  Filled 2024-05-19 (×2): qty 1

## 2024-05-19 MED ORDER — METOPROLOL TARTRATE 5 MG/5ML IV SOLN: 5.0000 mg | INTRAVENOUS | Status: DC | PRN

## 2024-05-19 NOTE — Progress Notes (Signed)
 PROGRESS NOTE    Summer Christensen  FMW:[id number] DOB: April 22, 1951 DOA: 05/17/2024 PCP: [facility]    Brief Narrative:  73 y.o. female with medical history significant of HTN, LBBB, NICM with LVEF of 25-30% s/p CRT-ICD with LBBAP on 7/25, RLE DVT and LUL PE in 07/2022 on Eliquis  who is admitted with presyncope.    Assessment & Plan:  Principal Problem:   Postural dizziness with presyncope Active Problems:   Lightheadedness   Weakness   ICD (implantable cardioverter-defibrillator) in place    Presyncope H/o NICM with LVEF of 25-30% s/p CRT-ICD with LBBAP -Seen by cardiology team, no further concerns per their service.  Routine recovery is expected.   HFrEF exacerbation Elevated BNP Continue home medications.  Seen by cardiology team.  Leukocytosis - Looking back this appears to be chronic.  Blood cultures remain negative.  Will continue to monitor for any signs of infection - CRP and procalcitonin remains negative   H/o RLE DVT and LUL PE in 07/2022  Continue Eliquis      DVT prophylaxis: Eliquis     Code Status: Full Code Family Communication:   Ongoing blood culture monitoring, persistent leukocytosis.  Hopefully discharge tomorrow    Subjective: Doing well no complaints wants to go home   Examination:  General exam: Appears calm and comfortable  Respiratory system: Clear to auscultation. Respiratory effort normal. Cardiovascular system: S1 & S2 heard, RRR. No JVD, murmurs, rubs, gallops or clicks. No pedal edema. Gastrointestinal system: Abdomen is nondistended, soft and nontender. No organomegaly or masses felt. Normal bowel sounds heard. Central nervous system: Alert and oriented. No focal neurological deficits. Extremities: Symmetric 5 x 5 power. Skin: No rashes, lesions or ulcers.  Pacemaker insertion site looks okay Psychiatry: Judgement and insight appear normal. Mood & affect appropriate.                Diet Orders  (From admission, onward)     Start     Ordered   05/17/24 2354  Diet regular Room service appropriate? Yes; Fluid consistency: Thin  Diet effective now       Question Answer Comment  Room service appropriate? Yes   Fluid consistency: Thin      05/17/24 2353            Objective: Vitals:   05/18/24 2233 05/18/24 2358 05/19/24 0503 05/19/24 0750  BP: (!) 102/58 (!) 102/58 101/66 108/80  Pulse: 69 72 76 76  Resp:  17 18 17   Temp:  98.4 F (36.9 C) 98.4 F (36.9 C) 98.5 F (36.9 C)  TempSrc:  Oral Oral Oral  SpO2:  96% 94% 98%  Weight:   64.8 kg   Height:        Intake/Output Summary (Last 24 hours) at 05/19/2024 1142 Last data filed at 05/19/2024 0903 Gross per 24 hour  Intake 600 ml  Output 800 ml  Net -200 ml   Filed Weights   05/17/24 2125 05/18/24 1539 05/19/24 0503  Weight: 65.3 kg 65.1 kg 64.8 kg    Scheduled Meds:  apixaban   5 mg Oral BID   carvedilol   12.5 mg Oral BID   digoxin   0.125 mg Oral Daily   furosemide   40 mg Intravenous BID   insulin  aspart  0-15 Units Subcutaneous TID WC   insulin  aspart  0-5 Units Subcutaneous QHS   linagliptin   5 mg Oral Daily   sacubitril -valsartan   1 tablet Oral BID   Continuous Infusions:  Nutritional status  Body mass index is 24.53 kg/m.  Data Reviewed:   CBC: Recent Labs  Lab 05/17/24 2129 05/18/24 0130 05/19/24 0942  WBC 14.4*  14.5* 14.1* 14.4*  NEUTROABS 11.5* 11.8*  --   HGB 11.2*  11.1* 11.2* 13.1  HCT 32.9*  33.3* 32.6* 38.0  MCV 85.0  85.2 84.0 84.1  PLT 170  175 164 196   Basic Metabolic Panel: Recent Labs  Lab 05/17/24 2129 05/18/24 0130  NA 137 138  K 4.1 4.1  CL 106 106  CO2 22 22  GLUCOSE 193* 166*  BUN 18 15  CREATININE 1.06* 1.05*  CALCIUM  9.3 9.6  MG  --  2.1   GFR: Estimated Creatinine Clearance: 41.8 mL/min (A) (by C-G formula based on SCr of 1.05 mg/dL (H)). Liver Function Tests: Recent Labs  Lab 05/17/24 2129 05/18/24 0130  AST 20 21  ALT 19 20   ALKPHOS 56 61  BILITOT 0.4 0.6  PROT 6.5 6.3*  ALBUMIN 3.5 3.4*   No results for input(s): LIPASE, AMYLASE in the last 168 hours. No results for input(s): AMMONIA in the last 168 hours. Coagulation Profile: No results for input(s): INR, PROTIME in the last 168 hours. Cardiac Enzymes: No results for input(s): CKTOTAL, CKMB, CKMBINDEX, TROPONINI in the last 168 hours. BNP (last 3 results) No results for input(s): PROBNP in the last 8760 hours. HbA1C: Recent Labs    05/18/24 1647  HGBA1C 6.9*   CBG: Recent Labs  Lab 05/18/24 0420 05/18/24 1136 05/18/24 1717 05/18/24 2121 05/19/24 0609  GLUCAP 152* 133* 137* 182* 226*   Lipid Profile: No results for input(s): CHOL, HDL, LDLCALC, TRIG, CHOLHDL, LDLDIRECT in the last 72 hours. Thyroid  Function Tests: No results for input(s): TSH, T4TOTAL, FREET4, T3FREE, THYROIDAB in the last 72 hours. Anemia Panel: No results for input(s): VITAMINB12, FOLATE, FERRITIN, TIBC, IRON, RETICCTPCT in the last 72 hours. Sepsis Labs: Recent Labs  Lab 05/17/24 2129  PROCALCITON <0.10    Recent Results (from the past 240 hours)  Blood culture (routine x 2)     Status: None (Preliminary result)   Collection Time: 05/18/24  1:30 AM   Specimen: BLOOD LEFT HAND  Result Value Ref Range Status   Specimen Description BLOOD LEFT HAND  Final   Special Requests   Final    BOTTLES DRAWN AEROBIC AND ANAEROBIC Blood Culture adequate volume   Culture   Final    NO GROWTH < 12 HOURS Performed at [facility], [address], [place], [place] [address]    Report Status PENDING  Incomplete         Radiology Studies: CT HEAD WO CONTRAST Result Date: 05/17/2024 CLINICAL DATA:  Headaches EXAM: CT HEAD WITHOUT CONTRAST TECHNIQUE: Contiguous axial images were obtained from the base of the skull through the vertex without intravenous contrast. RADIATION DOSE REDUCTION: This exam was performed  according to the departmental dose-optimization program which includes automated exposure control, adjustment of the mA and/or kV according to patient size and/or use of iterative reconstruction technique. COMPARISON:  None Available. FINDINGS: Brain: No evidence of acute infarction, hemorrhage, hydrocephalus, extra-axial collection or mass lesion/mass effect. Vascular: No hyperdense vessel or unexpected calcification. Skull: Normal. Negative for fracture or focal lesion. Sinuses/Orbits: No acute finding. Other: None. IMPRESSION: No acute intracranial abnormality noted. Electronically Signed   By: Oneil Devonshire M.D.   On: 05/17/2024 22:52   DG Chest Port 1 View Result Date: 05/17/2024 CLINICAL DATA:  Syncopal episode EXAM: PORTABLE CHEST 1 VIEW  COMPARISON:  05/13/2024 FINDINGS: Cardiac shadow is stable. Defibrillator is again noted. Lungs are well aerated bilaterally infiltrate or sizable effusion is seen. Some minimal scarring in the left base is noted. IMPRESSION: No active disease. Electronically Signed   By: Oneil Devonshire M.D.   On: 05/17/2024 22:39           LOS: 0 days   Time spent= 35 mins    Burgess JAYSON Dare, MD [facility]  If 7PM-7AM, please contact night-coverage  05/19/2024, 11:42 AM

## 2024-05-19 NOTE — Hospital Course (Addendum)
 Brief Narrative:  73 y.o. female with medical history significant of HTN, LBBB, NICM with LVEF of 25-30% s/p CRT-ICD with LBBAP on 7/25, RLE DVT and LUL PE in 07/2022 on Eliquis  who is admitted with presyncope.    Assessment & Plan:  Principal Problem:   Postural dizziness with presyncope Active Problems:   Lightheadedness   Weakness   ICD (implantable cardioverter-defibrillator) in place    Presyncope H/o NICM with LVEF of 25-30% s/p CRT-ICD with LBBAP -Seen by cardiology team, no further concerns per their service.  Routine recovery is expected.   HFrEF exacerbation Elevated BNP Continue home medications.  Seen by cardiology team.  Leukocytosis - Looking back this appears to be chronic.  Blood cultures remain negative.  Will continue to monitor for any signs of infection - CRP and procalcitonin remains negative   H/o RLE DVT and LUL PE in 07/2022  Continue Eliquis      DVT prophylaxis: Eliquis     Code Status: Full Code Family Communication:   Ongoing blood culture monitoring, persistent leukocytosis.  Hopefully discharge tomorrow    Subjective: Doing well no complaints wants to go home   Examination:  General exam: Appears calm and comfortable  Respiratory system: Clear to auscultation. Respiratory effort normal. Cardiovascular system: S1 & S2 heard, RRR. No JVD, murmurs, rubs, gallops or clicks. No pedal edema. Gastrointestinal system: Abdomen is nondistended, soft and nontender. No organomegaly or masses felt. Normal bowel sounds heard. Central nervous system: Alert and oriented. No focal neurological deficits. Extremities: Symmetric 5 x 5 power. Skin: No rashes, lesions or ulcers.  Pacemaker insertion site looks okay Psychiatry: Judgement and insight appear normal. Mood & affect appropriate.

## 2024-05-19 NOTE — Progress Notes (Signed)
 Mobility Specialist Progress Note:    05/19/24 1046  Mobility  Activity Ambulated with assistance  Level of Assistance Standby assist, set-up cues, supervision of patient - no hands on  Assistive Device None  Distance Ambulated (ft) 600 ft  Activity Response Tolerated well  Mobility Referral Yes  Mobility visit 1 Mobility  Mobility Specialist Start Time (ACUTE ONLY) 1046  Mobility Specialist Stop Time (ACUTE ONLY) 1115  Mobility Specialist Time Calculation (min) (ACUTE ONLY) 29 min   Pt pleasant and agreeable to session. Received pt standing at sink washing up. No c/o any symptoms. Returned pt to recliner w/all needs met.   Venetia Keel Mobility Specialist Please Neurosurgeon or Rehab Office at [phone]

## 2024-05-19 NOTE — Plan of Care (Signed)
   Problem: Education: Goal: Ability to describe self-care measures that may prevent or decrease complications (Diabetes Survival Skills Education) will improve Outcome: Progressing

## 2024-05-20 DIAGNOSIS — R55 Syncope and collapse: Secondary | ICD-10-CM | POA: Diagnosis not present

## 2024-05-20 DIAGNOSIS — R42 Dizziness and giddiness: Secondary | ICD-10-CM | POA: Diagnosis not present

## 2024-05-20 LAB — BASIC METABOLIC PANEL WITH GFR
Anion gap: 9 (ref 5–15)
BUN: 25 mg/dL — ABNORMAL HIGH (ref 8–23)
CO2: 25 mmol/L (ref 22–32)
Calcium: 9.4 mg/dL (ref 8.9–10.3)
Chloride: 106 mmol/L (ref 98–111)
Creatinine, Ser: 1.24 mg/dL — ABNORMAL HIGH (ref 0.44–1.00)
GFR, Estimated: 46 mL/min — ABNORMAL LOW (ref >60)
Glucose, Bld: 152 mg/dL — ABNORMAL HIGH (ref 70–99)
Potassium: 3.9 mmol/L (ref 3.5–5.1)
Sodium: 140 mmol/L (ref 135–145)

## 2024-05-20 LAB — CBC
HCT: 35.9 % — ABNORMAL LOW (ref 36.0–46.0)
Hemoglobin: 12.2 g/dL (ref 12.0–15.0)
MCH: 28.6 pg (ref 26.0–34.0)
MCHC: 34 g/dL (ref 30.0–36.0)
MCV: 84.3 fL (ref 80.0–100.0)
Platelets: 161 K/uL (ref 150–400)
RBC: 4.26 MIL/uL (ref 3.87–5.11)
RDW: 12.6 % (ref 11.5–15.5)
WBC: 13.4 K/uL — ABNORMAL HIGH (ref 4.0–10.5)
nRBC: 0 % (ref 0.0–0.2)

## 2024-05-20 LAB — GLUCOSE, CAPILLARY: Glucose-Capillary: 154 mg/dL — ABNORMAL HIGH (ref 70–99)

## 2024-05-20 LAB — MAGNESIUM: Magnesium: 1.9 mg/dL (ref 1.7–2.4)

## 2024-05-20 MED ORDER — DIGOXIN 125 MCG PO TABS: 0.1250 mg | ORAL_TABLET | Freq: Every day | ORAL | Status: AC

## 2024-05-20 NOTE — TOC Transition Note (Signed)
 Transition of Care [facility]) - Discharge Note   Patient Details  Name: Summer Christensen MRN: [id number] Date of Birth: September 17, 1951  [facility] CM/SW Contact:  Waddell Barnie Rama, RN Phone Number: 05/20/2024, 9:43 AM   Clinical Narrative:    For dc, has no needs.          Patient Goals and CMS Choice            Discharge Placement                       Discharge Plan and Services Additional resources added to the After Visit Summary for                                       Social Drivers of Health (SDOH) Interventions SDOH Screenings   Food Insecurity: No Food Insecurity (05/19/2024)  Housing: Low Risk  (05/19/2024)  Transportation Needs: No Transportation Needs (05/19/2024)  Utilities: Not At Risk (05/19/2024)  Depression (PHQ2-9): Low Risk  (05/13/2023)  Social Connections: Moderately Integrated (05/19/2024)  Tobacco Use: Low Risk  (05/17/2024)     Readmission Risk Interventions    08/04/2022   10:51 AM  Readmission Risk Prevention Plan  Post Dischage Appt Complete  Medication Screening Complete  Transportation Screening Complete

## 2024-05-20 NOTE — Plan of Care (Signed)
  Problem: Education: Goal: Ability to describe self-care measures that may prevent or decrease complications (Diabetes Survival Skills Education) will improve Outcome: Progressing   Problem: Coping: Goal: Ability to adjust to condition or change in health will improve Outcome: Progressing   Problem: Education: Goal: Knowledge of General Education information will improve Description: Including pain rating scale, medication(s)/side effects and non-pharmacologic comfort measures Outcome: Progressing   

## 2024-05-20 NOTE — Plan of Care (Signed)
   Problem: Coping: Goal: Ability to adjust to condition or change in health will improve Outcome: Progressing

## 2024-05-20 NOTE — Progress Notes (Signed)
 Patient alert and oriented, verbalized understanding of dc instructions. Piv dcd site unremarkable. Denies chest pain or sob. All belongings given to patient.

## 2024-05-20 NOTE — Discharge Summary (Signed)
 Physician Discharge Summary  Summer Christensen FMW:[id number] DOB: 05-09-1951 DOA: 05/17/2024  PCP: [facility]  Admit date: 05/17/2024 Discharge date: 05/20/2024  Admitted From: Home Disposition: Home  Recommendations for Outpatient Follow-up:  Follow up with PCP in 1-2 weeks Please obtain BMP/CBC in one week your next doctors visit.  Recommend outpatient follow-up with PCP regarding chronic leukocytosis since 2023.  Possibly consider bone marrow biopsy  Discharge Condition: Stable CODE STATUS: Full code Diet recommendation: Cardiac  Brief/Interim Summary: Brief Narrative:  73 y.o. female with medical history significant of HTN, LBBB, NICM with LVEF of 25-30% s/p CRT-ICD with LBBAP on 7/25, RLE DVT and LUL PE in 07/2022 on Eliquis  who is admitted with presyncope.  During the hospitalization patient was evaluated for any signs of infection and leukocytosis.  This remained stable, cultures remain negative.  Today patient is medically stable for discharge.   Assessment & Plan:  Principal Problem:   Postural dizziness with presyncope Active Problems:   Lightheadedness   Weakness   ICD (implantable cardioverter-defibrillator) in place    Presyncope H/o NICM with LVEF of 25-30% s/p CRT-ICD with LBBAP -Seen by cardiology team, no further concerns per their service.  Routine recovery is expected.   HFrEF exacerbation Elevated BNP Continue home medications.  Seen by cardiology team.  Leukocytosis - Looking back this appears to be chronic.  Blood cultures remain negative.  Will continue to monitor for any signs of infection.  Recommend patient follow-up outpatient with primary care physician and possibly consider bone marrow biopsy. - CRP and procalcitonin remains negative   H/o RLE DVT and LUL PE in 07/2022  Continue Eliquis      DVT prophylaxis: Eliquis     Code Status: Full Code Family Communication:   Discharge today   Subjective: No complaints  wants to go home  Examination:  General exam: Appears calm and comfortable  Respiratory system: Clear to auscultation. Respiratory effort normal. Cardiovascular system: S1 & S2 heard, RRR. No JVD, murmurs, rubs, gallops or clicks. No pedal edema. Gastrointestinal system: Abdomen is nondistended, soft and nontender. No organomegaly or masses felt. Normal bowel sounds heard. Central nervous system: Alert and oriented. No focal neurological deficits. Extremities: Symmetric 5 x 5 power. Skin: No rashes, lesions or ulcers.  Pacemaker insertion site looks okay Psychiatry: Judgement and insight appear normal. Mood & affect appropriate. Pacemaker site no evidence of infection   Discharge Diagnoses:  Principal Problem:   Postural dizziness with presyncope Active Problems:   Lightheadedness   Weakness   ICD (implantable cardioverter-defibrillator) in place      Discharge Exam: Vitals:   05/20/24 0409 05/20/24 0725  BP: 113/67 114/72  Pulse: 82 99  Resp: 17 20  Temp: 98.6 F (37 C) 98.6 F (37 C)  SpO2: 92% 96%   Vitals:   05/19/24 2200 05/20/24 0004 05/20/24 0409 05/20/24 0725  BP: 117/68 117/62 113/67 114/72  Pulse: 82 91 82 99  Resp:  18 17 20   Temp: 98.6 F (37 C) 99 F (37.2 C) 98.6 F (37 C) 98.6 F (37 C)  TempSrc: Oral Oral Oral Oral  SpO2: 96% 94% 92% 96%  Weight:   65.4 kg   Height:          Discharge Instructions   Allergies as of 05/20/2024       Reactions   Jardiance  [empagliflozin ] Itching, Swelling   Glucophage  [metformin ] Itching, Dermatitis, Rash        Medication List     TAKE  these medications    ADVIL PM PO Take 1 tablet by mouth at bedtime as needed (pain, sleep).   apixaban  5 MG Tabs tablet Commonly known as: Eliquis  Take 1 tablet (5 mg total) by mouth 2 (two) times daily.   carvedilol  12.5 MG tablet Commonly known as: COREG  TAKE ONE TABLET BY MOUTH TWICE DAILY   digoxin  0.125 MG tablet Commonly known as: LANOXIN  Take 1  tablet (0.125 mg total) by mouth daily.   Entresto  97-103 MG Generic drug: sacubitril -valsartan  Take 1 tablet by mouth 2 (two) times daily.   furosemide  40 MG tablet Commonly known as: LASIX  TAKE 40MG  DAILY, MAY TAKE ADDITIONAL DOSE OF LASIX  40MG  AS NEEDED FOR SWELLING What changed:  how much to take how to take this when to take this additional instructions   IRON PO Take 1 tablet by mouth daily.   sitaGLIPtin 50 MG tablet Commonly known as: JANUVIA Take 50 mg by mouth in the morning.   Womens 50+ Multi Vitamin Tabs Take 1 tablet by mouth daily.        Follow-up Information     [facility] Follow up.   Why: Please follow up in a week. Contact information: [address] [place] [address] [phone]                Allergies  Allergen Reactions   Jardiance  [Empagliflozin ] Itching and Swelling   Glucophage  [Metformin ] Itching, Dermatitis and Rash    You were cared for by a hospitalist during your hospital stay. If you have any questions about your discharge medications or the care you received while you were in the hospital after you are discharged, you can call the unit and asked to speak with the hospitalist on call if the hospitalist that took care of you is not available. Once you are discharged, your primary care physician will handle any further medical issues. Please note that no refills for any discharge medications will be authorized once you are discharged, as it is imperative that you return to your primary care physician (or establish a relationship with a primary care physician if you do not have one) for your aftercare needs so that they can reassess your need for medications and monitor your lab values.  You were cared for by a hospitalist during your hospital stay. If you have any questions about your discharge medications or the care you received while you were in the hospital after you are discharged, you can call the  unit and asked to speak with the hospitalist on call if the hospitalist that took care of you is not available. Once you are discharged, your primary care physician will handle any further medical issues. Please note that NO REFILLS for any discharge medications will be authorized once you are discharged, as it is imperative that you return to your primary care physician (or establish a relationship with a primary care physician if you do not have one) for your aftercare needs so that they can reassess your need for medications and monitor your lab values.  Please request your Prim.MD to go over all Hospital Tests and Procedure/Radiological results at the follow up, please get all Hospital records sent to your Prim MD by signing hospital release before you go home.  Get CBC, CMP, 2 view Chest X ray checked  by Primary MD during your next visit or SNF MD in 5-7 days ( we routinely change or add medications that can affect your baseline labs and  fluid status, therefore we recommend that you get the mentioned basic workup next visit with your PCP, your PCP may decide not to get them or add new tests based on their clinical decision)  On your next visit with your primary care physician please Get Medicines reviewed and adjusted.  If you experience worsening of your admission symptoms, develop shortness of breath, life threatening emergency, suicidal or homicidal thoughts you must seek medical attention immediately by calling 911 or calling your MD immediately  if symptoms less severe.  You Must read complete instructions/literature along with all the possible adverse reactions/side effects for all the Medicines you take and that have been prescribed to you. Take any new Medicines after you have completely understood and accpet all the possible adverse reactions/side effects.   Do not drive, operate heavy machinery, perform activities at heights, swimming or participation in water activities or provide baby  sitting services if your were admitted for syncope or siezures until you have seen by Primary MD or a Neurologist and advised to do so again.  Do not drive when taking Pain medications.   Procedures/Studies: CT HEAD WO CONTRAST Result Date: 05/17/2024 CLINICAL DATA:  Headaches EXAM: CT HEAD WITHOUT CONTRAST TECHNIQUE: Contiguous axial images were obtained from the base of the skull through the vertex without intravenous contrast. RADIATION DOSE REDUCTION: This exam was performed according to the departmental dose-optimization program which includes automated exposure control, adjustment of the mA and/or kV according to patient size and/or use of iterative reconstruction technique. COMPARISON:  None Available. FINDINGS: Brain: No evidence of acute infarction, hemorrhage, hydrocephalus, extra-axial collection or mass lesion/mass effect. Vascular: No hyperdense vessel or unexpected calcification. Skull: Normal. Negative for fracture or focal lesion. Sinuses/Orbits: No acute finding. Other: None. IMPRESSION: No acute intracranial abnormality noted. Electronically Signed   By: Oneil Devonshire M.D.   On: 05/17/2024 22:52   DG Chest Port 1 View Result Date: 05/17/2024 CLINICAL DATA:  Syncopal episode EXAM: PORTABLE CHEST 1 VIEW COMPARISON:  05/13/2024 FINDINGS: Cardiac shadow is stable. Defibrillator is again noted. Lungs are well aerated bilaterally infiltrate or sizable effusion is seen. Some minimal scarring in the left base is noted. IMPRESSION: No active disease. Electronically Signed   By: Oneil Devonshire M.D.   On: 05/17/2024 22:39   DG Chest 2 View Result Date: 05/13/2024 CLINICAL DATA:  Cardiac device in-situ EXAM: CHEST - 2 VIEW COMPARISON:  Chest x-ray May 18, 2023 FINDINGS: The heart size and mediastinal contours are within normal limits. Both lungs are clear. Linear scarring/atelectasis in left lung base is stable to prior. The visualized skeletal structures are unremarkable. Left chest wall ICD in  place. Right upper quadrant cholecystectomy clips. IMPRESSION: No active cardiopulmonary disease. Biventricular ICD. Electronically Signed   By: Megan  Zare M.D.   On: 05/13/2024 18:46   EP PPM/ICD IMPLANT Result Date: 05/13/2024 Table formatting from the original result was not included.  SURGEON:  Eulas Furbish, MD    PREPROCEDURE DIAGNOSES:  1. CHFrEF  2. LBBB    POSTPROCEDURE DIAGNOSES:  1. Unchanged from preprocedure    PROCEDURES:   1. Placement of a CRT-ICD with LBB pacing lead  2. Coronary sinus venogram    INTRODUCTION: ETOLA MULL is a 73 y.o. patient who presents to the EP lab for BiV ICD implantation.    DESCRIPTION OF PROCEDURE:  Informed written consent was obtained and the patient was brought to the electrophysiology lab in the fasting state. The patient  was adequately sedated with intravenous  Versed , and fentanyl  as outlined in the nursing report.  The patient's left chest was prepped and draped in the usual sterile fashion by the EP lab staff.  The skin overlying the left deltopectoral region was infiltrated with lidocaine  for local analgesia.  An incision was created over the left deltopectoral region.  A left subcutaneous pocket was fashioned using a combination of sharp and blunt dissection immediately superficial to the pectoral fascia.  Electrocautery was used to assure hemostasis. Lead Placement: The left axillary vein was cannulated using modified seldinger techniques.  Through the left axillary vein, an RV ICD lead was advanced to the RVapex under fluoroscopic guidance. Lead parameters are detailed below. The lead was secured to the pectoral fascia with two loops of 0-Ethibond. Next, the coronary sinus was cannulated with a wholey wire via a coronary sinus guide and the guide advanced into the coronary sinus. A CS venogram was obtained with approximately 10 ccs of contrast using a balloon-tipped wire. There was a very diminutive and tortuous lateral branch that was not a feasible  candidate. There was also a very large inferolateral vein. The vein was large through its entire course to the apex, and I suspect it supplies the majority of drainage for the inferior aspect of the heart rather than a middle cardiac vein. I did not think this branch was an excellent option because the lead would have to have been placed very distally, at the apex, to wedge appropriately. I instead opted to place a LBB lead. I then exchanged the CS guide for a LBB guide and mapped the RV septum. I located a good W signal at the mid septum and advanced the lead, resulting in a good r' and LVAT of 80 msec. Prior to this, I did test a few other areas and the best LVAT achieved was 96 msec. Next, the right atrial lead was advanced to the RA appendage. It was secured to the pectoral fascia with two loops of 0-Ethibond.  Lead parameters are detailed below. Finally, the coronary sinus guide was split and the lead secured to the pectoral fascia with two loops of 0-Ethibond.  RA RV LBB Model 1231 7122 1231 Serial # [id number] Amplitude 3.6 mV > 12 mV NA mV Threshold 0.75 V @ 0.5 ms 0.5 V @ 0.5 ms 1.25 V @ 0.5 ms Impedence 400 ohms 560 ohms 680 ohms The pocket was irrigated with copious gentamicin  solution.  The leads were then  connected to a pulse generator (model [place], serial [id number]). The pocket was closed in layers of absorbable suture. EBL < 10mL. Steri-strips and a sterile dressing were applied.  During this procedure the patient is administered Versed  and Fentanyl  by a trained provider under my direct supervision to achieve and maintain moderate conscious sedation.  The patient's heart rate, blood pressure, and oxygen saturation are monitored continuously during the procedure. The period of conscious sedation is 83 minutes, of which I was present face-to-face 100% of this time.    CONCLUSIONS:  1. Successful implantation of an ICD with LBB pacing  3.  No early apparent complications.  Eulas Furbish, MD Cardiac Electrophysiology     The results of significant diagnostics from this hospitalization (including imaging, microbiology, ancillary and laboratory) are listed below for reference.     Microbiology: Recent Results (from the past 240 hours)  Blood culture (routine x 2)     Status: None (Preliminary result)   Collection Time: 05/18/24  1:30 AM   Specimen: BLOOD  LEFT HAND  Result Value Ref Range Status   Specimen Description BLOOD LEFT HAND  Final   Special Requests   Final    BOTTLES DRAWN AEROBIC AND ANAEROBIC Blood Culture adequate volume   Culture   Final    NO GROWTH 2 DAYS Performed at [facility], [address], [place], [place] [address]    Report Status PENDING  Incomplete  Blood culture (routine x 2)     Status: None (Preliminary result)   Collection Time: 05/18/24  4:47 PM   Specimen: BLOOD LEFT ARM  Result Value Ref Range Status   Specimen Description BLOOD LEFT ARM  Final   Special Requests   Final    BOTTLES DRAWN AEROBIC ONLY Blood Culture results may not be optimal due to an inadequate volume of blood received in culture bottles   Culture   Final    NO GROWTH 2 DAYS Performed at [facility], [address] [place], [place] [address]    Report Status PENDING  Incomplete     Labs: BNP (last 3 results) Recent Labs    05/29/23 1006 05/17/24 2129  BNP 97.5 440.4*   Basic Metabolic Panel: Recent Labs  Lab 05/17/24 2129 05/18/24 0130 05/19/24 0942 05/20/24 0231  NA 137 138 135 140  K 4.1 4.1 3.8 3.9  CL 106 106 100 106  CO2 22 22 26 25   GLUCOSE 193* 166* 330* 152*  BUN 18 15 24* 25*  CREATININE 1.06* 1.05* 1.52* 1.24*  CALCIUM  9.3 9.6 9.6 9.4  MG  --  2.1  --  1.9   Liver Function Tests: Recent Labs  Lab 05/17/24 2129 05/18/24 0130  AST 20 21  ALT 19 20  ALKPHOS 56 61  BILITOT 0.4 0.6  PROT 6.5 6.3*  ALBUMIN 3.5 3.4*   No results for input(s): LIPASE, AMYLASE in the last 168 hours. No results  for input(s): AMMONIA in the last 168 hours. CBC: Recent Labs  Lab 05/17/24 2129 05/18/24 0130 05/19/24 0942 05/20/24 0231  WBC 14.4*  14.5* 14.1* 14.4* 13.4*  NEUTROABS 11.5* 11.8*  --   --   HGB 11.2*  11.1* 11.2* 13.1 12.2  HCT 32.9*  33.3* 32.6* 38.0 35.9*  MCV 85.0  85.2 84.0 84.1 84.3  PLT 170  175 164 196 161   Cardiac Enzymes: No results for input(s): CKTOTAL, CKMB, CKMBINDEX, TROPONINI in the last 168 hours. BNP: Invalid input(s): POCBNP CBG: Recent Labs  Lab 05/19/24 0609 05/19/24 1204 05/19/24 1540 05/19/24 2059 05/20/24 0610  GLUCAP 226* 194* 165* 126* 154*   D-Dimer No results for input(s): DDIMER in the last 72 hours. Hgb A1c Recent Labs    05/18/24 1647  HGBA1C 6.9*   Lipid Profile No results for input(s): CHOL, HDL, LDLCALC, TRIG, CHOLHDL, LDLDIRECT in the last 72 hours. Thyroid  function studies No results for input(s): TSH, T4TOTAL, T3FREE, THYROIDAB in the last 72 hours.  Invalid input(s): FREET3 Anemia work up No results for input(s): VITAMINB12, FOLATE, FERRITIN, TIBC, IRON, RETICCTPCT in the last 72 hours. Urinalysis    Component Value Date/Time   COLORURINE YELLOW 05/18/2024 0715   APPEARANCEUR CLEAR 05/18/2024 0715   APPEARANCEUR Clear 11/20/2017 1200   LABSPEC 1.010 05/18/2024 0715   PHURINE 6.0 05/18/2024 0715   GLUCOSEU NEGATIVE 05/18/2024 0715   HGBUR NEGATIVE 05/18/2024 0715   BILIRUBINUR NEGATIVE 05/18/2024 0715   BILIRUBINUR Negative 11/20/2017 1200   KETONESUR NEGATIVE 05/18/2024 0715   PROTEINUR NEGATIVE 05/18/2024 0715  UROBILINOGEN 0.2 11/14/2010 1850   NITRITE NEGATIVE 05/18/2024 0715   LEUKOCYTESUR NEGATIVE 05/18/2024 0715   Sepsis Labs Recent Labs  Lab 05/17/24 2129 05/18/24 0130 05/19/24 0942 05/20/24 0231  WBC 14.4*  14.5* 14.1* 14.4* 13.4*   Microbiology Recent Results (from the past 240 hours)  Blood culture (routine x 2)     Status: None  (Preliminary result)   Collection Time: 05/18/24  1:30 AM   Specimen: BLOOD LEFT HAND  Result Value Ref Range Status   Specimen Description BLOOD LEFT HAND  Final   Special Requests   Final    BOTTLES DRAWN AEROBIC AND ANAEROBIC Blood Culture adequate volume   Culture   Final    NO GROWTH 2 DAYS Performed at [facility], [address], [place], [place] [address]    Report Status PENDING  Incomplete  Blood culture (routine x 2)     Status: None (Preliminary result)   Collection Time: 05/18/24  4:47 PM   Specimen: BLOOD LEFT ARM  Result Value Ref Range Status   Specimen Description BLOOD LEFT ARM  Final   Special Requests   Final    BOTTLES DRAWN AEROBIC ONLY Blood Culture results may not be optimal due to an inadequate volume of blood received in culture bottles   Culture   Final    NO GROWTH 2 DAYS Performed at [facility], [address], [place], [place] [address]    Report Status PENDING  Incomplete     Time coordinating discharge:  I have spent 35 minutes face to face with the patient and on the ward discussing the patients care, assessment, plan and disposition with other care givers. >50% of the time was devoted counseling the patient about the risks and benefits of treatment/Discharge disposition and coordinating care.   SIGNED:   Burgess JAYSON Dare, MD  [facility] 05/20/2024, 10:48 AM   If 7PM-7AM, please contact night-coverage

## 2024-05-23 LAB — CULTURE, BLOOD (ROUTINE X 2)
Culture: NO GROWTH
Culture: NO GROWTH
Special Requests: ADEQUATE

## 2024-05-26 ENCOUNTER — Ambulatory Visit: Attending: Cardiology | Admitting: *Deleted

## 2024-05-26 DIAGNOSIS — I447 Left bundle-branch block, unspecified: Secondary | ICD-10-CM | POA: Diagnosis present

## 2024-05-26 NOTE — Patient Instructions (Signed)

## 2024-05-26 NOTE — Progress Notes (Signed)
 Normal multi-lead CRT-D wound check. Wound well healed. Presenting rhythm: AS/BiVP. Routine testing performed. Thresholds, sensing, and impedances consistent with implant measurements with 3.5V safety margin/auto capture until 3 month visit. No episodes. No treated arrhythmias. Reviewed arm restrictions to continue for 6 weeks total post op. Reviewed shock plan.  Pt enrolled in remote follow-up.

## 2024-06-09 ENCOUNTER — Other Ambulatory Visit (HOSPITAL_COMMUNITY): Payer: Self-pay | Admitting: Internal Medicine

## 2024-06-26 NOTE — Progress Notes (Unsigned)
 [facility] Telephone:([phone]) [phone]   Fax:([phone]) [phone]  INITIAL CONSULT NOTE  Patient Care Team: [facility] as PCP - General Mallipeddi, Diannah SQUIBB, MD as PCP - Cardiology (Cardiology) Mealor, Eulas BRAVO, MD as PCP - Electrophysiology (Cardiology)  CHIEF COMPLAINTS/PURPOSE OF CONSULTATION:  Leukocytosis  HISTORY OF PRESENTING ILLNESS:  Summer Christensen 72 y.o. female with medical history significant for ***  On review of the previous records, there is evidence of leukocytosis as far back as January 2012. More recently starting July 2025 during recent hospitalization for presyncope and weakness, there was evidence of persistent leukocytosis ranging between 13-14 K/uL. Differential notes neutrophilia with ANC 11.5-11.8 K/uL. Blood cultures remained negative and patient denies any signs of infection.   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   Chest pain    CHF (congestive heart failure) (HCC)    Hx of hysterectomy    Hypertension    Migraine     SURGICAL HISTORY: Past Surgical History:  Procedure Laterality Date   ABDOMINAL HYSTERECTOMY     BIV ICD INSERTION CRT-D N/A 05/13/2024   Procedure: BIV ICD INSERTION CRT-D;  Surgeon: Nancey Eulas BRAVO, MD;  [facility];  Service: Cardiovascular;  Laterality: N/A;   COLONOSCOPY N/A 06/11/2016   Procedure: COLONOSCOPY;  Surgeon: Claudis RAYMOND Rivet, MD;  [facility];  Service: Endoscopy;  Laterality: N/A;  2:00 - moved to 1:00 - Ann notified pt   LAPAROSCOPIC CHOLECYSTECTOMY  2005   pain.   LEAD INSERTION N/A 05/13/2024   Procedure: LEAD INSERTION;  Surgeon: Nancey Eulas BRAVO, MD;  Location: [facility] INVASIVE CV LAB;  Service: Cardiovascular;  Laterality: N/A;   ROTATOR CUFF REPAIR  2009   arthroscopic   TUBAL LIGATION     bilateral    SOCIAL HISTORY: Social History   Socioeconomic History    Marital status: Divorced    Spouse name: Not on file   Number of children: 3   Years of education: Not on file   Highest education level: Not on file  Occupational History   Occupation: post office clerk    Employer: [employer]  Tobacco Use   Smoking status: Never    Passive exposure: Never   Smokeless tobacco: Never  Vaping Use   Vaping status: Never Used  Substance and Sexual Activity   Alcohol use: No   Drug use: No   Sexual activity: Never    Birth control/protection: Surgical    Comment: hyst  Other Topics Concern   Not on file  Social History Narrative   Not on file   Social Drivers of Health   Financial Resource Strain: Not on file  Food Insecurity: No Food Insecurity (05/19/2024)   Hunger Vital Sign    Worried About Running Out of Food in the Last Year: Never true    Ran Out of Food in the Last Year: Never true  Transportation Needs: No Transportation Needs (05/19/2024)   PRAPARE - Administrator, Civil Service (Medical): No    Lack of Transportation (Non-Medical): No  Physical Activity: Not on file  Stress: Not on file  Social Connections: Moderately Integrated (05/19/2024)   Social Connection and Isolation Panel    Frequency of Communication with Friends and Family: More than three times a week    Frequency of Social Gatherings with Friends and Family: More than three  times a week    Attends Religious Services: More than 4 times per year    Active Member of Clubs or Organizations: No    Attends Banker Meetings: 1 to 4 times per year    Marital Status: Never married  Intimate Partner Violence: Not At Risk (05/19/2024)   Humiliation, Afraid, Rape, and Kick questionnaire    Fear of Current or Ex-Partner: No    Emotionally Abused: No    Physically Abused: No    Sexually Abused: No    FAMILY HISTORY: Family History  Problem Relation Age of Onset   Stroke Mother    Heart attack Father     ALLERGIES:  is allergic to jardiance   [empagliflozin ] and glucophage  [metformin ].  MEDICATIONS:  Current Outpatient Medications  Medication Sig Dispense Refill   apixaban  (ELIQUIS ) 5 MG TABS tablet Take 1 tablet (5 mg total) by mouth 2 (two) times daily. (Patient not taking: Reported on 05/18/2024)     carvedilol  (COREG ) 12.5 MG tablet TAKE ONE TABLET BY MOUTH TWICE DAILY 180 tablet 2   digoxin  (LANOXIN ) 0.125 MG tablet Take 1 tablet (0.125 mg total) by mouth daily.     ENTRESTO  97-103 MG TAKE ONE TABLET BY MOUTH TWICE DAILY 60 tablet 6   Ferrous Sulfate (IRON PO) Take 1 tablet by mouth daily.     furosemide  (LASIX ) 40 MG tablet TAKE 40MG  DAILY, MAY TAKE ADDITIONAL DOSE OF LASIX  40MG  AS NEEDED FOR SWELLING (Patient taking differently: Take 40 mg by mouth in the morning and at bedtime.) 180 tablet 3   Ibuprofen-diphenhydrAMINE  Cit (ADVIL PM PO) Take 1 tablet by mouth at bedtime as needed (pain, sleep).     Multiple Vitamins-Minerals (WOMENS 50+ MULTI VITAMIN) TABS Take 1 tablet by mouth daily.     sitaGLIPtin (JANUVIA) 50 MG tablet Take 50 mg by mouth in the morning.     No current facility-administered medications for this visit.    REVIEW OF SYSTEMS:   Constitutional: ( - ) fevers, ( - )  chills , ( - ) night sweats Eyes: ( - ) blurriness of vision, ( - ) double vision, ( - ) watery eyes Ears, nose, mouth, throat, and face: ( - ) mucositis, ( - ) sore throat Respiratory: ( - ) cough, ( - ) dyspnea, ( - ) wheezes Cardiovascular: ( - ) palpitation, ( - ) chest discomfort, ( - ) lower extremity swelling Gastrointestinal:  ( - ) nausea, ( - ) heartburn, ( - ) change in bowel habits Skin: ( - ) abnormal skin rashes Lymphatics: ( - ) new lymphadenopathy, ( - ) easy bruising Neurological: ( - ) numbness, ( - ) tingling, ( - ) new weaknesses Behavioral/Psych: ( - ) mood change, ( - ) new changes  All other systems were reviewed with the patient and are negative.  PHYSICAL EXAMINATION: ECOG PERFORMANCE STATUS: {CHL ONC ECOG  PS:[id number]}  There were no vitals filed for this visit. There were no vitals filed for this visit.  GENERAL: well appearing *** in NAD  SKIN: skin color, texture, turgor are normal, no rashes or significant lesions EYES: conjunctiva are pink and non-injected, sclera clear OROPHARYNX: no exudate, no erythema; lips, buccal mucosa, and tongue normal  NECK: supple, non-tender LYMPH:  no palpable lymphadenopathy in the cervical, axillary or supraclavicular lymph nodes.  LUNGS: clear to auscultation and percussion with normal breathing effort HEART: regular rate & rhythm and no murmurs and no lower extremity edema ABDOMEN: soft, non-tender, non-distended,  normal bowel sounds Musculoskeletal: no cyanosis of digits and no clubbing  PSYCH: alert & oriented x 3, fluent speech NEURO: no focal motor/sensory deficits  LABORATORY DATA:  I have reviewed the data as listed    Latest Ref Rng & Units 05/20/2024    2:31 AM 05/19/2024    9:42 AM 05/18/2024    1:30 AM  CBC  WBC 4.0 - 10.5 K/uL 13.4  14.4  14.1   Hemoglobin 12.0 - 15.0 g/dL 87.7  86.8  88.7   Hematocrit 36.0 - 46.0 % 35.9  38.0  32.6   Platelets 150 - 400 K/uL 161  196  164        Latest Ref Rng & Units 05/20/2024    2:31 AM 05/19/2024    9:42 AM 05/18/2024    1:30 AM  CMP  Glucose 70 - 99 mg/dL 847  669  833   BUN 8 - 23 mg/dL 25  24  15    Creatinine 0.44 - 1.00 mg/dL 8.75  8.47  8.94   Sodium 135 - 145 mmol/L 140  135  138   Potassium 3.5 - 5.1 mmol/L 3.9  3.8  4.1   Chloride 98 - 111 mmol/L 106  100  106   CO2 22 - 32 mmol/L 25  26  22    Calcium  8.9 - 10.3 mg/dL 9.4  9.6  9.6   Total Protein 6.5 - 8.1 g/dL   6.3   Total Bilirubin 0.0 - 1.2 mg/dL   0.6   Alkaline Phos 38 - 126 U/L   61   AST 15 - 41 U/L   21   ALT 0 - 44 U/L   20      PATHOLOGY: ***  BLOOD FILM: *** Review of the peripheral blood smear showed normal appearing white cells with neutrophils that were appropriately lobated and granulated. There was no  predominance of bi-lobed or hyper-segmented neutrophils appreciated. No Dohle bodies were noted. There was no left shifting, immature forms or blasts noted. Lymphocytes remain normal in size without any predominance of large granular lymphocytes. Red cells show no anisopoikilocytosis, macrocytes , microcytes or polychromasia. There were no schistocytes, target cells, echinocytes, acanthocytes, dacrocytes, or stomatocytes.There was no rouleaux formation, nucleated red cells, or intra-cellular inclusions noted. The platelets are normal in size, shape, and color without any clumping evident.  RADIOGRAPHIC STUDIES: I have personally reviewed the radiological images as listed and agreed with the findings in the report. No results found.  ASSESSMENT & PLAN ***  I reviewed possible etiologies including medication induced, smoking, OSA, infectious process, inflammatory process or hematologic disorders/malignancies. Recommend serologic workup to evaluate for underlying cause.   #Leukocytosis, neutrophil predominant --labs to include CBC, CMP and peripheral smear --additional studies to include ESR and CRP  --patient denies any signs of infection including no evidence of fevers, chills, cough or urinary symptoms.  --check MPN and BCR/ABL FISH to evaluate for hematologic disorder/malignancy --RTC in 6 months or sooner if indicated by the above labs.    No orders of the defined types were placed in this encounter.   All questions were answered. The patient knows to call the clinic with any problems, questions or concerns.  I have spent a total of {CHL ONC TIME VISIT - DTPQU:[id number]} minutes of face-to-face and non-face-to-face time, preparing to see the patient, obtaining and/or reviewing separately obtained history, performing a medically appropriate examination, counseling and educating the patient, ordering medications/tests/procedures, referring and communicating with other health care  professionals, documenting clinical information in the electronic health record, independently interpreting results and communicating results to the patient, and care coordination.   Johnston Police, PA-C Department of Hematology/Oncology [facility] at [facility] Phone: [phone]

## 2024-06-27 ENCOUNTER — Inpatient Hospital Stay: Attending: Hematology | Admitting: Physician Assistant

## 2024-06-27 ENCOUNTER — Encounter: Payer: Self-pay | Admitting: Physician Assistant

## 2024-06-27 ENCOUNTER — Inpatient Hospital Stay

## 2024-06-27 VITALS — BP 148/81 | HR 74 | Temp 98.6°F | Resp 18 | Wt 149.3 lb

## 2024-06-27 DIAGNOSIS — Z7901 Long term (current) use of anticoagulants: Secondary | ICD-10-CM | POA: Diagnosis not present

## 2024-06-27 DIAGNOSIS — Z8619 Personal history of other infectious and parasitic diseases: Secondary | ICD-10-CM | POA: Insufficient documentation

## 2024-06-27 DIAGNOSIS — R590 Localized enlarged lymph nodes: Secondary | ICD-10-CM | POA: Insufficient documentation

## 2024-06-27 DIAGNOSIS — I509 Heart failure, unspecified: Secondary | ICD-10-CM | POA: Diagnosis not present

## 2024-06-27 DIAGNOSIS — R591 Generalized enlarged lymph nodes: Secondary | ICD-10-CM | POA: Diagnosis not present

## 2024-06-27 DIAGNOSIS — Z86711 Personal history of pulmonary embolism: Secondary | ICD-10-CM | POA: Diagnosis not present

## 2024-06-27 DIAGNOSIS — Z86718 Personal history of other venous thrombosis and embolism: Secondary | ICD-10-CM | POA: Diagnosis not present

## 2024-06-27 DIAGNOSIS — D72829 Elevated white blood cell count, unspecified: Secondary | ICD-10-CM

## 2024-06-27 LAB — TECHNOLOGIST SMEAR REVIEW
Plt Morphology: NORMAL
WBC MORPHOLOGY: REACTIVE

## 2024-06-27 LAB — CBC WITH DIFFERENTIAL/PLATELET
Abs Immature Granulocytes: 0.03 K/uL (ref 0.00–0.07)
Basophils Absolute: 0 K/uL (ref 0.0–0.1)
Basophils Relative: 0 %
Eosinophils Absolute: 0.1 K/uL (ref 0.0–0.5)
Eosinophils Relative: 1 %
HCT: 35.7 % — ABNORMAL LOW (ref 36.0–46.0)
Hemoglobin: 12.3 g/dL (ref 12.0–15.0)
Immature Granulocytes: 0 %
Lymphocytes Relative: 19 %
Lymphs Abs: 1.8 K/uL (ref 0.7–4.0)
MCH: 29.4 pg (ref 26.0–34.0)
MCHC: 34.5 g/dL (ref 30.0–36.0)
MCV: 85.4 fL (ref 80.0–100.0)
Monocytes Absolute: 0.5 K/uL (ref 0.1–1.0)
Monocytes Relative: 5 %
Neutro Abs: 7.2 K/uL (ref 1.7–7.7)
Neutrophils Relative %: 75 %
Platelets: 211 K/uL (ref 150–400)
RBC: 4.18 MIL/uL (ref 3.87–5.11)
RDW: 12.6 % (ref 11.5–15.5)
WBC: 9.8 K/uL (ref 4.0–10.5)
nRBC: 0 % (ref 0.0–0.2)

## 2024-06-27 LAB — COMPREHENSIVE METABOLIC PANEL WITH GFR
ALT: 21 U/L (ref 0–44)
AST: 20 U/L (ref 15–41)
Albumin: 3.7 g/dL (ref 3.5–5.0)
Alkaline Phosphatase: 73 U/L (ref 38–126)
Anion gap: 8 (ref 5–15)
BUN: 12 mg/dL (ref 8–23)
CO2: 26 mmol/L (ref 22–32)
Calcium: 9.3 mg/dL (ref 8.9–10.3)
Chloride: 103 mmol/L (ref 98–111)
Creatinine, Ser: 1.09 mg/dL — ABNORMAL HIGH (ref 0.44–1.00)
GFR, Estimated: 54 mL/min — ABNORMAL LOW (ref >60)
Glucose, Bld: 266 mg/dL — ABNORMAL HIGH (ref 70–99)
Potassium: 3.5 mmol/L (ref 3.5–5.1)
Sodium: 137 mmol/L (ref 135–145)
Total Bilirubin: 0.6 mg/dL (ref 0.0–1.2)
Total Protein: 7 g/dL (ref 6.5–8.1)

## 2024-06-27 LAB — SEDIMENTATION RATE: Sed Rate: 6 mm/h (ref 0–30)

## 2024-06-27 LAB — C-REACTIVE PROTEIN: CRP: 1.1 mg/dL — ABNORMAL HIGH (ref <1.0)

## 2024-07-01 ENCOUNTER — Encounter

## 2024-07-04 LAB — JAK2 V617F RFX CALR/MPL/E12-15

## 2024-07-04 LAB — CALR +MPL + E12-E15  (REFLEX)

## 2024-07-06 ENCOUNTER — Ambulatory Visit (INDEPENDENT_AMBULATORY_CARE_PROVIDER_SITE_OTHER)

## 2024-07-06 DIAGNOSIS — I447 Left bundle-branch block, unspecified: Secondary | ICD-10-CM | POA: Diagnosis not present

## 2024-07-06 LAB — CUP PACEART REMOTE DEVICE CHECK
Battery Remaining Longevity: 94 mo
Battery Remaining Percentage: 95.5 %
Battery Voltage: 3.08 V
Brady Statistic AP VP Percent: 1 %
Brady Statistic AP VS Percent: 1 %
Brady Statistic AS VP Percent: 99 %
Brady Statistic AS VS Percent: 1 %
Brady Statistic RA Percent Paced: 1 %
Date Time Interrogation Session: 20250917103735
HighPow Impedance: 62 Ohm
Implantable Lead Connection Status: 753985
Implantable Lead Connection Status: 753985
Implantable Lead Connection Status: 753985
Implantable Lead Implant Date: 20250725
Implantable Lead Implant Date: 20250725
Implantable Lead Implant Date: 20250725
Implantable Lead Location: 753858
Implantable Lead Location: 753859
Implantable Lead Location: 753860
Implantable Pulse Generator Implant Date: 20250725
Lead Channel Impedance Value: 350 Ohm
Lead Channel Impedance Value: 430 Ohm
Lead Channel Impedance Value: 450 Ohm
Lead Channel Pacing Threshold Amplitude: 0.5 V
Lead Channel Pacing Threshold Amplitude: 0.625 V
Lead Channel Pacing Threshold Amplitude: 0.625 V
Lead Channel Pacing Threshold Pulse Width: 0.5 ms
Lead Channel Pacing Threshold Pulse Width: 0.5 ms
Lead Channel Pacing Threshold Pulse Width: 0.5 ms
Lead Channel Sensing Intrinsic Amplitude: 12 mV
Lead Channel Sensing Intrinsic Amplitude: 4.7 mV
Lead Channel Setting Pacing Amplitude: 1 V
Lead Channel Setting Pacing Amplitude: 1.625
Lead Channel Setting Pacing Amplitude: 1.625
Lead Channel Setting Pacing Pulse Width: 0.5 ms
Lead Channel Setting Pacing Pulse Width: 0.5 ms
Lead Channel Setting Sensing Sensitivity: 0.5 mV
Pulse Gen Serial Number: 211050435
Zone Setting Status: 755011

## 2024-07-06 LAB — BCR-ABL1 FISH
Cells Analyzed: 200
Cells Counted: 200

## 2024-07-09 ENCOUNTER — Ambulatory Visit: Payer: Self-pay | Admitting: Cardiovascular Disease

## 2024-07-11 ENCOUNTER — Ambulatory Visit (HOSPITAL_COMMUNITY)
Admission: RE | Admit: 2024-07-11 | Discharge: 2024-07-11 | Disposition: A | Discharge status: Home / Self Care | Source: Ambulatory Visit | Attending: Physician Assistant | Admitting: Physician Assistant

## 2024-07-11 DIAGNOSIS — D72829 Elevated white blood cell count, unspecified: Secondary | ICD-10-CM | POA: Diagnosis present

## 2024-07-11 DIAGNOSIS — R591 Generalized enlarged lymph nodes: Secondary | ICD-10-CM | POA: Insufficient documentation

## 2024-07-11 NOTE — Progress Notes (Signed)
Remote ICD Transmission.

## 2024-07-18 ENCOUNTER — Inpatient Hospital Stay (HOSPITAL_BASED_OUTPATIENT_CLINIC_OR_DEPARTMENT_OTHER): Admitting: Physician Assistant

## 2024-07-18 DIAGNOSIS — Z7901 Long term (current) use of anticoagulants: Secondary | ICD-10-CM | POA: Diagnosis not present

## 2024-07-18 DIAGNOSIS — D72829 Elevated white blood cell count, unspecified: Secondary | ICD-10-CM

## 2024-07-18 DIAGNOSIS — R590 Localized enlarged lymph nodes: Secondary | ICD-10-CM | POA: Diagnosis not present

## 2024-07-18 DIAGNOSIS — I509 Heart failure, unspecified: Secondary | ICD-10-CM | POA: Diagnosis not present

## 2024-07-18 DIAGNOSIS — Z86711 Personal history of pulmonary embolism: Secondary | ICD-10-CM

## 2024-07-18 DIAGNOSIS — Z86718 Personal history of other venous thrombosis and embolism: Secondary | ICD-10-CM

## 2024-07-18 DIAGNOSIS — Z8619 Personal history of other infectious and parasitic diseases: Secondary | ICD-10-CM

## 2024-07-18 NOTE — Progress Notes (Signed)
 [facility]  PROGRESS NOTE  Patient Care Team: [facility] as PCP - General Mallipeddi, Diannah SQUIBB, MD as PCP - Cardiology (Cardiology) Mealor, Eulas BRAVO, MD as PCP - Electrophysiology (Cardiology)  CHIEF COMPLAINTS/PURPOSE OF CONSULTATION:  Leukocytosis  HISTORY OF PRESENTING ILLNESS:  Summer Christensen 73 y.o. female returns for a virtual telephone visit to follow-up on leukocytosis.  Summer Christensen reports she denies any changes to her health.  Her energy is fairly stable with ongoing fatigue which does not impact her ADLs.  Her appetite and weight are stable.  She denies nausea, vomiting or bowel habit changes.  She denies easy bruising or signs of active bleeding.  She does have some shortness of breath with exertion but none at rest.  She denies fevers, chills, night sweats, chest pain, cough, headaches or dizziness.  She has no other complaints.  Rest of the 10 point ROS as below.  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   Chest pain    CHF (congestive heart failure) (HCC)    Hx of hysterectomy    Hypertension    Migraine     SURGICAL HISTORY: Past Surgical History:  Procedure Laterality Date   ABDOMINAL HYSTERECTOMY     BIV ICD INSERTION CRT-D N/A 05/13/2024   Procedure: BIV ICD INSERTION CRT-D;  Surgeon: Nancey Eulas BRAVO, MD;  [facility]: [facility];  Service: Cardiovascular;  Laterality: N/A;   COLONOSCOPY N/A 06/11/2016   Procedure: COLONOSCOPY;  Surgeon: Claudis RAYMOND Rivet, MD;  [facility];  Service: Endoscopy;  Laterality: N/A;  2:00 - moved to 1:00 - Ann notified pt   LAPAROSCOPIC CHOLECYSTECTOMY  2005   pain.   LEAD INSERTION N/A 05/13/2024   Procedure: LEAD INSERTION;  Surgeon: Nancey Eulas BRAVO, MD;  Location: [facility];  Service: Cardiovascular;  Laterality: N/A;   ROTATOR CUFF REPAIR  2009   arthroscopic   TUBAL LIGATION     bilateral     SOCIAL HISTORY: Social History   Socioeconomic History   Marital status: Divorced    Spouse name: Not on file   Number of children: 3   Years of education: Not on file   Highest education level: Not on file  Occupational History   Occupation: post office clerk    Employer: [employer]  Tobacco Use   Smoking status: Never    Passive exposure: Never   Smokeless tobacco: Never  Vaping Use   Vaping status: Never Used  Substance and Sexual Activity   Alcohol use: No   Drug use: No   Sexual activity: Never    Birth control/protection: Surgical    Comment: hyst  Other Topics Concern   Not on file  Social History Narrative   Not on file   Social Drivers of Health   Financial Resource Strain: Not on file  Food Insecurity: No Food Insecurity (05/19/2024)   Hunger Vital Sign    Worried About Running Out of Food in the Last Year: Never true    Ran Out of Food in the Last Year: Never true  Transportation Needs: No Transportation Needs (05/19/2024)   PRAPARE - Administrator, Civil Service (Medical): No    Lack of Transportation (Non-Medical): No  Physical Activity: Not on file  Stress: Not on file  Social Connections: Moderately Integrated (05/19/2024)   Social Connection and Isolation Panel  Frequency of Communication with Friends and Family: More than three times a week    Frequency of Social Gatherings with Friends and Family: More than three times a week    Attends Religious Services: More than 4 times per year    Active Member of [employer] or Organizations: No    Attends Banker Meetings: 1 to 4 times per year    Marital Status: Never married  Intimate Partner Violence: Not At Risk (05/19/2024)   Humiliation, Afraid, Rape, and Kick questionnaire    Fear of Current or Ex-Partner: No    Emotionally Abused: No    Physically Abused: No    Sexually Abused: No    FAMILY HISTORY: Family History  Problem Relation Age of Onset   Stroke Mother     Heart attack Father     ALLERGIES:  is allergic to jardiance  [empagliflozin ] and glucophage  [metformin ].  MEDICATIONS:  Current Outpatient Medications  Medication Sig Dispense Refill   apixaban  (ELIQUIS ) 5 MG TABS tablet Take 1 tablet (5 mg total) by mouth 2 (two) times daily.     carvedilol  (COREG ) 12.5 MG tablet TAKE ONE TABLET BY MOUTH TWICE DAILY 180 tablet 2   digoxin  (LANOXIN ) 0.125 MG tablet Take 1 tablet (0.125 mg total) by mouth daily.     ENTRESTO  97-103 MG TAKE ONE TABLET BY MOUTH TWICE DAILY 60 tablet 6   Ferrous Sulfate (IRON PO) Take 1 tablet by mouth daily.     furosemide  (LASIX ) 40 MG tablet TAKE 40MG  DAILY, MAY TAKE ADDITIONAL DOSE OF LASIX  40MG  AS NEEDED FOR SWELLING (Patient taking differently: Take 40 mg by mouth in the morning and at bedtime.) 180 tablet 3   Ibuprofen-diphenhydrAMINE  Cit (ADVIL PM PO) Take 1 tablet by mouth at bedtime as needed (pain, sleep).     Multiple Vitamins-Minerals (WOMENS 50+ MULTI VITAMIN) TABS Take 1 tablet by mouth daily.     sitaGLIPtin (JANUVIA) 50 MG tablet Take 50 mg by mouth in the morning.     No current facility-administered medications for this visit.    REVIEW OF SYSTEMS:   Constitutional: ( - ) fevers, ( - )  chills , ( - ) night sweats Eyes: ( - ) blurriness of vision, ( - ) double vision, ( - ) watery eyes Ears, nose, mouth, throat, and face: ( - ) mucositis, ( - ) sore throat Respiratory: ( + ) cough, ( - ) dyspnea, ( - ) wheezes Cardiovascular: ( - ) palpitation, ( - ) chest discomfort, ( - ) lower extremity swelling Gastrointestinal:  ( - ) nausea, ( - ) heartburn, ( - ) change in bowel habits Skin: ( - ) abnormal skin rashes Lymphatics: ( - ) new lymphadenopathy, ( - ) easy bruising Neurological: ( - ) numbness, ( - ) tingling, ( - ) new weaknesses Behavioral/Psych: ( - ) mood change, ( - ) new changes  All other systems were reviewed with the patient and are negative.  PHYSICAL EXAMINATION: Not performed due to  virtual visit.  LABORATORY DATA:  I have reviewed the data as listed    Latest Ref Rng & Units 06/27/2024    9:27 AM 05/20/2024    2:31 AM 05/19/2024    9:42 AM  CBC  WBC 4.0 - 10.5 K/uL 9.8  13.4  14.4   Hemoglobin 12.0 - 15.0 g/dL 87.6  87.7  86.8   Hematocrit 36.0 - 46.0 % 35.7  35.9  38.0   Platelets 150 - 400 K/uL  211  161  196        Latest Ref Rng & Units 06/27/2024    9:27 AM 05/20/2024    2:31 AM 05/19/2024    9:42 AM  CMP  Glucose 70 - 99 mg/dL 733  847  669   BUN 8 - 23 mg/dL 12  25  24    Creatinine 0.44 - 1.00 mg/dL 8.90  8.75  8.47   Sodium 135 - 145 mmol/L 137  140  135   Potassium 3.5 - 5.1 mmol/L 3.5  3.9  3.8   Chloride 98 - 111 mmol/L 103  106  100   CO2 22 - 32 mmol/L 26  25  26    Calcium  8.9 - 10.3 mg/dL 9.3  9.4  9.6   Total Protein 6.5 - 8.1 g/dL 7.0     Total Bilirubin 0.0 - 1.2 mg/dL 0.6     Alkaline Phos 38 - 126 U/L 73     AST 15 - 41 U/L 20     ALT 0 - 44 U/L 21        RADIOGRAPHIC STUDIES: I have personally reviewed the radiological images as listed and agreed with the findings in the report. US  Soft Tissue Head/Neck Result Date: 07/11/2024 CLINICAL DATA:  Palpable left cervical lymph node EXAM: ULTRASOUND OF HEAD/NECK SOFT TISSUES TECHNIQUE: Ultrasound examination of the head and neck soft tissues was performed in the area of clinical concern. COMPARISON:  None Available. FINDINGS: Ultrasound performed of the left mid neck palpable abnormality/area of concern. This correlates with an enlarged indeterminate diffusely hypoechoic lymph node measuring 1.4 x 0.7 x 1.3 cm. This adenopathy is anterior to the left carotid artery. No additional surrounding soft tissue abnormality. IMPRESSION: Indeterminate left cervical adenopathy correlates with the palpable abnormality. Consider further evaluation with neck CT with contrast. Electronically Signed   By: CHRISTELLA.  Shick M.D.   On: 07/11/2024 10:36   CUP PACEART REMOTE DEVICE CHECK Result Date: 07/06/2024 ICD Scheduled  remote reviewed. Normal device function.  Presenting rhythm: AS-BIV paced. 1 AHR detection lasting 14 seconds. 1 VHR, EGM consistent with 1:1 AV, 22 beats.  HF diagnostics have been abnormal in this monitoring period. Next remote 91 days. - CS, CVRS   ASSESSMENT & PLAN JILLIAN WARTH is a 73 y.o. female who presents for virtual visit for continued management of leukocytosis.  #Leukocytosis, neutrophil predominant --Workup from 06/27/2024 showed that leukocytosis resolved with WBC 9.8, ANC 7.2.  Remaining workup showed no evidence of driver mutation to suggest myelo proliferative neoplasm and there was no evidence of BCR able gene fusion. -- No further hematologic workup is recommended at this time and advised patient to monitor for any changes to her blood counts in the future.  #Palpable left cervical lymph node: --Patient reports she had imaging done 40 years ago. No biopsy. --US  from 07/11/2024 indeterminate left cervical adenopathy measuring 1.4 x 0.7 x 1.3 cm with consideration for CT neck to further evaluate. Patient would like to monitor at this time due to chronic nature of the lymphadenopathy.  --Monitor for now and advise to follow up with lymph node enlarges or new areas of palpable lymphadenopathy is found.   #H/O DVT/PE provoked by CHF/pneumonia/sepsis: --Hospitalization from 07/29/2022 - 08/04/2022 with CHF, pneumonia and sepsis - CT angio chest (08/10/2022): Small left upper lobe segmental pulmonary artery emboli.  No right heart strain. - Ultrasound Doppler (08/11/2022): DVT in the right calf vein and in the left popliteal and peroneal veins.  Superficial thrombophlebitis of the right GSV at the level of the knee extending into some communicating superficial varicosities. - She has been on Eliquis  since diagnosis, currently 5 mg BID.  - Denies any history of miscarriages or prior DVT. - Doppler on 05/20/2023: Negative for DVT. - Hypercoagulable workup: Negative for LA/factor V  Leiden/prothrombin gene mutation. --Previously managed by Dr. Katragadda who recommended indefinite anticoagulation as patient has severe CHF and decreased functional status. Agree with recommendation to continue with indefinite anticoagulation.   Follow up: --RTC in 6 months with labs and visit.   No orders of the defined types were placed in this encounter.   All questions were answered. The patient knows to call the clinic with any problems, questions or concerns.  I have spent a total of 20 minutes minutes of non-face-to-face time, preparing to see the patient, counseling and educating the patient, documenting clinical information in the electronic health record, independently interpreting results and communicating results to the patient, and care coordination.   Johnston Police, PA-C Department of Hematology/Oncology [facility] at [facility]

## 2024-08-16 ENCOUNTER — Telehealth: Payer: Self-pay | Admitting: Cardiovascular Disease

## 2024-08-16 NOTE — Telephone Encounter (Signed)
 Pt had defibrillator installed in July. She is having soreness through her chest up to her collarbone. Please advise.     Pt c/o of Chest Pain: STAT if active CP, including tightness, pressure, jaw pain, radiating pain to shoulder/upper arm/back, CP unrelieved by Nitro. Symptoms reported of SOB, nausea, vomiting, sweating.  1. Are you having CP right now? Yes, soreness in chest up to collar bone     2. Are you experiencing any other symptoms (ex. SOB, nausea, vomiting, sweating)? SOB, but pt says she is always SOB    3. Is your CP continuous or coming and going? Continuous    4. Have you taken Nitroglycerin ? No    5. How long have you been experiencing CP? A few weeks     6. If NO CP at time of call then end call with telling Pt to call back or call 911 if Chest pain returns prior to return call from triage team.  Call transferred to triage.

## 2024-08-16 NOTE — Telephone Encounter (Signed)
 Spoke to patient. She states  she is having pain above incision site of her defibrillator.  The site is not swollen ,red or warm to the touch per patient. No fever noted. Patient states she has not taken blood pressure  or heart rate.   She states she is also short of breath. Patient dose not sound short of breath with the conversation. She is able to carry on a conversation with out stopping.  She is able to move her shoulder and arm with out discomfort .  Everyone says ,I should call . I did not call earlier I thought it would go away.   RN schedule an  appointment 08/23/24 with  A.Tillery PA-C. Informed patient symptoms become worse please go have it evaluated at the ER. Patient voiced understanding.

## 2024-08-22 NOTE — Progress Notes (Unsigned)
  Electrophysiology Office Note:   ID:  Summer Christensen, DOB [birth year], MRN [id number]  Primary Cardiologist: Vishnu P Mallipeddi, MD Electrophysiologist: Eulas FORBES Furbish, MD  {Click to update primary MD,subspecialty MD or APP then REFRESH:1}    History of Present Illness:   Summer Christensen is a 73 y.o. female with h/o Chronic systolic CHF and LBBB s/p ICD seen today for routine electrophysiology follow-up s/p Defibrillator implant.  Since last being seen in our clinic the patient reports doing ***.  she denies chest pain, palpitations, dyspnea, PND, orthopnea, nausea, vomiting, dizziness, syncope, edema, weight gain, or early satiety.    Review of systems complete and found to be negative unless listed in HPI.   EP Information / Studies Reviewed:    EKG is ordered today. Personal review as below.       ICD Interrogation-  reviewed in detail today,  See PACEART report.  Arrhythmia/Device History Abbott BIV ICD 05/13/2024 for CHF and LBBB    Physical Exam:   VS:  There were no vitals taken for this visit.   Wt Readings from Last 3 Encounters:  06/27/24 149 lb 4 oz (67.7 kg)  05/20/24 144 lb 1.6 oz (65.4 kg)  05/13/24 144 lb (65.3 kg)     GEN: No acute distress *** NECK: No JVD; No carotid bruits CARDIAC: {EPRHYTHM:28826}, no murmurs, rubs, gallops RESPIRATORY:  Clear to auscultation without rales, wheezing or rhonchi  ABDOMEN: Soft, non-tender, non-distended EXTREMITIES:  {EDEMA LEVEL:28147::No} edema; No deformity   ASSESSMENT AND PLAN:    Chronic systolic CHF  s/p Abbott CRT-D  NICM LBBB euvolemic today Stable on an appropriate medical regimen Normal ICD function See Pace Art report No changes today  Disposition:   Follow up with {EPPROVIDERS:28135::EP Team} {EPFOLLOW UP:28173}   Signed, Ozell Prentice Passey, PA-C

## 2024-08-23 ENCOUNTER — Ambulatory Visit: Attending: Student | Admitting: Student

## 2024-08-23 ENCOUNTER — Encounter: Payer: Self-pay | Admitting: Student

## 2024-08-23 VITALS — BP 142/90 | HR 81 | Resp 16 | Ht >= 80 in | Wt 153.0 lb

## 2024-08-23 DIAGNOSIS — I428 Other cardiomyopathies: Secondary | ICD-10-CM | POA: Diagnosis not present

## 2024-08-23 DIAGNOSIS — I447 Left bundle-branch block, unspecified: Secondary | ICD-10-CM | POA: Insufficient documentation

## 2024-08-23 DIAGNOSIS — I5022 Chronic systolic (congestive) heart failure: Secondary | ICD-10-CM | POA: Insufficient documentation

## 2024-08-23 NOTE — Patient Instructions (Signed)
 Medication Instructions:  No medication changes today. *If you need a refill on your cardiac medications before your next appointment, please call your pharmacy*  Lab Work: No labwork ordered today. If you have labs (blood work) drawn today and your tests are completely normal, you will receive your results only by: MyChart Message (if you have MyChart) OR A paper copy in the mail If you have any lab test that is abnormal or we need to change your treatment, we will call you to review the results.  Testing/Procedures: Echocardiogram was ordered today  Follow-Up: At [facility], you and your health needs are our priority.  As part of our continuing mission to provide you with exceptional heart care, our providers are all part of one team.  This team includes your primary Cardiologist (physician) and Advanced Practice Providers or APPs (Physician Assistants and Nurse Practitioners) who all work together to provide you with the care you need, when you need it.  Your next appointment:   6 month(s)  Provider:   You may see Eulas FORBES Furbish, MD or one of the following Advanced Practice Providers on your designated Care Team:   Charlies Arthur, PA-C Ramel Tobon Andy Hunt Zajicek, PA-C Suzann Riddle, NP Daphne Barrack, NP    We recommend signing up for the patient portal called MyChart.  Sign up information is provided on this After Visit Summary.  MyChart is used to connect with patients for Virtual Visits (Telemedicine).  Patients are able to view lab/test results, encounter notes, upcoming appointments, etc.  Non-urgent messages can be sent to your provider as well.   To learn more about what you can do with MyChart, go to [email or web address].

## 2024-08-24 LAB — CUP PACEART INCLINIC DEVICE CHECK
Battery Remaining Longevity: 93 mo
Brady Statistic RA Percent Paced: 0.84 %
Brady Statistic RV Percent Paced: 99.83 %
Date Time Interrogation Session: 20251104094654
HighPow Impedance: 70.875
Implantable Lead Connection Status: 753985
Implantable Lead Connection Status: 753985
Implantable Lead Connection Status: 753985
Implantable Lead Implant Date: 20250725
Implantable Lead Implant Date: 20250725
Implantable Lead Implant Date: 20250725
Implantable Lead Location: 753858
Implantable Lead Location: 753859
Implantable Lead Location: 753860
Implantable Pulse Generator Implant Date: 20250725
Lead Channel Impedance Value: 437.5 Ohm
Lead Channel Impedance Value: 462.5 Ohm
Lead Channel Impedance Value: 487.5 Ohm
Lead Channel Pacing Threshold Amplitude: 0.5 V
Lead Channel Pacing Threshold Amplitude: 0.625 V
Lead Channel Pacing Threshold Amplitude: 0.75 V
Lead Channel Pacing Threshold Pulse Width: 0.5 ms
Lead Channel Pacing Threshold Pulse Width: 0.5 ms
Lead Channel Pacing Threshold Pulse Width: 0.5 ms
Lead Channel Sensing Intrinsic Amplitude: 12 mV
Lead Channel Sensing Intrinsic Amplitude: 5 mV
Lead Channel Setting Pacing Amplitude: 1 V
Lead Channel Setting Pacing Amplitude: 1.625
Lead Channel Setting Pacing Amplitude: 1.75 V
Lead Channel Setting Pacing Pulse Width: 0.5 ms
Lead Channel Setting Pacing Pulse Width: 0.5 ms
Lead Channel Setting Sensing Sensitivity: 0.5 mV
Pulse Gen Serial Number: 211050435
Zone Setting Status: 755011

## 2024-08-25 ENCOUNTER — Ambulatory Visit: Payer: Self-pay | Admitting: Cardiovascular Disease

## 2024-08-31 ENCOUNTER — Other Ambulatory Visit (HOSPITAL_COMMUNITY): Payer: Self-pay

## 2024-08-31 ENCOUNTER — Ambulatory Visit (HOSPITAL_COMMUNITY)
Admission: RE | Admit: 2024-08-31 | Discharge: 2024-08-31 | Disposition: A | Discharge status: Home / Self Care | Source: Ambulatory Visit | Attending: Student | Admitting: Student

## 2024-08-31 DIAGNOSIS — I5022 Chronic systolic (congestive) heart failure: Secondary | ICD-10-CM | POA: Insufficient documentation

## 2024-08-31 LAB — ECHOCARDIOGRAM COMPLETE
Area-P 1/2: 2.89 cm2
S' Lateral: 4 cm

## 2024-08-31 NOTE — Progress Notes (Signed)
*  PRELIMINARY RESULTS* Echocardiogram 2D Echocardiogram has been performed.  Summer Christensen 08/31/2024, 2:00 PM

## 2024-09-01 ENCOUNTER — Ambulatory Visit: Admitting: Cardiovascular Disease

## 2024-09-01 NOTE — Progress Notes (Deleted)
  Electrophysiology Office Note:    Date:  09/01/2024   ID:  [name], [name] 24-Feb-1951, MRN [id number]  PCP:  [facility]   [facility] Providers Cardiologist:  Diannah SHAUNNA Maywood, MD Electrophysiologist:  Eulas FORBES Furbish, MD     Referring MD: [facility]*   History of Present Illness:    Summer Christensen is a 73 y.o. female with a medical history significant for hypertension, nonischemic cardiomyopathy with EF 20 to 25%, pulmonary embolism referred to discuss ICD placement.     She was diagnosed with heart failure in October 2023 when admitted for septic shock secondary to pneumonia.  Echocardiogram showed an EF of 20 to 25%.  This was complicated by a right lower extremity DVT and left upper lobe pulmonary embolism.  Eliquis  was started and she has been following up in general cardiology clinic with Dr. Adelfa Gaunt and heart failure clinic with Dr. Gardenia.     Today, she reports that she continues to be fatigued and have very low energy.  EKGs/Labs/Other Studies Reviewed Today:    Echocardiogram:   TTE March 29, 2024 Ejection fraction 25 to 30%.  Grade 1 diastolic dysfunction.  TTE Feb 20, 2023 Ejection fraction 20 to 25% with dyssynchrony due to left bundle branch block  August 11, 2022 Ejection fraction 20 to 25%  TTE July 29, 2022 EF less than 20%.  Grade 3 diastolic dysfunction mild to moderate mitral regurgitation.   Monitors:   Stress testing:   Advanced imaging:  Cardiac MRI November 2024 LVEF 26%.   Coronary CTA September 23, 2022 1. Coronary calcium  score of 53.8. This was 69 percentile for age-, sex, and race-matched controls.  2. Normal coronary origin with right dominance.  3. Mild (25-49) calcified plaque in the ostial LAD.  4. Aortic atherosclerosis.  5. Left ventricular enlargement.  Cardiac catherization    EKG:         Physical Exam:    VS:  There were no vitals taken for  this visit.    Wt Readings from Last 3 Encounters:  08/23/24 153 lb (69.4 kg)  06/27/24 149 lb 4 oz (67.7 kg)  05/20/24 144 lb 1.6 oz (65.4 kg)     GEN:  Well nourished, well developed in no acute distress CARDIAC: RRR, no murmurs, rubs, gallops RESPIRATORY:  Normal work of breathing MUSCULOSKELETAL: no edema    ASSESSMENT & PLAN:    CHFrEF, nonischemic cardiomyopathy Likely left bundle branch block induced EF was <30% despite GDMT, now improved to 40-45% with CRT Continue carvedilol  12.5, digoxin  0.125, Entresto  97-103, spironolactone  25     Abbott CRT defibrillator  I reviewed today's device interrogation.  See Paceart for details Device is functioning normally  History of pulmonary embolism and DVTs Continue apixaban  5   Signed, Eulas FORBES Furbish, MD  09/01/2024 8:14 AM    [facility]

## 2024-09-30 ENCOUNTER — Encounter

## 2024-10-05 ENCOUNTER — Ambulatory Visit

## 2024-10-05 DIAGNOSIS — I447 Left bundle-branch block, unspecified: Secondary | ICD-10-CM

## 2024-10-06 NOTE — Progress Notes (Signed)
 Remote ICD Transmission

## 2024-11-18 ENCOUNTER — Other Ambulatory Visit: Payer: Self-pay | Admitting: Internal Medicine

## 2024-11-18 NOTE — Telephone Encounter (Signed)
 In accordance with refill protocols, please review and address the following requirements before this medication refill can be authorized:  Labs    Cr in normal range and within 365 days 06/27/24 (1.09)

## 2024-12-30 ENCOUNTER — Encounter

## 2025-01-04 ENCOUNTER — Encounter

## 2025-01-09 ENCOUNTER — Other Ambulatory Visit

## 2025-01-16 ENCOUNTER — Ambulatory Visit: Admitting: Physician Assistant

## 2025-03-27 ENCOUNTER — Other Ambulatory Visit

## 2025-03-31 ENCOUNTER — Encounter

## 2025-04-03 ENCOUNTER — Ambulatory Visit: Admitting: Physician Assistant

## 2025-04-05 ENCOUNTER — Encounter

## 2025-06-30 ENCOUNTER — Encounter
